# Patient Record
Sex: Male | Born: 1940 | Race: White | Hispanic: No | Marital: Married | Smoking: Former smoker
Health system: Southern US, Community
[De-identification: ages and names within clinical notes are randomized; demographics above are authoritative.]

## PROBLEM LIST (undated history)

## (undated) DIAGNOSIS — I739 Peripheral vascular disease, unspecified: Secondary | ICD-10-CM

## (undated) DIAGNOSIS — G4730 Sleep apnea, unspecified: Secondary | ICD-10-CM

## (undated) DIAGNOSIS — I2510 Atherosclerotic heart disease of native coronary artery without angina pectoris: Secondary | ICD-10-CM

## (undated) DIAGNOSIS — I499 Cardiac arrhythmia, unspecified: Secondary | ICD-10-CM

## (undated) DIAGNOSIS — R251 Tremor, unspecified: Secondary | ICD-10-CM

## (undated) DIAGNOSIS — I10 Essential (primary) hypertension: Secondary | ICD-10-CM

## (undated) DIAGNOSIS — M1990 Unspecified osteoarthritis, unspecified site: Secondary | ICD-10-CM

## (undated) DIAGNOSIS — T7840XA Allergy, unspecified, initial encounter: Secondary | ICD-10-CM

## (undated) DIAGNOSIS — J8410 Pulmonary fibrosis, unspecified: Secondary | ICD-10-CM

## (undated) HISTORY — DX: Unspecified osteoarthritis, unspecified site: M19.90

## (undated) HISTORY — PX: CORONARY ARTERY BYPASS GRAFT: SHX141

## (undated) HISTORY — DX: Essential (primary) hypertension: I10

## (undated) HISTORY — PX: EYE SURGERY: SHX253

## (undated) HISTORY — PX: LEG SURGERY: SHX1003

## (undated) HISTORY — PX: CARDIAC SURGERY: SHX584

## (undated) HISTORY — DX: Allergy, unspecified, initial encounter: T78.40XA

## (undated) HISTORY — PX: TONSILLECTOMY: SUR1361

---

## 1998-12-17 ENCOUNTER — Encounter: Payer: Self-pay | Admitting: Vascular Surgery

## 1998-12-18 ENCOUNTER — Encounter: Payer: Self-pay | Admitting: Vascular Surgery

## 1998-12-18 ENCOUNTER — Inpatient Hospital Stay: Admission: RE | Admit: 1998-12-18 | Discharge: 1998-12-20 | Payer: Self-pay | Admitting: Vascular Surgery

## 2003-07-16 ENCOUNTER — Inpatient Hospital Stay (HOSPITAL_COMMUNITY): Admission: EM | Admit: 2003-07-16 | Discharge: 2003-07-19 | Payer: Self-pay

## 2003-07-17 ENCOUNTER — Encounter: Payer: Self-pay | Admitting: Vascular Surgery

## 2004-02-25 ENCOUNTER — Ambulatory Visit (HOSPITAL_COMMUNITY): Admission: RE | Admit: 2004-02-25 | Discharge: 2004-02-25 | Payer: Self-pay | Admitting: Vascular Surgery

## 2004-07-07 ENCOUNTER — Ambulatory Visit (HOSPITAL_COMMUNITY): Admission: RE | Admit: 2004-07-07 | Discharge: 2004-07-07 | Payer: Self-pay | Admitting: Vascular Surgery

## 2006-05-21 ENCOUNTER — Ambulatory Visit: Payer: Self-pay

## 2006-07-27 ENCOUNTER — Ambulatory Visit: Payer: Self-pay | Admitting: Internal Medicine

## 2007-05-18 ENCOUNTER — Ambulatory Visit: Payer: Self-pay | Admitting: Vascular Surgery

## 2007-12-19 ENCOUNTER — Ambulatory Visit: Payer: Self-pay | Admitting: Internal Medicine

## 2009-05-01 ENCOUNTER — Ambulatory Visit: Payer: Self-pay | Admitting: Vascular Surgery

## 2009-05-07 ENCOUNTER — Ambulatory Visit: Payer: Self-pay | Admitting: Vascular Surgery

## 2010-03-27 ENCOUNTER — Ambulatory Visit: Payer: Self-pay | Admitting: Vascular Surgery

## 2010-04-24 ENCOUNTER — Ambulatory Visit: Payer: Self-pay | Admitting: Vascular Surgery

## 2010-10-05 ENCOUNTER — Ambulatory Visit: Payer: Self-pay | Admitting: Internal Medicine

## 2011-03-02 ENCOUNTER — Ambulatory Visit: Payer: Self-pay | Admitting: Unknown Physician Specialty

## 2011-04-21 NOTE — Procedures (Signed)
BYPASS GRAFT EVALUATION   INDICATION:  Follow-up evaluation of lower extremity bypass graft.   HISTORY:  Diabetes:  Yes.  Cardiac:  No.  Hypertension:  Yes.  Smoking:  Yes.  Previous Surgery:  Left distal SFA to below-knee popliteal artery bypass  graft on 07/17/2003, stent left distal SFA proximal to graft on  07/07/2004.   SINGLE LEVEL ARTERIAL EXAM                               RIGHT              LEFT  Brachial:                    120                135  Anterior tibial:             141                164  Posterior tibial:            129                152  Peroneal:  Ankle/brachial index:        1.04               1.21   PREVIOUS ABI:  Date: 05/19/2006  RIGHT:  1.03  LEFT:  1.12   LOWER EXTREMITY BYPASS GRAFT DUPLEX EXAM:   DUPLEX:  1. Triphasic duplex waveform noted within left superficial femoral to      popliteal artery bypass graft and native arteries.  2. 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   IMPRESSION:  1. Patent left superficial femoral to popliteal artery bypass graft      with no evidence of focal stenosis.  2. Normal bilateral lower extremity ABIs with triphasic and biphasic      Doppler waveforms.  3. A nonvascularized anechoic mass noted behind right knee measuring      1.89 x 2.19 cm, possibly Baker's cyst.   ___________________________________________  Judeth Cornfield. Scot Dock, M.D.   AC/MEDQ  D:  05/02/2009  T:  05/02/2009  Job:  [id number]

## 2011-04-21 NOTE — Assessment & Plan Note (Signed)
OFFICE VISIT   [name], [name]  DOB:  11-02-1941                                       05/18/2007  NATFT#:[id number]   I saw [name] in the office for continued followup of his left lower  extremity bypass graft.  He has an above-knee to below-knee bypass.  His  only complaint today, he has been having some aching pain in the right  leg along the lateral aspect and also some burning pain overlying some  varicose veins.  He has had no real calf or thigh claudication, he has  had no rest pain.  He has no history of non-healing wounds.   REVIEW OF SYSTEMS:  He wears home O2 for sleep apnea.  He has had no  chest pain or chest pressure.  He does have arthritis.   PHYSICAL EXAMINATION:  VITAL SIGNS:  Blood pressure 134/80, heart rate  81.  NECK:  I do not detect any carotid bruits.  LUNGS:  Clear bilaterally to auscultation.  CARDIAC:  He has a regular rate and rhythm.  EXTREMITIES:  He has palpable femoral, popliteal and pedal pulses  bilaterally.  He has hyperpigmentation consistent with chronic venous  insufficiency.  He has some varicose veins bilaterally.   I re-assured him that he has excellent arterial flow and I think his  pain in the right leg is from his chronic venous insufficiency.  We  discussed the importance of intermittent leg elevation and also  considered giving him some compression stockings, although currently,  because of the heat and  all, he was not interested in that.  I will plan to see him back in 1  year.  He knows to call sooner if he has problems.   Judeth Cornfield. Scot Dock, M.D.  Electronically Signed   CSD/MEDQ  D:  05/18/2007  T:  05/19/2007  Job:  [id number]

## 2011-04-21 NOTE — Assessment & Plan Note (Signed)
OFFICE VISIT   TAGG, EUSTICE  DOB:  [birth year]                                       05/07/2009  NGEXB#:[id number]   I saw the patient in the office today for continued followup of his  peripheral vascular disease.  He has undergone a previous bypass graft  in January of 2000 by myself which was a left above knee pop to below  knee pop bypass with a vein graft.  He presented in August of 2004 with  an occluded graft and underwent bypass in the distal superficial femoral  artery on the left to the below knee popliteal artery using vein from  the left leg.  This was by Dr. Donnetta Hutching.  He was last seen in June of 2008  and then got off of the protocol some how with respect to following his  bypass graft.  On 05/01/2009 he had a followup study which showed  triphasic waveforms throughout the superficial femoral artery and  popliteal artery and bypass graft on the left with an ABI of 100%  bilaterally.  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  He  also had a Baker's cyst on the right.   Since I saw him last he has had really no claudication, rest pain or  nonhealing ulcers.  He does have some aching in his leg when he is on  his feet all day likely related to his chronic venous insufficiency.  He  works in [employer] and is on his feet most of the day.   REVIEW OF SYSTEMS:  He has had no recent chest pain, chest pressure,  palpitations or arrhythmias.  He has had no productive cough,  bronchitis, asthma or wheezing.   PHYSICAL EXAMINATION:  This is a pleasant 70 year old gentleman who  appears his stated age.  His blood pressure is 134/80, heart rate is 57.  Neck is supple.  There is no cervical lymphadenopathy.  I do not detect  any carotid bruits.  Lungs are clear bilaterally to auscultation.  On  cardiac exam he has a regular rate and rhythm.  His abdomen is soft and  nontender.  He has palpable femoral, popliteal and pedal pulses  bilaterally.  He has hyperpigmentation bilaterally consistent with  chronic venous insufficiency.   With respect to his aching in his legs I have encouraged him to elevate  his legs when he gets home from work at night.  With respect to his  bypass graft in the left leg which has an aneurysmal area I have  recommend a followup duplex scan in 6 months and I will see him back at  that time.  If this enlarges we would have to consider arteriography and  possible revision although this would essentially mean a whole new  bypass graft.  We will see him back in 6 months.  He knows to call  sooner if he has problems.   Judeth Cornfield. Scot Dock, M.D.  Electronically Signed   CSD/MEDQ  D:  05/07/2009  T:  05/08/2009  Job:  [id number]

## 2011-04-24 NOTE — Op Note (Signed)
[name], [name]                        ACCOUNT NO.:  [id number]   MEDICAL RECORD NO.:  [id number]                   PATIENT TYPE:  OIB   LOCATION:  2899                                 FACILITY:  [facility]   PHYSICIAN:  Judeth Cornfield. Scot Dock, M.D.        DATE OF BIRTH:  24-Sep-1941   DATE OF PROCEDURE:  02/25/2004  DATE OF DISCHARGE:                                 OPERATIVE REPORT   PREOPERATIVE DIAGNOSIS:  Superficial femoral artery occlusive disease.   PROCEDURE:  Aortogram.  Bilateral iliac arteriogram and bilateral lower  extremity runoff.   INDICATIONS:  This is a pleasant 70 year old gentleman who has undergone  previous repair and subsequent revision of a left popliteal artery bypass  graft.  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  On a routine  followup study he was noted to have a slight drop in his ankle-brachial  index on the left from 100% to 90% and there was an area of increased  velocity just proximal to the proximal anastomosis and the superficial  femoral artery.  He was brought in for diagnostic arteriography and possible  angioplasty of the SFA stenosis.   DESCRIPTION OF PROCEDURE:  The patient was brought to the PD lab at [facility] and  sedated with 1 mg of Versed and 50 mcg of fentanyl.  Both groins were  prepped and draped in the usual sterile fashion.  After the skin was  anesthetized with 1% lidocaine, the right common femoral artery was  cannulated and the guide wire introduced into the infrarenal aorta under  fluoroscopic control.  A 5 French sheath was then passed over the wire and  the dilator removed.  The pigtail catheter was positioned at the L1  vertebral body and flush aortogram obtained.  The catheter was then  positioned above the aortic bifurcation and oblique iliac projections were  obtained.  Next, the pigtail catheter was exchanged for an IMA catheter  which  was positioned into the proximal left common iliac artery.  The wooly  wire was advanced down to the left common femoral artery and the IMA  catheter was exchanged for an __________  catheter.  Left lower extremity  runoff films were obtained through the __________  catheter.  In order to  get better visualization of the stenosis, I did advance the catheter over a  wire into the SFA below the common femoral artery bifurcation.  Next, the  catheter was removed and the right lower extremity films were obtained  through the right femoral sheath.   FINDINGS:  The are single renal arteries bilaterally with no significant  renal artery stenosis identified.  There is no significant narrowing of the  infrarenal aorta.  There is one area of mildly dilated right common iliac  artery but this is not frankly aneurysmal.  Common iliac arteries,  hypogastric arteries, and external iliac arteries were widely  patent  bilaterally.   On the left side the common femoral, superficial femoral, and deep femoral  arteries were widely patent.  There was an approximately 50% stenosis that  is irregular over an approximately 2-cm length just proximal to the proximal  anastomosis of the above-knee to below-knee popliteal artery bypass graft.  The vein graft itself is widely patent.  There appears to be a distal  anastomotic stenosis of about 30%.  There is three-vessel runoff on the  left.  On the right side, the common femoral, superficial femoral, and deep  femoral arteries were widely patent.  There was some mild irregularity at  the adductor canal and some slight ectasia of the popliteal artery.  Again,  it is not frankly aneurysmal.  There is three-vessel runoff on the right.   CONCLUSION:  A 50% superficial femoral artery stenosis above the vein bypass  graft on the left leg with a widely patent vein graft and a 30% distal  anastomotic stenosis.                                               Judeth Cornfield. Scot Dock, M.D.    CSD/MEDQ  D:  02/25/2004  T:  02/25/2004  Job:  [id number]

## 2011-04-24 NOTE — Op Note (Signed)
[name], [name]                        ACCOUNT NO.:  [id number]   MEDICAL RECORD NO.:  [id number]                   PATIENT TYPE:  INP   LOCATION:  2041                                 FACILITY:  [facility]   PHYSICIAN:  Rosetta Posner, M.D.                 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   PROCEDURE:  Bypass from distal superficial femoral artery to below knee  popliteal artery with reversed saphenous vein graft, left leg.   SURGEON:  Rosetta Posner, M.D.   ASSISTANT:  Suzzanne Cloud, P.A.  Lydia Guiles, P.A.   ANESTHESIA:  General endotracheal anesthesia.   COMPLICATIONS:  None.   CONDITION:  Stable.   PROCEDURE IN DETAIL:  The patient was taken to the operating room and placed  under general anesthesia where the area of the left groin and left leg were  prepped and draped in the usual sterile fashion.  An incision was made at  the prior below knee popliteal incision via the knee approach and carried  down to isolate the vein graft of the popliteal artery.  The artery was not  aneurysmal at this level.  The artery was opened at the prior anastomosis  and there was clot in the below knee popliteal artery.  This was removed  with the catheter being passed down to the level of the calf.  Excellent  back bleeding was encountered and no further thrombus was removed.  Attempts  were made to thrombectomize the vein graft.  There appeared to be no  stenosis in the vein graft.  For this reason, the decision was made to open  the prior above knee popliteal incision via medial approach and carried down  to  isolate the distal superficial femoral artery which had a good pulse  present.  Next, a groin incision was made and carried down to isolate the  saphenous vein which was of excellent caliber.  The vein was ligated at the  saphenofemoral junction and was exposed through several skin incisions down  to the mid thigh.  The vein was harvested, branches were occluded with 3-0  and 4-0 silk ties, and divided.  The vein was readily dilated with  heparinized saline and was of excellent caliber.  The patient was given 8000  units of intravenous  heparin prior to opening the below knee popliteal  artery.  The superficial femoral artery was ligated distally and occluded  proximally with a vascular clamp.  The artery was transected and the vein  was reversed and sewn end-to-end to  the artery with a running 5-0 Prolene  suture.  The anastomosis was tested and found to be adequate.  The graft was  then brought through a tunnel behind the level of the knee to the level of  the below knee popliteal artery.  The artery was transected and the vein was  cut to the appropriate length and sewn end-to-end to the artery with a  running 5-0 Prolene suture.  Prior to completion of the anastomosis, the  usual flush maneuvers were undertaken.  The anastomosis was then completed  and an excellent Doppler flow was noted in the foot.  The wounds were  irrigated with saline, hemostasis was obtained with  electrocautery.  The patient was given 50 mg protamine to reverse the  heparin.  The wounds were closed with 3-0 Vicryl in the subcutaneous and  subcuticular tissue, the skin was closed with skin clips.  A sterile  dressing was applied.  The patient was taken to the recovery room in stable  condition.                                               Rosetta Posner, M.D.    TFE/MEDQ  D:  07/17/2003  T:  07/17/2003  Job:  [id number]

## 2011-04-24 NOTE — Op Note (Signed)
NAMECAMRAN, [name]                        ACCOUNT NO.:  [id number]   MEDICAL RECORD NO.:  [id number]                   PATIENT TYPE:  OIB   LOCATION:  2899                                 FACILITY:  [facility]   PHYSICIAN:  Judeth Cornfield. Scot Dock, M.D.        DATE OF BIRTH:  02/19/1941   DATE OF PROCEDURE:  07/07/2004  DATE OF DISCHARGE:                                 OPERATIVE REPORT   OPERATION PERFORMED:  1. Bilateral lower extremity arteriograms.  2. Angioplasty of left superficial femoral artery with Powerflex 5 x 2     balloon.  3. Placement of 8 x 40 smart stent in the left superficial femoral artery.   SURGEON:  Judeth Cornfield. Scot Dock, M.D.   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  He had presented with  an acutely ischemic leg in January of 2000.  In August of 2004, the graft  occluded and he had it replaced with another segment of vein from the left  leg.  He has been followed since then and he has developed a stenosis  proximal to his graft.  The velocities within the stenoses have increased  and peak systolic velocity is now 992 cm per second.  Given the risk for  progression and graft thrombosis related to the proximal stenosis,  arteriography and possible angioplasty were recommended.  The procedure and  potential complications including but not limited to bleeding, arterial  injury, dye reaction, kidney failure, arterial dissection and restenosis  were discussed with the patient and all of his questions were answered and  he was agreeable to proceed.   ASSISTANT:  Nurse.   ANESTHESIA:   DESCRIPTION OF PROCEDURE:  The patient was taken to the peripheral vascular  lab at [facility] and sedated with 1 mg of Versed and 50 mcg of  fentanyl.  Both groins were prepped and draped in the usual sterile fashion.  After the skin was infiltrated with 1% lidocaine, the left  common femoral  artery was cannulated and a guidewire introduced into the infrarenal aorta.  The 5 French sheath was passed over the wire.  An IMA catheter was then  positioned into the proximal left common iliac artery, then the [facility] wire  was advanced down into the common femoral artery on the left.  The left  lower extremity runoff film was obtained demonstrating the tight stenosis  proximal to the popliteal to popliteal artery bypass graft and the mid-SFA.  I then exchanged the 5 [place] sheath for a 6 [place] Terumo sheath.  The  patient then received 4000 units if IV heparin.  The [facility] wire was then  advanced down to the stenosis and would not cross the stenosis.  I had to  use an angled Glide wire in order to traverse the stenosis in the left  superficial femoral artery.  Once I was past the stenosis,  repeat  arteriogram was obtained through the Terumo sheath using a [place]  marker.  The stenosis was then ballooned with a 5 x 2 PowerFlex balloon 14  atmospheres for 30 seconds and then 15 atmospheres for 30 seconds.  There  remained a residual waist despite this.  Therefore, given the residual  stenosis, I elected to place an 8 x 40 Smart stent.  The stent was  positioned across the stenosis after the balloon had been removed.  This was  a self expanding stent.  After this was placed, I went back with an 8 x 4  PowerFlex balloon to balloon within the stent and I did 6 atmospheres for 30  seconds and then eight atmospheres for 30 seconds with essentially no  residual stenosis.  Completion arteriogram was obtained.   FINDINGS:  The common femoral, superficial femoral and deep femoral artery  patent on the right side.  The popliteal artery was slightly ectatic but is  not frankly aneurysmal.  There was three vessel runoff on the right via the  anterior tibial, posterior tibial and peroneal arteries.  On the left side  the common femoral and deep femoral arteries were widely  patent.  The  proximal superficial femoral artery was patent.  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  The stenosis was successfully balloon  angioplastied as described above, runoff on the left demonstrates three  vessel runoff via the anterior tibial, posterior tibial and peroneal  arteries.  There is a very minimal 5 to 10% stenosis distal to the distal  anastomosis on the left side.  This did not appear to be significant.   CONCLUSION:  Successful stenting and angioplasty of the left superficial  femoral artery above the superficial femoral artery to popliteal artery  bypass graft.  No significant tibial occlusive disease bilaterally.                                               Judeth Cornfield. Scot Dock, M.D.    CSD/MEDQ  D:  07/07/2004  T:  07/07/2004  Job:  [id number]

## 2011-04-24 NOTE — H&P (Signed)
[name], [name]                        ACCOUNT NO.:  [id number]   MEDICAL RECORD NO.:  [id number]                   PATIENT TYPE:  INP   LOCATION:  2041                                 FACILITY:  [facility]   PHYSICIAN:  Rosetta Posner, M.D.                 DATE OF BIRTH:  07-10-1941   DATE OF ADMISSION:  07/16/2003  DATE OF DISCHARGE:                                HISTORY & PHYSICAL   CHIEF COMPLAINT:  Cold left foot.   HISTORY OF PRESENT ILLNESS:  The patient is a 70 year old white male with a  history of peripheral vascular disease.  He is well-known to [facility], as he has  undergone a previous left above-knee-to-below-knee popliteal bypass graft in  January of 2000 by Dr. Judeth Cornfield. Scot Dock for a left popliteal artery  aneurysm.  Around 5 p.m. last evening, he developed the acute onset of  heaviness in his left foot as well as coolness.  He has had some  intermittent numbness as well.  He denies specific pain, change in sensory  or motor function, rest pain, hip or thigh pain. He recalls that over the  past several weeks, he has had some cramping in his left calf with exertion  such as walking.  He has continued to have the heaviness and coolness  overnight and this morning, sought care at [facility] in [place].  On exam, he was found to have a cold left foot.  He was transferred to [facility] for further evaluation and treatment.  He was seen in the  emergency department by Dr. Arvilla Meres. Early and ankle brachial indices were  performed, which were greater than 1.0 on the right and 0.64 on the left.  It was noted that on his last visit with Dr. Scot Dock in the [place] office in  March of 2004, a Duplex study at that time showed an ABI of greater than 1.0  on the left and Duplex scanning of the graft showed that the graft was  patent with slight aneurysmal dilatation proximal to the graft.  Because of  these reasons, he was admitted for further evaluation and  treatment.   PAST MEDICAL HISTORY:  1. Hypertension.  2. Nasal polyps.  3. History of an acute left femoral thrombosis in January of 2000 while he     was in [place].  He was admitted to [facility] at that time     for thrombectomy.  It was found at that time that he had a left popliteal     artery aneurysm and he was seen by Dr. Scot Dock and treated for this     problem.   PAST SURGICAL HISTORY:  1. Left femoral thrombectomy in [place] in 2000.  2. Left above-knee-to-below-knee popliteal bypass graft with reversed     saphenous vein to repair a left popliteal artery aneurysm in January of     2000  by Dr. Scot Dock.  3. Nasal polypectomy in May of 2004.  4. Tonsillectomy as a child.   CURRENT MEDICATIONS:  (He does not have his medications with him, therefore,  the doses are not documented.)  1. Lotensin.  2. Toprol.  3. Hydrochlorothiazide.  4. Enteric-coated aspirin 81 mg daily.  5. Vitamin E.  6. Saw palmetto.   ALLERGIES:  He reports allergies to IODINE and SHELLFISH.  He also has an  allergy to IV CONTRAST, which causes swelling.   FAMILY HISTORY:  His father died of lung cancer.  He also had coronary  artery disease and diabetes mellitus.  He denies any other family history of  coronary artery disease, hypertension, diabetes, strokes, cancer, or COPD.   SOCIAL HISTORY:  He is married and lives in [place].  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  He previously  smoked 2 packs per day and smoked for 30 years.  He quit 18 years ago.  He  does report occasional alcohol use.   REVIEW OF SYSTEMS:  See history of present illness for pertinent positives  and negatives.  Also, he reports feeling tired a lot recently.  He has a  history of nasal polyps but had these removed in May and has done much  better.  He denies fevers, chills, weight loss, recent infections, TIA  symptoms, amaurosis fugax, dysphagia, syncope, weakness,  chest pain, heart  palpitations, shortness of breath, dyspnea on exertion, orthopnea,  paroxysmal nocturnal dyspnea, cough, abdominal pain, nausea, vomiting,  diarrhea, constipation, hematochezia, melena, dysuria, nocturia, hematuria,  lower extremity edema, anxiety, depression, intolerance to heat or cold.   PHYSICAL EXAMINATION:  VITAL SIGNS:  Blood pressure 118/73, pulse of 62 and  regular, respirations 20 and unlabored, temperature 97.3.  GENERAL:  This is a well-developed, well-nourished white male in no acute  distress.  HEENT:  Normocephalic, atraumatic.  Pupils are equal, round and reactive to  light and accommodation.  Extraocular movements intact.  Exam of the ears  and nose reveals no abnormalities.  Oropharynx is clear with upper dentures  in place.  NECK:  Neck supple without lymphadenopathy, thyromegaly or carotid bruits.  HEART:  Regular rate and rhythm without murmurs, rubs, or gallops.  LUNGS:  Lungs clear to auscultation.  ABDOMEN:  Abdomen soft, obese, nontender and nondistended with active bowel  sounds in all quadrants.  No masses or hepatosplenomegaly.  EXTREMITIES:  His left foot is slightly cooler than the right.  There is no  obvious ischemia or ulcerations.  His foot has sensation and motor is  intact.  He has diminished pulses on the left and barely palpable dorsalis  pedis pulse.  He does have Dopplers in the DP and PT on the left.  His right  dorsalis pedis is 2+, palpable, and 2+ PT, which is palpable.  His femoral  pulses are 1+ and symmetrical bilaterally.  His radial pulses are 3+ and  symmetrical.  He has a well-healed left above-knee-to-below-knee scar on the  left.  NEUROLOGIC:  Cranial nerves II-XII grossly intact.  GENITAL AND RECTAL:  Exams deferred.   LABORATORIES ON ADMISSION:  White blood count 7.4, hemoglobin 14.3,  hematocrit 41.5, platelets 166,000.  Sodium 139, potassium 3.7, BUN 15, creatinine 0.9, glucose 104.  PT 13.8, INR 1.1, PTT 33.   ABIs greater than  1.0 on the right and 0.64 on the left.   ASSESSMENT AND PLAN:  This is a 70 year old white male with  history of  peripheral vascular disease, who presents with an ischemic left lower  extremity.  Because of his history of contrast allergy, he will be  premedicated  tonight and will undergo angiography in the morning to further delineate his  anatomy.  He will possibly need to be taken to the operating room later in  the day for revascularization.  He will be started on intravenous heparin  this evening.      Suzzanne Cloud, P.A.                        Rosetta Posner, M.D.    GC/MEDQ  D:  07/16/2003  T:  07/17/2003  Job:  [id number]   cc:   Georgetta Haber  [address]  [place] [address]  Fax: [phone]

## 2011-04-24 NOTE — Discharge Summary (Signed)
NAMESHELLIE, GOETTL                        ACCOUNT NO.:  [id number]   MEDICAL RECORD NO.:  [id number]                   PATIENT TYPE:  INP   LOCATION:  2010                                 FACILITY:  [facility]   PHYSICIAN:  Rosetta Posner, M.D.                 DATE OF BIRTH:  03-Apr-1941   DATE OF ADMISSION:  07/16/2003  DATE OF DISCHARGE:  07/19/2003                                 DISCHARGE SUMMARY   PRINCIPAL ADMISSION DIAGNOSIS:  Ischemic left foot.   DISCHARGE DIAGNOSES:  1. Ischemic left foot.  2. Occluded left saphenous vein bypass from above-knee to below-knee     popliteal artery from prior aneurysm repair.  3. Hypertension.  4. History of nasal polyps.  5. History of acute left femoral thrombosis in 2001.  6. History of tobacco use.   PROCEDURES:  1. Aortogram with bilateral lower extremity runoff.  2. Left-superficial-femoral-artery-to-below-knee-popliteal-artery bypass     with reverse saphenous vein graft.   HISTORY:  The patient is a 70 year old white male who is well known to [facility].  He had previously undergone a left above-knee-to-below-knee-popliteal bypass  graft in January 2000 by Dr. Scot Dock for repair of popliteal artery  aneurysm.  On the day prior to admission, he developed acute onset heaviness  of his left foot with associated temperature change.  He was seen the  following morning at the [facility] and was found to have ischemic left  foot.  He was transferred to [facility] for further evaluation.   HOSPITAL COURSE:  On admission, he was noted to have a left ankle brachial  index of 0.64 with ischemic changes of his left toes.  He was seen by Dr.  Donnetta Hutching, and it was felt that his previous bypass was most likely occluded.  It was recommended he proceed with an arteriogram.  However, because of  history of significant contrast allergy, it was felt that he was stable  enough to premedicate and proceed the following morning with  arteriography.  The underwent aortogram with bilateral lower extremity runoff on August 10  and was found to have an occlusion of the left distal  superficial femoral  to popliteal artery.  Because of this, he was later taken to the operating  room that day by Dr. Donnetta Hutching and underwent femoral-to-below-knee-popliteal  bypass graft.  He tolerated the procedure well and was transferred to the  floor in stable condition.  Postoperatively, he has done well.  His ankle  brachial index has improved to greater than 1.0 on the left.  He has been  ambulating the halls with a rolling walker and has been doing very well.  He  has remained afebrile, and all vital signs have been stable.  He has a  strong dorsalis pedis pulse in his foot, pink, and well perfused.  It is  felt that he is ambulating well and has had no other problems  postoperatively, and he may be discharged home at this time.   DISCHARGE MEDICATIONS:  1. Tylox 1 to 2 q.4h. p.r.n. for pain.  2. Home dose Lotensin, Toprol, hydrochlorothiazide, enteric-coated aspirin,     vitamin E, and Saw Palmetto.   DISCHARGE INSTRUCTIONS:  1. He is to refrain from driving,  heavy lifting, or strenuous activity.  2. He may continue daily walking and use of incentive spirometer.  3. He may shower daily and clean his incisions with soap and water.  4. He will continue same preoperative diet.   FOLLOW UP:  He will return to [facility] office in one week for staple removal and  wound recheck.  He will then follow up with Dr. Donnetta Hutching in three weeks and  will ahve repeat ankle brachial indices at that time.  He is asked to call  in the interim if he experiences any problems or has questions.      Suzzanne Cloud, P.A.                        Rosetta Posner, M.D.    GC/MEDQ  D:  07/19/2003  T:  07/20/2003  Job:  [id number]   cc:   Georgetta Haber, M.D.  [employer]

## 2011-06-17 ENCOUNTER — Ambulatory Visit: Payer: Self-pay | Admitting: Internal Medicine

## 2012-01-11 ENCOUNTER — Encounter: Payer: Self-pay | Admitting: Sports Medicine

## 2012-01-19 ENCOUNTER — Inpatient Hospital Stay: Payer: Self-pay | Admitting: Vascular Surgery

## 2012-01-19 LAB — CREATININE, SERUM
Creatinine: 1.08 mg/dL (ref 0.60–1.30)
EGFR (African American): >60
EGFR (Non-African Amer.): >60

## 2012-01-19 LAB — CBC WITH DIFFERENTIAL/PLATELET
Basophil #: 0 10*3/uL (ref 0.0–0.1)
Basophil %: 0.1 %
Eosinophil #: 0 10*3/uL (ref 0.0–0.7)
Eosinophil %: 0 %
HCT: 42.5 % (ref 40.0–52.0)
HGB: 13.9 g/dL (ref 13.0–18.0)
Lymphocyte #: 0.7 10*3/uL — ABNORMAL LOW (ref 1.0–3.6)
Lymphocyte %: 8.1 %
MCH: 29.7 pg (ref 26.0–34.0)
MCHC: 32.7 g/dL (ref 32.0–36.0)
MCV: 91 fL (ref 80–100)
Monocyte #: 0.1 10*3/uL (ref 0.0–0.7)
Monocyte %: 1.6 %
Neutrophil #: 7.3 10*3/uL — ABNORMAL HIGH (ref 1.4–6.5)
Neutrophil %: 90.2 %
Platelet: 192 10*3/uL (ref 150–440)
RBC: 4.68 10*6/uL (ref 4.40–5.90)
RDW: 14.3 % (ref 11.5–14.5)
WBC: 8.1 10*3/uL (ref 3.8–10.6)

## 2012-01-19 LAB — BUN: BUN: 15 mg/dL (ref 7–18)

## 2012-01-20 LAB — CBC WITH DIFFERENTIAL/PLATELET
Basophil #: 0 10*3/uL (ref 0.0–0.1)
Basophil %: 0.1 %
Eosinophil #: 0 10*3/uL (ref 0.0–0.7)
Eosinophil %: 0 %
HCT: 38.1 % — ABNORMAL LOW (ref 40.0–52.0)
HGB: 12.6 g/dL — ABNORMAL LOW (ref 13.0–18.0)
Lymphocyte #: 1.1 10*3/uL (ref 1.0–3.6)
Lymphocyte %: 10.3 %
MCH: 29.8 pg (ref 26.0–34.0)
MCHC: 33 g/dL (ref 32.0–36.0)
MCV: 90 fL (ref 80–100)
Monocyte #: 0.7 10*3/uL (ref 0.0–0.7)
Monocyte %: 6.4 %
Neutrophil #: 8.6 10*3/uL — ABNORMAL HIGH (ref 1.4–6.5)
Neutrophil %: 83.2 %
Platelet: 172 10*3/uL (ref 150–440)
RBC: 4.21 10*6/uL — ABNORMAL LOW (ref 4.40–5.90)
RDW: 14.6 % — ABNORMAL HIGH (ref 11.5–14.5)
WBC: 10.3 10*3/uL (ref 3.8–10.6)

## 2012-01-20 LAB — BASIC METABOLIC PANEL
Anion Gap: 10 (ref 7–16)
Calcium, Total: 8.4 mg/dL — ABNORMAL LOW (ref 8.5–10.1)
Chloride: 104 mmol/L (ref 98–107)
Co2: 25 mmol/L (ref 21–32)
EGFR (African American): >60
Glucose: 155 mg/dL — ABNORMAL HIGH (ref 65–99)
Osmolality: 285 (ref 275–301)

## 2012-01-20 LAB — PROTIME-INR
INR: 1.2
Prothrombin Time: 15.8 secs — ABNORMAL HIGH (ref 11.5–14.7)

## 2012-01-20 LAB — CK TOTAL AND CKMB (NOT AT ARMC): CK-MB: 10 ng/mL — ABNORMAL HIGH (ref 0.5–3.6)

## 2012-01-20 LAB — APTT: Activated PTT: >160 secs (ref 23.6–35.9)

## 2012-01-21 LAB — CBC WITH DIFFERENTIAL/PLATELET
Basophil %: 0.2 %
Eosinophil #: 0.2 10*3/uL (ref 0.0–0.7)
Eosinophil %: 1.2 %
HGB: 12.6 g/dL — ABNORMAL LOW (ref 13.0–18.0)
MCV: 91 fL (ref 80–100)
Monocyte #: 1.2 10*3/uL — ABNORMAL HIGH (ref 0.0–0.7)
Monocyte %: 8.7 %
Neutrophil #: 8.6 10*3/uL — ABNORMAL HIGH (ref 1.4–6.5)
Neutrophil %: 64.3 %
Platelet: 158 10*3/uL (ref 150–440)
RBC: 4.23 10*6/uL — ABNORMAL LOW (ref 4.40–5.90)
RDW: 14.7 % — ABNORMAL HIGH (ref 11.5–14.5)
WBC: 13.3 10*3/uL — ABNORMAL HIGH (ref 3.8–10.6)

## 2012-01-21 LAB — BASIC METABOLIC PANEL
Calcium, Total: 8 mg/dL — ABNORMAL LOW (ref 8.5–10.1)
Chloride: 106 mmol/L (ref 98–107)
Co2: 26 mmol/L (ref 21–32)
Creatinine: 0.8 mg/dL (ref 0.60–1.30)
EGFR (African American): >60
EGFR (Non-African Amer.): >60
Glucose: 115 mg/dL — ABNORMAL HIGH (ref 65–99)
Osmolality: 288 (ref 275–301)

## 2012-01-21 LAB — APTT: Activated PTT: >160 secs (ref 23.6–35.9)

## 2012-01-22 LAB — CBC WITH DIFFERENTIAL/PLATELET
Basophil #: 0 10*3/uL (ref 0.0–0.1)
Basophil %: 0.4 %
Eosinophil %: 3 %
HGB: 11.8 g/dL — ABNORMAL LOW (ref 13.0–18.0)
Lymphocyte #: 2.5 10*3/uL (ref 1.0–3.6)
MCH: 29.7 pg (ref 26.0–34.0)
MCV: 89 fL (ref 80–100)
Monocyte %: 10 %
Neutrophil %: 61.1 %
Platelet: 144 10*3/uL — ABNORMAL LOW (ref 150–440)
RBC: 3.96 10*6/uL — ABNORMAL LOW (ref 4.40–5.90)
WBC: 9.9 10*3/uL (ref 3.8–10.6)

## 2012-01-22 LAB — BASIC METABOLIC PANEL
BUN: 15 mg/dL (ref 7–18)
Chloride: 101 mmol/L (ref 98–107)
EGFR (African American): >60
Potassium: 4 mmol/L (ref 3.5–5.1)
Sodium: 137 mmol/L (ref 136–145)

## 2012-01-22 LAB — PROTIME-INR
INR: 1.2
Prothrombin Time: 15.6 secs — ABNORMAL HIGH (ref 11.5–14.7)

## 2012-01-22 LAB — APTT: Activated PTT: 92.1 secs — ABNORMAL HIGH (ref 23.6–35.9)

## 2012-01-23 LAB — BASIC METABOLIC PANEL
BUN: 18 mg/dL (ref 7–18)
Calcium, Total: 7.9 mg/dL — ABNORMAL LOW (ref 8.5–10.1)
Chloride: 102 mmol/L (ref 98–107)
Creatinine: 1 mg/dL (ref 0.60–1.30)
EGFR (African American): >60
EGFR (Non-African Amer.): >60
Glucose: 111 mg/dL — ABNORMAL HIGH (ref 65–99)
Osmolality: 284 (ref 275–301)
Potassium: 4 mmol/L (ref 3.5–5.1)
Sodium: 141 mmol/L (ref 136–145)

## 2012-01-23 LAB — APTT: Activated PTT: 77.9 secs — ABNORMAL HIGH (ref 23.6–35.9)

## 2012-01-23 LAB — CBC WITH DIFFERENTIAL/PLATELET
Basophil %: 0.4 %
Eosinophil %: 3.4 %
HCT: 31.7 % — ABNORMAL LOW (ref 40.0–52.0)
HGB: 10.5 g/dL — ABNORMAL LOW (ref 13.0–18.0)
Lymphocyte %: 26.9 %
MCH: 29.9 pg (ref 26.0–34.0)
Monocyte #: 1 10*3/uL — ABNORMAL HIGH (ref 0.0–0.7)
Monocyte %: 9.6 %
Neutrophil %: 59.7 %
Platelet: 135 10*3/uL — ABNORMAL LOW (ref 150–440)
RBC: 3.51 10*6/uL — ABNORMAL LOW (ref 4.40–5.90)
WBC: 10.5 10*3/uL (ref 3.8–10.6)

## 2012-01-24 LAB — COMPREHENSIVE METABOLIC PANEL
Anion Gap: 10 (ref 7–16)
Bilirubin,Total: 0.6 mg/dL (ref 0.2–1.0)
Calcium, Total: 8.3 mg/dL — ABNORMAL LOW (ref 8.5–10.1)
Chloride: 102 mmol/L (ref 98–107)
Co2: 27 mmol/L (ref 21–32)
Creatinine: 0.83 mg/dL (ref 0.60–1.30)
EGFR (African American): >60
EGFR (Non-African Amer.): >60
Osmolality: 280 (ref 275–301)
Potassium: 4.1 mmol/L (ref 3.5–5.1)
SGPT (ALT): 40 U/L
Sodium: 139 mmol/L (ref 136–145)

## 2012-01-24 LAB — CBC WITH DIFFERENTIAL/PLATELET
Basophil #: 0 10*3/uL (ref 0.0–0.1)
Eosinophil #: 0.3 10*3/uL (ref 0.0–0.7)
HCT: 30.7 % — ABNORMAL LOW (ref 40.0–52.0)
HGB: 10.2 g/dL — ABNORMAL LOW (ref 13.0–18.0)
Lymphocyte %: 17.5 %
MCHC: 33.1 g/dL (ref 32.0–36.0)
Monocyte #: 0.8 10*3/uL — ABNORMAL HIGH (ref 0.0–0.7)
Monocyte %: 9.4 %
Neutrophil #: 5.6 10*3/uL (ref 1.4–6.5)
RDW: 14.5 % (ref 11.5–14.5)
WBC: 8.1 10*3/uL (ref 3.8–10.6)

## 2012-01-25 LAB — APTT: Activated PTT: 78 secs — ABNORMAL HIGH (ref 23.6–35.9)

## 2012-01-25 LAB — PROTIME-INR: Prothrombin Time: 13.5 secs (ref 11.5–14.7)

## 2012-01-26 LAB — CBC WITH DIFFERENTIAL/PLATELET
Basophil #: 0 10*3/uL (ref 0.0–0.1)
Basophil %: 0.2 %
Eosinophil #: 0.4 10*3/uL (ref 0.0–0.7)
Eosinophil %: 3.7 %
Lymphocyte #: 3.1 10*3/uL (ref 1.0–3.6)
Lymphocyte %: 29 %
MCHC: 32.8 g/dL (ref 32.0–36.0)
MCV: 91 fL (ref 80–100)
Monocyte #: 0.9 10*3/uL — ABNORMAL HIGH (ref 0.0–0.7)
Monocyte %: 8.6 %
Neutrophil #: 6.3 10*3/uL (ref 1.4–6.5)
Platelet: 190 10*3/uL (ref 150–440)
RBC: 3.61 10*6/uL — ABNORMAL LOW (ref 4.40–5.90)
RDW: 15.5 % — ABNORMAL HIGH (ref 11.5–14.5)
WBC: 10.7 10*3/uL — ABNORMAL HIGH (ref 3.8–10.6)

## 2012-01-26 LAB — APTT: Activated PTT: 62.2 secs — ABNORMAL HIGH (ref 23.6–35.9)

## 2012-01-26 LAB — PROTIME-INR
INR: 1.1
Prothrombin Time: 14.9 secs — ABNORMAL HIGH (ref 11.5–14.7)

## 2012-01-27 LAB — CBC WITH DIFFERENTIAL/PLATELET
Basophil #: 0 10*3/uL (ref 0.0–0.1)
Basophil %: 0.4 %
Eosinophil #: 0.4 10*3/uL (ref 0.0–0.7)
HCT: 32.3 % — ABNORMAL LOW (ref 40.0–52.0)
Lymphocyte #: 2.6 10*3/uL (ref 1.0–3.6)
Lymphocyte %: 26.4 %
MCH: 30.3 pg (ref 26.0–34.0)
MCHC: 33.6 g/dL (ref 32.0–36.0)
MCV: 90 fL (ref 80–100)
Monocyte #: 1.1 10*3/uL — ABNORMAL HIGH (ref 0.0–0.7)
Neutrophil #: 5.8 10*3/uL (ref 1.4–6.5)
Platelet: 207 10*3/uL (ref 150–440)
RDW: 15.2 % — ABNORMAL HIGH (ref 11.5–14.5)

## 2012-01-27 LAB — PROTIME-INR: Prothrombin Time: 17.4 secs — ABNORMAL HIGH (ref 11.5–14.7)

## 2012-01-27 LAB — APTT: Activated PTT: 84.2 secs — ABNORMAL HIGH (ref 23.6–35.9)

## 2012-01-28 LAB — BASIC METABOLIC PANEL
Anion Gap: 9 (ref 7–16)
Calcium, Total: 8.7 mg/dL (ref 8.5–10.1)
Chloride: 103 mmol/L (ref 98–107)
Co2: 26 mmol/L (ref 21–32)
EGFR (African American): >60
Glucose: 110 mg/dL — ABNORMAL HIGH (ref 65–99)
Osmolality: 279 (ref 275–301)
Sodium: 138 mmol/L (ref 136–145)

## 2012-01-28 LAB — APTT: Activated PTT: 96.7 secs — ABNORMAL HIGH (ref 23.6–35.9)

## 2012-01-28 LAB — PROTIME-INR
INR: 1.7
Prothrombin Time: 20.3 secs — ABNORMAL HIGH (ref 11.5–14.7)

## 2012-01-28 LAB — PLATELET COUNT: Platelet: 209 10*3/uL (ref 150–440)

## 2012-01-29 LAB — PROTIME-INR: INR: 1.9

## 2012-01-29 LAB — APTT: Activated PTT: >160 secs (ref 23.6–35.9)

## 2012-11-05 ENCOUNTER — Ambulatory Visit: Payer: Self-pay

## 2012-11-24 ENCOUNTER — Encounter: Payer: Self-pay | Admitting: Vascular Surgery

## 2012-12-15 ENCOUNTER — Ambulatory Visit: Payer: Self-pay | Admitting: Ophthalmology

## 2012-12-21 ENCOUNTER — Ambulatory Visit: Payer: Self-pay | Admitting: Ophthalmology

## 2015-02-12 DIAGNOSIS — G4733 Obstructive sleep apnea (adult) (pediatric): Secondary | ICD-10-CM | POA: Insufficient documentation

## 2015-02-12 DIAGNOSIS — I340 Nonrheumatic mitral (valve) insufficiency: Secondary | ICD-10-CM | POA: Insufficient documentation

## 2015-02-26 DIAGNOSIS — M1711 Unilateral primary osteoarthritis, right knee: Secondary | ICD-10-CM | POA: Insufficient documentation

## 2015-02-26 DIAGNOSIS — M25561 Pain in right knee: Secondary | ICD-10-CM | POA: Insufficient documentation

## 2015-03-29 NOTE — Op Note (Signed)
PATIENT NAME:  [name], KORN MR#:  [id number] DATE OF BIRTH:  10-07-1941  DATE OF PROCEDURE:  12/21/2012  PROCEDURES PERFORMED: 1.  Pars plana vitrectomy of the left eye.  2.  Internal limiting membrane peel of the left eye.  3.  Phacoemulsification and intraocular lens insertion of the left eye.   PREOPERATIVE DIAGNOSES: 1.  Epiretinal membrane.  2.  Retinal edema.  3.  Visually significant cataract left eye.   POSTOPERATIVE DIAGNOSIS:  1.  Epiretinal membrane.  2.  Retinal edema.  3.  Visually significant cataract left eye.   ESTIMATED BLOOD LOSS: Less than 1 mL.  PRIMARY SURGEON:  Coralee Rud, M.D.   ANESTHESIA: Retrobulbar block of the left eye with monitored anesthesia care.   COMPLICATIONS: None.   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 The patient has developed a very significant epiretinal membrane and return of his edema despite maximum treatment. Through the course of treatment, the patient has also developed a visually significant cataract of the left eye. Risks, benefits and alternatives of the above procedure were discussed and the patient wished to proceed.   DETAILS OF PROCEDURE: After informed consent obtained, the patient was brought to the operative suite at [facility]. The patient was placed in supine position, was given a small dose of Alfenta and a retrobulbar block was performed on the left eye by the primary surgeon without complications. The left eye was prepped and draped in sterile manner. After lid speculum was inserted, a side-port wound was created at approximately 1:30. DisCoVisc was injected in the anterior chamber in order to maintain it. A keratome blade was used to create a main corneal wound at approximately 12:00 o'clock. A cystotome was used to create a continuous 360 degree anterior capsulorrhexis  without complication. The lens was hydrodissected using BSS. The lens was rotated for 180 degrees.  The phacoemulsification wand was introduced and the lens was cracked into 4 quadrants and removed without complication. INA was used to remove the remnant cortical material. DisCoVisc was injected into the capsular bag. A 22 diopter Technis ZCB00 lens, serial [id number], was injected into the capsular bag and rotated into position.  The DisCoVisc was removed using INA. A 10-0 nylon stitch was placed at the main corneal wound and the knot was rotated into position. The side-port wound was hydrated and the wounds were noted to be watertight and attention was turned to the pars plana vitrectomy portion of the case.   A 25-gauge trocar was placed inferotemporally through displaced conjunctiva in an oblique fashion 3 mm beyond the limbus. The infusion cannula was turned on and inserted through the trocar and secured into position with Steri-Strips. Two more trocars were placed in a similar fashion superotemporally and superonasally. Vitreous cutter and light pipe were introduced into the eye and a core vitrectomy was performed. The vitreous base was elevated off of the optic nerve using suction and the vitreous base was removed and the peripheral vitreous was trimmed for 360 degrees.  Indocyanine green was injected onto the posterior pole and removed within 30 seconds. The epiretinal membrane and internal limiting membrane were removed in toto for 360 degrees around the fovea. A scleral depressed exam was then performed for 360 degrees.  No signs of any breaks, tears or retinal detachment could be identified for 360 degrees.  A partial air-fluid exchange was performed and  the trocars were removed. The wounds were noted to be  airtight. 5 mg of dexamethasone was given into the inferior fornix. The pressure in the eye was confirmed to be approximately 10 to 15 mmHg. The lid speculum was removed and the eye was cleaned.  TobraDex was placed in the eye and a patch and shield were placed over the eye. The patient was taken to postanesthesia care with instructions to remain head up.      ____________________________ Teresa Pelton. Starling Manns, MD mfa:ct D: 12/21/2012 12:10:39 ET T: 12/21/2012 12:44:34 ET JOB#: [id number]  cc: Teresa Pelton. Starling Manns, MD, <Dictator> Coralee Rud MD ELECTRONICALLY SIGNED 12/28/2012 9:59

## 2015-03-31 NOTE — Op Note (Signed)
PATIENT NAME:  [name], [name] MR#:  [id number] DATE OF BIRTH:  01/18/1941  DATE OF PROCEDURE:  01/19/2012  PREOPERATIVE DIAGNOSES:  1. Atherosclerotic occlusive disease bilateral lower extremities with rest pain of the left lower extremity.  2. Thrombosis of left femoral popliteal bypass graft with ischemic left foot.  3. Popliteal artery aneurysm.  4. Abdominal aortic aneurysm.  5. Central venous stenosis.   POSTOPERATIVE DIAGNOSES: 1. Atherosclerotic occlusive disease bilateral lower extremities with rest pain of the left lower extremity.  2. Thrombosis of left femoral popliteal bypass graft with ischemic left foot.  3. Popliteal artery aneurysm.  4. Abdominal aortic aneurysm.  5. Central venous stenosis.   PROCEDURES PERFORMED: 1. Follow-up angiography through existing sheath.  2. Third order catheter placement with placement of catheter in the anterior tibial artery.  3. Third order catheter placement additional with placement of catheter into the posterior tibial artery.  4. Third order catheter placement with placement of catheter in the peroneal artery.  5. Percutaneous transluminal angioplasty of the peroneal artery to 3 mm.  6. Percutaneous transluminal angioplasty of the posterior tibial artery to 3 mm.  7. Percutaneous transluminal angioplasty of the superficial femoral and vein bypass graft to 8 mm.   PROCEDURE PERFORMED BY: Katha Cabal, MD    SEDATION: Versed 2 mg plus fentanyl 50 mcg administered IV. Continuous ECG, pulse oximetry, and cardiopulmonary monitoring was performed throughout the entire procedure by the interventional radiology nurse. Total sedation time was approximately 1.5 hours.   CONTRAST USED: Isovue 50 mL.   FLUORO TIME: Approximately 10 minutes.   INDICATIONS: Mr. Rusher is a 74 year old gentleman who presented with thrombosis of his fem-pop bypass graft and ischemia of the left lower extremity. Earlier in the day he underwent initial  attempts at salvage which were unsuccessful and he was initiated on TPA infusion. He has now been undergoing infusion at 1.5 mg of TPA per hour for approximately four hours and is being returned to Special Procedures for follow-up examination and potential intervention. Risks and benefits were reviewed. All questions answered. The patient agrees to proceed.   DESCRIPTION OF PROCEDURE: The patient is returned to Special Procedures and positioned supine. The infusion catheter and sheath in the right groin are prepped and draped in a sterile fashion. Steel coils are removed from the infusion catheter. Catheter is pulled back approximately 6 cm into the distal bypass and hand injection of contrast is utilized to demonstrate the distal runoff. Review demonstrates that essentially 97 to 99% of the thrombus has been removed or lysed. There is a lesion noted in the peroneal, there is slow flow in the anterior tibial, and there is a distinct focal stenosis in the posterior tibial which appears to be residual thrombus as well. There also appears to be a greater than 70% stenosis that looks to be a residual thrombus within the midportion of the bypass graft in the previously stented segment. There appears to be some retained minimal thrombus within the bypass and superficial femoral artery as well.   4000 units of heparin is given; later in addition 1000 was given. Catheter is then removed over an 0.014 wire and using an angled catheter and a variety of wires including a Glidewire and an 0.014 grand slam wire first the anterior tibial artery was engaged and injection by hand was used to demonstrate that the tibial artery was widely patent throughout its course and there does not appear to be any distal thrombus perhaps high resistance secondary to  spasm. No further interventions on the anterior tibial were undertaken. Catheter and wire were pulled back into the popliteal and the peroneal artery was engaged and again the  catheter was advanced. Hand injection of contrast demonstrated the peroneal was quite small distally, approximately 2 to 2.5 mm, and there appeared to be an occluded segment in the proximal one-third. Grand slam wire was introduced and a 3 x 20 balloon was advanced down to the level of the ankle and balloon dilatation was performed to 10 atmospheres for one minute. The balloon and wire were then pulled back into the popliteal and using combination of angle wires, glide wires, the posterior tibial artery was engaged. The focal stenosis was addressed with a 3 x 8 balloon, one inflation for one minute to 10 atmospheres was performed. Follow-up angiography from a catheter in the distal popliteal demonstrated complete resolution of the thrombus within the posterior tibial and rapid flow of contrast down to the foot. There was also improved flow of contrast in the anterior tibial. Peroneal remains occluded in the midportion. Having salvaged 2 out of 3 tibial vessels, the area of stenosis and probable residual thrombus within the vein bypass and previously stented segment was dressed with an 8 x 6 balloon. Four serial inflations were performed throughout the course extending up into the superficial femoral. These inflations were to 8 atmospheres. Follow-up angiography now from the sheath demonstrated fairly rapid flow of contrast from the common femoral down to the ankle. There were no focal stenoses noted. There are some areas of mild haziness that do not appear to limit flow and are consistent with some residual thrombus. By estimation I would say 97% or more of the initial thrombus burden has now been lysed and the patient is given a bolus of Integrilin and will be run on Integrilin drip overnight in an attempt to lyse the residual remaining thrombus and maintain patency. He will then be anticoagulated with heparin and Coumadin.   Sheath was pulled into the right external iliac. Oblique view was obtained, StarClose  device was deployed, and there were no immediate complications.   INTERPRETATION: As noted above, a significant amount of the thrombus was lysed. There were areas of concern in all three tibial vessels. Each tibial vessel was interrogated individually; no lesions noted in the anterior tibial; posterior tibial demonstrated a focal area that was treated very effectively with a 3 mm balloon; peroneal did not improve significantly after balloon angioplasty. Residual thrombus and apparent obstruction in the bypass graft itself was treated effectively with 8 mm inflations.   SUMMARY: Successful salvage with recanalization and thrombolysis of the left femoral popliteal bypass graft.   ____________________________ Katha Cabal, MD ggs:drc D: 01/20/2012 09:25:00 ET T: 01/20/2012 10:00:24 ET JOB#: [id number]  cc: Katha Cabal, MD, <Dictator> Adin Hector, MD Katha Cabal MD ELECTRONICALLY SIGNED 02/01/2012 10:33

## 2015-03-31 NOTE — Op Note (Signed)
PATIENT NAME:  [name], [name] MR#:  [id number] DATE OF BIRTH:  09/19/1941  DATE OF PROCEDURE:  01/19/2012  PREOPERATIVE DIAGNOSES:  1. 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  2. 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  3. Popliteal artery aneurysm.  4. Abdominal aortic aneurysm.  5. Central venous stenosis.   POSTOPERATIVE DIAGNOSES:  1. 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  2. 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  3. Popliteal artery aneurysm.  4. Abdominal aortic aneurysm.  5. Central venous stenosis.   PROCEDURES PERFORMED:  1. Abdominal aortogram.  2. Left lower extremity distal runoff, third order catheter placement, with additional third order.  3. Percutaneous transluminal angioplasty of the left SFA.  4. Mechanical thrombectomy using the AngioJet DVX device, left superficial femoral and popliteal arteries.  5. Infusion of TPA as a continuous therapy, left lower extremity.  6. Insertion of right IJ central line with ultrasound guidance.  7. 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   SURGEON: Hortencia Pilar, MD  SEDATION: Versed 4 mg plus fentanyl 200 mcg administered IV. In addition, 2 mg of Dilaudid was administered IV as well. Continuous ECG, pulse oximetry, and cardiopulmonary monitoring was performed throughout the entire procedure by the interventional radiology nurse. Total sedation time was 2 hours.    ACCESSES:  1. 6 French sheath right common femoral artery with ultrasound guidance.  2. Triple lumen catheter, right IJ.   CONTRAST USED: Isovue 72 mL.   FLUORO TIME: 10.5 minutes.   INDICATIONS: [name] is a 74 year old gentleman who presented to the office yesterday with the new complaint of pain in his right  lower extremity which has been present for about one week. It was acute in onset. Approximately one month ago he had been in the office and had received his ultrasound evaluation which demonstrated his bypass graft was patent. Duplex ultrasound yesterday demonstrated thrombosis of his graft. The risks and benefits for treatment were reviewed, all questions are answered, and the patient agrees to proceed.   DESCRIPTION OF PROCEDURE: The patient is brought to special procedures and placed in the supine position. After adequate sedation is achieved, both groins are prepped and draped in sterile fashion. 1% lidocaine is infiltrated in the soft tissues, in the right groin, and ultrasound is placed in a sterile sleeve. Ultrasound is utilized secondary to lack of appropriate landmarks and to avoid vascular injury. Under direct ultrasound visualization, the artery is identified. It is echolucent and pulsatile indicating patency. Image is recorded for the permanent record. Under direct ultrasound visualization, with a single pass, the microneedle is inserted into the anterior wall of the common femoral artery, microwire followed by microsheath, and J-wire followed by 5 French sheath and 5 French pigtail catheter. The pigtail catheter is positioned at the level of T12 and bolus injection of contrast is used to demonstrate the aorta, in the AP projection. Pigtail catheter is then repositioned to above the bifurcation. Bilateral oblique views of the pelvis are obtained. No abnormalities are noted, and a stiff angled Glidewire with the pigtail catheter are then used to cross the aortic bifurcation. The catheter is advanced down into the common femoral where bolus injection of contrast demonstrates very sluggish flow in the proximal SFA. The profunda femoris fills quite well. Straight slip catheter and Glidewire are  then negotiated into the SFA and the catheter and wire combination negotiated down to the stent. Hand injection  within the stent demonstrates thrombus within the stent from the mid SFA throughout the entire stent and extending into the tibioperoneal trunk, anterior tibial, and in fact the posterior tibial and peroneal all demonstrate thrombus within them. When compared with the previous run following Viabahn stent placement in May 2011, the trifurcation was widely patent.   Magic torque wire is then negotiated into the anterior tibial and 8 mg of TPA is reconstituted in 50 mL. The AngioJet DVX device is then opened and prepped on the field. It is then advanced down to the origin of the anterior tibial and using the pulsed spray method 8 mg of TPA is laced throughout the entire thrombosed segment of the popliteal into the proximal anterior tibial. This is then allowed to dwell for 30 minutes. Following this the AngioJet is once again engaged, this time with the aspiration mode, and multiple passes are made. A total of 130 mL of aspirate is obtained. Follow-up angiography demonstrates absolutely no improvement. On directed injection through the catheter there appears to be a stricture stenosis at the proximal leading edge of the stent and this is angioplastied to 7 mm. Distally in the vein graft, in the distal popliteal, this is also angioplastied to 7 mm once. Once again follow-up angiography demonstrated absolutely no flow. There is thrombus noted occluding all three tibial vessels, in their proximal one third, thrombus throughout the stent, popliteal and SFA. Catheter is then negotiated into the anterior tibial where an image is obtained which demonstrates the distal two thirds are patent. This represents third order catheter placement. The catheter is then redirected into the posterior tibial using the Glidewire and again at about the mid point of the posterior tibial, the artery is patent down to the foot filling the pedal arch and free of thrombus. Given the earlier image that demonstrated less thrombus in the anterior  tibial and more extensive thrombus in the posterior tibial, it was elected to place the infusion catheter into the posterior tibial. A 50 cm long infusion catheter is selected. It is advanced over the 0.035 wire. The core is then inserted and is flushed with heparinized saline. The sheath is flushed with heparinized saline.   The right neck is then prepped and draped in sterile fashion. Ultrasound is once again placed in a sterile sleeve. Jugular vein is identified. It is echolucent, homogeneous, and easily compressible. Under direct ultrasound visualization, after recording an image for the permanent record, the microneedle is inserted into the jugular vein without difficulty, single pass is made, and the wire advances quite easily. Microsheath is inserted and then the J-wire is advanced. J-wire will not advance past the level of the clavicle and, therefore, contrast is injected into the microsheath. This represents catheter placement into the central venous system and it demonstrated stricture and narrowing of the jugular vein proximally at the level of the innominate with pooling of contrast. There was no true extravasation. Pulling the sheath back several centimeters allowed for free aspiration of blood. Attempts at passing a wire under fluoroscopic guidance were successful and the microwire was advanced into the atrium without difficulty. Dilator is then passed over the wire and the triple lumen catheter was fed over the microwire without any problem, under fluoroscopic guidance. All three lumens aspirate and flush easily. The catheter is then secured to the skin of the neck with 2-0 silk, and a sterile  dressing is applied with a light pressure dressing. The patient tolerated the procedure without hemodynamic instability and will undergo continuous infusion of TPA, which was initiated at the conclusion. He will undergo 1.5 units/hour through the infusion wire and 400 units/hour of heparin through the sheath.    INTERPRETATION: Initial views of the aorta demonstrate aneurysmal dilatation, however, no hemodynamically significant stenosis is noted. Bilateral renal arteries are noted. No evidence of renal artery stenosis. The common, internal, and external iliac arteries are widely patent bilaterally.   The left common femoral and profunda femoris are widely patent. The superficial femoral artery is patent in its proximal one third but there is very sluggish flow. With catheter-directed angiography within the stented segment, there is thrombus located from the mid SFA throughout the entire stented portion and down into the distal popliteal. Tibioperoneal trunk, peroneal and posterior tibial are all completely occluded in their proximal one third. Anterior tibial demonstrates several centimeters of thrombus, but then appears patent. Later in the case, with a catheter in the anterior tibial, it is demonstrated be patent down to the foot. In a similar fashion, with the catheter directed into the posterior tibial, at the midportion, it becomes patent down to the foot.   Following TPA infusion with a 30 minute dwell and subsequent AngioJet there is absolutely no improvement in fact the tibial vessels appear somewhat worse. Following angioplasty of an apparent leading proximal stenosis, there is still no improvement in flow. Following insertion of the wire and initiation of TPA, the patient does note some improvement with a decrease in his foot pain from a 10/10 to a 2/10. The patient will undergo continuous TPA infusion and will be rechecked for approximately four hours.  ____________________________ Katha Cabal, MD ggs:slb D: 01/19/2012 12:57:54 ET T: 01/19/2012 13:46:54 ET JOB#: [id number]  cc: Katha Cabal, MD, <Dictator> Adin Hector, MD Katha Cabal MD ELECTRONICALLY SIGNED 02/01/2012 10:33

## 2015-03-31 NOTE — Discharge Summary (Signed)
PATIENT NAME:  [name], [name] MR#:  [id number] DATE OF BIRTH:  [birth year]  DATE OF ADMISSION:  01/19/2012 DATE OF DISCHARGE:  01/29/2012  DIAGNOSES:  1. Thrombosis of popliteal artery aneurysm.  2. Atrial fibrillation.  3. Atherosclerotic occlusive disease bilateral lower extremities.  4. Hypertension.  5. Venous insufficiency.  6. Lymphedema.   PROCEDURES PERFORMED:  1. Intraarterial lysis of thrombosed graft.  2. Angioplasty of the tibial vessels.  3. Mechanical thrombolysis.   CONSULTATIONS:  1. Dr. Netty Starring, medical management  2. Dr. Serafina Royals, Cardiology    HISTORY OF PRESENT ILLNESS: [name] is a 74 year old gentleman who presented to the office with an acutely ischemic left foot. He has a history of femoral to below knee popliteal bypass for treatment of a popliteal artery aneurysm and subsequent aneurysmal deterioration of his graft which was treated with stenting two years ago. Follow-up in the office demonstrated that his bypass was widely patent in January, however, he presented acutely the day of admission with a profound ischemia. He is admitted to the hospital for treatment.   HOSPITAL COURSE: On day of admission, he underwent angiography and lytic therapy both with TPA infusion as well as mechanical subsequent follow-up angiography. He underwent multiple interventions as described in the operative note. He did very well from this and had resolution of his thrombus and restoration of flow through his graft. He was subsequently transferred to the Intensive Care Unit where he was maintained on Integrilin drip overnight. Dr. Netty Starring was asked to see him the next day and manage his medical comorbidities. He did well for the first 48 hours but subsequently was found to be in atrial fibrillation on post angio day #3. He was being maintained on a heparin drip. He also was noted to have significant swelling of the left lower extremity which was quite concerning to the  family. He continued to improve, was subsequently continued on his anticoagulation converted to Coumadin, and on hospital day #10, postoperative day #10 he was found to be therapeutic. His leg was under good control as far as its edema and pain and he was felt fit for discharge.   DISPOSITION: He is discharged to home.   FOLLOW-UP: 1. He will follow-up in the office in 1 to 2 weeks.  2. He will follow-up with Dr. Nehemiah Massed as arranged.  3. He will follow-up with Dr. Netty Starring as arranged.   DIET: Regular.   NEW MEDICATIONS: Coumadin.  ACTIVITY: As tolerated with frequent periods of elevation.   ____________________________ Katha Cabal, MD ggs:drc D: 02/19/2012 13:16:03 ET T: 02/19/2012 13:26:19 ET JOB#: [id number]  cc: Katha Cabal, MD, <Dictator> Katha Cabal MD ELECTRONICALLY SIGNED 02/23/2012 12:21

## 2015-05-10 DIAGNOSIS — I10 Essential (primary) hypertension: Secondary | ICD-10-CM | POA: Insufficient documentation

## 2015-09-09 ENCOUNTER — Emergency Department
Admission: EM | Admit: 2015-09-09 | Discharge: 2015-09-09 | Disposition: A | Discharge status: Home / Self Care | Payer: PPO | Attending: Student | Admitting: Student

## 2015-09-09 ENCOUNTER — Emergency Department: Payer: PPO

## 2015-09-09 ENCOUNTER — Encounter: Payer: Self-pay | Admitting: Emergency Medicine

## 2015-09-09 DIAGNOSIS — W19XXXA Unspecified fall, initial encounter: Secondary | ICD-10-CM | POA: Diagnosis not present

## 2015-09-09 DIAGNOSIS — Y9301 Activity, walking, marching and hiking: Secondary | ICD-10-CM | POA: Diagnosis not present

## 2015-09-09 DIAGNOSIS — Y9289 Other specified places as the place of occurrence of the external cause: Secondary | ICD-10-CM | POA: Insufficient documentation

## 2015-09-09 DIAGNOSIS — S0990XA Unspecified injury of head, initial encounter: Secondary | ICD-10-CM | POA: Diagnosis not present

## 2015-09-09 DIAGNOSIS — S022XXA Fracture of nasal bones, initial encounter for closed fracture: Secondary | ICD-10-CM | POA: Diagnosis not present

## 2015-09-09 DIAGNOSIS — Y998 Other external cause status: Secondary | ICD-10-CM | POA: Insufficient documentation

## 2015-09-09 DIAGNOSIS — S0992XA Unspecified injury of nose, initial encounter: Secondary | ICD-10-CM | POA: Diagnosis present

## 2015-09-09 LAB — CBC
HCT: 39.5 % — ABNORMAL LOW (ref 40.0–52.0)
HEMOGLOBIN: 13.1 g/dL (ref 13.0–18.0)
MCH: 28.7 pg (ref 26.0–34.0)
MCHC: 33.2 g/dL (ref 32.0–36.0)
MCV: 86.3 fL (ref 80.0–100.0)
Platelets: 181 10*3/uL (ref 150–440)
RBC: 4.58 MIL/uL (ref 4.40–5.90)
RDW: 16.3 % — AB (ref 11.5–14.5)
WBC: 9.5 10*3/uL (ref 3.8–10.6)

## 2015-09-09 LAB — APTT: aPTT: 36 seconds (ref 24–36)

## 2015-09-09 LAB — PROTIME-INR
INR: 1.45
PROTHROMBIN TIME: 17.8 s — AB (ref 11.4–15.0)

## 2015-09-09 MED ORDER — BACITRACIN ZINC 500 UNIT/GM EX OINT
TOPICAL_OINTMENT | CUTANEOUS | Status: AC
  Administered 2015-09-09: 1
  Filled 2015-09-09: qty 1.8

## 2015-09-09 MED ORDER — OXYMETAZOLINE HCL 0.05 % NA SOLN
NASAL | Status: AC
  Administered 2015-09-09: 1 via NASAL
  Filled 2015-09-09: qty 15

## 2015-09-09 MED ORDER — OXYMETAZOLINE HCL 0.05 % NA SOLN
1.0000 | Freq: Once | NASAL | Status: AC
  Administered 2015-09-09: 1 via NASAL

## 2015-09-09 MED ORDER — TRAMADOL HCL 50 MG PO TABS
50.0000 mg | ORAL_TABLET | Freq: Once | ORAL | Status: AC
  Administered 2015-09-09: 50 mg via ORAL
  Filled 2015-09-09: qty 1

## 2015-09-09 MED ORDER — TRAMADOL HCL 50 MG PO TABS: 50.0000 mg | ORAL_TABLET | Freq: Four times a day (QID) | ORAL | Status: AC | PRN

## 2015-09-09 MED ORDER — AMOXICILLIN-POT CLAVULANATE 875-125 MG PO TABS
1.0000 | ORAL_TABLET | Freq: Once | ORAL | Status: AC
  Administered 2015-09-09: 1 via ORAL
  Filled 2015-09-09: qty 1

## 2015-09-09 MED ORDER — AMOXICILLIN-POT CLAVULANATE 875-125 MG PO TABS: 1.0000 | ORAL_TABLET | Freq: Two times a day (BID) | ORAL | Status: AC

## 2015-09-09 NOTE — ED Notes (Signed)
Pt tripped and fell , abrasion noted to bridge of nose and chin.  Abrasions to bilat knees.  Pt denies LOC, on eloquist

## 2015-09-09 NOTE — ED Notes (Signed)
Wounds to nose cleaned with NS and sterile gauze. Bacitracin applied. Patient tolerated well.

## 2015-09-09 NOTE — ED Provider Notes (Signed)
[facility] Emergency Department Provider Note  ____________________________________________  Time seen: Approximately 8:41 PM  I have reviewed the triage vital signs and the nursing notes.   HISTORY  Chief Complaint Fall    HPI [name]. is a 74 y.o. male history of COPD, paroxysmal atrial fibrillation on Eliquis who presents for evaluation of traumatic face, head, neck pain after a mechanical fall which occurred suddenly just prior to arrival. Patient reports that he was walking down an incline when he lost his footing, falling forward onto his palms and face and scraping his knees. No loss of consciousness. No vomiting. No vision change, numbness or weakness. Current severity of pain in his face is moderate. No modifying factors. Prior to today, he had been in his usual state of health. Pain has been constant since onset.  History reviewed. No pertinent past medical history.  There are no active problems to display for this patient.   History reviewed. No pertinent past surgical history.  Current Outpatient Rx  Name  Route  Sig  Dispense  Refill  . amoxicillin-clavulanate (AUGMENTIN) 875-125 MG tablet   Oral   Take 1 tablet by mouth 2 (two) times daily.   14 tablet   0     Allergies Review of patient's allergies indicates no known allergies.  History reviewed. No pertinent family history.  Social History Social History  Substance Use Topics  . Smoking status: Never Smoker   . Smokeless tobacco: None  . Alcohol Use: None    Review of Systems Constitutional: No fever/chills Eyes: No visual changes. ENT: No sore throat. Cardiovascular: Denies chest pain. Respiratory: Denies shortness of breath. Gastrointestinal: No abdominal pain.  No nausea, no vomiting.  No diarrhea.  No constipation. Genitourinary: Negative for dysuria. Musculoskeletal: Negative for back pain. Skin: Negative for rash. Neurological: Negative for  headaches, focal weakness or numbness.  10-point ROS otherwise negative.  ____________________________________________   PHYSICAL EXAM:  VITAL SIGNS: ED Triage Vitals  Enc Vitals Group     BP 09/09/15 1659 173/69 mmHg     Pulse Rate 09/09/15 1659 60     Resp 09/09/15 1659 18     Temp 09/09/15 1659 97.9 F (36.6 C)     Temp Source 09/09/15 1659 Oral     SpO2 09/09/15 1659 96 %     Weight 09/09/15 1659 250 lb (113.399 kg)     Height 09/09/15 1659 _0  (1.803 m)     Head Cir --      Peak Flow --      Pain Score --      Pain Loc --      Pain Edu? --      Excl. in [place]? --     Constitutional: Alert and oriented. Well appearing and in no acute distress. Eyes: Conjunctivae are normal. PERRL. EOMI. Head: Tenderness to palpation throughout the nose with ecchymosis, superficial hemostatic abrasions to the nose, ecchymosis surrounding the right eye. Nose: Small amount of blood in the anterior naris bilaterally. No nasal septal hematoma. Mouth/Throat: Mucous membranes are moist.  Oropharynx non-erythematous. Neck: No stridor. C-collar applied on arrival to room and ER. Cardiovascular: Normal rate, regular rhythm. Grossly normal heart sounds.  Good peripheral circulation. Respiratory: Normal respiratory effort.  No retractions. Lungs CTAB. Gastrointestinal: Soft and nontender. No distention. No abdominal bruits. No CVA tenderness. Genitourinary: Deferred Musculoskeletal: No lower extremity tenderness nor edema.  No joint effusions. Very small superficial abrasions to bilateral knees but full range  of motion at the knees without bony tenderness or deformity. Neurologic:  Normal speech and language. No gross focal neurologic deficits are appreciated. 5 out of 5 strength in bilateral upper and lower extremities, sensation intact to light touch throughout. Skin:  Skin is warm, dry and intact. No rash noted. Psychiatric: Mood and affect are normal. Speech and behavior are  normal.  ____________________________________________   LABS (all labs ordered are listed, but only abnormal results are displayed)  Labs Reviewed  CBC - Abnormal; Notable for the following:    HCT 39.5 (*)    RDW 16.3 (*)    All other components within normal limits  PROTIME-INR - Abnormal; Notable for the following:    Prothrombin Time 17.8 (*)    All other components within normal limits  APTT   ____________________________________________  EKG  None ____________________________________________  RADIOLOGY  CT head, CT C-spine, CT maxillofacial  IMPRESSION: 1. Bilateral nasal bone fractures. Probable fracture of the bony nasal septum. 2. Anterior facial soft tissue swelling with preseptal swelling in both orbits. 3. No definite acute cervical spine findings. There is mild depression of the superior endplate of C7 which is age indeterminate but without paraspinal edema or other definite acute features. 4. Multilevel cervical spondylosis. ____________________________________________   PROCEDURES  Procedure(s) performed: None  Critical Care performed: No  ____________________________________________   INITIAL IMPRESSION / ASSESSMENT AND PLAN / ED COURSE  Pertinent labs & imaging results that were available during my care of the patient were reviewed by me and considered in my medical decision making (see chart for details).  [name]. is a 74 y.o. male history of COPD, paroxysmal atrial fibrillation on Eliquis who presents for evaluation of traumatic face, head, neck pain after a mechanical fall which occurred suddenly just prior to arrival. On exam, he is nontoxic appearing and in no acute distress. He does have obvious trauma to the nose and the right periorbital region however actually movements are intact, pupils are equally reactive to light. He was complaining of some neck pains on arrival to ER room 13, c-collar was placed. CT head ordered in  triage shows bilateral nasal bone fractures with blood in the ethmoid sinuses, no acute intracranial process. He will likely require outpatient ENT follow-up as well as Augmentin for these. I'm awaiting formal CT maxillofacial and CT C-spine. We'll check CBC and coags because he is anticoagulated. We'll treat his pain.  ----------------------------------------- 10:21 PM on 09/09/2015 -----------------------------------------  CT c-spine with no definite acute fracture. There is a note of age indeterminate mild depression of the superior endplate of C7 without paraspinal edema or other definite acute features. The patient has no midline tenderness to palpation at this time. Specifically he has no tenderness associated with C7. I doubt any acute fracture. C-collar cleared CBC is unremarkable. INR 1.45. He received his last tetanus vaccine within the past 5 years. Discussed return precautions, need for close ECP and ENT follow-up and the patient and his wife at bedside are comfortable with the discharge plan.  ____________________________________________   FINAL CLINICAL IMPRESSION(S) / ED DIAGNOSES  Final diagnoses:  Nasal bone fracture, closed, initial encounter  Fall, initial encounter  Head injury, initial encounter      Joanne Gavel, MD 09/09/15 2223

## 2015-09-30 DIAGNOSIS — I4819 Other persistent atrial fibrillation: Secondary | ICD-10-CM | POA: Insufficient documentation

## 2015-09-30 DIAGNOSIS — I4821 Permanent atrial fibrillation: Secondary | ICD-10-CM | POA: Insufficient documentation

## 2015-11-05 DIAGNOSIS — G250 Essential tremor: Secondary | ICD-10-CM | POA: Insufficient documentation

## 2015-11-18 ENCOUNTER — Other Ambulatory Visit: Payer: Self-pay | Admitting: Physical Medicine and Rehabilitation

## 2015-11-18 DIAGNOSIS — M5416 Radiculopathy, lumbar region: Secondary | ICD-10-CM

## 2015-12-05 ENCOUNTER — Ambulatory Visit
Admission: RE | Admit: 2015-12-05 | Discharge: 2015-12-05 | Disposition: A | Discharge status: Home / Self Care | Payer: PPO | Source: Ambulatory Visit | Attending: Physical Medicine and Rehabilitation | Admitting: Physical Medicine and Rehabilitation

## 2015-12-05 DIAGNOSIS — M5416 Radiculopathy, lumbar region: Secondary | ICD-10-CM

## 2015-12-05 DIAGNOSIS — M545 Low back pain: Secondary | ICD-10-CM | POA: Diagnosis not present

## 2015-12-05 DIAGNOSIS — M4806 Spinal stenosis, lumbar region: Secondary | ICD-10-CM | POA: Diagnosis not present

## 2015-12-05 DIAGNOSIS — R200 Anesthesia of skin: Secondary | ICD-10-CM | POA: Diagnosis not present

## 2015-12-16 DIAGNOSIS — J449 Chronic obstructive pulmonary disease, unspecified: Secondary | ICD-10-CM | POA: Diagnosis not present

## 2015-12-16 DIAGNOSIS — G4730 Sleep apnea, unspecified: Secondary | ICD-10-CM | POA: Diagnosis not present

## 2016-01-09 DIAGNOSIS — G250 Essential tremor: Secondary | ICD-10-CM | POA: Diagnosis not present

## 2016-01-10 DIAGNOSIS — M5416 Radiculopathy, lumbar region: Secondary | ICD-10-CM | POA: Diagnosis not present

## 2016-01-10 DIAGNOSIS — M5136 Other intervertebral disc degeneration, lumbar region: Secondary | ICD-10-CM | POA: Diagnosis not present

## 2016-01-10 DIAGNOSIS — M4806 Spinal stenosis, lumbar region: Secondary | ICD-10-CM | POA: Diagnosis not present

## 2016-01-16 DIAGNOSIS — J449 Chronic obstructive pulmonary disease, unspecified: Secondary | ICD-10-CM | POA: Diagnosis not present

## 2016-01-16 DIAGNOSIS — G4730 Sleep apnea, unspecified: Secondary | ICD-10-CM | POA: Diagnosis not present

## 2016-01-22 DIAGNOSIS — L570 Actinic keratosis: Secondary | ICD-10-CM | POA: Diagnosis not present

## 2016-01-22 DIAGNOSIS — Z85828 Personal history of other malignant neoplasm of skin: Secondary | ICD-10-CM | POA: Diagnosis not present

## 2016-01-22 DIAGNOSIS — X32XXXA Exposure to sunlight, initial encounter: Secondary | ICD-10-CM | POA: Diagnosis not present

## 2016-01-22 DIAGNOSIS — L821 Other seborrheic keratosis: Secondary | ICD-10-CM | POA: Diagnosis not present

## 2016-01-22 DIAGNOSIS — Z08 Encounter for follow-up examination after completed treatment for malignant neoplasm: Secondary | ICD-10-CM | POA: Diagnosis not present

## 2016-02-04 DIAGNOSIS — I25810 Atherosclerosis of coronary artery bypass graft(s) without angina pectoris: Secondary | ICD-10-CM | POA: Diagnosis not present

## 2016-02-04 DIAGNOSIS — J069 Acute upper respiratory infection, unspecified: Secondary | ICD-10-CM | POA: Diagnosis not present

## 2016-02-04 DIAGNOSIS — I429 Cardiomyopathy, unspecified: Secondary | ICD-10-CM | POA: Diagnosis not present

## 2016-02-04 DIAGNOSIS — I480 Paroxysmal atrial fibrillation: Secondary | ICD-10-CM | POA: Diagnosis not present

## 2016-02-07 DIAGNOSIS — M5416 Radiculopathy, lumbar region: Secondary | ICD-10-CM | POA: Diagnosis not present

## 2016-02-07 DIAGNOSIS — M4806 Spinal stenosis, lumbar region: Secondary | ICD-10-CM | POA: Diagnosis not present

## 2016-02-07 DIAGNOSIS — M5136 Other intervertebral disc degeneration, lumbar region: Secondary | ICD-10-CM | POA: Diagnosis not present

## 2016-02-12 DIAGNOSIS — H1132 Conjunctival hemorrhage, left eye: Secondary | ICD-10-CM | POA: Diagnosis not present

## 2016-02-13 DIAGNOSIS — J449 Chronic obstructive pulmonary disease, unspecified: Secondary | ICD-10-CM | POA: Diagnosis not present

## 2016-02-13 DIAGNOSIS — G4730 Sleep apnea, unspecified: Secondary | ICD-10-CM | POA: Diagnosis not present

## 2016-02-14 DIAGNOSIS — I739 Peripheral vascular disease, unspecified: Secondary | ICD-10-CM | POA: Diagnosis not present

## 2016-02-14 DIAGNOSIS — I25810 Atherosclerosis of coronary artery bypass graft(s) without angina pectoris: Secondary | ICD-10-CM | POA: Diagnosis not present

## 2016-02-14 DIAGNOSIS — I480 Paroxysmal atrial fibrillation: Secondary | ICD-10-CM | POA: Diagnosis not present

## 2016-02-14 DIAGNOSIS — R001 Bradycardia, unspecified: Secondary | ICD-10-CM | POA: Diagnosis not present

## 2016-03-06 DIAGNOSIS — M1A9XX0 Chronic gout, unspecified, without tophus (tophi): Secondary | ICD-10-CM | POA: Diagnosis not present

## 2016-03-06 DIAGNOSIS — I25810 Atherosclerosis of coronary artery bypass graft(s) without angina pectoris: Secondary | ICD-10-CM | POA: Diagnosis not present

## 2016-03-06 DIAGNOSIS — R739 Hyperglycemia, unspecified: Secondary | ICD-10-CM | POA: Diagnosis not present

## 2016-03-06 DIAGNOSIS — E782 Mixed hyperlipidemia: Secondary | ICD-10-CM | POA: Diagnosis not present

## 2016-03-06 DIAGNOSIS — I481 Persistent atrial fibrillation: Secondary | ICD-10-CM | POA: Diagnosis not present

## 2016-03-13 DIAGNOSIS — I739 Peripheral vascular disease, unspecified: Secondary | ICD-10-CM | POA: Diagnosis not present

## 2016-03-13 DIAGNOSIS — I25810 Atherosclerosis of coronary artery bypass graft(s) without angina pectoris: Secondary | ICD-10-CM | POA: Diagnosis not present

## 2016-03-13 DIAGNOSIS — M1A9XX0 Chronic gout, unspecified, without tophus (tophi): Secondary | ICD-10-CM | POA: Diagnosis not present

## 2016-03-13 DIAGNOSIS — R739 Hyperglycemia, unspecified: Secondary | ICD-10-CM | POA: Diagnosis not present

## 2016-03-13 DIAGNOSIS — I10 Essential (primary) hypertension: Secondary | ICD-10-CM | POA: Diagnosis not present

## 2016-03-13 DIAGNOSIS — E782 Mixed hyperlipidemia: Secondary | ICD-10-CM | POA: Diagnosis not present

## 2016-03-13 DIAGNOSIS — M48061 Spinal stenosis, lumbar region without neurogenic claudication: Secondary | ICD-10-CM | POA: Insufficient documentation

## 2016-03-13 DIAGNOSIS — I480 Paroxysmal atrial fibrillation: Secondary | ICD-10-CM | POA: Diagnosis not present

## 2016-03-13 DIAGNOSIS — G250 Essential tremor: Secondary | ICD-10-CM | POA: Diagnosis not present

## 2016-03-13 DIAGNOSIS — G4733 Obstructive sleep apnea (adult) (pediatric): Secondary | ICD-10-CM | POA: Diagnosis not present

## 2016-03-13 DIAGNOSIS — J8410 Pulmonary fibrosis, unspecified: Secondary | ICD-10-CM | POA: Insufficient documentation

## 2016-03-13 DIAGNOSIS — I429 Cardiomyopathy, unspecified: Secondary | ICD-10-CM | POA: Diagnosis not present

## 2016-03-13 DIAGNOSIS — J449 Chronic obstructive pulmonary disease, unspecified: Secondary | ICD-10-CM | POA: Diagnosis not present

## 2016-03-15 DIAGNOSIS — G4730 Sleep apnea, unspecified: Secondary | ICD-10-CM | POA: Diagnosis not present

## 2016-03-15 DIAGNOSIS — J449 Chronic obstructive pulmonary disease, unspecified: Secondary | ICD-10-CM | POA: Diagnosis not present

## 2016-03-16 DIAGNOSIS — H35352 Cystoid macular degeneration, left eye: Secondary | ICD-10-CM | POA: Diagnosis not present

## 2016-03-23 DIAGNOSIS — M4806 Spinal stenosis, lumbar region: Secondary | ICD-10-CM | POA: Diagnosis not present

## 2016-03-23 DIAGNOSIS — M5416 Radiculopathy, lumbar region: Secondary | ICD-10-CM | POA: Diagnosis not present

## 2016-03-23 DIAGNOSIS — M5136 Other intervertebral disc degeneration, lumbar region: Secondary | ICD-10-CM | POA: Diagnosis not present

## 2016-04-02 DIAGNOSIS — G250 Essential tremor: Secondary | ICD-10-CM | POA: Diagnosis not present

## 2016-04-13 ENCOUNTER — Other Ambulatory Visit: Payer: Self-pay | Admitting: Specialist

## 2016-04-13 DIAGNOSIS — J8410 Pulmonary fibrosis, unspecified: Secondary | ICD-10-CM

## 2016-04-13 DIAGNOSIS — J449 Chronic obstructive pulmonary disease, unspecified: Secondary | ICD-10-CM | POA: Diagnosis not present

## 2016-04-13 DIAGNOSIS — G4733 Obstructive sleep apnea (adult) (pediatric): Secondary | ICD-10-CM | POA: Diagnosis not present

## 2016-04-14 DIAGNOSIS — G4730 Sleep apnea, unspecified: Secondary | ICD-10-CM | POA: Diagnosis not present

## 2016-04-14 DIAGNOSIS — J449 Chronic obstructive pulmonary disease, unspecified: Secondary | ICD-10-CM | POA: Diagnosis not present

## 2016-04-21 DIAGNOSIS — M4316 Spondylolisthesis, lumbar region: Secondary | ICD-10-CM | POA: Diagnosis not present

## 2016-04-21 DIAGNOSIS — M545 Low back pain: Secondary | ICD-10-CM | POA: Diagnosis not present

## 2016-04-21 DIAGNOSIS — M5416 Radiculopathy, lumbar region: Secondary | ICD-10-CM | POA: Diagnosis not present

## 2016-04-22 ENCOUNTER — Ambulatory Visit
Admission: RE | Admit: 2016-04-22 | Discharge: 2016-04-22 | Disposition: A | Discharge status: Home / Self Care | Payer: PPO | Source: Ambulatory Visit | Attending: Specialist | Admitting: Specialist

## 2016-04-22 DIAGNOSIS — Z951 Presence of aortocoronary bypass graft: Secondary | ICD-10-CM | POA: Diagnosis not present

## 2016-04-22 DIAGNOSIS — J8410 Pulmonary fibrosis, unspecified: Secondary | ICD-10-CM | POA: Insufficient documentation

## 2016-04-22 DIAGNOSIS — R911 Solitary pulmonary nodule: Secondary | ICD-10-CM | POA: Insufficient documentation

## 2016-04-22 DIAGNOSIS — J432 Centrilobular emphysema: Secondary | ICD-10-CM | POA: Diagnosis not present

## 2016-04-22 DIAGNOSIS — I2510 Atherosclerotic heart disease of native coronary artery without angina pectoris: Secondary | ICD-10-CM | POA: Diagnosis not present

## 2016-04-22 DIAGNOSIS — K449 Diaphragmatic hernia without obstruction or gangrene: Secondary | ICD-10-CM | POA: Diagnosis not present

## 2016-04-24 DIAGNOSIS — G4733 Obstructive sleep apnea (adult) (pediatric): Secondary | ICD-10-CM | POA: Diagnosis not present

## 2016-05-15 DIAGNOSIS — G4730 Sleep apnea, unspecified: Secondary | ICD-10-CM | POA: Diagnosis not present

## 2016-05-15 DIAGNOSIS — J449 Chronic obstructive pulmonary disease, unspecified: Secondary | ICD-10-CM | POA: Diagnosis not present

## 2016-05-25 DIAGNOSIS — J84112 Idiopathic pulmonary fibrosis: Secondary | ICD-10-CM | POA: Diagnosis not present

## 2016-05-25 DIAGNOSIS — G4734 Idiopathic sleep related nonobstructive alveolar hypoventilation: Secondary | ICD-10-CM | POA: Diagnosis not present

## 2016-05-25 DIAGNOSIS — R0602 Shortness of breath: Secondary | ICD-10-CM | POA: Diagnosis not present

## 2016-05-25 DIAGNOSIS — J449 Chronic obstructive pulmonary disease, unspecified: Secondary | ICD-10-CM | POA: Diagnosis not present

## 2016-05-25 DIAGNOSIS — R911 Solitary pulmonary nodule: Secondary | ICD-10-CM | POA: Diagnosis not present

## 2016-05-27 ENCOUNTER — Other Ambulatory Visit: Payer: Self-pay | Admitting: Specialist

## 2016-05-27 DIAGNOSIS — R911 Solitary pulmonary nodule: Secondary | ICD-10-CM

## 2016-05-27 DIAGNOSIS — R0602 Shortness of breath: Secondary | ICD-10-CM

## 2016-05-27 DIAGNOSIS — J84112 Idiopathic pulmonary fibrosis: Secondary | ICD-10-CM

## 2016-06-08 ENCOUNTER — Ambulatory Visit: Payer: PPO

## 2016-06-14 DIAGNOSIS — J449 Chronic obstructive pulmonary disease, unspecified: Secondary | ICD-10-CM | POA: Diagnosis not present

## 2016-06-14 DIAGNOSIS — G4730 Sleep apnea, unspecified: Secondary | ICD-10-CM | POA: Diagnosis not present

## 2016-06-15 DIAGNOSIS — I480 Paroxysmal atrial fibrillation: Secondary | ICD-10-CM | POA: Diagnosis not present

## 2016-06-15 DIAGNOSIS — E782 Mixed hyperlipidemia: Secondary | ICD-10-CM | POA: Diagnosis not present

## 2016-06-15 DIAGNOSIS — I25810 Atherosclerosis of coronary artery bypass graft(s) without angina pectoris: Secondary | ICD-10-CM | POA: Diagnosis not present

## 2016-06-15 DIAGNOSIS — I340 Nonrheumatic mitral (valve) insufficiency: Secondary | ICD-10-CM | POA: Diagnosis not present

## 2016-06-15 DIAGNOSIS — I429 Cardiomyopathy, unspecified: Secondary | ICD-10-CM | POA: Diagnosis not present

## 2016-06-15 DIAGNOSIS — G4733 Obstructive sleep apnea (adult) (pediatric): Secondary | ICD-10-CM | POA: Diagnosis not present

## 2016-06-15 DIAGNOSIS — I10 Essential (primary) hypertension: Secondary | ICD-10-CM | POA: Diagnosis not present

## 2016-06-15 DIAGNOSIS — I739 Peripheral vascular disease, unspecified: Secondary | ICD-10-CM | POA: Diagnosis not present

## 2016-06-15 DIAGNOSIS — R001 Bradycardia, unspecified: Secondary | ICD-10-CM | POA: Diagnosis not present

## 2016-06-23 DIAGNOSIS — M1711 Unilateral primary osteoarthritis, right knee: Secondary | ICD-10-CM | POA: Diagnosis not present

## 2016-06-23 DIAGNOSIS — M1712 Unilateral primary osteoarthritis, left knee: Secondary | ICD-10-CM | POA: Diagnosis not present

## 2016-06-23 DIAGNOSIS — G8929 Other chronic pain: Secondary | ICD-10-CM | POA: Diagnosis not present

## 2016-06-23 DIAGNOSIS — M25562 Pain in left knee: Secondary | ICD-10-CM | POA: Diagnosis not present

## 2016-07-15 DIAGNOSIS — J449 Chronic obstructive pulmonary disease, unspecified: Secondary | ICD-10-CM | POA: Diagnosis not present

## 2016-07-15 DIAGNOSIS — G4730 Sleep apnea, unspecified: Secondary | ICD-10-CM | POA: Diagnosis not present

## 2016-08-05 DIAGNOSIS — I739 Peripheral vascular disease, unspecified: Secondary | ICD-10-CM | POA: Diagnosis not present

## 2016-08-05 DIAGNOSIS — I10 Essential (primary) hypertension: Secondary | ICD-10-CM | POA: Diagnosis not present

## 2016-08-05 DIAGNOSIS — I714 Abdominal aortic aneurysm, without rupture: Secondary | ICD-10-CM | POA: Diagnosis not present

## 2016-08-05 DIAGNOSIS — M79609 Pain in unspecified limb: Secondary | ICD-10-CM | POA: Diagnosis not present

## 2016-08-05 DIAGNOSIS — I70213 Atherosclerosis of native arteries of extremities with intermittent claudication, bilateral legs: Secondary | ICD-10-CM | POA: Diagnosis not present

## 2016-08-05 DIAGNOSIS — I724 Aneurysm of artery of lower extremity: Secondary | ICD-10-CM | POA: Diagnosis not present

## 2016-08-05 DIAGNOSIS — E669 Obesity, unspecified: Secondary | ICD-10-CM | POA: Diagnosis not present

## 2016-08-05 DIAGNOSIS — I6523 Occlusion and stenosis of bilateral carotid arteries: Secondary | ICD-10-CM | POA: Diagnosis not present

## 2016-08-05 DIAGNOSIS — E785 Hyperlipidemia, unspecified: Secondary | ICD-10-CM | POA: Diagnosis not present

## 2016-08-05 DIAGNOSIS — I6529 Occlusion and stenosis of unspecified carotid artery: Secondary | ICD-10-CM | POA: Diagnosis not present

## 2016-08-05 DIAGNOSIS — I70262 Atherosclerosis of native arteries of extremities with gangrene, left leg: Secondary | ICD-10-CM | POA: Diagnosis not present

## 2016-08-14 DIAGNOSIS — M4316 Spondylolisthesis, lumbar region: Secondary | ICD-10-CM | POA: Diagnosis not present

## 2016-08-14 DIAGNOSIS — M545 Low back pain: Secondary | ICD-10-CM | POA: Diagnosis not present

## 2016-08-14 DIAGNOSIS — M4806 Spinal stenosis, lumbar region: Secondary | ICD-10-CM | POA: Diagnosis not present

## 2016-08-15 DIAGNOSIS — G4730 Sleep apnea, unspecified: Secondary | ICD-10-CM | POA: Diagnosis not present

## 2016-08-15 DIAGNOSIS — J449 Chronic obstructive pulmonary disease, unspecified: Secondary | ICD-10-CM | POA: Diagnosis not present

## 2016-09-11 DIAGNOSIS — M1A9XX0 Chronic gout, unspecified, without tophus (tophi): Secondary | ICD-10-CM | POA: Diagnosis not present

## 2016-09-11 DIAGNOSIS — R739 Hyperglycemia, unspecified: Secondary | ICD-10-CM | POA: Diagnosis not present

## 2016-09-11 DIAGNOSIS — Z125 Encounter for screening for malignant neoplasm of prostate: Secondary | ICD-10-CM | POA: Diagnosis not present

## 2016-09-11 DIAGNOSIS — I480 Paroxysmal atrial fibrillation: Secondary | ICD-10-CM | POA: Diagnosis not present

## 2016-09-11 DIAGNOSIS — I10 Essential (primary) hypertension: Secondary | ICD-10-CM | POA: Diagnosis not present

## 2016-09-11 DIAGNOSIS — I739 Peripheral vascular disease, unspecified: Secondary | ICD-10-CM | POA: Diagnosis not present

## 2016-09-11 DIAGNOSIS — I25810 Atherosclerosis of coronary artery bypass graft(s) without angina pectoris: Secondary | ICD-10-CM | POA: Diagnosis not present

## 2016-09-14 DIAGNOSIS — J449 Chronic obstructive pulmonary disease, unspecified: Secondary | ICD-10-CM | POA: Diagnosis not present

## 2016-09-14 DIAGNOSIS — G4730 Sleep apnea, unspecified: Secondary | ICD-10-CM | POA: Diagnosis not present

## 2016-09-18 DIAGNOSIS — E782 Mixed hyperlipidemia: Secondary | ICD-10-CM | POA: Diagnosis not present

## 2016-09-18 DIAGNOSIS — G250 Essential tremor: Secondary | ICD-10-CM | POA: Diagnosis not present

## 2016-09-18 DIAGNOSIS — M1A9XX0 Chronic gout, unspecified, without tophus (tophi): Secondary | ICD-10-CM | POA: Diagnosis not present

## 2016-09-18 DIAGNOSIS — M1712 Unilateral primary osteoarthritis, left knee: Secondary | ICD-10-CM | POA: Diagnosis not present

## 2016-09-18 DIAGNOSIS — I25810 Atherosclerosis of coronary artery bypass graft(s) without angina pectoris: Secondary | ICD-10-CM | POA: Diagnosis not present

## 2016-09-18 DIAGNOSIS — M47816 Spondylosis without myelopathy or radiculopathy, lumbar region: Secondary | ICD-10-CM | POA: Insufficient documentation

## 2016-09-18 DIAGNOSIS — I429 Cardiomyopathy, unspecified: Secondary | ICD-10-CM | POA: Diagnosis not present

## 2016-09-18 DIAGNOSIS — I480 Paroxysmal atrial fibrillation: Secondary | ICD-10-CM | POA: Diagnosis not present

## 2016-09-18 DIAGNOSIS — J449 Chronic obstructive pulmonary disease, unspecified: Secondary | ICD-10-CM | POA: Diagnosis not present

## 2016-09-18 DIAGNOSIS — I739 Peripheral vascular disease, unspecified: Secondary | ICD-10-CM | POA: Diagnosis not present

## 2016-09-18 DIAGNOSIS — G4733 Obstructive sleep apnea (adult) (pediatric): Secondary | ICD-10-CM | POA: Diagnosis not present

## 2016-09-18 DIAGNOSIS — R739 Hyperglycemia, unspecified: Secondary | ICD-10-CM | POA: Diagnosis not present

## 2016-09-18 DIAGNOSIS — I10 Essential (primary) hypertension: Secondary | ICD-10-CM | POA: Diagnosis not present

## 2016-10-15 DIAGNOSIS — J449 Chronic obstructive pulmonary disease, unspecified: Secondary | ICD-10-CM | POA: Diagnosis not present

## 2016-10-15 DIAGNOSIS — G4730 Sleep apnea, unspecified: Secondary | ICD-10-CM | POA: Diagnosis not present

## 2016-10-20 DIAGNOSIS — I340 Nonrheumatic mitral (valve) insufficiency: Secondary | ICD-10-CM | POA: Diagnosis not present

## 2016-10-20 DIAGNOSIS — E782 Mixed hyperlipidemia: Secondary | ICD-10-CM | POA: Diagnosis not present

## 2016-10-20 DIAGNOSIS — R001 Bradycardia, unspecified: Secondary | ICD-10-CM | POA: Diagnosis not present

## 2016-10-20 DIAGNOSIS — I10 Essential (primary) hypertension: Secondary | ICD-10-CM | POA: Diagnosis not present

## 2016-10-20 DIAGNOSIS — I480 Paroxysmal atrial fibrillation: Secondary | ICD-10-CM | POA: Diagnosis not present

## 2016-10-20 DIAGNOSIS — I25810 Atherosclerosis of coronary artery bypass graft(s) without angina pectoris: Secondary | ICD-10-CM | POA: Diagnosis not present

## 2016-10-20 DIAGNOSIS — G4733 Obstructive sleep apnea (adult) (pediatric): Secondary | ICD-10-CM | POA: Diagnosis not present

## 2016-10-20 DIAGNOSIS — I429 Cardiomyopathy, unspecified: Secondary | ICD-10-CM | POA: Diagnosis not present

## 2016-11-12 ENCOUNTER — Ambulatory Visit: Payer: PPO

## 2016-11-14 DIAGNOSIS — J449 Chronic obstructive pulmonary disease, unspecified: Secondary | ICD-10-CM | POA: Diagnosis not present

## 2016-11-14 DIAGNOSIS — G4730 Sleep apnea, unspecified: Secondary | ICD-10-CM | POA: Diagnosis not present

## 2016-11-18 DIAGNOSIS — Z86010 Personal history of colonic polyps: Secondary | ICD-10-CM | POA: Diagnosis not present

## 2016-11-19 ENCOUNTER — Ambulatory Visit: Payer: PPO

## 2016-12-03 ENCOUNTER — Ambulatory Visit
Admission: RE | Admit: 2016-12-03 | Discharge: 2016-12-03 | Disposition: A | Discharge status: Home / Self Care | Payer: PPO | Source: Ambulatory Visit | Attending: Specialist | Admitting: Specialist

## 2016-12-03 DIAGNOSIS — R590 Localized enlarged lymph nodes: Secondary | ICD-10-CM | POA: Diagnosis not present

## 2016-12-03 DIAGNOSIS — I700 Atherosclerosis of aorta: Secondary | ICD-10-CM | POA: Insufficient documentation

## 2016-12-03 DIAGNOSIS — J84112 Idiopathic pulmonary fibrosis: Secondary | ICD-10-CM | POA: Diagnosis not present

## 2016-12-03 DIAGNOSIS — K449 Diaphragmatic hernia without obstruction or gangrene: Secondary | ICD-10-CM | POA: Insufficient documentation

## 2016-12-03 DIAGNOSIS — R911 Solitary pulmonary nodule: Secondary | ICD-10-CM | POA: Diagnosis not present

## 2016-12-03 DIAGNOSIS — R0602 Shortness of breath: Secondary | ICD-10-CM | POA: Insufficient documentation

## 2016-12-03 DIAGNOSIS — I2510 Atherosclerotic heart disease of native coronary artery without angina pectoris: Secondary | ICD-10-CM | POA: Insufficient documentation

## 2016-12-03 DIAGNOSIS — R918 Other nonspecific abnormal finding of lung field: Secondary | ICD-10-CM | POA: Diagnosis not present

## 2016-12-03 DIAGNOSIS — Z951 Presence of aortocoronary bypass graft: Secondary | ICD-10-CM | POA: Diagnosis not present

## 2016-12-09 DIAGNOSIS — I480 Paroxysmal atrial fibrillation: Secondary | ICD-10-CM | POA: Diagnosis not present

## 2016-12-09 DIAGNOSIS — I10 Essential (primary) hypertension: Secondary | ICD-10-CM | POA: Diagnosis not present

## 2016-12-09 DIAGNOSIS — I340 Nonrheumatic mitral (valve) insufficiency: Secondary | ICD-10-CM | POA: Diagnosis not present

## 2016-12-09 DIAGNOSIS — I25810 Atherosclerosis of coronary artery bypass graft(s) without angina pectoris: Secondary | ICD-10-CM | POA: Diagnosis not present

## 2016-12-15 DIAGNOSIS — G4730 Sleep apnea, unspecified: Secondary | ICD-10-CM | POA: Diagnosis not present

## 2016-12-15 DIAGNOSIS — J449 Chronic obstructive pulmonary disease, unspecified: Secondary | ICD-10-CM | POA: Diagnosis not present

## 2016-12-22 DIAGNOSIS — J84112 Idiopathic pulmonary fibrosis: Secondary | ICD-10-CM | POA: Diagnosis not present

## 2016-12-22 DIAGNOSIS — G4733 Obstructive sleep apnea (adult) (pediatric): Secondary | ICD-10-CM | POA: Diagnosis not present

## 2016-12-22 DIAGNOSIS — R0602 Shortness of breath: Secondary | ICD-10-CM | POA: Diagnosis not present

## 2017-01-12 DIAGNOSIS — Z86010 Personal history of colonic polyps: Secondary | ICD-10-CM | POA: Diagnosis not present

## 2017-01-15 DIAGNOSIS — J449 Chronic obstructive pulmonary disease, unspecified: Secondary | ICD-10-CM | POA: Diagnosis not present

## 2017-01-15 DIAGNOSIS — G4730 Sleep apnea, unspecified: Secondary | ICD-10-CM | POA: Diagnosis not present

## 2017-01-19 DIAGNOSIS — L821 Other seborrheic keratosis: Secondary | ICD-10-CM | POA: Diagnosis not present

## 2017-01-19 DIAGNOSIS — X32XXXA Exposure to sunlight, initial encounter: Secondary | ICD-10-CM | POA: Diagnosis not present

## 2017-01-19 DIAGNOSIS — Z85828 Personal history of other malignant neoplasm of skin: Secondary | ICD-10-CM | POA: Diagnosis not present

## 2017-01-19 DIAGNOSIS — L570 Actinic keratosis: Secondary | ICD-10-CM | POA: Diagnosis not present

## 2017-01-19 DIAGNOSIS — D2372 Other benign neoplasm of skin of left lower limb, including hip: Secondary | ICD-10-CM | POA: Diagnosis not present

## 2017-01-19 DIAGNOSIS — Z08 Encounter for follow-up examination after completed treatment for malignant neoplasm: Secondary | ICD-10-CM | POA: Diagnosis not present

## 2017-02-12 DIAGNOSIS — G4730 Sleep apnea, unspecified: Secondary | ICD-10-CM | POA: Diagnosis not present

## 2017-02-12 DIAGNOSIS — J449 Chronic obstructive pulmonary disease, unspecified: Secondary | ICD-10-CM | POA: Diagnosis not present

## 2017-03-12 DIAGNOSIS — M1A9XX0 Chronic gout, unspecified, without tophus (tophi): Secondary | ICD-10-CM | POA: Diagnosis not present

## 2017-03-12 DIAGNOSIS — I25810 Atherosclerosis of coronary artery bypass graft(s) without angina pectoris: Secondary | ICD-10-CM | POA: Diagnosis not present

## 2017-03-12 DIAGNOSIS — I10 Essential (primary) hypertension: Secondary | ICD-10-CM | POA: Diagnosis not present

## 2017-03-12 DIAGNOSIS — R739 Hyperglycemia, unspecified: Secondary | ICD-10-CM | POA: Diagnosis not present

## 2017-03-15 DIAGNOSIS — J449 Chronic obstructive pulmonary disease, unspecified: Secondary | ICD-10-CM | POA: Diagnosis not present

## 2017-03-15 DIAGNOSIS — G4730 Sleep apnea, unspecified: Secondary | ICD-10-CM | POA: Diagnosis not present

## 2017-03-22 DIAGNOSIS — M4696 Unspecified inflammatory spondylopathy, lumbar region: Secondary | ICD-10-CM | POA: Diagnosis not present

## 2017-03-22 DIAGNOSIS — R739 Hyperglycemia, unspecified: Secondary | ICD-10-CM | POA: Diagnosis not present

## 2017-03-22 DIAGNOSIS — J8410 Pulmonary fibrosis, unspecified: Secondary | ICD-10-CM | POA: Diagnosis not present

## 2017-03-22 DIAGNOSIS — M48061 Spinal stenosis, lumbar region without neurogenic claudication: Secondary | ICD-10-CM | POA: Diagnosis not present

## 2017-03-22 DIAGNOSIS — I739 Peripheral vascular disease, unspecified: Secondary | ICD-10-CM | POA: Diagnosis not present

## 2017-03-22 DIAGNOSIS — E782 Mixed hyperlipidemia: Secondary | ICD-10-CM | POA: Diagnosis not present

## 2017-03-22 DIAGNOSIS — I480 Paroxysmal atrial fibrillation: Secondary | ICD-10-CM | POA: Diagnosis not present

## 2017-03-22 DIAGNOSIS — M1A9XX0 Chronic gout, unspecified, without tophus (tophi): Secondary | ICD-10-CM | POA: Diagnosis not present

## 2017-03-22 DIAGNOSIS — I10 Essential (primary) hypertension: Secondary | ICD-10-CM | POA: Diagnosis not present

## 2017-03-22 DIAGNOSIS — G250 Essential tremor: Secondary | ICD-10-CM | POA: Diagnosis not present

## 2017-03-22 DIAGNOSIS — J449 Chronic obstructive pulmonary disease, unspecified: Secondary | ICD-10-CM | POA: Diagnosis not present

## 2017-03-22 DIAGNOSIS — I25810 Atherosclerosis of coronary artery bypass graft(s) without angina pectoris: Secondary | ICD-10-CM | POA: Diagnosis not present

## 2017-04-14 DIAGNOSIS — J449 Chronic obstructive pulmonary disease, unspecified: Secondary | ICD-10-CM | POA: Diagnosis not present

## 2017-04-14 DIAGNOSIS — G4730 Sleep apnea, unspecified: Secondary | ICD-10-CM | POA: Diagnosis not present

## 2017-05-15 DIAGNOSIS — G4730 Sleep apnea, unspecified: Secondary | ICD-10-CM | POA: Diagnosis not present

## 2017-05-15 DIAGNOSIS — J449 Chronic obstructive pulmonary disease, unspecified: Secondary | ICD-10-CM | POA: Diagnosis not present

## 2017-06-11 DIAGNOSIS — R0902 Hypoxemia: Secondary | ICD-10-CM | POA: Diagnosis not present

## 2017-06-11 DIAGNOSIS — J449 Chronic obstructive pulmonary disease, unspecified: Secondary | ICD-10-CM | POA: Diagnosis not present

## 2017-06-11 DIAGNOSIS — G4730 Sleep apnea, unspecified: Secondary | ICD-10-CM | POA: Diagnosis not present

## 2017-06-11 DIAGNOSIS — G4733 Obstructive sleep apnea (adult) (pediatric): Secondary | ICD-10-CM | POA: Diagnosis not present

## 2017-06-14 DIAGNOSIS — G4730 Sleep apnea, unspecified: Secondary | ICD-10-CM | POA: Diagnosis not present

## 2017-06-14 DIAGNOSIS — J449 Chronic obstructive pulmonary disease, unspecified: Secondary | ICD-10-CM | POA: Diagnosis not present

## 2017-06-22 DIAGNOSIS — I739 Peripheral vascular disease, unspecified: Secondary | ICD-10-CM | POA: Diagnosis not present

## 2017-06-22 DIAGNOSIS — G4733 Obstructive sleep apnea (adult) (pediatric): Secondary | ICD-10-CM | POA: Diagnosis not present

## 2017-06-22 DIAGNOSIS — I25810 Atherosclerosis of coronary artery bypass graft(s) without angina pectoris: Secondary | ICD-10-CM | POA: Diagnosis not present

## 2017-06-22 DIAGNOSIS — I480 Paroxysmal atrial fibrillation: Secondary | ICD-10-CM | POA: Diagnosis not present

## 2017-07-15 DIAGNOSIS — J449 Chronic obstructive pulmonary disease, unspecified: Secondary | ICD-10-CM | POA: Diagnosis not present

## 2017-07-15 DIAGNOSIS — G4730 Sleep apnea, unspecified: Secondary | ICD-10-CM | POA: Diagnosis not present

## 2017-07-27 DIAGNOSIS — H34812 Central retinal vein occlusion, left eye, with macular edema: Secondary | ICD-10-CM | POA: Diagnosis not present

## 2017-07-30 ENCOUNTER — Other Ambulatory Visit (INDEPENDENT_AMBULATORY_CARE_PROVIDER_SITE_OTHER): Payer: Self-pay | Admitting: Vascular Surgery

## 2017-07-30 DIAGNOSIS — I779 Disorder of arteries and arterioles, unspecified: Secondary | ICD-10-CM

## 2017-07-30 DIAGNOSIS — I714 Abdominal aortic aneurysm, without rupture, unspecified: Secondary | ICD-10-CM

## 2017-07-30 DIAGNOSIS — I739 Peripheral vascular disease, unspecified: Secondary | ICD-10-CM

## 2017-08-05 ENCOUNTER — Other Ambulatory Visit (INDEPENDENT_AMBULATORY_CARE_PROVIDER_SITE_OTHER): Payer: PPO

## 2017-08-05 ENCOUNTER — Encounter (INDEPENDENT_AMBULATORY_CARE_PROVIDER_SITE_OTHER): Payer: PPO

## 2017-08-05 ENCOUNTER — Encounter (INDEPENDENT_AMBULATORY_CARE_PROVIDER_SITE_OTHER): Payer: Self-pay

## 2017-08-05 ENCOUNTER — Ambulatory Visit (INDEPENDENT_AMBULATORY_CARE_PROVIDER_SITE_OTHER): Payer: Self-pay | Admitting: Vascular Surgery

## 2017-08-11 ENCOUNTER — Ambulatory Visit (INDEPENDENT_AMBULATORY_CARE_PROVIDER_SITE_OTHER): Payer: PPO

## 2017-08-11 DIAGNOSIS — I714 Abdominal aortic aneurysm, without rupture, unspecified: Secondary | ICD-10-CM

## 2017-08-11 DIAGNOSIS — I739 Peripheral vascular disease, unspecified: Secondary | ICD-10-CM | POA: Diagnosis not present

## 2017-08-11 DIAGNOSIS — I779 Disorder of arteries and arterioles, unspecified: Secondary | ICD-10-CM | POA: Diagnosis not present

## 2017-08-15 DIAGNOSIS — G4730 Sleep apnea, unspecified: Secondary | ICD-10-CM | POA: Diagnosis not present

## 2017-08-15 DIAGNOSIS — J449 Chronic obstructive pulmonary disease, unspecified: Secondary | ICD-10-CM | POA: Diagnosis not present

## 2017-09-02 ENCOUNTER — Ambulatory Visit (INDEPENDENT_AMBULATORY_CARE_PROVIDER_SITE_OTHER): Payer: PPO | Admitting: Vascular Surgery

## 2017-09-02 ENCOUNTER — Encounter (INDEPENDENT_AMBULATORY_CARE_PROVIDER_SITE_OTHER): Payer: Self-pay | Admitting: Vascular Surgery

## 2017-09-02 VITALS — BP 129/66 | HR 52 | Resp 16 | Ht 71.0 in | Wt 251.0 lb

## 2017-09-02 DIAGNOSIS — I724 Aneurysm of artery of lower extremity: Secondary | ICD-10-CM

## 2017-09-02 DIAGNOSIS — L03116 Cellulitis of left lower limb: Secondary | ICD-10-CM

## 2017-09-02 DIAGNOSIS — I739 Peripheral vascular disease, unspecified: Secondary | ICD-10-CM | POA: Diagnosis not present

## 2017-09-02 DIAGNOSIS — I6523 Occlusion and stenosis of bilateral carotid arteries: Secondary | ICD-10-CM | POA: Diagnosis not present
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
## 2017-09-09 ENCOUNTER — Encounter (INDEPENDENT_AMBULATORY_CARE_PROVIDER_SITE_OTHER): Payer: Self-pay | Admitting: Vascular Surgery

## 2017-09-13 DIAGNOSIS — M1A9XX0 Chronic gout, unspecified, without tophus (tophi): Secondary | ICD-10-CM | POA: Diagnosis not present

## 2017-09-13 DIAGNOSIS — I480 Paroxysmal atrial fibrillation: Secondary | ICD-10-CM | POA: Diagnosis not present

## 2017-09-13 DIAGNOSIS — I10 Essential (primary) hypertension: Secondary | ICD-10-CM | POA: Diagnosis not present

## 2017-09-13 DIAGNOSIS — R739 Hyperglycemia, unspecified: Secondary | ICD-10-CM | POA: Diagnosis not present

## 2017-09-13 DIAGNOSIS — I739 Peripheral vascular disease, unspecified: Secondary | ICD-10-CM | POA: Diagnosis not present

## 2017-09-13 DIAGNOSIS — Z125 Encounter for screening for malignant neoplasm of prostate: Secondary | ICD-10-CM | POA: Diagnosis not present

## 2017-09-13 DIAGNOSIS — E782 Mixed hyperlipidemia: Secondary | ICD-10-CM | POA: Diagnosis not present

## 2017-09-14 DIAGNOSIS — J449 Chronic obstructive pulmonary disease, unspecified: Secondary | ICD-10-CM | POA: Diagnosis not present

## 2017-09-14 DIAGNOSIS — G4730 Sleep apnea, unspecified: Secondary | ICD-10-CM | POA: Diagnosis not present

## 2017-09-15 ENCOUNTER — Encounter (INDEPENDENT_AMBULATORY_CARE_PROVIDER_SITE_OTHER): Payer: PPO

## 2017-09-20 DIAGNOSIS — G4733 Obstructive sleep apnea (adult) (pediatric): Secondary | ICD-10-CM | POA: Diagnosis not present

## 2017-09-20 DIAGNOSIS — M1A9XX0 Chronic gout, unspecified, without tophus (tophi): Secondary | ICD-10-CM | POA: Diagnosis not present

## 2017-09-20 DIAGNOSIS — I739 Peripheral vascular disease, unspecified: Secondary | ICD-10-CM | POA: Diagnosis not present

## 2017-09-20 DIAGNOSIS — I340 Nonrheumatic mitral (valve) insufficiency: Secondary | ICD-10-CM | POA: Diagnosis not present

## 2017-09-20 DIAGNOSIS — Z0000 Encounter for general adult medical examination without abnormal findings: Secondary | ICD-10-CM | POA: Diagnosis not present

## 2017-09-20 DIAGNOSIS — J449 Chronic obstructive pulmonary disease, unspecified: Secondary | ICD-10-CM | POA: Diagnosis not present

## 2017-09-20 DIAGNOSIS — I10 Essential (primary) hypertension: Secondary | ICD-10-CM | POA: Diagnosis not present

## 2017-09-20 DIAGNOSIS — M4696 Unspecified inflammatory spondylopathy, lumbar region: Secondary | ICD-10-CM | POA: Diagnosis not present

## 2017-09-20 DIAGNOSIS — D692 Other nonthrombocytopenic purpura: Secondary | ICD-10-CM | POA: Insufficient documentation

## 2017-09-20 DIAGNOSIS — J8410 Pulmonary fibrosis, unspecified: Secondary | ICD-10-CM | POA: Diagnosis not present

## 2017-09-20 DIAGNOSIS — I480 Paroxysmal atrial fibrillation: Secondary | ICD-10-CM | POA: Diagnosis not present

## 2017-09-20 DIAGNOSIS — G250 Essential tremor: Secondary | ICD-10-CM | POA: Diagnosis not present

## 2017-09-20 DIAGNOSIS — I25810 Atherosclerosis of coronary artery bypass graft(s) without angina pectoris: Secondary | ICD-10-CM | POA: Diagnosis not present

## 2017-09-20 DIAGNOSIS — R739 Hyperglycemia, unspecified: Secondary | ICD-10-CM | POA: Diagnosis not present

## 2017-10-01 ENCOUNTER — Encounter (INDEPENDENT_AMBULATORY_CARE_PROVIDER_SITE_OTHER): Payer: PPO

## 2017-10-01 ENCOUNTER — Ambulatory Visit (INDEPENDENT_AMBULATORY_CARE_PROVIDER_SITE_OTHER): Payer: PPO | Admitting: Vascular Surgery

## 2017-10-07 DIAGNOSIS — M25532 Pain in left wrist: Secondary | ICD-10-CM | POA: Insufficient documentation

## 2017-10-07 DIAGNOSIS — M654 Radial styloid tenosynovitis [de Quervain]: Secondary | ICD-10-CM | POA: Insufficient documentation

## 2017-10-07 DIAGNOSIS — E669 Obesity, unspecified: Secondary | ICD-10-CM | POA: Diagnosis not present

## 2017-10-15 DIAGNOSIS — J449 Chronic obstructive pulmonary disease, unspecified: Secondary | ICD-10-CM | POA: Diagnosis not present

## 2017-10-15 DIAGNOSIS — G4730 Sleep apnea, unspecified: Secondary | ICD-10-CM | POA: Diagnosis not present

## 2017-10-25 DIAGNOSIS — E669 Obesity, unspecified: Secondary | ICD-10-CM | POA: Diagnosis not present

## 2017-10-25 DIAGNOSIS — R0602 Shortness of breath: Secondary | ICD-10-CM | POA: Diagnosis not present

## 2017-10-25 DIAGNOSIS — G4733 Obstructive sleep apnea (adult) (pediatric): Secondary | ICD-10-CM | POA: Diagnosis not present

## 2017-10-25 DIAGNOSIS — J84112 Idiopathic pulmonary fibrosis: Secondary | ICD-10-CM | POA: Diagnosis not present

## 2017-10-25 DIAGNOSIS — R05 Cough: Secondary | ICD-10-CM | POA: Diagnosis not present

## 2017-10-26 ENCOUNTER — Other Ambulatory Visit: Payer: Self-pay | Admitting: Specialist

## 2017-10-26 DIAGNOSIS — J84112 Idiopathic pulmonary fibrosis: Secondary | ICD-10-CM

## 2017-10-26 DIAGNOSIS — R0602 Shortness of breath: Secondary | ICD-10-CM

## 2017-11-02 DIAGNOSIS — M1712 Unilateral primary osteoarthritis, left knee: Secondary | ICD-10-CM | POA: Diagnosis not present

## 2017-11-02 DIAGNOSIS — E669 Obesity, unspecified: Secondary | ICD-10-CM | POA: Diagnosis not present

## 2017-11-02 DIAGNOSIS — M25561 Pain in right knee: Secondary | ICD-10-CM | POA: Diagnosis not present

## 2017-11-02 DIAGNOSIS — M1711 Unilateral primary osteoarthritis, right knee: Secondary | ICD-10-CM | POA: Diagnosis not present

## 2017-11-09 ENCOUNTER — Ambulatory Visit
Admission: RE | Admit: 2017-11-09 | Discharge: 2017-11-09 | Disposition: A | Discharge status: Home / Self Care | Payer: PPO | Source: Ambulatory Visit | Attending: Specialist | Admitting: Specialist

## 2017-11-09 DIAGNOSIS — I700 Atherosclerosis of aorta: Secondary | ICD-10-CM | POA: Insufficient documentation

## 2017-11-09 DIAGNOSIS — J84112 Idiopathic pulmonary fibrosis: Secondary | ICD-10-CM | POA: Insufficient documentation

## 2017-11-09 DIAGNOSIS — R0602 Shortness of breath: Secondary | ICD-10-CM | POA: Diagnosis not present

## 2017-11-14 DIAGNOSIS — J449 Chronic obstructive pulmonary disease, unspecified: Secondary | ICD-10-CM | POA: Diagnosis not present

## 2017-11-14 DIAGNOSIS — G4730 Sleep apnea, unspecified: Secondary | ICD-10-CM | POA: Diagnosis not present

## 2017-12-15 DIAGNOSIS — J449 Chronic obstructive pulmonary disease, unspecified: Secondary | ICD-10-CM | POA: Diagnosis not present

## 2017-12-15 DIAGNOSIS — G4730 Sleep apnea, unspecified: Secondary | ICD-10-CM | POA: Diagnosis not present

## 2018-01-03 DIAGNOSIS — I10 Essential (primary) hypertension: Secondary | ICD-10-CM | POA: Diagnosis not present

## 2018-01-03 DIAGNOSIS — I25810 Atherosclerosis of coronary artery bypass graft(s) without angina pectoris: Secondary | ICD-10-CM | POA: Diagnosis not present

## 2018-01-03 DIAGNOSIS — I480 Paroxysmal atrial fibrillation: Secondary | ICD-10-CM | POA: Diagnosis not present

## 2018-01-10 DIAGNOSIS — G4733 Obstructive sleep apnea (adult) (pediatric): Secondary | ICD-10-CM | POA: Diagnosis not present

## 2018-01-15 DIAGNOSIS — G4730 Sleep apnea, unspecified: Secondary | ICD-10-CM | POA: Diagnosis not present

## 2018-01-15 DIAGNOSIS — J449 Chronic obstructive pulmonary disease, unspecified: Secondary | ICD-10-CM | POA: Diagnosis not present

## 2018-01-20 DIAGNOSIS — H35352 Cystoid macular degeneration, left eye: Secondary | ICD-10-CM | POA: Diagnosis not present

## 2018-01-27 DIAGNOSIS — J40 Bronchitis, not specified as acute or chronic: Secondary | ICD-10-CM | POA: Diagnosis not present

## 2018-01-27 DIAGNOSIS — J449 Chronic obstructive pulmonary disease, unspecified: Secondary | ICD-10-CM | POA: Diagnosis not present

## 2018-01-27 DIAGNOSIS — J8410 Pulmonary fibrosis, unspecified: Secondary | ICD-10-CM | POA: Diagnosis not present

## 2018-02-09 DIAGNOSIS — E782 Mixed hyperlipidemia: Secondary | ICD-10-CM | POA: Diagnosis not present

## 2018-02-09 DIAGNOSIS — I739 Peripheral vascular disease, unspecified: Secondary | ICD-10-CM | POA: Diagnosis not present

## 2018-02-09 DIAGNOSIS — I340 Nonrheumatic mitral (valve) insufficiency: Secondary | ICD-10-CM | POA: Diagnosis not present

## 2018-02-09 DIAGNOSIS — I480 Paroxysmal atrial fibrillation: Secondary | ICD-10-CM | POA: Diagnosis not present

## 2018-02-09 DIAGNOSIS — G4733 Obstructive sleep apnea (adult) (pediatric): Secondary | ICD-10-CM | POA: Diagnosis not present

## 2018-02-09 DIAGNOSIS — I10 Essential (primary) hypertension: Secondary | ICD-10-CM | POA: Diagnosis not present

## 2018-02-09 DIAGNOSIS — I25810 Atherosclerosis of coronary artery bypass graft(s) without angina pectoris: Secondary | ICD-10-CM | POA: Diagnosis not present

## 2018-02-12 DIAGNOSIS — J449 Chronic obstructive pulmonary disease, unspecified: Secondary | ICD-10-CM | POA: Diagnosis not present

## 2018-02-12 DIAGNOSIS — G4730 Sleep apnea, unspecified: Secondary | ICD-10-CM | POA: Diagnosis not present

## 2018-02-16 DIAGNOSIS — I25810 Atherosclerosis of coronary artery bypass graft(s) without angina pectoris: Secondary | ICD-10-CM | POA: Diagnosis not present

## 2018-02-16 DIAGNOSIS — I480 Paroxysmal atrial fibrillation: Secondary | ICD-10-CM | POA: Diagnosis not present

## 2018-02-16 DIAGNOSIS — I10 Essential (primary) hypertension: Secondary | ICD-10-CM | POA: Diagnosis not present

## 2018-03-03 ENCOUNTER — Ambulatory Visit
Admission: RE | Admit: 2018-03-03 | Discharge: 2018-03-03 | Disposition: A | Discharge status: Home / Self Care | Payer: PPO | Source: Ambulatory Visit | Attending: Internal Medicine | Admitting: Internal Medicine

## 2018-03-03 ENCOUNTER — Encounter: Payer: Self-pay | Admitting: Anesthesiology

## 2018-03-03 ENCOUNTER — Encounter
Admission: RE | Disposition: A | Discharge status: Home / Self Care | Payer: Self-pay | Source: Ambulatory Visit | Attending: Internal Medicine

## 2018-03-03 ENCOUNTER — Ambulatory Visit: Payer: PPO | Admitting: Anesthesiology

## 2018-03-03 DIAGNOSIS — Z79899 Other long term (current) drug therapy: Secondary | ICD-10-CM | POA: Diagnosis not present

## 2018-03-03 DIAGNOSIS — I480 Paroxysmal atrial fibrillation: Secondary | ICD-10-CM | POA: Diagnosis not present

## 2018-03-03 DIAGNOSIS — Z87891 Personal history of nicotine dependence: Secondary | ICD-10-CM | POA: Insufficient documentation

## 2018-03-03 DIAGNOSIS — M1990 Unspecified osteoarthritis, unspecified site: Secondary | ICD-10-CM | POA: Diagnosis not present

## 2018-03-03 DIAGNOSIS — M109 Gout, unspecified: Secondary | ICD-10-CM | POA: Diagnosis not present

## 2018-03-03 DIAGNOSIS — I10 Essential (primary) hypertension: Secondary | ICD-10-CM | POA: Diagnosis not present

## 2018-03-03 DIAGNOSIS — E785 Hyperlipidemia, unspecified: Secondary | ICD-10-CM | POA: Insufficient documentation

## 2018-03-03 DIAGNOSIS — I2510 Atherosclerotic heart disease of native coronary artery without angina pectoris: Secondary | ICD-10-CM | POA: Insufficient documentation

## 2018-03-03 DIAGNOSIS — Z86718 Personal history of other venous thrombosis and embolism: Secondary | ICD-10-CM | POA: Insufficient documentation

## 2018-03-03 DIAGNOSIS — I252 Old myocardial infarction: Secondary | ICD-10-CM | POA: Insufficient documentation

## 2018-03-03 DIAGNOSIS — J449 Chronic obstructive pulmonary disease, unspecified: Secondary | ICD-10-CM | POA: Insufficient documentation

## 2018-03-03 DIAGNOSIS — Z7901 Long term (current) use of anticoagulants: Secondary | ICD-10-CM | POA: Insufficient documentation

## 2018-03-03 DIAGNOSIS — G4730 Sleep apnea, unspecified: Secondary | ICD-10-CM | POA: Insufficient documentation

## 2018-03-03 DIAGNOSIS — I4891 Unspecified atrial fibrillation: Secondary | ICD-10-CM | POA: Diagnosis not present

## 2018-03-03 HISTORY — DX: Cardiac arrhythmia, unspecified: I49.9

## 2018-03-03 HISTORY — PX: CARDIOVERSION: EP1203

## 2018-03-03 SURGERY — CARDIOVERSION (CATH LAB)
Anesthesia: General

## 2018-03-03 MED ORDER — PROPOFOL 10 MG/ML IV BOLUS
INTRAVENOUS | Status: DC | PRN
  Administered 2018-03-03: 50 mg via INTRAVENOUS
  Administered 2018-03-03: 20 mg via INTRAVENOUS

## 2018-03-03 MED ORDER — SODIUM CHLORIDE 0.9 % IV SOLN
INTRAVENOUS | Status: DC | PRN
  Administered 2018-03-03: 08:00:00 via INTRAVENOUS

## 2018-03-03 MED ORDER — PROPOFOL 10 MG/ML IV BOLUS
INTRAVENOUS | Status: AC
  Filled 2018-03-03: qty 20

## 2018-03-03 MED ORDER — SODIUM CHLORIDE 0.9 % IV SOLN: INTRAVENOUS | Status: DC

## 2018-03-03 NOTE — Anesthesia Postprocedure Evaluation (Signed)
Anesthesia Post Note  Patient: [name].  Procedure(s) Performed: CARDIOVERSION (N/A )  Patient location during evaluation: Cath Lab Anesthesia Type: General Level of consciousness: awake and alert Pain management: pain level controlled Vital Signs Assessment: post-procedure vital signs reviewed and stable Respiratory status: spontaneous breathing, nonlabored ventilation, respiratory function stable and patient connected to nasal cannula oxygen Cardiovascular status: blood pressure returned to baseline and stable Postop Assessment: no apparent nausea or vomiting Anesthetic complications: no     Last Vitals:  Vitals:   03/03/18 0833 03/03/18 0849  BP: 121/82 (!) 145/88  Pulse: 62 66  Resp: 20 20  Temp:    SpO2:  95%    Last Pain:  Vitals:   03/03/18 0648  TempSrc: Oral                 Athol Bolds S

## 2018-03-03 NOTE — Anesthesia Post-op Follow-up Note (Signed)
Anesthesia QCDR form completed.        

## 2018-03-03 NOTE — Progress Notes (Signed)
Patient post cardioversion per Dr Nehemiah Massed, initially sinus rhythm however broke back to atril fib, Dr Nehemiah Massed speaking with patient, will plan to readdress medications in office and decide what to adjust or change. Patient awake and alert with vitals stable. Denies complaints. Discharge instructions given with questions answered.

## 2018-03-03 NOTE — Anesthesia Preprocedure Evaluation (Addendum)
Anesthesia Evaluation  Patient identified by MRN, date of birth, ID band Patient awake    Reviewed: Allergy & Precautions, NPO status , Patient's Chart, lab work & pertinent test results, reviewed documented beta blocker date and time   Airway Mallampati: III  TM Distance: >3 FB     Dental  (+) Upper Dentures, Lower Dentures   Pulmonary           Cardiovascular hypertension, Pt. on medications and Pt. on home beta blockers + dysrhythmias Atrial Fibrillation      Neuro/Psych    GI/Hepatic   Endo/Other    Renal/GU      Musculoskeletal  (+) Arthritis ,   Abdominal   Peds  Hematology   Anesthesia Other Findings Lung fibrosis.  Reproductive/Obstetrics                            Anesthesia Physical Anesthesia Plan  ASA: III  Anesthesia Plan: General   Post-op Pain Management:    Induction: Intravenous  PONV Risk Score and Plan:   Airway Management Planned:   Additional Equipment:   Intra-op Plan:   Post-operative Plan:   Informed Consent: I have reviewed the patients History and Physical, chart, labs and discussed the procedure including the risks, benefits and alternatives for the proposed anesthesia with the patient or authorized representative who has indicated his/her understanding and acceptance.     Plan Discussed with: CRNA  Anesthesia Plan Comments:         Anesthesia Quick Evaluation

## 2018-03-03 NOTE — Transfer of Care (Signed)
Immediate Anesthesia Transfer of Care Note  Patient: [name].  Procedure(s) Performed: CARDIOVERSION (N/A )  Patient Location: spu  Anesthesia Type:General  Level of Consciousness: awake, alert  and oriented  Airway & Oxygen Therapy: Patient Spontanous Breathing and Patient connected to nasal cannula oxygen  Post-op Assessment: Report given to RN and Post -op Vital signs reviewed and stable  Post vital signs: Reviewed  Last Vitals:  Vitals Value Taken Time  BP 129/75 03/03/2018  8:16 AM  Temp    Pulse 68 03/03/2018  8:16 AM  Resp 18 03/03/2018  8:16 AM  SpO2 95 % 03/03/2018  8:16 AM    Last Pain:  Vitals:   03/03/18 0648  TempSrc: Oral         Complications: No apparent anesthesia complications

## 2018-03-03 NOTE — CV Procedure (Signed)
Electrical Cardioversion Procedure Note [name] [id number] 05/11/1941  Procedure: Electrical Cardioversion Indications:  Atrial Fibrillation  Procedure Details Consent: Risks of procedure as well as the alternatives and risks of each were explained to the (patient/caregiver).  Consent for procedure obtained. Time Out: Verified patient identification, verified procedure, site/side was marked, verified correct patient position, special equipment/implants available, medications/allergies/relevent history reviewed, required imaging and test results available.  Performed  Patient placed on cardiac monitor, pulse oximetry, supplemental oxygen as necessary.  Sedation given: Short-acting barbiturates Pacer pads placed anterior and posterior chest.  Cardioverted 1 time(s).  Cardioverted at 120J.  Evaluation Findings: Post procedure EKG shows: NSR Complications: None Patient did tolerate procedure well.   [name] 03/03/2018, 8:14 AM

## 2018-03-03 NOTE — Discharge Instructions (Signed)
Chemical Cardioversion Chemical cardioversion, also called pharmacologic cardioversion, is the use of medicine to make an abnormal heart rhythm normal again. You may have this treatment if you have a new abnormal heart rhythm such as atrial fibrillation, or if your abnormal heart rhythm is causing problems such as shortness of breath. If this treatment is not successful, you may need to have a different kind of cardioversion called electrical cardioversion. Electrical cardioversion is the delivery of a jolt of electricity to restore a normal rhythm to the heart. Tell a health care provider about:  Any allergies you have.  All medicines you are taking, including vitamins, herbs, eye drops, creams, and over-the-counter medicines.  Any problems you or family members have had with anesthetic medicines.  Any blood disorders you have.  Any surgeries you have had.  Any medical conditions you have.  Whether you are pregnant or may be pregnant. What are the risks? Generally, this is a safe procedure. However, problems may occur, including:  Worsening of your abnormal heart rhythm.  An abnormal heart rhythm that is life-threatening.  A stroke from a blood clot.  What happens before the procedure?  Take over-the-counter and prescription medicines only as told by your health care provider. Your health care provider may have you start taking blood-thinning medicines (anticoagulants) so your blood does not clot easily. Your health care provider may also give you medicines to help stabilize your heart rhythm.  Ask your health care provider about changing or stopping your regular medicines. This is especially important if you are taking diabetes medicines or blood thinners.  You may have a test to look for blood clots in your heart (transesophageal echocardiogram, or TEE). In this test, a tube with an instrument is passed down the esophagus. Sound waves (ultrasound) are then used to produce very  clear, detailed images of the heart. What happens during the procedure?  You will be given one or more medicines by mouth or through an IV tube in your vein. The number of medicines will depend on your heart rhythm. You may take the medicines while you are at home, in a clinic, or in the hospital.  Sticky patches (electrodes) or metal paddles may be placed on your chest to monitor your heart. The procedure may vary among health care providers and hospitals. What happens after the procedure?  You may be asked to stay in the hospital so your heart rhythm can be monitored.  Once your heart rhythm is normal again, you may need to take medicines to increase your chances of keeping a regular heart rhythm.  Get help right away if: ? You experience side effects from medicines. ? Your heart rhythm changes. ? You have chest pain or shortness of breath. ? You have symptoms of a stroke, such as:Marland Kitchen  Sudden weakness or numbness in your face, arm, or leg, especially on one side of your body.  Sudden trouble speaking, understanding, or both (aphasia).  Sudden trouble seeing with one or both eyes. These symptoms may represent a serious problem that is an emergency. Do not wait to see if the symptoms will go away. Get medical help right away. Call your local emergency services (911 in the [place] Do not drive yourself to the hospital. Summary  Chemical cardioversion, also called pharmacologic cardioversion, is the use of medicine to make an abnormal heart rhythm normal again.  You will be given one or more medicines by mouth or through an IV tube in your vein.  Once your heart  rhythm is normal again, you may need to take medicines to increase your chances of keeping a regular heart rhythm. This information is not intended to replace advice given to you by your health care provider. Make sure you discuss any questions you have with your health care provider. Document Released: 09/14/2006 Document  Revised: 11/27/2016 Document Reviewed: 11/27/2016 Elsevier Interactive Patient Education  2017 [employer].

## 2018-03-04 ENCOUNTER — Encounter: Payer: Self-pay | Admitting: Internal Medicine

## 2018-03-07 DIAGNOSIS — I480 Paroxysmal atrial fibrillation: Secondary | ICD-10-CM | POA: Diagnosis not present

## 2018-03-07 DIAGNOSIS — I10 Essential (primary) hypertension: Secondary | ICD-10-CM | POA: Diagnosis not present

## 2018-03-07 DIAGNOSIS — I340 Nonrheumatic mitral (valve) insufficiency: Secondary | ICD-10-CM | POA: Diagnosis not present

## 2018-03-07 DIAGNOSIS — I739 Peripheral vascular disease, unspecified: Secondary | ICD-10-CM | POA: Diagnosis not present

## 2018-03-07 DIAGNOSIS — G4733 Obstructive sleep apnea (adult) (pediatric): Secondary | ICD-10-CM | POA: Diagnosis not present

## 2018-03-07 DIAGNOSIS — I25810 Atherosclerosis of coronary artery bypass graft(s) without angina pectoris: Secondary | ICD-10-CM | POA: Diagnosis not present

## 2018-03-07 DIAGNOSIS — E782 Mixed hyperlipidemia: Secondary | ICD-10-CM | POA: Diagnosis not present

## 2018-03-10 DIAGNOSIS — G4733 Obstructive sleep apnea (adult) (pediatric): Secondary | ICD-10-CM | POA: Diagnosis not present

## 2018-03-10 DIAGNOSIS — I739 Peripheral vascular disease, unspecified: Secondary | ICD-10-CM | POA: Diagnosis not present

## 2018-03-10 DIAGNOSIS — I25810 Atherosclerosis of coronary artery bypass graft(s) without angina pectoris: Secondary | ICD-10-CM | POA: Diagnosis not present

## 2018-03-10 DIAGNOSIS — I10 Essential (primary) hypertension: Secondary | ICD-10-CM | POA: Diagnosis not present

## 2018-03-10 DIAGNOSIS — I340 Nonrheumatic mitral (valve) insufficiency: Secondary | ICD-10-CM | POA: Diagnosis not present

## 2018-03-10 DIAGNOSIS — E782 Mixed hyperlipidemia: Secondary | ICD-10-CM | POA: Diagnosis not present

## 2018-03-10 DIAGNOSIS — I480 Paroxysmal atrial fibrillation: Secondary | ICD-10-CM | POA: Diagnosis not present

## 2018-03-14 DIAGNOSIS — G4733 Obstructive sleep apnea (adult) (pediatric): Secondary | ICD-10-CM | POA: Diagnosis not present

## 2018-03-14 DIAGNOSIS — I10 Essential (primary) hypertension: Secondary | ICD-10-CM | POA: Diagnosis not present

## 2018-03-14 DIAGNOSIS — R739 Hyperglycemia, unspecified: Secondary | ICD-10-CM | POA: Diagnosis not present

## 2018-03-14 DIAGNOSIS — M47816 Spondylosis without myelopathy or radiculopathy, lumbar region: Secondary | ICD-10-CM | POA: Diagnosis not present

## 2018-03-14 DIAGNOSIS — M1A9XX0 Chronic gout, unspecified, without tophus (tophi): Secondary | ICD-10-CM | POA: Diagnosis not present

## 2018-03-14 DIAGNOSIS — E782 Mixed hyperlipidemia: Secondary | ICD-10-CM | POA: Diagnosis not present

## 2018-03-15 DIAGNOSIS — G4730 Sleep apnea, unspecified: Secondary | ICD-10-CM | POA: Diagnosis not present

## 2018-03-15 DIAGNOSIS — J449 Chronic obstructive pulmonary disease, unspecified: Secondary | ICD-10-CM | POA: Diagnosis not present

## 2018-03-17 IMAGING — CT CT CHEST W/O CM
2 of 3 series · 15 of 36 positions shown, 18 images · non-contrast
Comparison: 12/03/2016

CLINICAL DATA: Shortness of breath, UIP

EXAM:
CT CHEST WITHOUT CONTRAST
TECHNIQUE: Multidetector CT imaging of the chest was performed following the
standard protocol without IV contrast.

[Series 2: soft tissue · axial · 0.76mm/px · z∈[-513,-241]mm · 12 of 160 slices shown, 15 images]
[im 12/160  mediastinal]
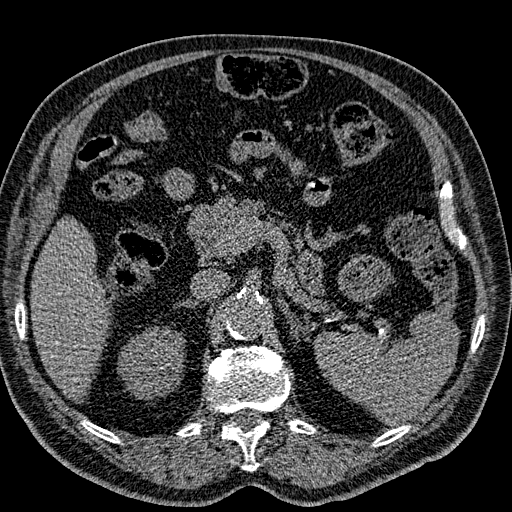
[im 12/160  lung]
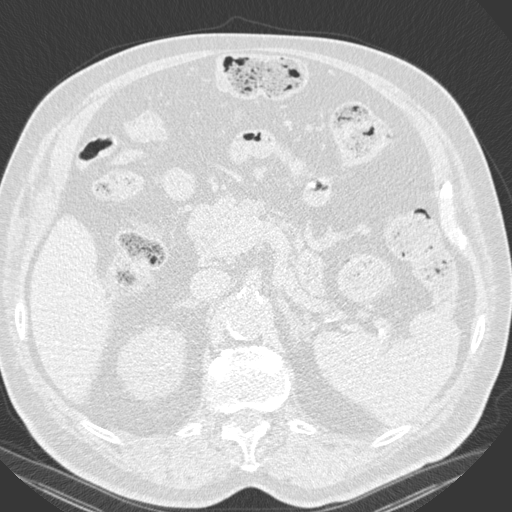
[im 24/160  lung]
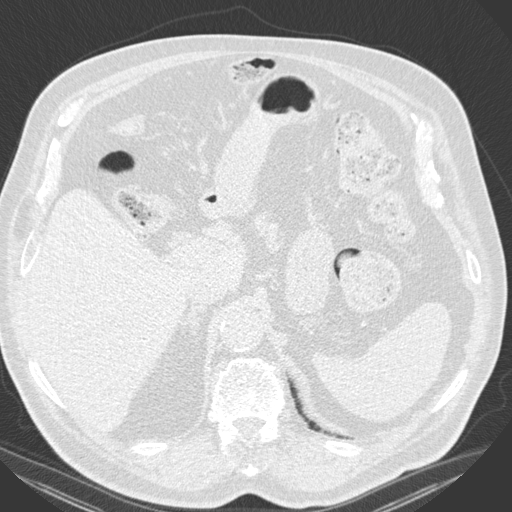
[im 36/160  lung]
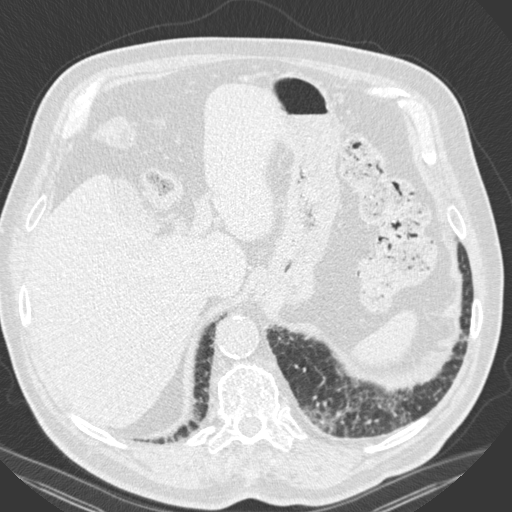
[im 48/160  lung]
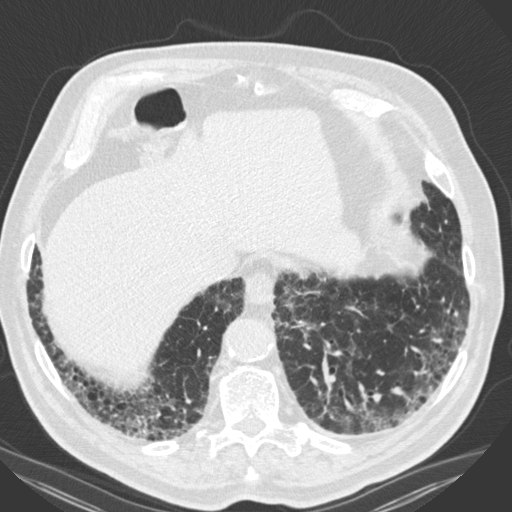
[im 59/160  mediastinal]
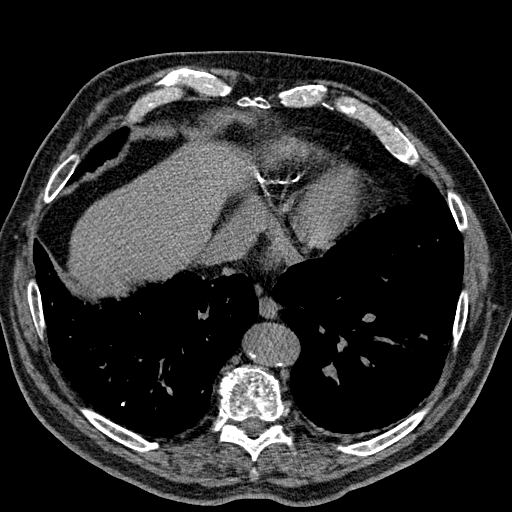
[im 59/160  lung]
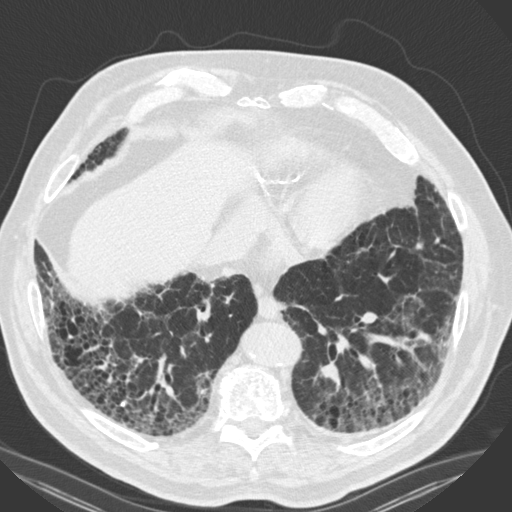
[im 71/160  lung]
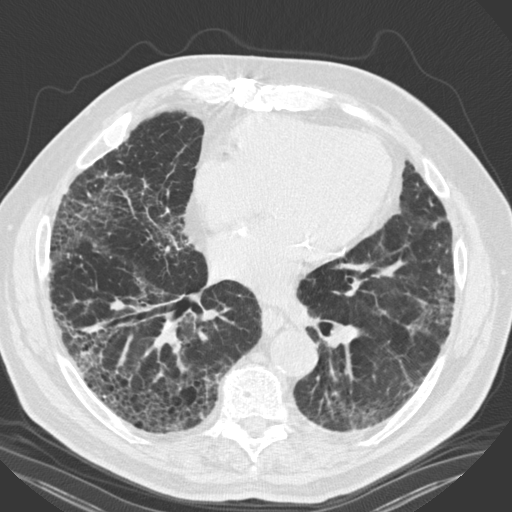
[im 89/160  lung]
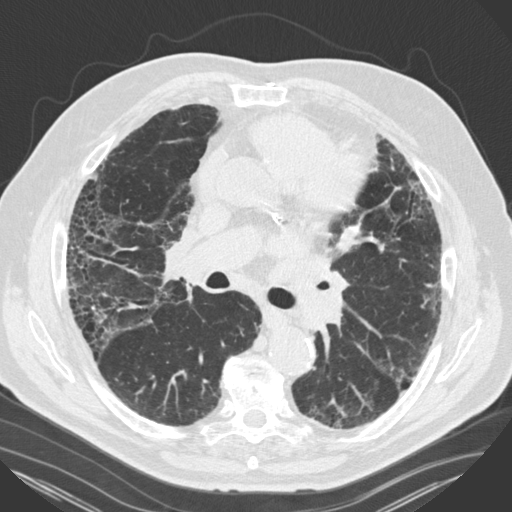
[im 101/160  lung]
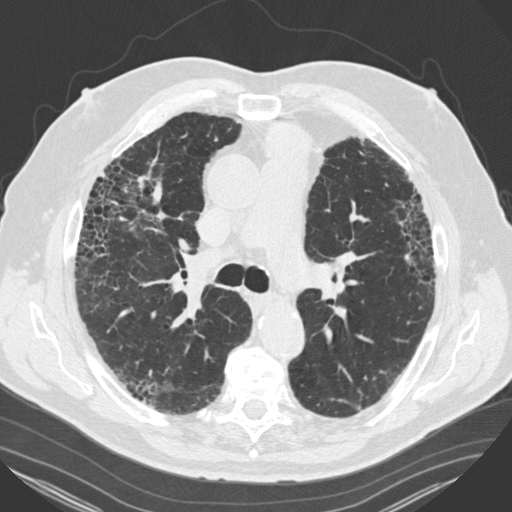
[im 112/160  mediastinal]
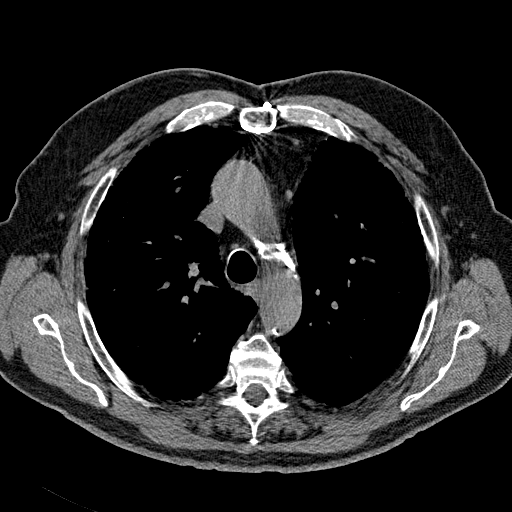
[im 112/160  lung]
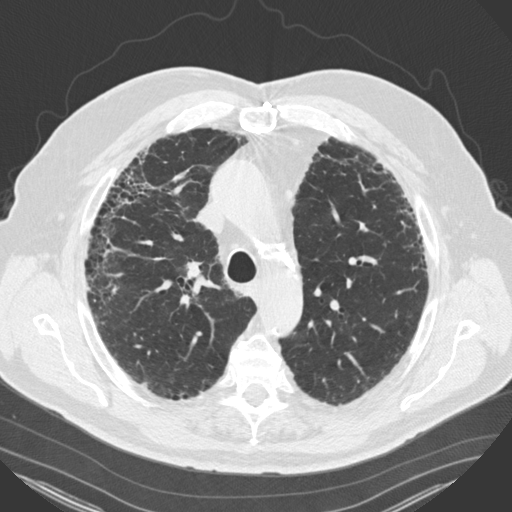
[im 124/160  lung]
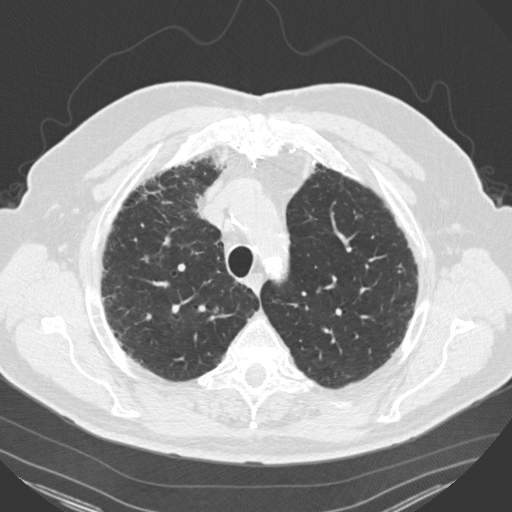
[im 136/160  lung]
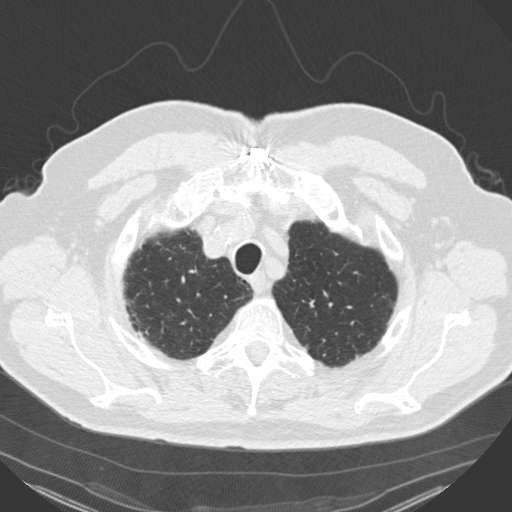
[im 148/160  lung]
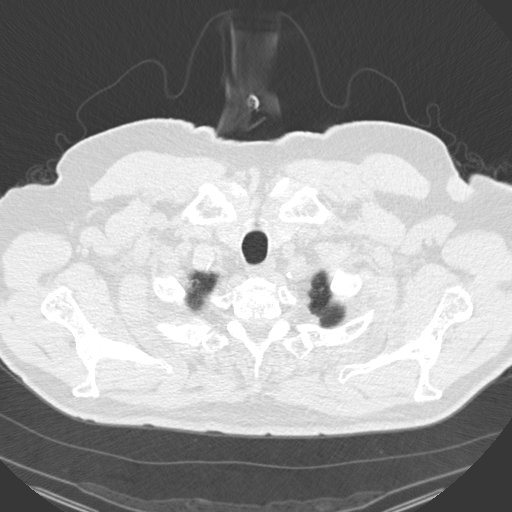

[Series 602: coronal · coronal · 0.76mm/px · 3 of 189 slices shown]
[im 38/189  lung]
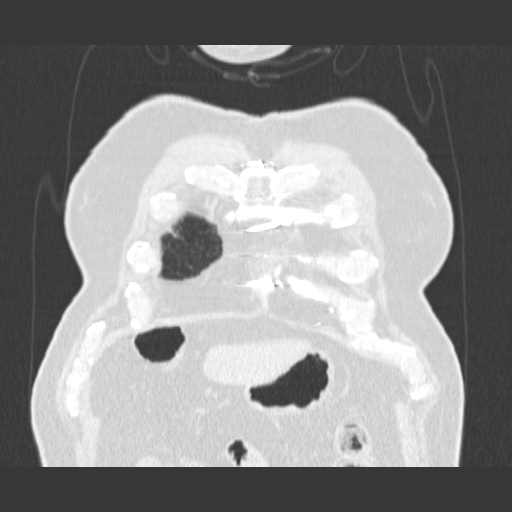
[im 76/189  lung]
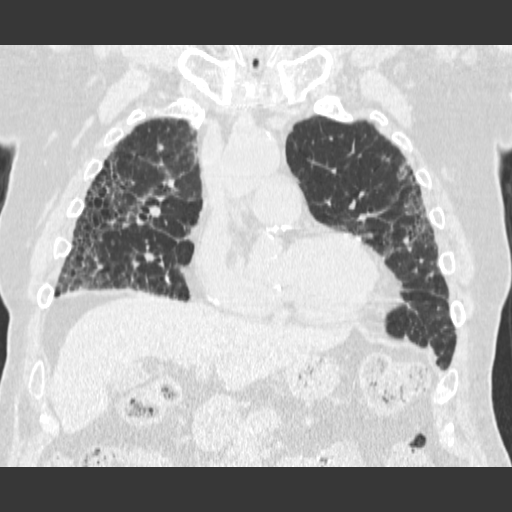
[im 113/189  lung]
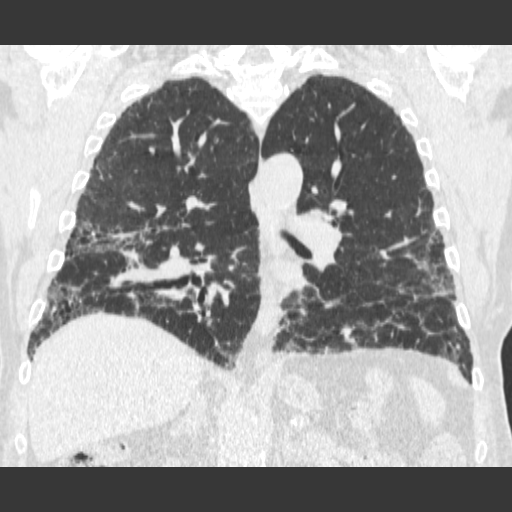

[15 of 36 positions shown; findings below may reference images not displayed]

FINDINGS: Cardiovascular: Heart is normal in size.  No pericardial effusion.

No evidence of thoracic aortic aneurysm. Atherosclerotic
calcifications of the aortic arch.

Three vessel coronary atherosclerosis.

Mediastinum/Nodes: Small mediastinal lymph nodes which do not meet
pathologic CT size criteria, likely reactive.

Probable sebaceous cyst in the left axilla (series 2/ image 117),
grossly unchanged.

Visualized thyroid is unremarkable.

Lungs/Pleura: Subpleural reticulation/ fibrosis with basilar
predominant honeycombing, favoring a UIP pattern.

5 mm left upper lobe nodule (series 3/image 37), chronic, benign.
Calcified granuloma in the left upper lobe (series 3/image 62),
benign. 6 mm triangular subpleural nodule in the left lower lobe
(series 3/ image 117), unchanged, benign. Calcified granuloma in the
right lung base (series 3/image 103), benign. Pleural-based
nodularity along the right major fissure (series 3/images 76 and
80), unchanged, benign.

No focal consolidation.

No pleural effusion or pneumothorax.

Upper Abdomen: Visualized upper abdomen is notable for a tiny hiatal
hernia.

Musculoskeletal: Mild degenerative changes of the visualized
thoracolumbar spine.

Median sternotomy.
IMPRESSION: Stable chronic interstitial lung disease, favoring a UIP pattern, as
above.

No evidence of acute cardiopulmonary disease.

Aortic Atherosclerosis ([id number].C).

## 2018-03-21 DIAGNOSIS — J449 Chronic obstructive pulmonary disease, unspecified: Secondary | ICD-10-CM | POA: Diagnosis not present

## 2018-03-21 DIAGNOSIS — D692 Other nonthrombocytopenic purpura: Secondary | ICD-10-CM | POA: Diagnosis not present

## 2018-03-21 DIAGNOSIS — I25810 Atherosclerosis of coronary artery bypass graft(s) without angina pectoris: Secondary | ICD-10-CM | POA: Diagnosis not present

## 2018-03-21 DIAGNOSIS — I480 Paroxysmal atrial fibrillation: Secondary | ICD-10-CM | POA: Diagnosis not present

## 2018-03-21 DIAGNOSIS — M1A9XX0 Chronic gout, unspecified, without tophus (tophi): Secondary | ICD-10-CM | POA: Diagnosis not present

## 2018-03-21 DIAGNOSIS — I10 Essential (primary) hypertension: Secondary | ICD-10-CM | POA: Diagnosis not present

## 2018-03-21 DIAGNOSIS — G250 Essential tremor: Secondary | ICD-10-CM | POA: Diagnosis not present

## 2018-03-21 DIAGNOSIS — G4733 Obstructive sleep apnea (adult) (pediatric): Secondary | ICD-10-CM | POA: Diagnosis not present

## 2018-03-21 DIAGNOSIS — J8410 Pulmonary fibrosis, unspecified: Secondary | ICD-10-CM | POA: Diagnosis not present

## 2018-03-21 DIAGNOSIS — I739 Peripheral vascular disease, unspecified: Secondary | ICD-10-CM | POA: Diagnosis not present

## 2018-03-21 DIAGNOSIS — M47816 Spondylosis without myelopathy or radiculopathy, lumbar region: Secondary | ICD-10-CM | POA: Diagnosis not present

## 2018-03-21 DIAGNOSIS — R739 Hyperglycemia, unspecified: Secondary | ICD-10-CM | POA: Diagnosis not present

## 2018-04-07 DIAGNOSIS — I10 Essential (primary) hypertension: Secondary | ICD-10-CM | POA: Diagnosis not present

## 2018-04-07 DIAGNOSIS — I739 Peripheral vascular disease, unspecified: Secondary | ICD-10-CM | POA: Diagnosis not present

## 2018-04-07 DIAGNOSIS — I340 Nonrheumatic mitral (valve) insufficiency: Secondary | ICD-10-CM | POA: Diagnosis not present

## 2018-04-07 DIAGNOSIS — G4733 Obstructive sleep apnea (adult) (pediatric): Secondary | ICD-10-CM | POA: Diagnosis not present

## 2018-04-07 DIAGNOSIS — I480 Paroxysmal atrial fibrillation: Secondary | ICD-10-CM | POA: Diagnosis not present

## 2018-04-07 DIAGNOSIS — I25810 Atherosclerosis of coronary artery bypass graft(s) without angina pectoris: Secondary | ICD-10-CM | POA: Diagnosis not present

## 2018-04-14 DIAGNOSIS — G4730 Sleep apnea, unspecified: Secondary | ICD-10-CM | POA: Diagnosis not present

## 2018-04-14 DIAGNOSIS — J449 Chronic obstructive pulmonary disease, unspecified: Secondary | ICD-10-CM | POA: Diagnosis not present

## 2018-04-27 DIAGNOSIS — R0609 Other forms of dyspnea: Secondary | ICD-10-CM | POA: Diagnosis not present

## 2018-04-27 DIAGNOSIS — J8410 Pulmonary fibrosis, unspecified: Secondary | ICD-10-CM | POA: Diagnosis not present

## 2018-04-27 DIAGNOSIS — G4733 Obstructive sleep apnea (adult) (pediatric): Secondary | ICD-10-CM | POA: Diagnosis not present

## 2018-04-27 DIAGNOSIS — J449 Chronic obstructive pulmonary disease, unspecified: Secondary | ICD-10-CM | POA: Diagnosis not present

## 2018-04-29 DIAGNOSIS — I480 Paroxysmal atrial fibrillation: Secondary | ICD-10-CM | POA: Diagnosis not present

## 2018-04-29 DIAGNOSIS — I481 Persistent atrial fibrillation: Secondary | ICD-10-CM | POA: Diagnosis not present

## 2018-05-05 DIAGNOSIS — G4733 Obstructive sleep apnea (adult) (pediatric): Secondary | ICD-10-CM | POA: Diagnosis not present

## 2018-05-12 DIAGNOSIS — G4733 Obstructive sleep apnea (adult) (pediatric): Secondary | ICD-10-CM | POA: Diagnosis not present

## 2018-05-12 DIAGNOSIS — I25810 Atherosclerosis of coronary artery bypass graft(s) without angina pectoris: Secondary | ICD-10-CM | POA: Diagnosis not present

## 2018-05-12 DIAGNOSIS — I739 Peripheral vascular disease, unspecified: Secondary | ICD-10-CM | POA: Diagnosis not present

## 2018-05-12 DIAGNOSIS — E782 Mixed hyperlipidemia: Secondary | ICD-10-CM | POA: Diagnosis not present

## 2018-05-12 DIAGNOSIS — I481 Persistent atrial fibrillation: Secondary | ICD-10-CM | POA: Diagnosis not present

## 2018-05-12 DIAGNOSIS — I340 Nonrheumatic mitral (valve) insufficiency: Secondary | ICD-10-CM | POA: Diagnosis not present

## 2018-05-12 DIAGNOSIS — I10 Essential (primary) hypertension: Secondary | ICD-10-CM | POA: Diagnosis not present

## 2018-05-15 DIAGNOSIS — J449 Chronic obstructive pulmonary disease, unspecified: Secondary | ICD-10-CM | POA: Diagnosis not present

## 2018-05-15 DIAGNOSIS — G4730 Sleep apnea, unspecified: Secondary | ICD-10-CM | POA: Diagnosis not present

## 2018-05-23 DIAGNOSIS — M79674 Pain in right toe(s): Secondary | ICD-10-CM | POA: Diagnosis not present

## 2018-05-23 DIAGNOSIS — M19071 Primary osteoarthritis, right ankle and foot: Secondary | ICD-10-CM | POA: Diagnosis not present

## 2018-05-23 DIAGNOSIS — M1A9XX0 Chronic gout, unspecified, without tophus (tophi): Secondary | ICD-10-CM | POA: Diagnosis not present

## 2018-05-23 DIAGNOSIS — R509 Fever, unspecified: Secondary | ICD-10-CM | POA: Diagnosis not present

## 2018-05-24 ENCOUNTER — Ambulatory Visit
Admission: RE | Admit: 2018-05-24 | Discharge: 2018-05-24 | Disposition: A | Discharge status: Home / Self Care | Payer: PPO | Source: Ambulatory Visit | Attending: Internal Medicine | Admitting: Internal Medicine

## 2018-05-24 ENCOUNTER — Encounter: Payer: Self-pay | Admitting: *Deleted

## 2018-05-24 ENCOUNTER — Encounter
Admission: RE | Disposition: A | Discharge status: Home / Self Care | Payer: Self-pay | Source: Ambulatory Visit | Attending: Internal Medicine

## 2018-05-24 ENCOUNTER — Ambulatory Visit: Payer: PPO | Admitting: Anesthesiology

## 2018-05-24 DIAGNOSIS — I10 Essential (primary) hypertension: Secondary | ICD-10-CM | POA: Diagnosis not present

## 2018-05-24 DIAGNOSIS — M109 Gout, unspecified: Secondary | ICD-10-CM | POA: Diagnosis not present

## 2018-05-24 DIAGNOSIS — I481 Persistent atrial fibrillation: Secondary | ICD-10-CM | POA: Insufficient documentation

## 2018-05-24 DIAGNOSIS — Z6834 Body mass index (BMI) 34.0-34.9, adult: Secondary | ICD-10-CM | POA: Insufficient documentation

## 2018-05-24 DIAGNOSIS — M1990 Unspecified osteoarthritis, unspecified site: Secondary | ICD-10-CM | POA: Diagnosis not present

## 2018-05-24 DIAGNOSIS — J449 Chronic obstructive pulmonary disease, unspecified: Secondary | ICD-10-CM | POA: Diagnosis not present

## 2018-05-24 DIAGNOSIS — J8410 Pulmonary fibrosis, unspecified: Secondary | ICD-10-CM | POA: Insufficient documentation

## 2018-05-24 DIAGNOSIS — G4733 Obstructive sleep apnea (adult) (pediatric): Secondary | ICD-10-CM | POA: Insufficient documentation

## 2018-05-24 DIAGNOSIS — E782 Mixed hyperlipidemia: Secondary | ICD-10-CM | POA: Diagnosis not present

## 2018-05-24 DIAGNOSIS — E669 Obesity, unspecified: Secondary | ICD-10-CM | POA: Diagnosis not present

## 2018-05-24 DIAGNOSIS — Z7901 Long term (current) use of anticoagulants: Secondary | ICD-10-CM | POA: Insufficient documentation

## 2018-05-24 DIAGNOSIS — I4891 Unspecified atrial fibrillation: Secondary | ICD-10-CM | POA: Diagnosis not present

## 2018-05-24 DIAGNOSIS — G4730 Sleep apnea, unspecified: Secondary | ICD-10-CM | POA: Diagnosis not present

## 2018-05-24 DIAGNOSIS — G250 Essential tremor: Secondary | ICD-10-CM | POA: Diagnosis not present

## 2018-05-24 DIAGNOSIS — I480 Paroxysmal atrial fibrillation: Secondary | ICD-10-CM | POA: Diagnosis not present

## 2018-05-24 DIAGNOSIS — I2510 Atherosclerotic heart disease of native coronary artery without angina pectoris: Secondary | ICD-10-CM | POA: Insufficient documentation

## 2018-05-24 DIAGNOSIS — I739 Peripheral vascular disease, unspecified: Secondary | ICD-10-CM | POA: Insufficient documentation

## 2018-05-24 HISTORY — DX: Sleep apnea, unspecified: G47.30

## 2018-05-24 HISTORY — PX: CARDIOVERSION: EP1203

## 2018-05-24 HISTORY — DX: Peripheral vascular disease, unspecified: I73.9

## 2018-05-24 SURGERY — CARDIOVERSION (CATH LAB)
Anesthesia: General

## 2018-05-24 MED ORDER — FENTANYL CITRATE (PF) 100 MCG/2ML IJ SOLN: 25.0000 ug | INTRAMUSCULAR | Status: DC | PRN

## 2018-05-24 MED ORDER — ONDANSETRON HCL 4 MG/2ML IJ SOLN: 4.0000 mg | Freq: Once | INTRAMUSCULAR | Status: DC | PRN

## 2018-05-24 MED ORDER — PROPOFOL 10 MG/ML IV BOLUS
INTRAVENOUS | Status: DC | PRN
  Administered 2018-05-24: 50 mg via INTRAVENOUS
  Administered 2018-05-24: 20 mg via INTRAVENOUS
  Administered 2018-05-24: 30 mg via INTRAVENOUS

## 2018-05-24 MED ORDER — SODIUM CHLORIDE 0.9 % WEIGHT BASED INFUSION: 1.0000 mL/kg/h | INTRAVENOUS | Status: DC

## 2018-05-24 MED ORDER — SODIUM CHLORIDE 0.9 % WEIGHT BASED INFUSION: 3.0000 mL/kg/h | INTRAVENOUS | Status: DC

## 2018-05-24 MED ORDER — SODIUM CHLORIDE 0.9 % IV SOLN
INTRAVENOUS | Status: DC
  Administered 2018-05-24: 07:00:00 via INTRAVENOUS

## 2018-05-24 NOTE — Anesthesia Post-op Follow-up Note (Signed)
Anesthesia QCDR form completed.        

## 2018-05-24 NOTE — Transfer of Care (Signed)
Immediate Anesthesia Transfer of Care Note  Patient: [name].  Procedure(s) Performed: CARDIOVERSION (N/A )  Patient Location: PACU  Anesthesia Type:General  Level of Consciousness: awake, alert  and oriented  Airway & Oxygen Therapy: Patient Spontanous Breathing and Patient connected to nasal cannula oxygen  Post-op Assessment: Report given to RN and Post -op Vital signs reviewed and stable  Post vital signs: Reviewed and stable  Last Vitals:  Vitals Value Taken Time  BP 141/86 05/24/2018  8:15 AM  Temp    Pulse 76 05/24/2018  8:19 AM  Resp 24 05/24/2018  8:19 AM  SpO2 93 % 05/24/2018  8:19 AM  Vitals shown include unvalidated device data.  Last Pain:  Vitals:   05/24/18 0815  PainSc: 0-No pain         Complications: No apparent anesthesia complications

## 2018-05-24 NOTE — CV Procedure (Signed)
Electrical Cardioversion Procedure Note Azeem Poorman [id number] September 05, 1941  Procedure: Electrical Cardioversion Indications:  Atrial Fibrillation  Procedure Details Consent: Risks of procedure as well as the alternatives and risks of each were explained to the (patient/caregiver).  Consent for procedure obtained. Time Out: Verified patient identification, verified procedure, site/side was marked, verified correct patient position, special equipment/implants available, medications/allergies/relevent history reviewed, required imaging and test results available.  Performed  Patient placed on cardiac monitor, pulse oximetry, supplemental oxygen as necessary.  Sedation given: Short-acting barbiturates Pacer pads placed anterior and posterior chest.  Cardioverted 2 time(s).  Cardioverted at 120J.  Evaluation Findings: Post procedure EKG shows: NSR both times with degeneration back to afib in 48mn Complications: None Patient did tolerate procedure well.   BCorey Skains6/18/2019, 8:14 AM

## 2018-05-24 NOTE — Anesthesia Preprocedure Evaluation (Signed)
Anesthesia Evaluation  Patient identified by MRN, date of birth, ID band Patient awake    Reviewed: Allergy & Precautions, NPO status , Patient's Chart, lab work & pertinent test results  Airway Mallampati: II  TM Distance: >3 FB     Dental   Pulmonary sleep apnea ,    Pulmonary exam normal        Cardiovascular hypertension, + Peripheral Vascular Disease  Normal cardiovascular exam+ dysrhythmias      Neuro/Psych negative neurological ROS  negative psych ROS   GI/Hepatic negative GI ROS, Neg liver ROS,   Endo/Other  negative endocrine ROS  Renal/GU negative Renal ROS     Musculoskeletal  (+) Arthritis , Osteoarthritis,    Abdominal Normal abdominal exam  (+)   Peds  Hematology negative hematology ROS (+)   Anesthesia Other Findings   Reproductive/Obstetrics                             Anesthesia Physical Anesthesia Plan  ASA: III  Anesthesia Plan: General   Post-op Pain Management:    Induction: Intravenous  PONV Risk Score and Plan:   Airway Management Planned: Nasal Cannula  Additional Equipment:   Intra-op Plan:   Post-operative Plan:   Informed Consent: I have reviewed the patients History and Physical, chart, labs and discussed the procedure including the risks, benefits and alternatives for the proposed anesthesia with the patient or authorized representative who has indicated his/her understanding and acceptance.   Dental advisory given  Plan Discussed with: CRNA and Surgeon  Anesthesia Plan Comments:         Anesthesia Quick Evaluation

## 2018-05-24 NOTE — Anesthesia Postprocedure Evaluation (Signed)
Anesthesia Post Note  Patient: [name].  Procedure(s) Performed: CARDIOVERSION (N/A )  Patient location during evaluation: Other Anesthesia Type: General Level of consciousness: awake, awake and alert and oriented Pain management: pain level controlled Vital Signs Assessment: post-procedure vital signs reviewed and stable Respiratory status: spontaneous breathing Cardiovascular status: blood pressure returned to baseline Anesthetic complications: no     Last Vitals:  Vitals:   05/24/18 0815 05/24/18 0831  BP: (!) 141/86 140/85  Pulse: 79   Resp: (!) 26 (!) 24  Temp:    SpO2: 98% 98%    Last Pain:  Vitals:   05/24/18 0831  PainSc: 0-No pain                 Murle Hellstrom

## 2018-06-05 DIAGNOSIS — G4733 Obstructive sleep apnea (adult) (pediatric): Secondary | ICD-10-CM | POA: Diagnosis not present

## 2018-06-28 DIAGNOSIS — J8410 Pulmonary fibrosis, unspecified: Secondary | ICD-10-CM | POA: Diagnosis not present

## 2018-06-28 DIAGNOSIS — J449 Chronic obstructive pulmonary disease, unspecified: Secondary | ICD-10-CM | POA: Diagnosis not present

## 2018-06-28 DIAGNOSIS — I340 Nonrheumatic mitral (valve) insufficiency: Secondary | ICD-10-CM | POA: Diagnosis not present

## 2018-06-28 DIAGNOSIS — I25810 Atherosclerosis of coronary artery bypass graft(s) without angina pectoris: Secondary | ICD-10-CM | POA: Diagnosis not present

## 2018-06-28 DIAGNOSIS — I10 Essential (primary) hypertension: Secondary | ICD-10-CM | POA: Diagnosis not present

## 2018-06-28 DIAGNOSIS — G4733 Obstructive sleep apnea (adult) (pediatric): Secondary | ICD-10-CM | POA: Diagnosis not present

## 2018-06-28 DIAGNOSIS — Z7901 Long term (current) use of anticoagulants: Secondary | ICD-10-CM | POA: Diagnosis not present

## 2018-06-28 DIAGNOSIS — I481 Persistent atrial fibrillation: Secondary | ICD-10-CM | POA: Diagnosis not present

## 2018-06-28 DIAGNOSIS — I739 Peripheral vascular disease, unspecified: Secondary | ICD-10-CM | POA: Diagnosis not present

## 2018-06-28 DIAGNOSIS — Z79899 Other long term (current) drug therapy: Secondary | ICD-10-CM | POA: Diagnosis not present

## 2018-06-29 DIAGNOSIS — I4891 Unspecified atrial fibrillation: Secondary | ICD-10-CM | POA: Diagnosis not present

## 2018-06-29 DIAGNOSIS — Z79899 Other long term (current) drug therapy: Secondary | ICD-10-CM | POA: Diagnosis not present

## 2018-06-29 DIAGNOSIS — Z87891 Personal history of nicotine dependence: Secondary | ICD-10-CM | POA: Diagnosis not present

## 2018-06-29 DIAGNOSIS — I2510 Atherosclerotic heart disease of native coronary artery without angina pectoris: Secondary | ICD-10-CM | POA: Diagnosis not present

## 2018-06-29 DIAGNOSIS — I481 Persistent atrial fibrillation: Secondary | ICD-10-CM | POA: Diagnosis not present

## 2018-06-29 DIAGNOSIS — Z951 Presence of aortocoronary bypass graft: Secondary | ICD-10-CM | POA: Diagnosis not present

## 2018-06-29 DIAGNOSIS — I25810 Atherosclerosis of coronary artery bypass graft(s) without angina pectoris: Secondary | ICD-10-CM | POA: Diagnosis not present

## 2018-06-29 DIAGNOSIS — Z7901 Long term (current) use of anticoagulants: Secondary | ICD-10-CM | POA: Diagnosis not present

## 2018-06-29 DIAGNOSIS — J449 Chronic obstructive pulmonary disease, unspecified: Secondary | ICD-10-CM | POA: Diagnosis not present

## 2018-06-29 DIAGNOSIS — Z9889 Other specified postprocedural states: Secondary | ICD-10-CM | POA: Diagnosis not present

## 2018-07-05 DIAGNOSIS — G4733 Obstructive sleep apnea (adult) (pediatric): Secondary | ICD-10-CM | POA: Diagnosis not present

## 2018-07-12 DIAGNOSIS — I25810 Atherosclerosis of coronary artery bypass graft(s) without angina pectoris: Secondary | ICD-10-CM | POA: Diagnosis not present

## 2018-07-12 DIAGNOSIS — I480 Paroxysmal atrial fibrillation: Secondary | ICD-10-CM | POA: Diagnosis not present

## 2018-07-12 DIAGNOSIS — R0602 Shortness of breath: Secondary | ICD-10-CM | POA: Diagnosis not present

## 2018-07-12 DIAGNOSIS — I481 Persistent atrial fibrillation: Secondary | ICD-10-CM | POA: Diagnosis not present

## 2018-07-12 DIAGNOSIS — R0609 Other forms of dyspnea: Secondary | ICD-10-CM | POA: Insufficient documentation

## 2018-07-12 DIAGNOSIS — R600 Localized edema: Secondary | ICD-10-CM | POA: Insufficient documentation

## 2018-07-12 DIAGNOSIS — R0600 Dyspnea, unspecified: Secondary | ICD-10-CM | POA: Insufficient documentation

## 2018-08-05 DIAGNOSIS — G4733 Obstructive sleep apnea (adult) (pediatric): Secondary | ICD-10-CM | POA: Diagnosis not present

## 2018-08-12 ENCOUNTER — Encounter: Payer: Self-pay | Admitting: Podiatry

## 2018-08-12 ENCOUNTER — Ambulatory Visit: Payer: PPO | Admitting: Podiatry

## 2018-08-12 DIAGNOSIS — L600 Ingrowing nail: Secondary | ICD-10-CM

## 2018-08-12 DIAGNOSIS — R739 Hyperglycemia, unspecified: Secondary | ICD-10-CM | POA: Insufficient documentation

## 2018-08-12 DIAGNOSIS — M109 Gout, unspecified: Secondary | ICD-10-CM | POA: Insufficient documentation

## 2018-08-12 DIAGNOSIS — I2510 Atherosclerotic heart disease of native coronary artery without angina pectoris: Secondary | ICD-10-CM | POA: Insufficient documentation

## 2018-08-12 DIAGNOSIS — I739 Peripheral vascular disease, unspecified: Secondary | ICD-10-CM | POA: Insufficient documentation

## 2018-08-12 DIAGNOSIS — E782 Mixed hyperlipidemia: Secondary | ICD-10-CM | POA: Insufficient documentation

## 2018-08-12 DIAGNOSIS — J449 Chronic obstructive pulmonary disease, unspecified: Secondary | ICD-10-CM | POA: Insufficient documentation

## 2018-08-12 DIAGNOSIS — I70219 Atherosclerosis of native arteries of extremities with intermittent claudication, unspecified extremity: Secondary | ICD-10-CM | POA: Insufficient documentation

## 2018-08-12 NOTE — Patient Instructions (Signed)

## 2018-08-14 DIAGNOSIS — I6529 Occlusion and stenosis of unspecified carotid artery: Secondary | ICD-10-CM | POA: Insufficient documentation

## 2018-08-14 NOTE — Progress Notes (Signed)
MRN : [id number]  [name]. is a 77 y.o. (1941/03/18) male who presents with chief complaint of No chief complaint on file. Marland Kitchen  History of Present Illness:  The patient is seen for follow up evaluation of carotid stenosis. The carotid stenosis followed by ultrasound.   However, today he is noting a tremendous increase in his pain as well as a marked increase in swelling of both lower extremities.  The left is more severe than the right.  He has had swelling and venous insufficiency associated with lymphedema for many years but this was an acute change that occurred over the past month.  It seems to be getting slightly worse on a day-to-day basis.  It does occur every day.  It is better in the morning and worsens throughout the day.  The pain is an aching that he rates as a 6-8 out of 10.  Again, he noticed that the pain worsens as the day progresses.  Alleviating factors are rest with elevation aggravating factors are standing.  He denies fever chills.  He denies recent trauma.  He has been trying to wear compression but is found this to be increasingly difficult because of the increased size of his legs and the pain associated with them.  The patient denies amaurosis fugax. There is no recent history of TIA symptoms or focal motor deficits. There is no prior documented CVA.  The patient is taking enteric-coated aspirin 81 mg daily.  There is no history of migraine headaches. There is no history of seizures.  The patient has a history of coronary artery disease, no recent episodes of angina or shortness of breath. The patient denies PAD or claudication symptoms. There is a history of hyperlipidemia which is being treated with a statin.    Previous carotid duplex shows less than 50% stenosis bilaterally.  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  ABIs right equals 0.95  and left equals 0.99 triphasic anterior tibial signals are maintained.  No significant change compared to the previous study.  Of note the right popliteal artery measures 1.5 cm again no significant change compared to previous study.  No outpatient medications have been marked as taking for the 08/15/18 encounter (Appointment) with Delana Meyer, Dolores Lory, MD.    Past Medical History:  Diagnosis Date  . Allergy   . Arthritis   . Dysrhythmia   . Hypertension   . Peripheral vascular disease ([place])   . Sleep apnea     Past Surgical History:  Procedure Laterality Date  . CARDIAC SURGERY    . CARDIOVERSION N/A 03/03/2018   Procedure: CARDIOVERSION;  Surgeon: [name], MD;  Location: [facility] ORS;  Service: Cardiovascular;  Laterality: N/A;  . CARDIOVERSION N/A 05/24/2018   Procedure: CARDIOVERSION;  Surgeon: [name], MD;  Location: [facility] ORS;  Service: Cardiovascular;  Laterality: N/A;  . EYE SURGERY    . LEG SURGERY Left    stents placed  . TONSILLECTOMY      Social History Social History   Tobacco Use  . Smoking status: Never Smoker  . Smokeless tobacco: Never Used  Substance Use Topics  . Alcohol use: Yes    Comment: rarely  . Drug use: No    Family History Family History  Problem Relation Age of Onset  . Thyroid disease Mother   . Macular degeneration Mother   . Cancer Father  Allergies  Allergen Reactions  . Tramadol Other (See Comments)    confusion  . Hydrochlorothiazide Other (See Comments)    worsens gout  . Iodine Rash  . Shellfish Allergy Rash     REVIEW OF SYSTEMS (Negative unless checked)  Constitutional: _0 Weight loss  _1 Fever  _2 Chills Cardiac: _3 Chest pain   _4 Chest pressure   _5 Palpitations   _6 Shortness of breath when laying flat   _7 Shortness of breath with exertion. Vascular:  _8 Pain in legs with walking   _9 Pain in legs at rest  _10 History of DVT   _11 Phlebitis   _12 Swelling in legs   _13 Varicose veins   _14 Non-healing  ulcers Pulmonary:   _15 Uses home oxygen   _16 Productive cough   _17 Hemoptysis   _18 Wheeze  _19 COPD   _20 Asthma Neurologic:  _21 Dizziness   _22 Seizures   _23 History of stroke   _24 History of TIA  _25 Aphasia   _26 Vissual changes   _27 Weakness or numbness in arm   _28 Weakness or numbness in leg Musculoskeletal:   _29 Joint swelling   _30 Joint pain   _31 Low back pain Hematologic:  _32 Easy bruising  _33 Easy bleeding   _34 Hypercoagulable state   _35 Anemic Gastrointestinal:  _36 Diarrhea   _37 Vomiting  _38 Gastroesophageal reflux/heartburn   _39 Difficulty swallowing. Genitourinary:  _40 Chronic kidney disease   _41 Difficult urination  _42 Frequent urination   _43 Blood in urine Skin:  _44 Rashes   _45 Ulcers  Psychological:  _46 History of anxiety   _47  History of major depression.  Physical Examination  There were no vitals filed for this visit. There is no height or weight on file to calculate BMI. Gen: WD/WN, NAD Head: [place]/AT, No temporalis wasting.  Ear/Nose/Throat: Hearing grossly intact, nares w/o erythema or drainage Eyes: PER, EOMI, sclera nonicteric.  Neck: Supple, no large masses.   Pulmonary:  Good air movement, no audible wheezing bilaterally, no use of accessory muscles.  Cardiac: RRR, no JVD Vascular: scattered varicosities present bilaterally.  Severe venous stasis changes to the legs bilaterally.  4+ hard nonpitting edema Vessel Right Left  Radial Palpable Palpable  PT Not Palpable Not Palpable  DP Trace Palpable Trace Palpable  Gastrointestinal: Non-distended. No guarding/no peritoneal signs.  Musculoskeletal: M/S 5/5 throughout.  No deformity or atrophy.  Neurologic: CN 2-12 intact. Symmetrical.  Speech is fluent. Motor exam as listed above. Psychiatric: Judgment intact, Mood & affect appropriate for pt's clinical situation. Dermatologic: Severe venous rashes no ulcers noted.  No changes consistent with cellulitis. Lymph : No lichenification or skin changes of chronic lymphedema.  CBC Lab Results  Component  Value Date   WBC 9.5 09/09/2015   HGB 13.1 09/09/2015   HCT 39.5 (L) 09/09/2015   MCV 86.3 09/09/2015   PLT 181 09/09/2015    BMET    Component Value Date/Time   NA 138 01/28/2012 0550   K 4.4 01/28/2012 0550   CL 103 01/28/2012 0550   CO2 26 01/28/2012 0550   GLUCOSE 110 (H) 01/28/2012 0550   BUN 19 (H) 01/28/2012 0550   CREATININE 0.85 01/28/2012 0550   CALCIUM 8.7 01/28/2012 0550   GFRNONAA >60 01/28/2012 0550   GFRAA >60 01/28/2012 0550   CrCl cannot be calculated (Patient's most recent lab result is older than the maximum 21 days allowed.).  COAG Lab Results  Component Value Date   INR 1.45 09/09/2015   INR 1.9 01/29/2012   INR 1.7 01/28/2012    Radiology No results found.   Assessment/Plan 1. Peripheral vascular disease ([place])  Recommend:  The patient has evidence of atherosclerosis of the lower extremities  with claudication.  The patient does not voice lifestyle limiting changes at this point in time.  Patient's aortic aneurysm is quite small and remains stable.  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  His bypass remains patent.  Noninvasive studies do not suggest clinically significant change.  No invasive studies, angiography or surgery at this time The patient should continue walking and begin a more formal exercise program.  The patient should continue antiplatelet therapy and aggressive treatment of the lipid abnormalities  No changes in the patient's medications at this time  The patient should continue wearing graduated compression socks 20-30 mmHg strength to control the mild edema.    2. Bilateral carotid artery stenosis Recommend:  Given the patient's asymptomatic subcritical stenosis no further invasive testing or surgery at this time.  Duplex ultrasound shows <50% stenosis bilaterally.  Continue antiplatelet therapy as prescribed Continue management of CAD, HTN and  Hyperlipidemia Healthy heart diet,  encouraged exercise at least 4 times per week  Follow up in 12 months with duplex ultrasound and physical exam   3. Lymphedema Recommend:  No surgery or intervention at this point in time.    I have reviewed my previous discussion with the patient regarding swelling and why it causes symptoms.  Patient will continue wearing graduated compression stockings class 1 (20-30 mmHg) on a daily basis. The patient will  beginning wearing the stockings first thing in the morning and removing them in the evening. The patient is instructed specifically not to sleep in the stockings.    In addition, behavioral modification including several periods of elevation of the lower extremities during the day will be continued.  This was reviewed with the patient during the initial visit.  The patient will also continue routine exercise, especially walking on a daily basis as was discussed during the initial visit.    Despite conservative treatments including graduated compression therapy class 1 and behavioral modification including exercise and elevation the patient  has not obtained adequate control of the lymphedema.  The patient still has stage 3 lymphedema and therefore, I believe that a lymph pump should be added to improve the control of the patient's lymphedema.  Additionally, a lymph pump is warranted because it will reduce the risk of cellulitis and ulceration in the future.  Patient should follow-up in six months    A total of 35 minutes was spent with this patient and greater than 50% was spent in counseling and coordination of care with the patient.  Discussion included the treatment options for vascular disease including indications for surgery and intervention.  Also discussed is the appropriate timing of treatment.  In addition medical therapy was discussed.  - VAS [place] LOWER EXTREMITY VENOUS (DVT); Future  4. Coronary artery disease of native artery of native  heart with stable angina pectoris (HCC) Continue cardiac and antihypertensive medications as already ordered and reviewed, no changes at this time.  Continue statin as ordered and reviewed, no changes at this time  Nitrates PRN for chest pain   5. Chronic obstructive pulmonary disease, unspecified COPD type ([place]) Continue pulmonary medications and aerosols as already ordered, these medications have been reviewed and there are no changes at this time.    6. Mixed hyperlipidemia Continue statin as ordered and reviewed, no changes at this time     Hortencia Pilar, MD  08/14/2018 3:43 PM

## 2018-08-15 ENCOUNTER — Ambulatory Visit (INDEPENDENT_AMBULATORY_CARE_PROVIDER_SITE_OTHER): Payer: PPO

## 2018-08-15 ENCOUNTER — Encounter (INDEPENDENT_AMBULATORY_CARE_PROVIDER_SITE_OTHER): Payer: Self-pay | Admitting: Vascular Surgery

## 2018-08-15 ENCOUNTER — Other Ambulatory Visit (INDEPENDENT_AMBULATORY_CARE_PROVIDER_SITE_OTHER): Payer: Self-pay | Admitting: Vascular Surgery

## 2018-08-15 ENCOUNTER — Ambulatory Visit (INDEPENDENT_AMBULATORY_CARE_PROVIDER_SITE_OTHER): Payer: PPO | Admitting: Vascular Surgery

## 2018-08-15 VITALS — BP 123/76 | HR 59 | Resp 16 | Ht 71.0 in | Wt 257.0 lb

## 2018-08-15 DIAGNOSIS — I714 Abdominal aortic aneurysm, without rupture, unspecified: Secondary | ICD-10-CM

## 2018-08-15 DIAGNOSIS — I739 Peripheral vascular disease, unspecified: Secondary | ICD-10-CM | POA: Diagnosis not present

## 2018-08-15 DIAGNOSIS — I6523 Occlusion and stenosis of bilateral carotid arteries: Secondary | ICD-10-CM

## 2018-08-15 DIAGNOSIS — J449 Chronic obstructive pulmonary disease, unspecified: Secondary | ICD-10-CM | POA: Diagnosis not present

## 2018-08-15 DIAGNOSIS — I890 Lymphedema, not elsewhere classified: Secondary | ICD-10-CM

## 2018-08-15 DIAGNOSIS — L728 Other follicular cysts of the skin and subcutaneous tissue: Secondary | ICD-10-CM | POA: Diagnosis not present

## 2018-08-15 DIAGNOSIS — I25118 Atherosclerotic heart disease of native coronary artery with other forms of angina pectoris: Secondary | ICD-10-CM

## 2018-08-15 DIAGNOSIS — Z08 Encounter for follow-up examination after completed treatment for malignant neoplasm: Secondary | ICD-10-CM | POA: Diagnosis not present

## 2018-08-15 DIAGNOSIS — E782 Mixed hyperlipidemia: Secondary | ICD-10-CM | POA: Diagnosis not present

## 2018-08-15 DIAGNOSIS — L821 Other seborrheic keratosis: Secondary | ICD-10-CM | POA: Diagnosis not present

## 2018-08-15 DIAGNOSIS — D2362 Other benign neoplasm of skin of left upper limb, including shoulder: Secondary | ICD-10-CM | POA: Diagnosis not present

## 2018-08-15 DIAGNOSIS — L570 Actinic keratosis: Secondary | ICD-10-CM | POA: Diagnosis not present

## 2018-08-15 DIAGNOSIS — X32XXXA Exposure to sunlight, initial encounter: Secondary | ICD-10-CM | POA: Diagnosis not present

## 2018-08-15 DIAGNOSIS — Z85828 Personal history of other malignant neoplasm of skin: Secondary | ICD-10-CM | POA: Diagnosis not present

## 2018-08-15 NOTE — Progress Notes (Signed)
Subjective: Patient presents today for evaluation of pain to the lateral border of the left hallux that began 2 weeks ago. Patient is concerned for possible ingrown nail. Walking and wearing certain shoes increases the pain. He has been using Dr. Felicie Morn pain reliever for treatment. He reports h/o nail avulsion of the left hallux. Patient presents today for further treatment and evaluation.  Past Medical History:  Diagnosis Date  . Allergy   . Arthritis   . Dysrhythmia   . Hypertension   . Peripheral vascular disease ([place])   . Sleep apnea     Objective:  General: Well developed, nourished, in no acute distress, alert and oriented x3   Dermatology: Skin is warm, dry and supple bilateral. Lateral border left hallux appears to be erythematous with evidence of an ingrowing nail. Pain on palpation noted to the border of the nail fold. The remaining nails appear unremarkable at this time. There are no open sores, lesions.  Vascular: Dorsalis Pedis artery and Posterior Tibial artery pedal pulses palpable. No lower extremity edema noted.   Neruologic: Grossly intact via light touch bilateral.  Musculoskeletal: Muscular strength within normal limits in all groups bilateral. Normal range of motion noted to all pedal and ankle joints.   Assesement: #1 Paronychia with ingrowing nail lateral border left hallux  #2 Pain in toe #3 Incurvated nail  Plan of Care:  1. Patient evaluated.  2. Discussed treatment alternatives and plan of care. Explained nail avulsion procedure and post procedure course to patient. 3. Patient opted for permanent partial nail avulsion.  4. Prior to procedure, local anesthesia infiltration utilized using 3 ml of a 50:50 mixture of 2% plain lidocaine and 0.5% plain marcaine in a normal hallux block fashion and a betadine prep performed.  5. Partial permanent nail avulsion with chemical matrixectomy performed using [id number] applications of phenol followed by alcohol  flush.  6. Light dressing applied. 7. Return to clinic in 2 weeks.   Edrick Kins, DPM [facility]  Dr. Edrick Kins, [place]                                        [place], [place] [address]                Office [phone]  Fax [phone]

## 2018-08-17 ENCOUNTER — Encounter (INDEPENDENT_AMBULATORY_CARE_PROVIDER_SITE_OTHER): Payer: Self-pay | Admitting: Vascular Surgery

## 2018-08-17 DIAGNOSIS — I890 Lymphedema, not elsewhere classified: Secondary | ICD-10-CM | POA: Insufficient documentation

## 2018-08-18 ENCOUNTER — Ambulatory Visit (INDEPENDENT_AMBULATORY_CARE_PROVIDER_SITE_OTHER): Payer: PPO | Admitting: Nurse Practitioner

## 2018-08-18 ENCOUNTER — Encounter (INDEPENDENT_AMBULATORY_CARE_PROVIDER_SITE_OTHER): Payer: Self-pay | Admitting: Nurse Practitioner

## 2018-08-18 ENCOUNTER — Ambulatory Visit (INDEPENDENT_AMBULATORY_CARE_PROVIDER_SITE_OTHER): Payer: PPO

## 2018-08-18 VITALS — BP 132/84 | HR 74 | Resp 16 | Ht 71.0 in | Wt 257.0 lb

## 2018-08-18 DIAGNOSIS — E782 Mixed hyperlipidemia: Secondary | ICD-10-CM

## 2018-08-18 DIAGNOSIS — I890 Lymphedema, not elsewhere classified: Secondary | ICD-10-CM

## 2018-08-18 DIAGNOSIS — I739 Peripheral vascular disease, unspecified: Secondary | ICD-10-CM

## 2018-08-18 DIAGNOSIS — J449 Chronic obstructive pulmonary disease, unspecified: Secondary | ICD-10-CM | POA: Diagnosis not present

## 2018-08-19 ENCOUNTER — Encounter (INDEPENDENT_AMBULATORY_CARE_PROVIDER_SITE_OTHER): Payer: Self-pay | Admitting: Nurse Practitioner

## 2018-08-19 NOTE — Progress Notes (Signed)
Subjective:    Patient ID: Collin Cornelia., male    DOB: [birth year], 77 y.o.   MRN: [id number] Chief Complaint  Patient presents with  . Follow-up    Follow up DVT U/S    HPI  Collin Conde. is a 77 y.o. male returns for follow-up with leg swelling.  The left leg greater than the right leg.  The patient has had a history of DVT.  The patient currently has lymphedema and utilizes medical grade 1 compression stockings.  However this leg has been swelling more than usual with increased pain.  Patient denies any fever, chills, nausea, vomiting, diarrhea.  The patient denies any travel for long distances.  Patient denies any chest pain or shortness of breath.  Currently denies any claudication-like symptoms or rest pain.    The patient underwent a lower extremity DVT study there is no evidence of DVT in either lower extremity.  There is no evidence of superficial venous thrombosis in either lower extremity.  The patient does have a Baker's cyst visualized behind the right posterior knee.  Constitutional: _0 Weight loss  _1 Fever  _2 Chills Cardiac: _3 Chest pain   _4 Chest pressure   _5 Palpitations   _6 Shortness of breath when laying flat   _7 Shortness of breath with exertion. Vascular:  _8 Pain in legs with walking   _9 Pain in legs with standing  _10 History of DVT   _11 Phlebitis   _12 Swelling in legs   _13 Varicose veins   _14 Non-healing ulcers Pulmonary:   _15 Uses home oxygen   _16 Productive cough   _17 Hemoptysis   _18 Wheeze  _19 COPD   _20 Asthma Neurologic:  _21 Dizziness   _22 Seizures   _23 History of stroke   _24 History of TIA  _25 Aphasia   _26 Vissual changes   _27 Weakness or numbness in arm   _28 Weakness or numbness in leg Musculoskeletal:   _29 Joint swelling   _30 Joint pain   _31 Low back pain Hematologic:  _32 Easy bruising  _33 Easy bleeding   _34 Hypercoagulable state   _35 Anemic Gastrointestinal:  _36 Diarrhea   _37 Vomiting  _38 Gastroesophageal reflux/heartburn   _39 Difficulty swallowing. Genitourinary:   _40 Chronic kidney disease   _41 Difficult urination  _42 Frequent urination   _43 Blood in urine Skin:  _44 Rashes   _45 Ulcers  Psychological:  _46 History of anxiety   _47  History of major depression.     Objective:   Physical Exam  BP 132/84 (BP Location: Right Arm, Patient Position: Sitting)   Pulse 74   Resp 16   Ht _48  (1.803 m)   Wt 257 lb (116.6 kg)   BMI 35.84 kg/m   Past Medical History:  Diagnosis Date  . Allergy   . Arthritis   . Dysrhythmia   . Hypertension   . Peripheral vascular disease ([place])   . Sleep apnea      Gen: WD/WN, NAD Head: [name], No temporalis wasting.  Ear/Nose/Throat: Hearing grossly intact, nares w/o erythema or drainage Eyes: PER, EOMI, sclera nonicteric.  Neck: Supple, no masses.  No JVD.  Pulmonary:  Good air movement, no use of accessory muscles.  Cardiac: RRR Vascular:  Bilateral swelling of the lower extremities, 1+ pitting edema Vessel Right Left  Radial  palpable  palpable  DP  trace palpable  trace palpable  Gastrointestinal: soft, non-distended. No guarding/no peritoneal signs.  Musculoskeletal: M/S 5/5 throughout.  No deformity or atrophy.  Neurologic: Pain and light touch intact in extremities.  Symmetrical.  Speech is fluent. Motor exam as listed above. Psychiatric: Judgment intact, Mood & affect appropriate for pt's clinical situation. Dermatologic:  Stasis dermatitis.  No changes consistent with cellulitis. Lymph : Dermal thickening present.  No lichenification  Social History   Socioeconomic History  . Marital status: Married    Spouse name: Not on file  . Number of children: Not on file  . Years of education: Not on file  . Highest education level: Not on file  Occupational History  . Not on file  Social Needs  . Financial resource strain: Not on file  . Food insecurity:    Worry: Not on file    Inability: Not on file  . Transportation needs:    Medical: Not on file    Non-medical: Not on file  Tobacco Use  . Smoking  status: Never Smoker  . Smokeless tobacco: Never Used  Substance and Sexual Activity  . Alcohol use: Yes    Comment: rarely  . Drug use: No  . Sexual activity: Not on file  Lifestyle  . Physical activity:    Days per week: Not on file    Minutes per session: Not on file  . Stress: Not on file  Relationships  . Social connections:    Talks on phone: Not on file    Gets together: Not on file    Attends religious service: Not on file    Active member of club or organization: Not on file    Attends meetings of clubs or organizations: Not on file    Relationship status: Not on file  . Intimate partner violence:    Fear of current or ex partner: Not on file    Emotionally abused: Not on file    Physically abused: Not on file    Forced sexual activity: Not on file  Other Topics Concern  . Not on file  Social History Narrative  . Not on file    Past Surgical History:  Procedure Laterality Date  . CARDIAC SURGERY    . CARDIOVERSION N/A 03/03/2018   Procedure: CARDIOVERSION;  Surgeon: [name], MD;  Location: [facility] ORS;  Service: Cardiovascular;  Laterality: N/A;  . CARDIOVERSION N/A 05/24/2018   Procedure: CARDIOVERSION;  Surgeon: [name], MD;  Location: [facility] ORS;  Service: Cardiovascular;  Laterality: N/A;  . EYE SURGERY    . LEG SURGERY Left    stents placed  . TONSILLECTOMY      Family History  Problem Relation Age of Onset  . Thyroid disease Mother   . Macular degeneration Mother   . Cancer Father     Allergies  Allergen Reactions  . Tramadol Other (See Comments)    confusion  . Hydrochlorothiazide Other (See Comments)    worsens gout  . Iodine Rash  . Shellfish Allergy Rash       Assessment & Plan:   1. Lymphedema Recommend:  No surgery or intervention at this point in time.    I have reviewed my previous discussion with the patient regarding swelling and why it causes symptoms.  Patient will continue wearing graduated compression  stockings class 1 (20-30 mmHg) on a daily basis. The patient will  beginning wearing the stockings first thing in the morning and removing them in the evening. The patient is instructed specifically not to sleep in the stockings.    In addition, behavioral modification including several periods of elevation of the lower extremities during the day will be continued.  This was reviewed with the patient during the initial visit.  The patient will also continue routine exercise, especially walking on a daily  basis as was discussed during the initial visit.    Despite conservative treatments including graduated compression therapy class 1 and behavioral modification including exercise and elevation the patient  has not obtained adequate control of the lymphedema.  The patient still has stage 3 lymphedema and therefore, I believe that a lymph pump should be added to improve the control of the patient's lymphedema.  Additionally, a lymph pump is warranted because it will reduce the risk of cellulitis and ulceration in the future.  Patient should follow-up in six months    2. Peripheral vascular disease [facility]) Patient recently followed up with Dr. Delana Meyer.  Not having any current exacerbations of his peripheral artery disease.  Will continue on follow-up schedule.  3. Mixed hyperlipidemia Continue statin as ordered and reviewed, no changes at this time   4. Chronic obstructive pulmonary disease, unspecified COPD type ([place]) Continue pulmonary medications and aerosols as already ordered, these medications have been reviewed and there are no changes at this time.     Current Outpatient Medications on File Prior to Visit  Medication Sig Dispense Refill  . acetaminophen (TYLENOL) 650 MG CR tablet Take 1,300 mg by mouth every 8 (eight) hours as needed for pain.     Marland Kitchen ALPRAZolam (XANAX) 0.25 MG tablet Take 0.25 mg by mouth 3 (three) times daily as needed for anxiety.  5  . amiodarone (PACERONE) 200 MG  tablet Take 200 mg by mouth daily.   3  . amLODipine (NORVASC) 5 MG tablet Take 5 mg by mouth daily.    . benazepril (LOTENSIN) 40 MG tablet Take 40 mg by mouth daily.     Marland Kitchen diltiazem (CARDIZEM CD) 120 MG 24 hr capsule Take by mouth.    Arne [place] 5 MG TABS tablet Take 5 mg by mouth 2 (two) times daily.     . furosemide (LASIX) 40 MG tablet TAKE 40 MG BY MOUTH ONCE DAILY AS NEEDED FOR EDEMA    . Naphazoline HCl (CLEAR EYES OP) Place 1 drop into both eyes daily as needed (for dry eyes).    Marland Kitchen PRILOSEC OTC 20 MG tablet Take 20 mg by mouth daily as needed (for acid reflux).   11  . vitamin B-12 (CYANOCOBALAMIN) 1000 MCG tablet Take 1,000 mcg by mouth daily.      No current facility-administered medications on file prior to visit.     There are no Patient Instructions on file for this visit. No follow-ups on file.   Kris Hartmann, NP

## 2018-08-26 ENCOUNTER — Encounter: Payer: Self-pay | Admitting: Podiatry

## 2018-08-26 ENCOUNTER — Ambulatory Visit: Payer: PPO | Admitting: Podiatry

## 2018-08-26 DIAGNOSIS — L600 Ingrowing nail: Secondary | ICD-10-CM | POA: Diagnosis not present

## 2018-08-29 NOTE — Progress Notes (Signed)
   Subjective: Patient presents today 2 weeks post ingrown nail permanent nail avulsion procedure of the lateral border of the left hallux. Patient states that the toe and nail fold is feeling much better. Patient is here for further evaluation and treatment.   Past Medical History:  Diagnosis Date  . Allergy   . Arthritis   . Dysrhythmia   . Hypertension   . Peripheral vascular disease ([place])   . Sleep apnea     Objective: Skin is warm, dry and supple. Nail and respective nail fold appears to be healing appropriately. Open wound to the associated nail fold with a granular wound base and moderate amount of fibrotic tissue. Minimal drainage noted. Mild erythema around the periungual region likely due to phenol chemical matricectomy.  Assessment: #1 postop permanent partial nail avulsion lateral border left hallux  #2 open wound periungual nail fold of respective digit.   Plan of care: #1 patient was evaluated  #2 debridement of open wound was performed to the periungual border of the respective toe using a currette. Antibiotic ointment and Band-Aid was applied. #3 patient is to return to clinic on a PRN basis.   Edrick Kins, DPM [facility]  Dr. Edrick Kins, [address]                                        [place], [place] [address]                Office [phone]  Fax [phone]

## 2018-09-05 DIAGNOSIS — G4733 Obstructive sleep apnea (adult) (pediatric): Secondary | ICD-10-CM | POA: Diagnosis not present

## 2018-09-07 DIAGNOSIS — Z79899 Other long term (current) drug therapy: Secondary | ICD-10-CM | POA: Diagnosis not present

## 2018-09-07 DIAGNOSIS — I4819 Other persistent atrial fibrillation: Secondary | ICD-10-CM | POA: Diagnosis not present

## 2018-09-07 DIAGNOSIS — E782 Mixed hyperlipidemia: Secondary | ICD-10-CM | POA: Diagnosis not present

## 2018-09-07 DIAGNOSIS — Z5181 Encounter for therapeutic drug level monitoring: Secondary | ICD-10-CM | POA: Diagnosis not present

## 2018-09-07 DIAGNOSIS — R600 Localized edema: Secondary | ICD-10-CM | POA: Diagnosis not present

## 2018-09-07 DIAGNOSIS — I25810 Atherosclerosis of coronary artery bypass graft(s) without angina pectoris: Secondary | ICD-10-CM | POA: Diagnosis not present

## 2018-09-13 DIAGNOSIS — I4819 Other persistent atrial fibrillation: Secondary | ICD-10-CM | POA: Diagnosis not present

## 2018-09-19 DIAGNOSIS — M1A9XX0 Chronic gout, unspecified, without tophus (tophi): Secondary | ICD-10-CM | POA: Diagnosis not present

## 2018-09-19 DIAGNOSIS — I739 Peripheral vascular disease, unspecified: Secondary | ICD-10-CM | POA: Diagnosis not present

## 2018-09-19 DIAGNOSIS — I10 Essential (primary) hypertension: Secondary | ICD-10-CM | POA: Diagnosis not present

## 2018-09-19 DIAGNOSIS — E782 Mixed hyperlipidemia: Secondary | ICD-10-CM | POA: Diagnosis not present

## 2018-09-19 DIAGNOSIS — I480 Paroxysmal atrial fibrillation: Secondary | ICD-10-CM | POA: Diagnosis not present

## 2018-09-19 DIAGNOSIS — I25810 Atherosclerosis of coronary artery bypass graft(s) without angina pectoris: Secondary | ICD-10-CM | POA: Diagnosis not present

## 2018-09-19 DIAGNOSIS — Z125 Encounter for screening for malignant neoplasm of prostate: Secondary | ICD-10-CM | POA: Diagnosis not present

## 2018-09-19 DIAGNOSIS — R739 Hyperglycemia, unspecified: Secondary | ICD-10-CM | POA: Diagnosis not present

## 2018-09-19 DIAGNOSIS — D692 Other nonthrombocytopenic purpura: Secondary | ICD-10-CM | POA: Diagnosis not present

## 2018-09-26 DIAGNOSIS — I340 Nonrheumatic mitral (valve) insufficiency: Secondary | ICD-10-CM | POA: Diagnosis not present

## 2018-09-26 DIAGNOSIS — J449 Chronic obstructive pulmonary disease, unspecified: Secondary | ICD-10-CM | POA: Diagnosis not present

## 2018-09-26 DIAGNOSIS — I10 Essential (primary) hypertension: Secondary | ICD-10-CM | POA: Diagnosis not present

## 2018-09-26 DIAGNOSIS — R600 Localized edema: Secondary | ICD-10-CM | POA: Diagnosis not present

## 2018-09-26 DIAGNOSIS — M1A9XX0 Chronic gout, unspecified, without tophus (tophi): Secondary | ICD-10-CM | POA: Diagnosis not present

## 2018-09-26 DIAGNOSIS — I25810 Atherosclerosis of coronary artery bypass graft(s) without angina pectoris: Secondary | ICD-10-CM | POA: Diagnosis not present

## 2018-09-26 DIAGNOSIS — I739 Peripheral vascular disease, unspecified: Secondary | ICD-10-CM | POA: Diagnosis not present

## 2018-09-26 DIAGNOSIS — Z23 Encounter for immunization: Secondary | ICD-10-CM | POA: Diagnosis not present

## 2018-09-26 DIAGNOSIS — I4819 Other persistent atrial fibrillation: Secondary | ICD-10-CM | POA: Diagnosis not present

## 2018-09-26 DIAGNOSIS — D692 Other nonthrombocytopenic purpura: Secondary | ICD-10-CM | POA: Diagnosis not present

## 2018-09-26 DIAGNOSIS — G250 Essential tremor: Secondary | ICD-10-CM | POA: Diagnosis not present

## 2018-09-26 DIAGNOSIS — G4733 Obstructive sleep apnea (adult) (pediatric): Secondary | ICD-10-CM | POA: Diagnosis not present

## 2018-09-26 DIAGNOSIS — Z0000 Encounter for general adult medical examination without abnormal findings: Secondary | ICD-10-CM | POA: Diagnosis not present

## 2018-09-26 DIAGNOSIS — J8410 Pulmonary fibrosis, unspecified: Secondary | ICD-10-CM | POA: Diagnosis not present

## 2018-09-28 ENCOUNTER — Other Ambulatory Visit: Payer: Self-pay | Admitting: Specialist

## 2018-09-28 DIAGNOSIS — J849 Interstitial pulmonary disease, unspecified: Secondary | ICD-10-CM | POA: Diagnosis not present

## 2018-09-28 DIAGNOSIS — I480 Paroxysmal atrial fibrillation: Secondary | ICD-10-CM | POA: Diagnosis not present

## 2018-09-28 DIAGNOSIS — G4733 Obstructive sleep apnea (adult) (pediatric): Secondary | ICD-10-CM | POA: Diagnosis not present

## 2018-09-28 DIAGNOSIS — R0609 Other forms of dyspnea: Secondary | ICD-10-CM | POA: Diagnosis not present

## 2018-10-05 ENCOUNTER — Other Ambulatory Visit (INDEPENDENT_AMBULATORY_CARE_PROVIDER_SITE_OTHER): Payer: Self-pay | Admitting: Nurse Practitioner

## 2018-10-05 ENCOUNTER — Telehealth (INDEPENDENT_AMBULATORY_CARE_PROVIDER_SITE_OTHER): Payer: Self-pay

## 2018-10-05 ENCOUNTER — Encounter (INDEPENDENT_AMBULATORY_CARE_PROVIDER_SITE_OTHER): Payer: PPO

## 2018-10-05 DIAGNOSIS — G4733 Obstructive sleep apnea (adult) (pediatric): Secondary | ICD-10-CM | POA: Diagnosis not present

## 2018-10-05 DIAGNOSIS — M7989 Other specified soft tissue disorders: Secondary | ICD-10-CM

## 2018-10-05 DIAGNOSIS — R238 Other skin changes: Secondary | ICD-10-CM

## 2018-10-05 NOTE — Telephone Encounter (Signed)
See when we can get him today or tomorrow for a scan of his left leg and follow up appointment. Thanks

## 2018-10-05 NOTE — Telephone Encounter (Signed)
Patient will be coming in tomorrow for ultrasound and follow up with Arna Medici NP on 10/06/18

## 2018-10-06 ENCOUNTER — Ambulatory Visit (INDEPENDENT_AMBULATORY_CARE_PROVIDER_SITE_OTHER): Payer: PPO | Admitting: Nurse Practitioner

## 2018-10-06 ENCOUNTER — Ambulatory Visit (INDEPENDENT_AMBULATORY_CARE_PROVIDER_SITE_OTHER): Payer: PPO

## 2018-10-06 ENCOUNTER — Encounter (INDEPENDENT_AMBULATORY_CARE_PROVIDER_SITE_OTHER): Payer: Self-pay | Admitting: Nurse Practitioner

## 2018-10-06 VITALS — BP 154/86 | HR 68 | Resp 18 | Ht 71.0 in | Wt 254.0 lb

## 2018-10-06 DIAGNOSIS — I890 Lymphedema, not elsewhere classified: Secondary | ICD-10-CM

## 2018-10-06 DIAGNOSIS — R600 Localized edema: Secondary | ICD-10-CM | POA: Diagnosis not present

## 2018-10-06 DIAGNOSIS — E782 Mixed hyperlipidemia: Secondary | ICD-10-CM

## 2018-10-06 DIAGNOSIS — J449 Chronic obstructive pulmonary disease, unspecified: Secondary | ICD-10-CM

## 2018-10-06 DIAGNOSIS — R238 Other skin changes: Secondary | ICD-10-CM | POA: Diagnosis not present

## 2018-10-06 DIAGNOSIS — L03116 Cellulitis of left lower limb: Secondary | ICD-10-CM | POA: Diagnosis not present

## 2018-10-06 DIAGNOSIS — M7989 Other specified soft tissue disorders: Secondary | ICD-10-CM

## 2018-10-06 MED ORDER — DOXYCYCLINE HYCLATE 100 MG PO CAPS: 100.0000 mg | ORAL_CAPSULE | Freq: Two times a day (BID) | ORAL | 0 refills | Status: DC

## 2018-10-07 ENCOUNTER — Encounter (INDEPENDENT_AMBULATORY_CARE_PROVIDER_SITE_OTHER): Payer: Self-pay | Admitting: Nurse Practitioner

## 2018-10-07 NOTE — Progress Notes (Signed)
Subjective:    Patient ID: Collin Cornelia., male    DOB: May 31, 1941, 77 y.o.   MRN: [id number] Chief Complaint  Patient presents with  . Follow-up    LLE DVT follow up    HPI  [name]. is a 77 y.o. male that presents today with concerns for DVT and infection of the left lower extremity.  He has previously had episodes of cellulitis and DVT in the lower extremity before.  His wife states that at the end of the day it becomes very erythematous, hot, and boggy feeling.  The patient works most days standing or sitting.  This is been ongoing for the last 2 to 3 days.  The patient denies any rest pain or claudication-like symptoms.  The patient denies any chest pain or shortness of breath.  Patient denies any amaurosis fugax or TIA-like symptoms.  The patient underwent a left lower extremity DVT study which was negative for DVT or superficial venous thrombosis.  Past Medical History:  Diagnosis Date  . Allergy   . Arthritis   . Dysrhythmia   . Hypertension   . Peripheral vascular disease ([place])   . Sleep apnea     Past Surgical History:  Procedure Laterality Date  . CARDIAC SURGERY    . CARDIOVERSION N/A 03/03/2018   Procedure: CARDIOVERSION;  Surgeon: [name], MD;  Location: [facility] ORS;  Service: Cardiovascular;  Laterality: N/A;  . CARDIOVERSION N/A 05/24/2018   Procedure: CARDIOVERSION;  Surgeon: [name], MD;  Location: [facility] ORS;  Service: Cardiovascular;  Laterality: N/A;  . EYE SURGERY    . LEG SURGERY Left    stents placed  . TONSILLECTOMY      Social History   Socioeconomic History  . Marital status: Married    Spouse name: Not on file  . Number of children: Not on file  . Years of education: Not on file  . Highest education level: Not on file  Occupational History  . Not on file  Social Needs  . Financial resource strain: Not on file  . Food insecurity:    Worry: Not on file    Inability: Not on file  . Transportation  needs:    Medical: Not on file    Non-medical: Not on file  Tobacco Use  . Smoking status: Never Smoker  . Smokeless tobacco: Never Used  Substance and Sexual Activity  . Alcohol use: Yes    Comment: rarely  . Drug use: No  . Sexual activity: Not on file  Lifestyle  . Physical activity:    Days per week: Not on file    Minutes per session: Not on file  . Stress: Not on file  Relationships  . Social connections:    Talks on phone: Not on file    Gets together: Not on file    Attends religious service: Not on file    Active member of club or organization: Not on file    Attends meetings of clubs or organizations: Not on file    Relationship status: Not on file  . Intimate partner violence:    Fear of current or ex partner: Not on file    Emotionally abused: Not on file    Physically abused: Not on file    Forced sexual activity: Not on file  Other Topics Concern  . Not on file  Social History Narrative  . Not on file    Family History  Problem Relation Age  of Onset  . Thyroid disease Mother   . Macular degeneration Mother   . Cancer Father     Allergies  Allergen Reactions  . Tramadol Other (See Comments)    confusion  . Hydrochlorothiazide Other (See Comments)    worsens gout  . Iodine Rash  . Shellfish Allergy Rash     Review of Systems   Review of Systems: Negative Unless Checked Constitutional: _0 Weight loss  _1 Fever  _2 Chills Cardiac: _3 Chest pain   _4  Atrial Fibrillation  _5 Palpitations   _6 Shortness of breath when laying flat   _7 Shortness of breath with exertion. Vascular:  _8 Pain in legs with walking   _9 Pain in legs with standing  _10 History of DVT   _11 Phlebitis   _12 Swelling in legs   _13 Varicose veins   _14 Non-healing ulcers Pulmonary:   _15 Uses home oxygen   _16 Productive cough   _17 Hemoptysis   _18 Wheeze  _19 COPD   _20 Asthma Neurologic:  _21 Dizziness   _22 Seizures   _23 History of stroke   _24 History of TIA  _25 Aphasia   _26 Vissual changes   _27 Weakness or  numbness in arm   _28 Weakness or numbness in leg Musculoskeletal:   _29 Joint swelling   _30 Joint pain   _31 Low back pain  _32  History of Knee Replacement Hematologic:  _33 Easy bruising  _34 Easy bleeding   _35 Hypercoagulable state   _36 Anemic Gastrointestinal:  _37 Diarrhea   _38 Vomiting  _39 Gastroesophageal reflux/heartburn   _40 Difficulty swallowing. Genitourinary:  _41 Chronic kidney disease   _42 Difficult urination  _43 Anuric   _44 Blood in urine Skin:  _45 Rashes   _46 Ulcers  Psychological:  _47 History of anxiety   _48  History of major depression  _49  Memory Difficulties     Objective:   Physical Exam  BP (!) 154/86 (BP Location: Right Arm, Patient Position: Sitting)   Pulse 68   Resp 18   Ht _50  (1.803 m)   Wt 254 lb (115.2 kg)   BMI 35.43 kg/m   Gen: WD/WN, NAD Head: [place]/AT, No temporalis wasting.  Ear/Nose/Throat: Hearing grossly intact, nares w/o erythema or drainage Eyes: PER, EOMI, sclera nonicteric.  Neck: Supple, no masses.  No JVD.  Pulmonary:  Good air movement, no use of accessory muscles.  Cardiac: RRR Vascular:  Lateral swelling of the lower extremities, 2+ on left and 1+ on right.  Left leg appears erythematous and warm. Vessel Right Left  Radial Palpable Palpable  Posterior Tibial Palpable Palpable   Gastrointestinal: soft, non-distended. No guarding/no peritoneal signs.  Musculoskeletal: M/S 5/5 throughout.  No deformity or atrophy.  Neurologic: Pain and light touch intact in extremities.  Symmetrical.  Speech is fluent. Motor exam as listed above. Psychiatric: Judgment intact, Mood & affect appropriate for pt's clinical situation. Dermatologic: No Venous rashes. No Ulcers Noted.  Erythematous on left lower extremity  Lymph : No Cervical lymphadenopathy, no lichenification or skin changes of chronic lymphedema.      Assessment & Plan:   1. Lymphedema  No surgery or intervention at this point in time.    I have reviewed my discussion with the patient regarding lymphedema and  why it  causes symptoms.  Patient will continue wearing graduated compression stockings class 1 (20-30 mmHg) on a daily basis a prescription was given. The patient is reminded to put the stockings on first thing in the morning and removing them in the evening. The patient is instructed specifically not to sleep in the stockings.   In addition, behavioral modification throughout the day will be continued.  This will include frequent elevation (such as in  a recliner), use of over the counter pain medications as needed and exercise such as walking.  I have reviewed systemic causes for chronic edema such as liver, kidney and cardiac etiologies and there does not appear to be any significant changes in these organ systems over the past year.  The patient is under the impression that these organ systems are all stable and unchanged.    The patient will continue aggressive use of the  lymph pump.  This will continue to improve the edema control and prevent sequela such as ulcers and infections.       2. Mixed hyperlipidemia Continue statin as ordered and reviewed, no changes at this time   3. Chronic obstructive pulmonary disease, unspecified COPD type ([place]) Continue pulmonary medications and aerosols as already ordered, these medications have been reviewed and there are no changes at this time.    4. Cellulitis of left lower extremity Patient has had multiple episodes of cellulitis.  Given he and his wife's description of his left lower extremity for the last 2 days as well as the redness and warmth that I feel today, I feel is prudent to treat for cellulitis to ensure no further progression.  Patient will follow-up in 1 month to evaluate status.   - doxycycline (VIBRAMYCIN) 100 MG capsule; Take 1 capsule (100 mg total) by mouth 2 (two) times daily.  Dispense: 20 capsule; Refill: 0    Current Outpatient Medications on File Prior to Visit  Medication Sig Dispense Refill  . acetaminophen  (TYLENOL) 650 MG CR tablet Take 1,300 mg by mouth every 8 (eight) hours as needed for pain.     Marland Kitchen ALPRAZolam (XANAX) 0.25 MG tablet Take 0.25 mg by mouth 3 (three) times daily as needed for anxiety.  5  . amiodarone (PACERONE) 200 MG tablet Take 200 mg by mouth daily.   3  . amLODipine (NORVASC) 5 MG tablet Take 5 mg by mouth daily.    Marland Kitchen b complex vitamins capsule Take by mouth.    . benazepril (LOTENSIN) 40 MG tablet Take 40 mg by mouth daily.     Marland Kitchen diltiazem (CARDIZEM CD) 120 MG 24 hr capsule Take by mouth.    Arne Cleveland 5 MG TABS tablet Take 5 mg by mouth 2 (two) times daily.     Marland Kitchen escitalopram (LEXAPRO) 10 MG tablet Take by mouth.    . furosemide (LASIX) 40 MG tablet TAKE 40 MG BY MOUTH ONCE DAILY AS NEEDED FOR EDEMA    . metoprolol succinate (TOPROL-XL) 25 MG 24 hr tablet Take by mouth.    . Naphazoline HCl (CLEAR EYES OP) Place 1 drop into both eyes daily as needed (for dry eyes).    Marland Kitchen PRILOSEC OTC 20 MG tablet Take 20 mg by mouth daily as needed (for acid reflux).   11  . sildenafil (REVATIO) 20 MG tablet TAKE 2 TO 5 TABLETS BY MOUTH DAILY AS NEEDED.  4  . vitamin B-12 (CYANOCOBALAMIN) 1000 MCG tablet Take 1,000 mcg by mouth daily.      No current facility-administered medications on file prior to visit.     There are no Patient Instructions on file for this visit. No follow-ups on file.   Kris Hartmann, NP  This note was completed with Sales executive.  Any errors are purely unintentional.

## 2018-10-12 ENCOUNTER — Ambulatory Visit
Admission: RE | Admit: 2018-10-12 | Discharge: 2018-10-12 | Disposition: A | Discharge status: Home / Self Care | Payer: PPO | Source: Ambulatory Visit | Attending: Specialist | Admitting: Specialist

## 2018-10-12 DIAGNOSIS — J84112 Idiopathic pulmonary fibrosis: Secondary | ICD-10-CM | POA: Insufficient documentation

## 2018-10-12 DIAGNOSIS — J849 Interstitial pulmonary disease, unspecified: Secondary | ICD-10-CM | POA: Diagnosis not present

## 2018-10-12 DIAGNOSIS — R918 Other nonspecific abnormal finding of lung field: Secondary | ICD-10-CM | POA: Diagnosis not present

## 2018-10-12 DIAGNOSIS — I700 Atherosclerosis of aorta: Secondary | ICD-10-CM | POA: Diagnosis not present

## 2018-10-17 DIAGNOSIS — D649 Anemia, unspecified: Secondary | ICD-10-CM | POA: Diagnosis not present

## 2018-10-17 DIAGNOSIS — M1A9XX0 Chronic gout, unspecified, without tophus (tophi): Secondary | ICD-10-CM | POA: Diagnosis not present

## 2018-10-17 DIAGNOSIS — D692 Other nonthrombocytopenic purpura: Secondary | ICD-10-CM | POA: Diagnosis not present

## 2018-10-17 DIAGNOSIS — I4819 Other persistent atrial fibrillation: Secondary | ICD-10-CM | POA: Diagnosis not present

## 2018-10-17 DIAGNOSIS — I25810 Atherosclerosis of coronary artery bypass graft(s) without angina pectoris: Secondary | ICD-10-CM | POA: Diagnosis not present

## 2018-10-17 DIAGNOSIS — E039 Hypothyroidism, unspecified: Secondary | ICD-10-CM | POA: Diagnosis not present

## 2018-10-25 DIAGNOSIS — R739 Hyperglycemia, unspecified: Secondary | ICD-10-CM | POA: Diagnosis not present

## 2018-10-25 DIAGNOSIS — R600 Localized edema: Secondary | ICD-10-CM | POA: Diagnosis not present

## 2018-10-25 DIAGNOSIS — I25810 Atherosclerosis of coronary artery bypass graft(s) without angina pectoris: Secondary | ICD-10-CM | POA: Diagnosis not present

## 2018-10-25 DIAGNOSIS — E782 Mixed hyperlipidemia: Secondary | ICD-10-CM | POA: Diagnosis not present

## 2018-10-25 DIAGNOSIS — J8410 Pulmonary fibrosis, unspecified: Secondary | ICD-10-CM | POA: Diagnosis not present

## 2018-10-25 DIAGNOSIS — I10 Essential (primary) hypertension: Secondary | ICD-10-CM | POA: Diagnosis not present

## 2018-10-25 DIAGNOSIS — D692 Other nonthrombocytopenic purpura: Secondary | ICD-10-CM | POA: Diagnosis not present

## 2018-10-25 DIAGNOSIS — G4733 Obstructive sleep apnea (adult) (pediatric): Secondary | ICD-10-CM | POA: Diagnosis not present

## 2018-10-25 DIAGNOSIS — M1A9XX0 Chronic gout, unspecified, without tophus (tophi): Secondary | ICD-10-CM | POA: Diagnosis not present

## 2018-10-25 DIAGNOSIS — I739 Peripheral vascular disease, unspecified: Secondary | ICD-10-CM | POA: Diagnosis not present

## 2018-10-25 DIAGNOSIS — I4819 Other persistent atrial fibrillation: Secondary | ICD-10-CM | POA: Diagnosis not present

## 2018-10-25 DIAGNOSIS — J449 Chronic obstructive pulmonary disease, unspecified: Secondary | ICD-10-CM | POA: Diagnosis not present

## 2018-10-27 DIAGNOSIS — I890 Lymphedema, not elsewhere classified: Secondary | ICD-10-CM | POA: Diagnosis not present

## 2018-11-05 DIAGNOSIS — G4733 Obstructive sleep apnea (adult) (pediatric): Secondary | ICD-10-CM | POA: Diagnosis not present

## 2018-11-07 ENCOUNTER — Encounter (INDEPENDENT_AMBULATORY_CARE_PROVIDER_SITE_OTHER): Payer: Self-pay | Admitting: Vascular Surgery

## 2018-11-07 ENCOUNTER — Ambulatory Visit (INDEPENDENT_AMBULATORY_CARE_PROVIDER_SITE_OTHER): Payer: PPO | Admitting: Vascular Surgery

## 2018-11-07 VITALS — BP 159/76 | HR 50 | Resp 16 | Ht 71.0 in | Wt 253.4 lb

## 2018-11-07 DIAGNOSIS — I10 Essential (primary) hypertension: Secondary | ICD-10-CM

## 2018-11-07 DIAGNOSIS — I890 Lymphedema, not elsewhere classified: Secondary | ICD-10-CM | POA: Diagnosis not present

## 2018-11-07 DIAGNOSIS — L03116 Cellulitis of left lower limb: Secondary | ICD-10-CM | POA: Insufficient documentation

## 2018-11-07 DIAGNOSIS — I25118 Atherosclerotic heart disease of native coronary artery with other forms of angina pectoris: Secondary | ICD-10-CM

## 2018-11-07 DIAGNOSIS — I6523 Occlusion and stenosis of bilateral carotid arteries: Secondary | ICD-10-CM

## 2018-11-07 DIAGNOSIS — L03115 Cellulitis of right lower limb: Secondary | ICD-10-CM | POA: Insufficient documentation

## 2018-11-07 NOTE — Progress Notes (Signed)
MRN : [id number]  [name]. is a 77 y.o. (29-Oct-1941) male who presents with chief complaint of No chief complaint on file. Marland Kitchen  History of Present Illness:   The patient returns today for follow up regarding DVT and infection of the left lower extremity.  He has previously had episodes of cellulitis and DVT in the lower extremity before.  He is completed his course of doxycycline.  He states his leg is much better.  Is less swollen is less painful and the redness is now completely gone.  He notes that the lymphedema pump has arrived and he has not begun using it yet he was wondering if it is okay to start.   The patient denies any rest pain or claudication-like symptoms.  The patient denies any chest pain or shortness of breath.  Patient denies any amaurosis fugax or TIA-like symptoms.  The patient underwent a left lower extremity DVT study which was negative for DVT or superficial venous thrombosis.  No outpatient medications have been marked as taking for the 11/07/18 encounter (Appointment) with Delana Meyer, Dolores Lory, MD.    Past Medical History:  Diagnosis Date  . Allergy   . Arthritis   . Dysrhythmia   . Hypertension   . Peripheral vascular disease ([place])   . Sleep apnea     Past Surgical History:  Procedure Laterality Date  . CARDIAC SURGERY    . CARDIOVERSION N/A 03/03/2018   Procedure: CARDIOVERSION;  Surgeon: [name], MD;  Location: [facility] ORS;  Service: Cardiovascular;  Laterality: N/A;  . CARDIOVERSION N/A 05/24/2018   Procedure: CARDIOVERSION;  Surgeon: [name], MD;  Location: [facility] ORS;  Service: Cardiovascular;  Laterality: N/A;  . EYE SURGERY    . LEG SURGERY Left    stents placed  . TONSILLECTOMY      Social History Social History   Tobacco Use  . Smoking status: Never Smoker  . Smokeless tobacco: Never Used  Substance Use Topics  . Alcohol use: Yes    Comment: rarely  . Drug use: No    Family History Family History    Problem Relation Age of Onset  . Thyroid disease Mother   . Macular degeneration Mother   . Cancer Father     Allergies  Allergen Reactions  . Tramadol Other (See Comments)    confusion  . Hydrochlorothiazide Other (See Comments)    worsens gout  . Iodine Rash  . Shellfish Allergy Rash     REVIEW OF SYSTEMS (Negative unless checked)  Constitutional: _0 Weight loss  _1 Fever  _2 Chills Cardiac: _3 Chest pain   _4 Chest pressure   _5 Palpitations   _6 Shortness of breath when laying flat   _7 Shortness of breath with exertion. Vascular:  _8 Pain in legs with walking   _9 Pain in legs at rest  _10 History of DVT   _11 Phlebitis   _12 Swelling in legs   _13 Varicose veins   _14 Non-healing ulcers Pulmonary:   _15 Uses home oxygen   _16 Productive cough   _17 Hemoptysis   _18 Wheeze  _19 COPD   _20 Asthma Neurologic:  _21 Dizziness   _22 Seizures   _23 History of stroke   _24 History of TIA  _25 Aphasia   _26 Vissual changes   _27 Weakness or numbness in arm   _28 Weakness or numbness in leg Musculoskeletal:   _29 Joint swelling   _30 Joint pain   _31 Low back pain Hematologic:  _32 Easy bruising  _33 Easy bleeding   _34 Hypercoagulable state   _35 Anemic Gastrointestinal:  _36 Diarrhea   _37 Vomiting  _38 Gastroesophageal reflux/heartburn   _39 Difficulty  swallowing. Genitourinary:  _0 Chronic kidney disease   _1 Difficult urination  _2 Frequent urination   _3 Blood in urine Skin:  _4 Rashes   _5 Ulcers  Psychological:  _6 History of anxiety   _7  History of major depression.  Physical Examination  There were no vitals filed for this visit. There is no height or weight on file to calculate BMI. Gen: WD/WN, NAD Head: [place]/AT, No temporalis wasting.  Ear/Nose/Throat: Hearing grossly intact, nares w/o erythema or drainage Eyes: PER, EOMI, sclera nonicteric.  Neck: Supple, no large masses.   Pulmonary:  Good air movement, no audible wheezing bilaterally, no use of accessory muscles.  Cardiac: RRR, no JVD Vascular: Scattered varicosities present  bilaterally.  severe venous stasis changes to the legs bilaterally.  3+ soft pitting edema left > right Vessel Right Left  Radial Palpable Palpable  PT Trace Palpable Trace Palpable  DP Not Palpable Not Palpable  Gastrointestinal: Non-distended. No guarding/no peritoneal signs.  Musculoskeletal: M/S 5/5 throughout.  No deformity or atrophy.  Neurologic: CN 2-12 intact. Symmetrical.  Speech is fluent. Motor exam as listed above. Psychiatric: Judgment intact, Mood & affect appropriate for pt's clinical situation. Dermatologic: venous rashes no ulcers noted.  No changes consistent with cellulitis. Lymph : No lichenification or skin changes of chronic lymphedema.  CBC Lab Results  Component Value Date   WBC 9.5 09/09/2015   HGB 13.1 09/09/2015   HCT 39.5 (L) 09/09/2015   MCV 86.3 09/09/2015   PLT 181 09/09/2015    BMET    Component Value Date/Time   NA 138 01/28/2012 0550   K 4.4 01/28/2012 0550   CL 103 01/28/2012 0550   CO2 26 01/28/2012 0550   GLUCOSE 110 (H) 01/28/2012 0550   BUN 19 (H) 01/28/2012 0550   CREATININE 0.85 01/28/2012 0550   CALCIUM 8.7 01/28/2012 0550   GFRNONAA >60 01/28/2012 0550   GFRAA >60 01/28/2012 0550   CrCl cannot be calculated (Patient's most recent lab result is older than the maximum 21 days allowed.).  COAG Lab Results  Component Value Date   INR 1.45 09/09/2015   INR 1.9 01/29/2012   INR 1.7 01/28/2012    Radiology Ct Chest Wo Contrast  Result Date: 10/12/2018 CLINICAL DATA:  Followup pulmonary nodules. History of interstitial lung disease. EXAM: CT CHEST WITHOUT CONTRAST TECHNIQUE: Multidetector CT imaging of the chest was performed following the standard protocol without IV contrast. COMPARISON:  11/09/2017 FINDINGS: Cardiovascular: The heart size is mildly enlarged. There is aortic atherosclerosis. Previous median sternotomy and CABG procedure. No pericardial effusion. Mediastinum/Nodes: Normal appearance of the thyroid gland. No  axillary or supraclavicular lymph nodes. Prominent mediastinal lymph nodes are identified including a 1.2 cm right paratracheal lymph node. Subcarinal lymph node is prominent measuring 1.3 cm. The hilar structures are suboptimally evaluated due to lack of IV contrast material. Lungs/Pleura: No pleural effusion identified. Peripheral interstitial reticulation with traction bronchiectasis and basilar predominant honeycombing identified as before. Findings compatible with usual interstitial pneumonia. Similar to previous exam. Previous nodule within the lingula is unchanged measuring 7 mm, image 80/4. Calcified granuloma in the right lower lobe is again noted, image 102/4. Posterior right lower lobe lung nodule is unchanged measuring 6 mm, image 83/4. Within the left upper lobe there is a small stable nodule measuring 3 mm, image 30/4. No new or enlarging pulmonary parenchymal nodules or masses identified. Nodular perifissural thickening the along the major fissure is stable, image 74/4 and image 78/4. Upper Abdomen: No acute abnormality. Musculoskeletal: The bones appear diffusely osteopenic. There  are chronic healed right anterolateral rib deformities identified. Round and well-circumscribed subcutaneous soft tissue nodule measures 3 cm and is favored to represent a sebaceous cyst. Unchanged. IMPRESSION: 1. 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 2. Unchanged calcified and noncalcified pulmonary nodules, favor benign etiology. No further follow-up of these nodules is indicated at this time. 3.  Aortic Atherosclerosis (ICD10-I70.0). Electronically Signed   By: Kerby Moors M.D.   On: 10/12/2018 15:39      Assessment/Plan 1. Cellulitis of leg, left Appears resolved.  Doxycycline has been completed.  I would begin using the lymph pump as his duplex ultrasound is negative.  I have encouraged him to wear compression stockings on a routine basis.  2.  Lymphedema No surgery or intervention at this point in time.    I have had a long discussion with the patient regarding venous insufficiency and why it  causes symptoms. I have discussed with the patient the chronic skin changes that accompany venous insufficiency and the long term sequela such as infection and ulceration.  Patient will begin wearing graduated compression stockings class 1 (20-30 mmHg) or compression wraps on a daily basis a prescription was given. The patient will put the stockings on first thing in the morning and removing them in the evening. The patient is instructed specifically not to sleep in the stockings.    In addition, behavioral modification including several periods of elevation of the lower extremities during the day will be continued. I have demonstrated that proper elevation is a position with the ankles at heart level.  The patient is instructed to begin routine exercise, especially walking on a daily basis   3. Bilateral carotid artery stenosis Patient will follow-up in 6 months at which time he will undergo his duplex ultrasound for surveillance of his carotid stenosis. 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  Continue statin as ordered and reviewed, no changes at this time  Nitrates PRN for chest pain   5. Benign essential hypertension Continue antihypertensive medications as already ordered, these medications have been reviewed and there are no changes at this time.

## 2018-11-23 DIAGNOSIS — R600 Localized edema: Secondary | ICD-10-CM | POA: Diagnosis not present

## 2018-11-23 DIAGNOSIS — I4819 Other persistent atrial fibrillation: Secondary | ICD-10-CM | POA: Diagnosis not present

## 2018-11-23 DIAGNOSIS — E782 Mixed hyperlipidemia: Secondary | ICD-10-CM | POA: Diagnosis not present

## 2018-11-23 DIAGNOSIS — I10 Essential (primary) hypertension: Secondary | ICD-10-CM | POA: Diagnosis not present

## 2018-11-23 DIAGNOSIS — G4733 Obstructive sleep apnea (adult) (pediatric): Secondary | ICD-10-CM | POA: Diagnosis not present

## 2018-12-05 DIAGNOSIS — G4733 Obstructive sleep apnea (adult) (pediatric): Secondary | ICD-10-CM | POA: Diagnosis not present

## 2018-12-07 DIAGNOSIS — G4733 Obstructive sleep apnea (adult) (pediatric): Secondary | ICD-10-CM | POA: Diagnosis not present

## 2019-01-05 DIAGNOSIS — G4733 Obstructive sleep apnea (adult) (pediatric): Secondary | ICD-10-CM | POA: Diagnosis not present

## 2019-01-06 DIAGNOSIS — G4733 Obstructive sleep apnea (adult) (pediatric): Secondary | ICD-10-CM | POA: Diagnosis not present

## 2019-01-16 DIAGNOSIS — E782 Mixed hyperlipidemia: Secondary | ICD-10-CM | POA: Diagnosis not present

## 2019-01-16 DIAGNOSIS — I25810 Atherosclerosis of coronary artery bypass graft(s) without angina pectoris: Secondary | ICD-10-CM | POA: Diagnosis not present

## 2019-01-16 DIAGNOSIS — R739 Hyperglycemia, unspecified: Secondary | ICD-10-CM | POA: Diagnosis not present

## 2019-01-16 DIAGNOSIS — M1A9XX0 Chronic gout, unspecified, without tophus (tophi): Secondary | ICD-10-CM | POA: Diagnosis not present

## 2019-01-16 DIAGNOSIS — I4819 Other persistent atrial fibrillation: Secondary | ICD-10-CM | POA: Diagnosis not present

## 2019-01-16 DIAGNOSIS — I10 Essential (primary) hypertension: Secondary | ICD-10-CM | POA: Diagnosis not present

## 2019-01-26 DIAGNOSIS — D692 Other nonthrombocytopenic purpura: Secondary | ICD-10-CM | POA: Diagnosis not present

## 2019-01-26 DIAGNOSIS — R739 Hyperglycemia, unspecified: Secondary | ICD-10-CM | POA: Diagnosis not present

## 2019-01-26 DIAGNOSIS — J449 Chronic obstructive pulmonary disease, unspecified: Secondary | ICD-10-CM | POA: Diagnosis not present

## 2019-01-26 DIAGNOSIS — I4819 Other persistent atrial fibrillation: Secondary | ICD-10-CM | POA: Diagnosis not present

## 2019-01-26 DIAGNOSIS — E782 Mixed hyperlipidemia: Secondary | ICD-10-CM | POA: Diagnosis not present

## 2019-01-26 DIAGNOSIS — I739 Peripheral vascular disease, unspecified: Secondary | ICD-10-CM | POA: Diagnosis not present

## 2019-01-26 DIAGNOSIS — I25810 Atherosclerosis of coronary artery bypass graft(s) without angina pectoris: Secondary | ICD-10-CM | POA: Diagnosis not present

## 2019-01-26 DIAGNOSIS — J8410 Pulmonary fibrosis, unspecified: Secondary | ICD-10-CM | POA: Diagnosis not present

## 2019-01-26 DIAGNOSIS — I10 Essential (primary) hypertension: Secondary | ICD-10-CM | POA: Diagnosis not present

## 2019-01-26 DIAGNOSIS — M1A9XX0 Chronic gout, unspecified, without tophus (tophi): Secondary | ICD-10-CM | POA: Diagnosis not present

## 2019-01-26 DIAGNOSIS — M48061 Spinal stenosis, lumbar region without neurogenic claudication: Secondary | ICD-10-CM | POA: Diagnosis not present

## 2019-02-03 DIAGNOSIS — J849 Interstitial pulmonary disease, unspecified: Secondary | ICD-10-CM | POA: Diagnosis not present

## 2019-02-03 DIAGNOSIS — R0602 Shortness of breath: Secondary | ICD-10-CM | POA: Diagnosis not present

## 2019-02-04 DIAGNOSIS — G4733 Obstructive sleep apnea (adult) (pediatric): Secondary | ICD-10-CM | POA: Diagnosis not present

## 2019-02-07 DIAGNOSIS — H34812 Central retinal vein occlusion, left eye, with macular edema: Secondary | ICD-10-CM | POA: Diagnosis not present

## 2019-02-16 ENCOUNTER — Ambulatory Visit (INDEPENDENT_AMBULATORY_CARE_PROVIDER_SITE_OTHER): Payer: PPO | Admitting: Vascular Surgery

## 2019-02-16 ENCOUNTER — Encounter (INDEPENDENT_AMBULATORY_CARE_PROVIDER_SITE_OTHER): Payer: Self-pay | Admitting: Vascular Surgery

## 2019-02-16 ENCOUNTER — Other Ambulatory Visit: Payer: Self-pay

## 2019-02-16 VITALS — BP 154/89 | HR 62 | Resp 10 | Ht 71.0 in | Wt 257.0 lb

## 2019-02-16 DIAGNOSIS — Z7902 Long term (current) use of antithrombotics/antiplatelets: Secondary | ICD-10-CM

## 2019-02-16 DIAGNOSIS — I25118 Atherosclerotic heart disease of native coronary artery with other forms of angina pectoris: Secondary | ICD-10-CM | POA: Diagnosis not present

## 2019-02-16 DIAGNOSIS — Z87891 Personal history of nicotine dependence: Secondary | ICD-10-CM | POA: Diagnosis not present

## 2019-02-16 DIAGNOSIS — J449 Chronic obstructive pulmonary disease, unspecified: Secondary | ICD-10-CM

## 2019-02-16 DIAGNOSIS — Z7982 Long term (current) use of aspirin: Secondary | ICD-10-CM

## 2019-02-16 DIAGNOSIS — I739 Peripheral vascular disease, unspecified: Secondary | ICD-10-CM | POA: Diagnosis not present

## 2019-02-16 DIAGNOSIS — I10 Essential (primary) hypertension: Secondary | ICD-10-CM | POA: Diagnosis not present

## 2019-02-16 DIAGNOSIS — I6523 Occlusion and stenosis of bilateral carotid arteries: Secondary | ICD-10-CM | POA: Diagnosis not present

## 2019-02-16 DIAGNOSIS — I890 Lymphedema, not elsewhere classified: Secondary | ICD-10-CM

## 2019-02-16 DIAGNOSIS — Z79899 Other long term (current) drug therapy: Secondary | ICD-10-CM | POA: Diagnosis not present

## 2019-02-16 NOTE — Progress Notes (Signed)
MRN : [id number]  [name]. is a 78 y.o. (Feb 25, 1941) male who presents with chief complaint of  Chief Complaint  Patient presents with  . Follow-up  .  History of Present Illness:   The patient returns to the office for followup evaluation regarding leg swelling.  The swelling has persisted but with the lymph pump is much, much better controlled. The pain associated with swelling is essentially eliminated. There have not been any interval development of a ulcerations or wounds.  The patient denies problems with the pump, noting it is working well and the leggings are in good condition.  Since the previous visit the patient has been wearing graduated compression stockings and using the lymph pump on a routine basis and  has noted significant improvement in the lymphedema.   Patient stated the lymph pump has been a very positive factor in her care.   The patient is also seen for follow up evaluation of carotid stenosis. The carotid stenosis followed by ultrasound.   The patient denies amaurosis fugax. There is no recent history of TIA symptoms or focal motor deficits. There is no prior documented CVA.  The patient is taking enteric-coated aspirin 81 mg daily.  There is no history of migraine headaches. There is no history of seizures.  The patient has a history of coronary artery disease, no recent episodes of angina or shortness of breath.  There is a history of hyperlipidemia which is being treated with a statin.    Carotid Duplex showed <50% he is due for his next study in about 6 months   Current Meds  Medication Sig  . acetaminophen (TYLENOL) 650 MG CR tablet Take 1,300 mg by mouth every 8 (eight) hours as needed for pain.   Marland Kitchen ALPRAZolam (XANAX) 0.25 MG tablet Take 0.25 mg by mouth 3 (three) times daily as needed for anxiety.  Marland Kitchen amiodarone (PACERONE) 200 MG tablet Take 200 mg by mouth daily.   Marland Kitchen amLODipine (NORVASC) 5 MG tablet Take 5 mg by mouth daily.  Marland Kitchen b  complex vitamins capsule Take by mouth.  . benazepril (LOTENSIN) 40 MG tablet Take 40 mg by mouth daily.   Marland Kitchen diltiazem (CARDIZEM CD) 120 MG 24 hr capsule Take by mouth.  Arne Cleveland 5 MG TABS tablet Take 5 mg by mouth 2 (two) times daily.   . furosemide (LASIX) 40 MG tablet TAKE 40 MG BY MOUTH ONCE DAILY AS NEEDED FOR EDEMA  . metoprolol succinate (TOPROL-XL) 25 MG 24 hr tablet Take by mouth.  . Naphazoline HCl (CLEAR EYES OP) Place 1 drop into both eyes daily as needed (for dry eyes).    Past Medical History:  Diagnosis Date  . Allergy   . Arthritis   . Dysrhythmia   . Hypertension   . Peripheral vascular disease ([place])   . Sleep apnea     Past Surgical History:  Procedure Laterality Date  . CARDIAC SURGERY    . CARDIOVERSION N/A 03/03/2018   Procedure: CARDIOVERSION;  Surgeon: [name], MD;  Location: [facility] ORS;  Service: Cardiovascular;  Laterality: N/A;  . CARDIOVERSION N/A 05/24/2018   Procedure: CARDIOVERSION;  Surgeon: [name], MD;  Location: [facility] ORS;  Service: Cardiovascular;  Laterality: N/A;  . EYE SURGERY    . LEG SURGERY Left    stents placed  . TONSILLECTOMY      Social History Social History   Tobacco Use  . Smoking status: Former Smoker    Last  attempt to quit: 02/16/1979    Years since quitting: 40.0  . Smokeless tobacco: Never Used  Substance Use Topics  . Alcohol use: Yes    Comment: rarely  . Drug use: No    Family History Family History  Problem Relation Age of Onset  . Thyroid disease Mother   . Macular degeneration Mother   . Cancer Father     Allergies  Allergen Reactions  . Tramadol Other (See Comments)    confusion  . Hydrochlorothiazide Other (See Comments)    worsens gout  . Iodine Rash  . Shellfish Allergy Rash     REVIEW OF SYSTEMS (Negative unless checked)  Constitutional: _0 Weight loss  _1 Fever  _2 Chills Cardiac: _3 Chest pain   _4 Chest pressure   _5 Palpitations   _6 Shortness of breath when laying flat    _7 Shortness of breath with exertion. Vascular:  _8 Pain in legs with walking   _9 Pain in legs at rest  _10 History of DVT   _11 Phlebitis   _12 Swelling in legs   _13 Varicose veins   _14 Non-healing ulcers Pulmonary:   _15 Uses home oxygen   _16 Productive cough   _17 Hemoptysis   _18 Wheeze  _19 COPD   _20 Asthma Neurologic:  _21 Dizziness   _22 Seizures   _23 History of stroke   _24 History of TIA  _25 Aphasia   _26 Vissual changes   _27 Weakness or numbness in arm   _28 Weakness or numbness in leg Musculoskeletal:   _29 Joint swelling   _30 Joint pain   _31 Low back pain Hematologic:  _32 Easy bruising  _33 Easy bleeding   _34 Hypercoagulable state   _35 Anemic Gastrointestinal:  _36 Diarrhea   _37 Vomiting  _38 Gastroesophageal reflux/heartburn   _39 Difficulty swallowing. Genitourinary:  _40 Chronic kidney disease   _41 Difficult urination  _42 Frequent urination   _43 Blood in urine Skin:  _44 Venous Rashes   _45 Ulcers  Psychological:  _46 History of anxiety   _47  History of major depression.  Physical Examination  Vitals:   02/16/19 1547  BP: (!) 154/89  Pulse: 62  Resp: 10  Weight: 257 lb (116.6 kg)  Height: _48  (1.803 m)   Body mass index is 35.84 kg/m. Gen: WD/WN, NAD Head: [place]/AT, No temporalis wasting.  Ear/Nose/Throat: Hearing grossly intact, nares w/o erythema or drainage Eyes: PER, EOMI, sclera nonicteric.  Neck: Supple, no large masses.   Pulmonary:  Good air movement, no audible wheezing bilaterally, no use of accessory muscles.  Cardiac: RRR, no JVD Vascular: scattered varicosities present bilaterally.  moderate venous stasis changes to the legs bilaterally.  2-3+ soft pitting edema Vessel Right Left  Radial Palpable Palpable  Brachial Palpable Palpable  Carotid Palpable Palpable  PT Trace Palpable Trace Palpable  DP Not Palpable Not Palpable  Gastrointestinal: Non-distended. No guarding/no peritoneal signs.  Musculoskeletal: M/S 5/5 throughout.  No deformity or atrophy.  Neurologic: CN 2-12 intact. Symmetrical.  Speech is  fluent. Motor exam as listed above. Psychiatric: Judgment intact, Mood & affect appropriate for pt's clinical situation. Dermatologic: Venous rashes no ulcers noted.  No changes consistent with cellulitis. Lymph : No lichenification or skin changes of chronic lymphedema.  CBC Lab Results  Component Value Date   WBC 9.5 09/09/2015   HGB 13.1 09/09/2015   HCT 39.5 (L) 09/09/2015   MCV 86.3 09/09/2015   PLT 181 09/09/2015    BMET    Component Value Date/Time   NA 138 01/28/2012 0550   K 4.4 01/28/2012 0550   CL 103 01/28/2012 0550   CO2 26 01/28/2012 0550   GLUCOSE 110 (H) 01/28/2012 0550   BUN 19 (H) 01/28/2012 0550  CREATININE 0.85 01/28/2012 0550   CALCIUM 8.7 01/28/2012 0550   GFRNONAA >60 01/28/2012 0550   GFRAA >60 01/28/2012 0550   CrCl cannot be calculated (Patient's most recent lab result is older than the maximum 21 days allowed.).  COAG Lab Results  Component Value Date   INR 1.45 09/09/2015   INR 1.9 01/29/2012   INR 1.7 01/28/2012    Radiology No results found.   Assessment/Plan 1. Lymphedema  No surgery or intervention at this point in time.    I have reviewed my discussion with the patient regarding lymphedema and why it  causes symptoms.  Patient will continue wearing graduated compression stockings class 1 (20-30 mmHg) on a daily basis a prescription was given. The patient is reminded to put the stockings on first thing in the morning and removing them in the evening. The patient is instructed specifically not to sleep in the stockings.   In addition, behavioral modification throughout the day will be continued.  This will include frequent elevation (such as in a recliner), use of over the counter pain medications as needed and exercise such as walking.  I have reviewed systemic causes for chronic edema such as liver, kidney and cardiac etiologies and there does not appear to be any significant changes in these organ systems over the past year.  The  patient is under the impression that these organ systems are all stable and unchanged.    The patient will continue aggressive use of the  lymph pump.  This will continue to improve the edema control and prevent sequela such as ulcers and infections.   The patient will follow-up with me on an annual basis.    2. Bilateral carotid artery stenosis Recommend:  Given the patient's asymptomatic subcritical stenosis no further invasive testing or surgery at this time.  Duplex ultrasound shows <50% stenosis bilaterally.  Continue antiplatelet therapy as prescribed Continue management of CAD, HTN and Hyperlipidemia Healthy heart diet,  encouraged exercise at least 4 times per week Follow up in 6 months (which will be his annual evaluation) with duplex ultrasound and physical exam   3. Peripheral vascular disease ([place])  Recommend:  The patient has evidence of atherosclerosis of the lower extremities with claudication.  The patient does not voice lifestyle limiting changes at this point in time.  Noninvasive studies do not suggest clinically significant change.  No invasive studies, angiography or surgery at this time The patient should continue walking and begin a more formal exercise program.  The patient should continue antiplatelet therapy and aggressive treatment of the lipid abnormalities  No changes in the patient's medications at this time  The patient should continue wearing graduated compression socks 10-15 mmHg strength to control the mild edema.   4. Chronic obstructive pulmonary disease, unspecified COPD type ([place]) Continue pulmonary medications and aerosols as already ordered, these medications have been reviewed and there are no changes at this time.  5. Benign essential hypertension Continue antihypertensive medications as already ordered, these medications have been reviewed and there are no changes at this time.  6. Coronary artery disease of native artery of native  heart with stable angina pectoris (H[facility]) Continue cardiac and antihypertensive medications as already ordered and reviewed, no changes at this time.  Continue statin as ordered and reviewed, no changes at this time  Nitrates PRN for chest pain     Hortencia Pilar, MD  02/16/2019 4:19 PM

## 2019-02-17 ENCOUNTER — Encounter (INDEPENDENT_AMBULATORY_CARE_PROVIDER_SITE_OTHER): Payer: Self-pay | Admitting: Vascular Surgery

## 2019-03-06 DIAGNOSIS — G4733 Obstructive sleep apnea (adult) (pediatric): Secondary | ICD-10-CM | POA: Diagnosis not present

## 2019-04-06 DIAGNOSIS — G4733 Obstructive sleep apnea (adult) (pediatric): Secondary | ICD-10-CM | POA: Diagnosis not present

## 2019-04-12 DIAGNOSIS — G4733 Obstructive sleep apnea (adult) (pediatric): Secondary | ICD-10-CM | POA: Diagnosis not present

## 2019-04-12 DIAGNOSIS — R0600 Dyspnea, unspecified: Secondary | ICD-10-CM | POA: Diagnosis not present

## 2019-04-12 DIAGNOSIS — J439 Emphysema, unspecified: Secondary | ICD-10-CM | POA: Diagnosis not present

## 2019-04-12 DIAGNOSIS — J84112 Idiopathic pulmonary fibrosis: Secondary | ICD-10-CM | POA: Diagnosis not present

## 2019-05-06 DIAGNOSIS — G4733 Obstructive sleep apnea (adult) (pediatric): Secondary | ICD-10-CM | POA: Diagnosis not present

## 2019-05-09 DIAGNOSIS — G4733 Obstructive sleep apnea (adult) (pediatric): Secondary | ICD-10-CM | POA: Diagnosis not present

## 2019-05-09 DIAGNOSIS — I10 Essential (primary) hypertension: Secondary | ICD-10-CM | POA: Diagnosis not present

## 2019-05-09 DIAGNOSIS — E782 Mixed hyperlipidemia: Secondary | ICD-10-CM | POA: Diagnosis not present

## 2019-05-09 DIAGNOSIS — I25810 Atherosclerosis of coronary artery bypass graft(s) without angina pectoris: Secondary | ICD-10-CM | POA: Diagnosis not present

## 2019-05-09 DIAGNOSIS — I340 Nonrheumatic mitral (valve) insufficiency: Secondary | ICD-10-CM | POA: Diagnosis not present

## 2019-05-09 DIAGNOSIS — I4819 Other persistent atrial fibrillation: Secondary | ICD-10-CM | POA: Diagnosis not present

## 2019-05-15 ENCOUNTER — Ambulatory Visit (INDEPENDENT_AMBULATORY_CARE_PROVIDER_SITE_OTHER): Payer: PPO | Admitting: Vascular Surgery

## 2019-05-15 ENCOUNTER — Encounter (INDEPENDENT_AMBULATORY_CARE_PROVIDER_SITE_OTHER): Payer: PPO

## 2019-06-19 DIAGNOSIS — R739 Hyperglycemia, unspecified: Secondary | ICD-10-CM | POA: Diagnosis not present

## 2019-06-19 DIAGNOSIS — D692 Other nonthrombocytopenic purpura: Secondary | ICD-10-CM | POA: Diagnosis not present

## 2019-06-19 DIAGNOSIS — M1A9XX0 Chronic gout, unspecified, without tophus (tophi): Secondary | ICD-10-CM | POA: Diagnosis not present

## 2019-06-19 DIAGNOSIS — I739 Peripheral vascular disease, unspecified: Secondary | ICD-10-CM | POA: Diagnosis not present

## 2019-06-19 DIAGNOSIS — I4819 Other persistent atrial fibrillation: Secondary | ICD-10-CM | POA: Diagnosis not present

## 2019-06-19 DIAGNOSIS — I25810 Atherosclerosis of coronary artery bypass graft(s) without angina pectoris: Secondary | ICD-10-CM | POA: Diagnosis not present

## 2019-06-19 DIAGNOSIS — I10 Essential (primary) hypertension: Secondary | ICD-10-CM | POA: Diagnosis not present

## 2019-06-19 DIAGNOSIS — E782 Mixed hyperlipidemia: Secondary | ICD-10-CM | POA: Diagnosis not present

## 2019-06-26 DIAGNOSIS — I10 Essential (primary) hypertension: Secondary | ICD-10-CM | POA: Diagnosis not present

## 2019-06-26 DIAGNOSIS — I4819 Other persistent atrial fibrillation: Secondary | ICD-10-CM | POA: Diagnosis not present

## 2019-06-26 DIAGNOSIS — J449 Chronic obstructive pulmonary disease, unspecified: Secondary | ICD-10-CM | POA: Diagnosis not present

## 2019-06-26 DIAGNOSIS — J8410 Pulmonary fibrosis, unspecified: Secondary | ICD-10-CM | POA: Diagnosis not present

## 2019-06-26 DIAGNOSIS — E782 Mixed hyperlipidemia: Secondary | ICD-10-CM | POA: Diagnosis not present

## 2019-06-26 DIAGNOSIS — D692 Other nonthrombocytopenic purpura: Secondary | ICD-10-CM | POA: Diagnosis not present

## 2019-06-26 DIAGNOSIS — G4733 Obstructive sleep apnea (adult) (pediatric): Secondary | ICD-10-CM | POA: Diagnosis not present

## 2019-06-26 DIAGNOSIS — R739 Hyperglycemia, unspecified: Secondary | ICD-10-CM | POA: Diagnosis not present

## 2019-06-26 DIAGNOSIS — M48061 Spinal stenosis, lumbar region without neurogenic claudication: Secondary | ICD-10-CM | POA: Diagnosis not present

## 2019-06-26 DIAGNOSIS — I739 Peripheral vascular disease, unspecified: Secondary | ICD-10-CM | POA: Diagnosis not present

## 2019-06-26 DIAGNOSIS — I25810 Atherosclerosis of coronary artery bypass graft(s) without angina pectoris: Secondary | ICD-10-CM | POA: Diagnosis not present

## 2019-07-10 DIAGNOSIS — J84112 Idiopathic pulmonary fibrosis: Secondary | ICD-10-CM | POA: Diagnosis not present

## 2019-07-10 DIAGNOSIS — J8410 Pulmonary fibrosis, unspecified: Secondary | ICD-10-CM | POA: Diagnosis not present

## 2019-07-10 DIAGNOSIS — R0600 Dyspnea, unspecified: Secondary | ICD-10-CM | POA: Diagnosis not present

## 2019-08-10 ENCOUNTER — Encounter (INDEPENDENT_AMBULATORY_CARE_PROVIDER_SITE_OTHER): Payer: Self-pay | Admitting: Vascular Surgery

## 2019-08-10 ENCOUNTER — Ambulatory Visit (INDEPENDENT_AMBULATORY_CARE_PROVIDER_SITE_OTHER): Payer: PPO | Admitting: Vascular Surgery

## 2019-08-10 ENCOUNTER — Other Ambulatory Visit (INDEPENDENT_AMBULATORY_CARE_PROVIDER_SITE_OTHER): Payer: Self-pay | Admitting: Vascular Surgery

## 2019-08-10 ENCOUNTER — Ambulatory Visit (INDEPENDENT_AMBULATORY_CARE_PROVIDER_SITE_OTHER): Payer: PPO

## 2019-08-10 ENCOUNTER — Encounter (INDEPENDENT_AMBULATORY_CARE_PROVIDER_SITE_OTHER): Payer: Self-pay

## 2019-08-10 ENCOUNTER — Other Ambulatory Visit: Payer: Self-pay

## 2019-08-10 VITALS — BP 158/80 | HR 97 | Resp 12 | Ht 71.0 in | Wt 242.0 lb

## 2019-08-10 DIAGNOSIS — I6523 Occlusion and stenosis of bilateral carotid arteries: Secondary | ICD-10-CM

## 2019-08-10 DIAGNOSIS — I739 Peripheral vascular disease, unspecified: Secondary | ICD-10-CM

## 2019-08-10 DIAGNOSIS — J449 Chronic obstructive pulmonary disease, unspecified: Secondary | ICD-10-CM

## 2019-08-10 DIAGNOSIS — I4819 Other persistent atrial fibrillation: Secondary | ICD-10-CM

## 2019-08-10 DIAGNOSIS — I724 Aneurysm of artery of lower extremity: Secondary | ICD-10-CM | POA: Diagnosis not present

## 2019-08-10 DIAGNOSIS — I25118 Atherosclerotic heart disease of native coronary artery with other forms of angina pectoris: Secondary | ICD-10-CM | POA: Diagnosis not present

## 2019-08-10 NOTE — Progress Notes (Signed)
MRN : [id number]  Collin Sigg. is a 78 y.o. ([birth year]) male who presents with chief complaint of  Chief Complaint  Patient presents with   Follow-up  .  History of Present Illness:   The patient returns to the office for followup evaluation regarding leg swelling.  The swelling has persisted but with the lymph pump is much, much better controlled. The pain associated with swelling is essentially eliminated. There have not been any interval development of a ulcerations or wounds.  The patient denies problems with the pump, noting it is working well and the leggings are in good condition.  Since the previous visit the patient has been wearing graduated compression stockings and using the lymph pump on a routine basis and  has noted significant improvement in the lymphedema.   Patient stated the lymph pump has been a very positive factor in her care.   The patient is also seen for follow up evaluation of carotid stenosis. The carotid stenosis followed by ultrasound.   The patient denies amaurosis fugax. There is no recent history of TIA symptoms or focal motor deficits. There is no prior documented CVA.  The patient is taking enteric-coated aspirin 81 mg daily.  There is no history of migraine headaches. There is no history of seizures.  The patient has a history of coronary artery disease, no recent episodes of angina or shortness of breath.  There is a history of hyperlipidemia which is being treated with a statin.   Carotid Duplex showed <50% he is due for his next study in about 12 months  Lower extremity duplex shows Rt popliteal 1.8 cm and Lt popliteal 1.12  Triphasic signals bilaterally   Current Meds  Medication Sig   acetaminophen (TYLENOL) 650 MG CR tablet Take 1,300 mg by mouth every 8 (eight) hours as needed for pain.    ALPRAZolam (XANAX) 0.25 MG tablet Take 0.25 mg by mouth 3 (three) times daily as needed for anxiety.   amiodarone  (PACERONE) 200 MG tablet Take 200 mg by mouth daily.    b complex vitamins capsule Take by mouth.   benazepril (LOTENSIN) 40 MG tablet Take 40 mg by mouth daily.    diltiazem (CARDIZEM CD) 120 MG 24 hr capsule Take by mouth.   ELIQUIS 5 MG TABS tablet Take 5 mg by mouth 2 (two) times daily.    etodolac (LODINE) 500 MG tablet TAKE 1 TABLET BY MOUTH TWICE DAILY AS NEEDED FOR GOUT PAIN   furosemide (LASIX) 40 MG tablet TAKE 40 MG BY MOUTH ONCE DAILY AS NEEDED FOR EDEMA   metoprolol succinate (TOPROL-XL) 25 MG 24 hr tablet Take by mouth.   Naphazoline HCl (CLEAR EYES OP) Place 1 drop into both eyes daily as needed (for dry eyes).    Past Medical History:  Diagnosis Date   Allergy    Arthritis    Dysrhythmia    Hypertension    Peripheral vascular disease ([place])    Sleep apnea     Past Surgical History:  Procedure Laterality Date   CARDIAC SURGERY     CARDIOVERSION N/A 03/03/2018   Procedure: CARDIOVERSION;  Surgeon: [name], MD;  Location: [facility] ORS;  Service: Cardiovascular;  Laterality: N/A;   CARDIOVERSION N/A 05/24/2018   Procedure: CARDIOVERSION;  Surgeon: [name], MD;  Location: [facility] ORS;  Service: Cardiovascular;  Laterality: N/A;   EYE SURGERY     LEG SURGERY Left    stents placed   TONSILLECTOMY  Social History Social History   Tobacco Use   Smoking status: Former Smoker    Quit date: 02/16/1979    Years since quitting: 40.5   Smokeless tobacco: Never Used  Substance Use Topics   Alcohol use: Yes    Comment: rarely   Drug use: No    Family History Family History  Problem Relation Age of Onset   Thyroid disease Mother    Macular degeneration Mother    Cancer Father     Allergies  Allergen Reactions   Tramadol Other (See Comments)    confusion   Hydrochlorothiazide Other (See Comments)    worsens gout   Iodine Rash   Shellfish Allergy Rash     REVIEW OF SYSTEMS (Negative unless  checked)  Constitutional: _0 Weight loss  _1 Fever  _2 Chills Cardiac: _3 Chest pain   _4 Chest pressure   _5 Palpitations   _6 Shortness of breath when laying flat   _7 Shortness of breath with exertion. Vascular:  _8 Pain in legs with walking   _9 Pain in legs at rest  _10 History of DVT   _11 Phlebitis   _12 Swelling in legs   _13 Varicose veins   _14 Non-healing ulcers Pulmonary:   _15 Uses home oxygen   _16 Productive cough   _17 Hemoptysis   _18 Wheeze  _19 COPD   _20 Asthma Neurologic:  _21 Dizziness   _22 Seizures   _23 History of stroke   _24 History of TIA  _25 Aphasia   _26 Vissual changes   _27 Weakness or numbness in arm   _28 Weakness or numbness in leg Musculoskeletal:   _29 Joint swelling   _30 Joint pain   _31 Low back pain Hematologic:  _32 Easy bruising  _33 Easy bleeding   _34 Hypercoagulable state   _35 Anemic Gastrointestinal:  _36 Diarrhea   _37 Vomiting  _38 Gastroesophageal reflux/heartburn   _39 Difficulty swallowing. Genitourinary:  _40 Chronic kidney disease   _41 Difficult urination  _42 Frequent urination   _43 Blood in urine Skin:  _44 Rashes   _45 Ulcers  Psychological:  _46 History of anxiety   _47  History of major depression.  Physical Examination  Vitals:   08/10/19 1352  BP: (!) 158/80  Pulse: 97  Resp: 12  Weight: 242 lb (109.8 kg)  Height: _48  (1.803 m)   Body mass index is 33.75 kg/m. Gen: WD/WN, NAD Head: /AT, No temporalis wasting.  Ear/Nose/Throat: Hearing grossly intact, nares w/o erythema or drainage Eyes: PER, EOMI, sclera nonicteric.  Neck: Supple, no large masses.   Pulmonary:  Good air movement, no audible wheezing bilaterally, no use of accessory muscles.  Cardiac: RRR, no JVD Vascular: scattered varicosities present bilaterally.  Mild venous stasis changes to the legs bilaterally.  3+ soft pitting edema Vessel Right Left  Radial Palpable Palpable  Popliteal Broad Palpable Broad Palpable  PT Palpable Palpable  DP Palpable Palpable  Gastrointestinal: Non-distended. No guarding/no peritoneal signs.   Musculoskeletal: M/S 5/5 throughout.  No deformity or atrophy.  Neurologic: CN 2-12 intact. Symmetrical.  Speech is fluent. Motor exam as listed above. Psychiatric: Judgment intact, Mood & affect appropriate for pt's clinical situation. Dermatologic: No rashes or ulcers noted.  No changes consistent with cellulitis. Lymph : No lichenification or skin changes of chronic lymphedema.  CBC Lab Results  Component Value Date   WBC 9.5 09/09/2015   HGB 13.1 09/09/2015   HCT 39.5 (L) 09/09/2015   MCV 86.3 09/09/2015   PLT 181 09/09/2015    BMET    Component Value Date/Time   NA 138 01/28/2012 0550   K 4.4 01/28/2012 0550   CL 103 01/28/2012 0550   CO2 26 01/28/2012 0550   GLUCOSE 110 (H) 01/28/2012  0550   BUN 19 (H) 01/28/2012 0550   CREATININE 0.85 01/28/2012 0550   CALCIUM 8.7 01/28/2012 0550   GFRNONAA >60 01/28/2012 0550   GFRAA >60 01/28/2012 0550   CrCl cannot be calculated (Patient's most recent lab result is older than the maximum 21 days allowed.).  COAG Lab Results  Component Value Date   INR 1.45 09/09/2015   INR 1.9 01/29/2012   INR 1.7 01/28/2012    Radiology Vas US Carotid  Result Date: 08/10/2019 Carotid Arterial Duplex Study Comparison Study:  08/11/2017 Performing Technologist: Almira Coaster RVS  Examination Guidelines: A complete evaluation includes B-mode imaging, spectral Doppler, color Doppler, and power Doppler as needed of all accessible portions of each vessel. Bilateral testing is considered an integral part of a complete examination. Limited examinations for reoccurring indications may be performed as noted.  Right Carotid Findings: +----------+--------+--------+--------+------------------+--------+             PSV cm/s EDV cm/s Stenosis Plaque Description Comments  +----------+--------+--------+--------+------------------+--------+  CCA Prox   87       9                                               +----------+--------+--------+--------+------------------+--------+  CCA Mid    85       15                                             +----------+--------+--------+--------+------------------+--------+  CCA Distal 50       7                                              +----------+--------+--------+--------+------------------+--------+  ICA Prox   27       7                                              +----------+--------+--------+--------+------------------+--------+  ICA Mid    61       15                                             +----------+--------+--------+--------+------------------+--------+  ICA Distal 86       20                                             +----------+--------+--------+--------+------------------+--------+  ECA        79       0                                              +----------+--------+--------+--------+------------------+--------+ +----------+--------+-------+--------+-------------------+             PSV cm/s EDV  cms Describe Arm Pressure (mmHG)  +----------+--------+-------+--------+-------------------+  Subclavian 119      0                                     +----------+--------+-------+--------+-------------------+ +---------+--------+--+--------+-+  Vertebral PSV cm/s 38 EDV cm/s 8  +---------+--------+--+--------+-+  Left Carotid Findings: +----------+--------+--------+--------+------------------+--------+             PSV cm/s EDV cm/s Stenosis Plaque Description Comments  +----------+--------+--------+--------+------------------+--------+  CCA Prox   84       13                                             +----------+--------+--------+--------+------------------+--------+  CCA Mid    93       16                                             +----------+--------+--------+--------+------------------+--------+  CCA Distal 97       13                                             +----------+--------+--------+--------+------------------+--------+  ICA Prox   32       7                                               +----------+--------+--------+--------+------------------+--------+  ICA Mid    64       18                                             +----------+--------+--------+--------+------------------+--------+  ICA Distal 87       19                                             +----------+--------+--------+--------+------------------+--------+  ECA        65       5                                              +----------+--------+--------+--------+------------------+--------+ +----------+--------+--------+--------+-------------------+             PSV cm/s EDV cm/s Describe Arm Pressure (mmHG)  +----------+--------+--------+--------+-------------------+  Subclavian 138      0                                      +----------+--------+--------+--------+-------------------+ +---------+--------+--+--------+-+  Vertebral PSV cm/s 35 EDV cm/s 9  +---------+--------+--+--------+-+  Summary: Right Carotid: Velocities in the right ICA are consistent with a 1-39% stenosis. Left Carotid:  Velocities in the left ICA are consistent with a 1-39% stenosis. Vertebrals:  Bilateral vertebral arteries demonstrate antegrade flow. Subclavians: Normal flow hemodynamics were seen in bilateral subclavian              arteries. *See table(s) above for measurements and observations.  Electronically signed by Hortencia Pilar MD on 08/10/2019 at 5:18:00 PM.    Final    Vas [place] Lower Extremity Arterial Duplex  Result Date: 08/10/2019 LOWER EXTREMITY ARTERIAL DUPLEX STUDY  Current ABI: Not Obtained Performing Technologist: Almira Coaster RVS  Examination Guidelines: A complete evaluation includes B-mode imaging, spectral Doppler, color Doppler, and power Doppler as needed of all accessible portions of each vessel. Bilateral testing is considered an integral part of a complete examination. Limited examinations for reoccurring indications may be performed as noted.   +---------------+-------+-----------+---------+--------+-----+--------+  Right Popliteal AP (cm) Transv (cm) Waveform  Stenosis Shape Comments  +---------------+-------+-----------+---------+--------+-----+--------+  Proximal        1.41    1.55        triphasic                          +---------------+-------+-----------+---------+--------+-----+--------+  Mid                                 triphasic                          +---------------+-------+-----------+---------+--------+-----+--------+  Distal          1.61    1.81                                           +---------------+-------+-----------+---------+--------+-----+--------+ +--------------+-------+-----------+---------+--------+-----+--------+  Left Popliteal AP (cm) Transv (cm) Waveform  Stenosis Shape Comments  +--------------+-------+-----------+---------+--------+-----+--------+  Proximal       0.73    0.89        triphasic                          +--------------+-------+-----------+---------+--------+-----+--------+  Mid                                triphasic                          +--------------+-------+-----------+---------+--------+-----+--------+  Distal         1.05    1.12        triphasic                          +--------------+-------+-----------+---------+--------+-----+--------+  Summary: Right: There appears to be no significant change in the Right Popliteal Artery. Left: Imaged the Left Popliteal Artery which displayed no evidence of An Aneurysm.  See table(s) above for measurements and observations. Electronically signed by Hortencia Pilar MD on 08/10/2019 at 5:17:51 PM.    Final     Assessment/Plan 1. Bilateral carotid artery stenosis Recommend:  Given the patient's asymptomatic subcritical stenosis no further invasive testing or surgery at this time.  Duplex ultrasound shows <40% stenosis bilaterally.  Continue antiplatelet therapy as prescribed Continue management of CAD, HTN and Hyperlipidemia Healthy heart  diet,  encouraged exercise  at least 4 times per week Follow up in 12 months with duplex ultrasound and physical exam   - VAS US CAROTID; Future  2. Popliteal artery aneurysm (HCC) No surgery or intervention at this time.  The patient has an asymptomatic popliteal artery aneurysm that is less than 2.5 cm in maximal diameter.  I have discussed the natural history of popliteal aneurysm and the small risk of thrombosis for aneurysm less than 2.5 cm in size.  However, as these small aneurysms tend to enlarge over time, continued surveillance with ultrasound is mandatory.   I have also discussed optimizing medical management with hypertension and lipid control and the importance of abstinence from tobacco.  The patient is also encouraged to exercise a minimum of 30 minutes 4 times a week.   Should the patient develop new leg pain or signs of peripheral embolization they are instructed to seek medical attention immediately and to alert the physician providing care that they have an aneurysm.  The patient voices their understanding.  - VAS [place] LOWER EXTREMITY ARTERIAL DUPLEX; Future  3. Peripheral vascular disease ([place])  Recommend:  The patient has evidence of atherosclerosis of the lower extremities with claudication.  The patient does not voice lifestyle limiting changes at this point in time.  Noninvasive studies do not suggest clinically significant change.  No invasive studies, angiography or surgery at this time The patient should continue walking and begin a more formal exercise program.  The patient should continue antiplatelet therapy and aggressive treatment of the lipid abnormalities  No changes in the patient's medications at this time  The patient should continue wearing graduated compression socks 10-15 mmHg strength to control the mild edema.    4. Persistent atrial fibrillation Continue antiarrhythmia medications as already ordered, these medications have been reviewed and  there are no changes at this time.  Continue anticoagulation as ordered by Cardiology Service   5. Coronary artery disease of native artery of native heart with stable angina pectoris (HCC) Continue cardiac and antihypertensive medications as already ordered and reviewed, no changes at this time.  Continue statin as ordered and reviewed, no changes at this time  Nitrates PRN for chest pain   6. Chronic obstructive pulmonary disease, unspecified COPD type ([place]) Continue pulmonary medications and aerosols as already ordered, these medications have been reviewed and there are no changes at this time.     Hortencia Pilar, MD  08/10/2019 11:04 PM

## 2019-08-14 DIAGNOSIS — I724 Aneurysm of artery of lower extremity: Secondary | ICD-10-CM | POA: Insufficient documentation

## 2019-08-15 DIAGNOSIS — D2262 Melanocytic nevi of left upper limb, including shoulder: Secondary | ICD-10-CM | POA: Diagnosis not present

## 2019-08-15 DIAGNOSIS — Z08 Encounter for follow-up examination after completed treatment for malignant neoplasm: Secondary | ICD-10-CM | POA: Diagnosis not present

## 2019-08-15 DIAGNOSIS — D225 Melanocytic nevi of trunk: Secondary | ICD-10-CM | POA: Diagnosis not present

## 2019-08-15 DIAGNOSIS — Z85828 Personal history of other malignant neoplasm of skin: Secondary | ICD-10-CM | POA: Diagnosis not present

## 2019-08-15 DIAGNOSIS — D2272 Melanocytic nevi of left lower limb, including hip: Secondary | ICD-10-CM | POA: Diagnosis not present

## 2019-08-15 DIAGNOSIS — L570 Actinic keratosis: Secondary | ICD-10-CM | POA: Diagnosis not present

## 2019-08-15 DIAGNOSIS — D485 Neoplasm of uncertain behavior of skin: Secondary | ICD-10-CM | POA: Diagnosis not present

## 2019-08-15 DIAGNOSIS — D2261 Melanocytic nevi of right upper limb, including shoulder: Secondary | ICD-10-CM | POA: Diagnosis not present

## 2019-08-15 DIAGNOSIS — L821 Other seborrheic keratosis: Secondary | ICD-10-CM | POA: Diagnosis not present

## 2019-08-15 DIAGNOSIS — L728 Other follicular cysts of the skin and subcutaneous tissue: Secondary | ICD-10-CM | POA: Diagnosis not present

## 2019-08-15 DIAGNOSIS — D2271 Melanocytic nevi of right lower limb, including hip: Secondary | ICD-10-CM | POA: Diagnosis not present

## 2019-08-22 DIAGNOSIS — H2511 Age-related nuclear cataract, right eye: Secondary | ICD-10-CM | POA: Diagnosis not present

## 2019-10-12 DIAGNOSIS — M25561 Pain in right knee: Secondary | ICD-10-CM | POA: Diagnosis not present

## 2019-10-12 DIAGNOSIS — E669 Obesity, unspecified: Secondary | ICD-10-CM | POA: Diagnosis not present

## 2019-10-12 DIAGNOSIS — M1711 Unilateral primary osteoarthritis, right knee: Secondary | ICD-10-CM | POA: Diagnosis not present

## 2019-11-19 ENCOUNTER — Ambulatory Visit
Admission: EM | Admit: 2019-11-19 | Discharge: 2019-11-19 | Disposition: A | Discharge status: Home / Self Care | Payer: PPO | Attending: Family Medicine | Admitting: Family Medicine

## 2019-11-19 ENCOUNTER — Ambulatory Visit (INDEPENDENT_AMBULATORY_CARE_PROVIDER_SITE_OTHER): Payer: PPO

## 2019-11-19 ENCOUNTER — Other Ambulatory Visit: Payer: Self-pay

## 2019-11-19 DIAGNOSIS — U071 COVID-19: Secondary | ICD-10-CM

## 2019-11-19 DIAGNOSIS — R0602 Shortness of breath: Secondary | ICD-10-CM

## 2019-11-19 DIAGNOSIS — J441 Chronic obstructive pulmonary disease with (acute) exacerbation: Secondary | ICD-10-CM

## 2019-11-19 HISTORY — DX: COVID-19: U07.1

## 2019-11-19 MED ORDER — ALBUTEROL SULFATE HFA 108 (90 BASE) MCG/ACT IN AERS: 1.0000 | INHALATION_SPRAY | Freq: Four times a day (QID) | RESPIRATORY_TRACT | 0 refills | Status: DC | PRN

## 2019-11-19 MED ORDER — PREDNISONE 50 MG PO TABS: ORAL_TABLET | ORAL | 0 refills | Status: DC

## 2019-11-19 NOTE — ED Triage Notes (Signed)
Pt. States he was blowing leaves even though he did have a ,ask on, he has developed chest congestion, nasal congestion ever since. 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

## 2019-11-19 NOTE — Discharge Instructions (Signed)
Medications as directed.  Follow up with Pulmonology.  Take care  Dr. Lacinda Axon

## 2019-11-19 NOTE — ED Provider Notes (Signed)
[facility]    CSN: [id number] Arrival date & time: 11/19/19  1038  History   Chief Complaint Chest congestion  HPI  78 year old male presents with chest congestion.  Patient states that he has been experiencing symptoms for the past few days.  He reports that started after he was blowing leaves.  He states that he was wearing a mask but is concerned that he inhaled dust.  Patient reports he is having chest congestion predominantly.  He has shortness of breath.  He has some baseline shortness of breath due to his lung disease.  Patient reports that he tested positive for Covid approximately 6 weeks ago.  He states that he was later informed by the health department that he had a false positive.  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  No medications or interventions tried.  No other complaints.   PMH, Surgical Hx, Family Hx, Social History reviewed and updated as below.  PMH: Patient Active Problem List   Diagnosis Date Noted  . 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 Cellulitis of leg, left 11/07/2018  . Lymphedema 08/17/2018  . Carotid stenosis 08/14/2018  . CAD (coronary artery disease) 08/12/2018  . COPD (chronic obstructive pulmonary disease) ([place]) 08/12/2018  . Gout 08/12/2018  . Hyperglycemia 08/12/2018  . Mixed hyperlipidemia 08/12/2018  . Peripheral vascular disease ([place]) 08/12/2018  . Bilateral leg edema 07/12/2018  . SOBOE (shortness of breath on exertion) 07/12/2018  . Left wrist pain 10/07/2017  . Morbid obesity ([place]) 10/07/2017  . Radial styloid tenosynovitis (de qu[place]) 10/07/2017  . Senile purpura ([place]) 09/20/2017  . 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 Pulmonary fibrosis ([place]) 03/13/2016  . 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 Essential tremor 11/05/2015  . Persistent atrial fibrillation ([place]) 09/30/2015  . Benign essential hypertension 05/10/2015  . 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   . Right knee pain 02/26/2015  . 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/12/2015  . OSA (obstructive sleep apnea) 02/12/2015   Past Surgical History:  Procedure Laterality Date  . CARDIAC SURGERY    . CARDIOVERSION N/A 03/03/2018   Procedure: CARDIOVERSION;  Surgeon: [name], MD;  Location: [facility] ORS;  Service: Cardiovascular;  Laterality: N/A;  . CARDIOVERSION N/A 05/24/2018   Procedure: CARDIOVERSION;  Surgeon: [name], MD;  Location: [facility] ORS;  Service: Cardiovascular;  Laterality: N/A;  . EYE SURGERY    . LEG SURGERY Left    stents placed  . TONSILLECTOMY     Home Medications    Prior to Admission medications   Medication Sig Start Date End Date Taking? Authorizing Provider  acetaminophen (TYLENOL) 650 MG CR tablet Take 1,300 mg by mouth every 8 (eight) hours as needed for pain.     [provider]  albuterol (VENTOLIN HFA) 108 (90 Base) MCG/ACT inhaler Inhale 1-2 puffs into the lungs every 6 (six) hours as needed for wheezing or shortness of breath. 11/19/19   Coral Spikes, DO  ALPRAZolam Duanne Moron) 0.25 MG tablet Take 0.25 mg by mouth 3 (three) times daily as needed for anxiety. 05/05/18   [provider]  amiodarone (PACERONE) 200 MG tablet Take 200 mg by mouth daily.  04/29/18   [provider]  b complex vitamins capsule Take by mouth.    [provider]  benazepril (LOTENSIN) 40 MG tablet Take 40 mg by mouth daily.  07/06/17   [provider]  diltiazem (CARDIZEM CD) 120 MG 24 hr capsule  Take by mouth. 07/04/18   [provider]  ELIQUIS 5 MG TABS tablet Take 5 mg by mouth 2 (two) times daily.  08/31/17   [provider]  etodolac (LODINE) 500 MG tablet TAKE 1 TABLET BY MOUTH TWICE DAILY AS NEEDED FOR GOUT PAIN 04/11/19   [provider]  furosemide (LASIX) 40 MG tablet TAKE 40 MG BY MOUTH ONCE DAILY AS NEEDED FOR EDEMA 03/05/16   [provider]  metoprolol succinate (TOPROL-XL) 25 MG 24 hr  tablet Take by mouth. 09/07/18 09/07/19  [provider]  Naphazoline HCl (CLEAR EYES OP) Place 1 drop into both eyes daily as needed (for dry eyes).    [provider]  predniSONE (DELTASONE) 50 MG tablet 1 tablet daily x 5 days 11/19/19   Coral Spikes, DO   Family History Family History  Problem Relation Age of Onset  . Thyroid disease Mother   . Macular degeneration Mother   . Cancer Father    Social History Social History   Tobacco Use  . Smoking status: Former Smoker    Quit date: 02/16/1979    Years since quitting: 40.7  . Smokeless tobacco: Never Used  Substance Use Topics  . Alcohol use: Yes    Comment: rarely  . Drug use: No   Allergies   Tramadol, Hydrochlorothiazide, Iodine, and Shellfish allergy  Review of Systems Review of Systems  Constitutional: Negative for fever.  Respiratory: Positive for shortness of breath.        Chest congestion.   Physical Exam Triage Vital Signs ED Triage Vitals  Enc Vitals Group     BP 11/19/19 1048 (!) 142/95     Pulse Rate 11/19/19 1048 90     Resp 11/19/19 1048 18     Temp 11/19/19 1048 98.5 F (36.9 C)     Temp Source 11/19/19 1048 Oral     SpO2 11/19/19 1048 95 %     Weight 11/19/19 1045 230 lb (104.3 kg)     Height --      Head Circumference --      Peak Flow --      Pain Score 11/19/19 1045 0     Pain Loc --      Pain Edu? --      Excl. in [place]? --    Updated Vital Signs BP (!) 142/95 (BP Location: Right Arm)   Pulse 90   Temp 98.5 F (36.9 C) (Oral)   Resp 18   Wt 104.3 kg   SpO2 95%   BMI 32.08 kg/m   Visual Acuity Right Eye Distance:   Left Eye Distance:   Bilateral Distance:    Right Eye Near:   Left Eye Near:    Bilateral Near:     Physical Exam Vitals and nursing note reviewed.  Constitutional:      Appearance: He is not toxic-appearing or diaphoretic.     Comments: Does not appear ill.  Mild increased work of breathing noted.  HENT:     Head: Normocephalic and  atraumatic.     Nose: Nose normal.  Eyes:     General:        Right eye: No discharge.        Left eye: No discharge.     Conjunctiva/sclera: Conjunctivae normal.  Cardiovascular:     Comments: Irregularly irregular. Pulmonary:     Breath sounds: No rhonchi.     Comments: About increased work of breathing noted.  Crackles noted.  Skin:    General: Skin is warm.     Findings: No rash.  Neurological:     Mental Status: He is alert.  Psychiatric:        Mood and Affect: Mood normal.        Behavior: Behavior normal.    UC Treatments / Results  Labs (all labs ordered are listed, but only abnormal results are displayed) Labs Reviewed  NOVEL CORONAVIRUS, NAA (HOSP ORDER, SEND-OUT TO REF LAB; TAT 18-24 HRS)    EKG   Radiology DG Chest 2 View  Result Date: 11/19/2019 CLINICAL DATA:  Pt c/o SOB with congestion and chest pressure x 3 days. 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 Hx pneumonia. Former smoker. EXAM: CHEST - 2 VIEW COMPARISON:  Chest radiograph 11/05/2012 FINDINGS: Stable cardiomediastinal contours status post median sternotomy. Aortic arch calcification. There are diffuse bilateral coarse reticular opacities with basilar and peripheral predominance, similar in appearance to the prior radiograph from 2013. No pneumothorax or pleural effusion. No acute finding in the visualized skeleton. IMPRESSION: Bilateral diffuse coarse reticular opacities are similar in appearance to radiographs from 2013 and consistent with chronic interstitial lung disease. Difficult to exclude a mild acute component superimposed on chronic change. Electronically Signed   By: Audie Pinto M.D.   On: 11/19/2019 12:32    Procedures Procedures (including critical care time)  Medications Ordered in UC Medications - No data to display  Initial Impression / Assessment and Plan / UC Course  I have reviewed the triage vital signs and the nursing notes.  Pertinent labs &  imaging results that were available during my care of the patient were reviewed by me and considered in my medical decision making (see chart for details).    78 year old male presents with suspected COPD exacerbation.  No appreciable infiltrate on x-ray.  Difficult to discern given coarse reticular opacities from chronic interstitial lung disease.  Placing on prednisone and albuterol.  Advised to follow-up with pulmonology.  Awaiting Covid test results.  Final Clinical Impressions(s) / UC Diagnoses   Final diagnoses:  COPD exacerbation [facility])     Discharge Instructions     Medications as directed.  Follow up with Pulmonology.  Take care  Dr. Lacinda Axon     ED Prescriptions    Medication Sig Dispense Auth. Provider   predniSONE (DELTASONE) 50 MG tablet 1 tablet daily x 5 days 5 tablet Donyale Berthold G, DO   albuterol (VENTOLIN HFA) 108 (90 Base) MCG/ACT inhaler Inhale 1-2 puffs into the lungs every 6 (six) hours as needed for wheezing or shortness of breath. 18 g Coral Spikes, DO     PDMP not reviewed this encounter.   Coral Spikes, [place] 11/19/19 1241

## 2019-11-20 LAB — NOVEL CORONAVIRUS, NAA (HOSP ORDER, SEND-OUT TO REF LAB; TAT 18-24 HRS): SARS-CoV-2, NAA: DETECTED — AB

## 2019-11-21 ENCOUNTER — Telehealth: Payer: Self-pay | Admitting: *Deleted

## 2019-11-21 ENCOUNTER — Telehealth: Payer: Self-pay | Admitting: Unknown Physician Specialty

## 2019-11-21 ENCOUNTER — Telehealth: Payer: Self-pay

## 2019-11-21 NOTE — Telephone Encounter (Signed)
Spoke with patient and went over the guidelines again

## 2019-11-21 NOTE — Telephone Encounter (Signed)
Pt called for result of COVID test from 11/19/2019 at [facility] due to headache and chest congestion; the pt says his employer told him that he needed to quarantine for 10 more days; he hadhe says that he tested positive 6 weeks prior, he states the Health Dept called him 7 days after he went into quarantine for 10 days; reviewed criteria for quarantine: All persons with fever and respiratory symptoms should isolate themselves until ALL conditions listed below are met: - at least 10 days since symptoms onset - AND 3 consecutive days fever free without antipyretics (acetaminophen [Tylenol] or ibuprofen [Advil]) - AND improvement in respiratory symptoms also advised pt to follow the guidelines per his employer, and to contact his PCP for additional recommendations; he verbalized understanding; will notify [facility]; unable to chart in result note because encounter not created.

## 2019-11-21 NOTE — Telephone Encounter (Signed)
  I connected by phone with [name]. on 11/21/2019 at 3:39 PM to discuss the potential use of an new treatment for mild to moderate COVID-19 viral infection in non-hospitalized patients.  This patient is a 78 y.o. male that meets the FDA criteria for Emergency Use Authorization of bamlanivimab or casirivimab\imdevimab.  Has a (+) direct SARS-CoV-2 viral test result  Has mild or moderate COVID-19   Is ? 78 years of age and weighs ? 40 kg  Is NOT hospitalized due to COVID-19  Is NOT requiring oxygen therapy or requiring an increase in baseline oxygen flow rate due to COVID-19  Is within 10 days of symptom onset  Has at least one of the high risk factor(s) for progression to severe COVID-19 and/or hospitalization as defined in EUA.  Specific high risk criteria : >/= 78 yo   I have spoken and communicated the following to the patient or parent/caregiver:  1. FDA has authorized the emergency use of bamlanivimab and casirivimab\imdevimab for the treatment of mild to moderate COVID-19 in adults and pediatric patients with positive results of direct SARS-CoV-2 viral testing who are 52 years of age and older weighing at least 40 kg, and who are at high risk for progressing to severe COVID-19 and/or hospitalization.  2. The significant known and potential risks and benefits of bamlanivimab and casirivimab\imdevimab, and the extent to which such potential risks and benefits are unknown.  3. Information on available alternative treatments and the risks and benefits of those alternatives, including clinical trials.  4. Patients treated with bamlanivimab and casirivimab\imdevimab should continue to self-isolate and use infection control measures (e.g., wear mask, isolate, social distance, avoid sharing personal items, clean and disinfect "high touch" surfaces, and frequent handwashing) according to CDC guidelines.   5. The patient or parent/caregiver has the option to accept or refuse  bamlanivimab or casirivimab\imdevimab .  After reviewing this information with the patient he will talk over with wife, [name] 11/21/2019 3:39 PM

## 2019-12-11 DIAGNOSIS — Z1159 Encounter for screening for other viral diseases: Secondary | ICD-10-CM | POA: Diagnosis not present

## 2019-12-11 DIAGNOSIS — M1A9XX0 Chronic gout, unspecified, without tophus (tophi): Secondary | ICD-10-CM | POA: Diagnosis not present

## 2019-12-11 DIAGNOSIS — I10 Essential (primary) hypertension: Secondary | ICD-10-CM | POA: Diagnosis not present

## 2019-12-11 DIAGNOSIS — R739 Hyperglycemia, unspecified: Secondary | ICD-10-CM | POA: Diagnosis not present

## 2019-12-11 DIAGNOSIS — D692 Other nonthrombocytopenic purpura: Secondary | ICD-10-CM | POA: Diagnosis not present

## 2019-12-11 DIAGNOSIS — E782 Mixed hyperlipidemia: Secondary | ICD-10-CM | POA: Diagnosis not present

## 2019-12-19 DIAGNOSIS — G4733 Obstructive sleep apnea (adult) (pediatric): Secondary | ICD-10-CM | POA: Diagnosis not present

## 2019-12-19 DIAGNOSIS — G4730 Sleep apnea, unspecified: Secondary | ICD-10-CM | POA: Diagnosis not present

## 2019-12-28 DIAGNOSIS — R0600 Dyspnea, unspecified: Secondary | ICD-10-CM | POA: Diagnosis not present

## 2019-12-28 DIAGNOSIS — I4819 Other persistent atrial fibrillation: Secondary | ICD-10-CM | POA: Diagnosis not present

## 2019-12-28 DIAGNOSIS — I25810 Atherosclerosis of coronary artery bypass graft(s) without angina pectoris: Secondary | ICD-10-CM | POA: Diagnosis not present

## 2019-12-28 DIAGNOSIS — R0602 Shortness of breath: Secondary | ICD-10-CM | POA: Diagnosis not present

## 2019-12-29 DIAGNOSIS — Z9981 Dependence on supplemental oxygen: Secondary | ICD-10-CM | POA: Diagnosis not present

## 2019-12-29 DIAGNOSIS — Z8616 Personal history of COVID-19: Secondary | ICD-10-CM | POA: Diagnosis not present

## 2019-12-29 DIAGNOSIS — J84112 Idiopathic pulmonary fibrosis: Secondary | ICD-10-CM | POA: Diagnosis not present

## 2019-12-29 DIAGNOSIS — R0602 Shortness of breath: Secondary | ICD-10-CM | POA: Diagnosis not present

## 2019-12-29 DIAGNOSIS — G4733 Obstructive sleep apnea (adult) (pediatric): Secondary | ICD-10-CM | POA: Diagnosis not present

## 2019-12-29 DIAGNOSIS — I482 Chronic atrial fibrillation, unspecified: Secondary | ICD-10-CM | POA: Diagnosis not present

## 2020-01-05 DIAGNOSIS — R0902 Hypoxemia: Secondary | ICD-10-CM | POA: Diagnosis not present

## 2020-01-05 DIAGNOSIS — R0600 Dyspnea, unspecified: Secondary | ICD-10-CM | POA: Diagnosis not present

## 2020-01-05 DIAGNOSIS — J84112 Idiopathic pulmonary fibrosis: Secondary | ICD-10-CM | POA: Diagnosis not present

## 2020-01-05 DIAGNOSIS — Z8616 Personal history of COVID-19: Secondary | ICD-10-CM | POA: Diagnosis not present

## 2020-01-08 DIAGNOSIS — J849 Interstitial pulmonary disease, unspecified: Secondary | ICD-10-CM | POA: Diagnosis not present

## 2020-01-08 DIAGNOSIS — J449 Chronic obstructive pulmonary disease, unspecified: Secondary | ICD-10-CM | POA: Diagnosis not present

## 2020-01-19 DIAGNOSIS — I25810 Atherosclerosis of coronary artery bypass graft(s) without angina pectoris: Secondary | ICD-10-CM | POA: Diagnosis not present

## 2020-02-05 ENCOUNTER — Other Ambulatory Visit: Payer: Self-pay | Admitting: Specialist

## 2020-02-05 DIAGNOSIS — J849 Interstitial pulmonary disease, unspecified: Secondary | ICD-10-CM

## 2020-02-05 DIAGNOSIS — R0609 Other forms of dyspnea: Secondary | ICD-10-CM

## 2020-02-05 DIAGNOSIS — J449 Chronic obstructive pulmonary disease, unspecified: Secondary | ICD-10-CM | POA: Diagnosis not present

## 2020-02-05 DIAGNOSIS — R0600 Dyspnea, unspecified: Secondary | ICD-10-CM | POA: Diagnosis not present

## 2020-02-05 DIAGNOSIS — R0902 Hypoxemia: Secondary | ICD-10-CM | POA: Diagnosis not present

## 2020-02-08 DIAGNOSIS — I25810 Atherosclerosis of coronary artery bypass graft(s) without angina pectoris: Secondary | ICD-10-CM | POA: Diagnosis not present

## 2020-02-08 DIAGNOSIS — E782 Mixed hyperlipidemia: Secondary | ICD-10-CM | POA: Diagnosis not present

## 2020-02-08 DIAGNOSIS — I10 Essential (primary) hypertension: Secondary | ICD-10-CM | POA: Diagnosis not present

## 2020-02-08 DIAGNOSIS — I4819 Other persistent atrial fibrillation: Secondary | ICD-10-CM | POA: Diagnosis not present

## 2020-02-09 ENCOUNTER — Ambulatory Visit
Admission: RE | Admit: 2020-02-09 | Discharge: 2020-02-09 | Disposition: A | Discharge status: Home / Self Care | Payer: PPO | Source: Ambulatory Visit | Attending: Specialist | Admitting: Specialist

## 2020-02-09 ENCOUNTER — Other Ambulatory Visit: Payer: Self-pay

## 2020-02-09 DIAGNOSIS — R0609 Other forms of dyspnea: Secondary | ICD-10-CM

## 2020-02-09 DIAGNOSIS — J849 Interstitial pulmonary disease, unspecified: Secondary | ICD-10-CM | POA: Diagnosis not present

## 2020-02-09 DIAGNOSIS — U071 COVID-19: Secondary | ICD-10-CM | POA: Diagnosis not present

## 2020-02-09 DIAGNOSIS — R0600 Dyspnea, unspecified: Secondary | ICD-10-CM | POA: Insufficient documentation

## 2020-02-20 DIAGNOSIS — H34812 Central retinal vein occlusion, left eye, with macular edema: Secondary | ICD-10-CM | POA: Diagnosis not present

## 2020-02-20 DIAGNOSIS — H2511 Age-related nuclear cataract, right eye: Secondary | ICD-10-CM | POA: Diagnosis not present

## 2020-03-01 DIAGNOSIS — M7021 Olecranon bursitis, right elbow: Secondary | ICD-10-CM | POA: Diagnosis not present

## 2020-03-01 DIAGNOSIS — M7581 Other shoulder lesions, right shoulder: Secondary | ICD-10-CM | POA: Diagnosis not present

## 2020-03-07 DIAGNOSIS — J849 Interstitial pulmonary disease, unspecified: Secondary | ICD-10-CM | POA: Diagnosis not present

## 2020-03-07 DIAGNOSIS — J449 Chronic obstructive pulmonary disease, unspecified: Secondary | ICD-10-CM | POA: Diagnosis not present

## 2020-03-15 DIAGNOSIS — H2511 Age-related nuclear cataract, right eye: Secondary | ICD-10-CM | POA: Diagnosis not present

## 2020-03-15 DIAGNOSIS — I4891 Unspecified atrial fibrillation: Secondary | ICD-10-CM | POA: Diagnosis not present

## 2020-03-18 DIAGNOSIS — Z0000 Encounter for general adult medical examination without abnormal findings: Secondary | ICD-10-CM | POA: Diagnosis not present

## 2020-03-18 DIAGNOSIS — I340 Nonrheumatic mitral (valve) insufficiency: Secondary | ICD-10-CM | POA: Diagnosis not present

## 2020-03-18 DIAGNOSIS — D692 Other nonthrombocytopenic purpura: Secondary | ICD-10-CM | POA: Diagnosis not present

## 2020-03-18 DIAGNOSIS — I739 Peripheral vascular disease, unspecified: Secondary | ICD-10-CM | POA: Diagnosis not present

## 2020-03-18 DIAGNOSIS — G4733 Obstructive sleep apnea (adult) (pediatric): Secondary | ICD-10-CM | POA: Diagnosis not present

## 2020-03-18 DIAGNOSIS — I77810 Thoracic aortic ectasia: Secondary | ICD-10-CM | POA: Insufficient documentation

## 2020-03-18 DIAGNOSIS — I724 Aneurysm of artery of lower extremity: Secondary | ICD-10-CM | POA: Diagnosis not present

## 2020-03-18 DIAGNOSIS — M48061 Spinal stenosis, lumbar region without neurogenic claudication: Secondary | ICD-10-CM | POA: Diagnosis not present

## 2020-03-18 DIAGNOSIS — I10 Essential (primary) hypertension: Secondary | ICD-10-CM | POA: Diagnosis not present

## 2020-03-18 DIAGNOSIS — J449 Chronic obstructive pulmonary disease, unspecified: Secondary | ICD-10-CM | POA: Diagnosis not present

## 2020-03-18 DIAGNOSIS — I25810 Atherosclerosis of coronary artery bypass graft(s) without angina pectoris: Secondary | ICD-10-CM | POA: Diagnosis not present

## 2020-03-18 DIAGNOSIS — I4819 Other persistent atrial fibrillation: Secondary | ICD-10-CM | POA: Diagnosis not present

## 2020-03-18 DIAGNOSIS — J8410 Pulmonary fibrosis, unspecified: Secondary | ICD-10-CM | POA: Diagnosis not present

## 2020-03-18 DIAGNOSIS — G250 Essential tremor: Secondary | ICD-10-CM | POA: Diagnosis not present

## 2020-04-12 DIAGNOSIS — R739 Hyperglycemia, unspecified: Secondary | ICD-10-CM | POA: Diagnosis not present

## 2020-04-12 DIAGNOSIS — E782 Mixed hyperlipidemia: Secondary | ICD-10-CM | POA: Diagnosis not present

## 2020-04-12 DIAGNOSIS — I4819 Other persistent atrial fibrillation: Secondary | ICD-10-CM | POA: Diagnosis not present

## 2020-04-12 DIAGNOSIS — I10 Essential (primary) hypertension: Secondary | ICD-10-CM | POA: Diagnosis not present

## 2020-04-12 DIAGNOSIS — M1A9XX0 Chronic gout, unspecified, without tophus (tophi): Secondary | ICD-10-CM | POA: Diagnosis not present

## 2020-04-17 DIAGNOSIS — G4733 Obstructive sleep apnea (adult) (pediatric): Secondary | ICD-10-CM | POA: Diagnosis not present

## 2020-04-17 DIAGNOSIS — J8410 Pulmonary fibrosis, unspecified: Secondary | ICD-10-CM | POA: Diagnosis not present

## 2020-04-17 DIAGNOSIS — I724 Aneurysm of artery of lower extremity: Secondary | ICD-10-CM | POA: Diagnosis not present

## 2020-04-17 DIAGNOSIS — I25810 Atherosclerosis of coronary artery bypass graft(s) without angina pectoris: Secondary | ICD-10-CM | POA: Diagnosis not present

## 2020-04-17 DIAGNOSIS — I10 Essential (primary) hypertension: Secondary | ICD-10-CM | POA: Diagnosis not present

## 2020-04-17 DIAGNOSIS — R600 Localized edema: Secondary | ICD-10-CM | POA: Diagnosis not present

## 2020-04-17 DIAGNOSIS — J449 Chronic obstructive pulmonary disease, unspecified: Secondary | ICD-10-CM | POA: Diagnosis not present

## 2020-04-17 DIAGNOSIS — D692 Other nonthrombocytopenic purpura: Secondary | ICD-10-CM | POA: Diagnosis not present

## 2020-04-17 DIAGNOSIS — G250 Essential tremor: Secondary | ICD-10-CM | POA: Diagnosis not present

## 2020-04-17 DIAGNOSIS — I4819 Other persistent atrial fibrillation: Secondary | ICD-10-CM | POA: Diagnosis not present

## 2020-04-17 DIAGNOSIS — I77810 Thoracic aortic ectasia: Secondary | ICD-10-CM | POA: Diagnosis not present

## 2020-04-17 DIAGNOSIS — I739 Peripheral vascular disease, unspecified: Secondary | ICD-10-CM | POA: Diagnosis not present

## 2020-05-22 DIAGNOSIS — I4891 Unspecified atrial fibrillation: Secondary | ICD-10-CM | POA: Diagnosis not present

## 2020-05-23 ENCOUNTER — Encounter: Payer: Self-pay | Admitting: Ophthalmology

## 2020-05-23 ENCOUNTER — Other Ambulatory Visit: Payer: Self-pay

## 2020-05-30 ENCOUNTER — Other Ambulatory Visit
Admission: RE | Admit: 2020-05-30 | Discharge: 2020-05-30 | Disposition: A | Discharge status: Home / Self Care | Payer: PPO | Source: Ambulatory Visit | Attending: Ophthalmology | Admitting: Ophthalmology

## 2020-05-30 ENCOUNTER — Other Ambulatory Visit: Payer: Self-pay

## 2020-05-30 DIAGNOSIS — Z20822 Contact with and (suspected) exposure to covid-19: Secondary | ICD-10-CM | POA: Diagnosis not present

## 2020-05-30 DIAGNOSIS — Z01812 Encounter for preprocedural laboratory examination: Secondary | ICD-10-CM | POA: Diagnosis not present

## 2020-05-30 LAB — SARS CORONAVIRUS 2 (TAT 6-24 HRS): SARS Coronavirus 2: NEGATIVE

## 2020-05-30 NOTE — Discharge Instructions (Signed)
Cataract Surgery, Care After This sheet gives you information about how to care for yourself after your procedure. Your health care provider may also give you more specific instructions. If you have problems or questions, contact your health care provider. What can I expect after the procedure? After the procedure, it is common to have:  Itching.  Discomfort.  Fluid discharge.  Sensitivity to light and to touch.  Bruising in or around the eye.  Mild blurred vision. Follow these instructions at home: Eye care   Do not touch or rub your eyes.  Protect your eyes as told by your health care provider. You may be told to wear a protective eye shield or sunglasses.  Do not put a contact lens into the affected eye or eyes until your health care provider approves.  Keep the area around your eye clean and dry: ? Avoid swimming. ? Do not allow water to hit you directly in the face while showering. ? Keep soap and shampoo out of your eyes.  Check your eye every day for signs of infection. Watch for: ? Redness, swelling, or pain. ? Fluid, blood, or pus. ? Warmth. ? A bad smell. ? Vision that is getting worse. ? Sensitivity that is getting worse. Activity  Do not drive for 24 hours if you were given a sedative during your procedure.  Avoid strenuous activities, such as playing contact sports, for as long as told by your health care provider.  Do not drive or use heavy machinery until your health care provider approves.  Do not bend or lift heavy objects. Bending increases pressure in the eye. You can walk, climb stairs, and do light household chores.  Ask your health care provider when you can return to work. If you work in a dusty environment, you may be advised to wear protective eyewear for a period of time. General instructions  Take or apply over-the-counter and prescription medicines only as told by your health care provider. This includes eye drops.  Keep all follow-up  visits as told by your health care provider. This is important. Contact a health care provider if:  You have increased bruising around your eye.  You have pain that is not helped with medicine.  You have a fever.  You have redness, swelling, or pain in your eye.  You have fluid, blood, or pus coming from your incision.  Your vision gets worse.  Your sensitivity to light gets worse. Get help right away if:  You have sudden loss of vision.  You see flashes of light or spots (floaters).  You have severe eye pain.  You develop nausea or vomiting. Summary  After your procedure, it is common to have itching, discomfort, bruising, fluid discharge, or sensitivity to light.  Follow instructions from your health care provider about caring for your eye after the procedure.  Do not rub your eye after the procedure. You may need to wear eye protection or sunglasses. Do not wear contact lenses. Keep the area around your eye clean and dry.  Avoid activities that require a lot of effort. These include playing sports and lifting heavy objects.  Contact a health care provider if you have increased bruising, pain that does not go away, or a fever. Get help right away if you suddenly lose your vision, see flashes of light or spots, or have severe pain in the eye. This information is not intended to replace advice given to you by your health care provider. Make sure you discuss  any questions you have with your health care provider. Document Revised: 09/19/2019 Document Reviewed: 05/23/2018 Elsevier Patient Education  2020 [place] Anesthesia, Adult, Care After This sheet gives you information about how to care for yourself after your procedure. Your health care provider may also give you more specific instructions. If you have problems or questions, contact your health care provider. What can I expect after the procedure? After the procedure, the following side effects are  common:  Pain or discomfort at the IV site.  Nausea.  Vomiting.  Sore throat.  Trouble concentrating.  Feeling cold or chills.  Weak or tired.  Sleepiness and fatigue.  Soreness and body aches. These side effects can affect parts of the body that were not involved in surgery. Follow these instructions at home:  For at least 24 hours after the procedure:  Have a responsible adult stay with you. It is important to have someone help care for you until you are awake and alert.  Rest as needed.  Do not: ? Participate in activities in which you could fall or become injured. ? Drive. ? Use heavy machinery. ? Drink alcohol. ? Take sleeping pills or medicines that cause drowsiness. ? Make important decisions or sign legal documents. ? Take care of children on your own. Eating and drinking  Follow any instructions from your health care provider about eating or drinking restrictions.  When you feel hungry, start by eating small amounts of foods that are soft and easy to digest (bland), such as toast. Gradually return to your regular diet.  Drink enough fluid to keep your urine pale yellow.  If you vomit, rehydrate by drinking water, juice, or clear broth. General instructions  If you have sleep apnea, surgery and certain medicines can increase your risk for breathing problems. Follow instructions from your health care provider about wearing your sleep device: ? Anytime you are sleeping, including during daytime naps. ? While taking prescription pain medicines, sleeping medicines, or medicines that make you drowsy.  Return to your normal activities as told by your health care provider. Ask your health care provider what activities are safe for you.  Take over-the-counter and prescription medicines only as told by your health care provider.  If you smoke, do not smoke without supervision.  Keep all follow-up visits as told by your health care provider. This is  important. Contact a health care provider if:  You have nausea or vomiting that does not get better with medicine.  You cannot eat or drink without vomiting.  You have pain that does not get better with medicine.  You are unable to pass urine.  You develop a skin rash.  You have a fever.  You have redness around your IV site that gets worse. Get help right away if:  You have difficulty breathing.  You have chest pain.  You have blood in your urine or stool, or you vomit blood. Summary  After the procedure, it is common to have a sore throat or nausea. It is also common to feel tired.  Have a responsible adult stay with you for the first 24 hours after general anesthesia. It is important to have someone help care for you until you are awake and alert.  When you feel hungry, start by eating small amounts of foods that are soft and easy to digest (bland), such as toast. Gradually return to your regular diet.  Drink enough fluid to keep your urine pale yellow.  Return to  your normal activities as told by your health care provider. Ask your health care provider what activities are safe for you. This information is not intended to replace advice given to you by your health care provider. Make sure you discuss any questions you have with your health care provider. Document Revised: 11/26/2017 Document Reviewed: 07/09/2017 Elsevier Patient Education  [place].

## 2020-06-03 ENCOUNTER — Other Ambulatory Visit: Payer: Self-pay

## 2020-06-03 ENCOUNTER — Encounter: Payer: Self-pay | Admitting: Ophthalmology

## 2020-06-03 ENCOUNTER — Ambulatory Visit
Admission: RE | Admit: 2020-06-03 | Discharge: 2020-06-03 | Disposition: A | Discharge status: Home / Self Care | Payer: PPO | Attending: Ophthalmology | Admitting: Ophthalmology

## 2020-06-03 ENCOUNTER — Encounter
Admission: RE | Disposition: A | Discharge status: Home / Self Care | Payer: Self-pay | Source: Home / Self Care | Attending: Ophthalmology

## 2020-06-03 ENCOUNTER — Ambulatory Visit: Payer: PPO | Admitting: Anesthesiology

## 2020-06-03 DIAGNOSIS — I739 Peripheral vascular disease, unspecified: Secondary | ICD-10-CM | POA: Insufficient documentation

## 2020-06-03 DIAGNOSIS — I2510 Atherosclerotic heart disease of native coronary artery without angina pectoris: Secondary | ICD-10-CM | POA: Insufficient documentation

## 2020-06-03 DIAGNOSIS — J8410 Pulmonary fibrosis, unspecified: Secondary | ICD-10-CM | POA: Insufficient documentation

## 2020-06-03 DIAGNOSIS — H2181 Floppy iris syndrome: Secondary | ICD-10-CM | POA: Diagnosis not present

## 2020-06-03 DIAGNOSIS — Z9981 Dependence on supplemental oxygen: Secondary | ICD-10-CM | POA: Diagnosis not present

## 2020-06-03 DIAGNOSIS — G4730 Sleep apnea, unspecified: Secondary | ICD-10-CM | POA: Insufficient documentation

## 2020-06-03 DIAGNOSIS — Z7901 Long term (current) use of anticoagulants: Secondary | ICD-10-CM | POA: Insufficient documentation

## 2020-06-03 DIAGNOSIS — M1990 Unspecified osteoarthritis, unspecified site: Secondary | ICD-10-CM | POA: Insufficient documentation

## 2020-06-03 DIAGNOSIS — Z95820 Peripheral vascular angioplasty status with implants and grafts: Secondary | ICD-10-CM | POA: Diagnosis not present

## 2020-06-03 DIAGNOSIS — J449 Chronic obstructive pulmonary disease, unspecified: Secondary | ICD-10-CM | POA: Insufficient documentation

## 2020-06-03 DIAGNOSIS — I4891 Unspecified atrial fibrillation: Secondary | ICD-10-CM | POA: Diagnosis not present

## 2020-06-03 DIAGNOSIS — Z791 Long term (current) use of non-steroidal anti-inflammatories (NSAID): Secondary | ICD-10-CM | POA: Diagnosis not present

## 2020-06-03 DIAGNOSIS — Z91013 Allergy to seafood: Secondary | ICD-10-CM | POA: Diagnosis not present

## 2020-06-03 DIAGNOSIS — Z79899 Other long term (current) drug therapy: Secondary | ICD-10-CM | POA: Insufficient documentation

## 2020-06-03 DIAGNOSIS — I10 Essential (primary) hypertension: Secondary | ICD-10-CM | POA: Diagnosis not present

## 2020-06-03 DIAGNOSIS — Z9842 Cataract extraction status, left eye: Secondary | ICD-10-CM | POA: Insufficient documentation

## 2020-06-03 DIAGNOSIS — H2511 Age-related nuclear cataract, right eye: Secondary | ICD-10-CM | POA: Insufficient documentation

## 2020-06-03 DIAGNOSIS — Z888 Allergy status to other drugs, medicaments and biological substances status: Secondary | ICD-10-CM | POA: Insufficient documentation

## 2020-06-03 DIAGNOSIS — Z87891 Personal history of nicotine dependence: Secondary | ICD-10-CM | POA: Diagnosis not present

## 2020-06-03 DIAGNOSIS — H25811 Combined forms of age-related cataract, right eye: Secondary | ICD-10-CM | POA: Diagnosis not present

## 2020-06-03 HISTORY — DX: Atherosclerotic heart disease of native coronary artery without angina pectoris: I25.10

## 2020-06-03 HISTORY — PX: CATARACT EXTRACTION W/PHACO: SHX586

## 2020-06-03 HISTORY — DX: Tremor, unspecified: R25.1

## 2020-06-03 HISTORY — DX: Pulmonary fibrosis, unspecified: J84.10

## 2020-06-03 SURGERY — PHACOEMULSIFICATION, CATARACT, WITH IOL INSERTION
Anesthesia: Monitor Anesthesia Care | Site: Eye | Laterality: Right

## 2020-06-03 MED ORDER — ARMC OPHTHALMIC DILATING DROPS
1.0000 "application " | OPHTHALMIC | Status: DC | PRN
  Administered 2020-06-03 (×3): 1 via OPHTHALMIC

## 2020-06-03 MED ORDER — MIDAZOLAM HCL 2 MG/2ML IJ SOLN
INTRAMUSCULAR | Status: DC | PRN
  Administered 2020-06-03: 1 mg via INTRAVENOUS

## 2020-06-03 MED ORDER — LIDOCAINE HCL (PF) 2 % IJ SOLN
INTRAOCULAR | Status: DC | PRN
  Administered 2020-06-03: 1 mL via INTRAOCULAR

## 2020-06-03 MED ORDER — FENTANYL CITRATE (PF) 100 MCG/2ML IJ SOLN
INTRAMUSCULAR | Status: DC | PRN
  Administered 2020-06-03: 50 ug via INTRAVENOUS

## 2020-06-03 MED ORDER — EPINEPHRINE PF 1 MG/ML IJ SOLN
INTRAOCULAR | Status: DC | PRN
  Administered 2020-06-03: 81 mL via OPHTHALMIC

## 2020-06-03 MED ORDER — MOXIFLOXACIN HCL 0.5 % OP SOLN
OPHTHALMIC | Status: DC | PRN
  Administered 2020-06-03: 0.2 mL via OPHTHALMIC

## 2020-06-03 MED ORDER — SODIUM HYALURONATE 23 MG/ML IO SOLN
INTRAOCULAR | Status: DC | PRN
  Administered 2020-06-03: 0.6 mL via INTRAOCULAR

## 2020-06-03 MED ORDER — SODIUM HYALURONATE 10 MG/ML IO SOLN
INTRAOCULAR | Status: DC | PRN
  Administered 2020-06-03: 0.55 mL via INTRAOCULAR

## 2020-06-03 MED ORDER — LACTATED RINGERS IV SOLN: INTRAVENOUS | Status: DC

## 2020-06-03 MED ORDER — TETRACAINE HCL 0.5 % OP SOLN
1.0000 [drp] | OPHTHALMIC | Status: DC | PRN
  Administered 2020-06-03 (×3): 1 [drp] via OPHTHALMIC

## 2020-06-03 SURGICAL SUPPLY — 20 items
CANNULA ANT/CHMB 27G (MISCELLANEOUS) ×2 IMPLANT
CANNULA ANT/CHMB 27GA (MISCELLANEOUS) ×6 IMPLANT
DISSECTOR HYDRO NUCLEUS 50X22 (MISCELLANEOUS) ×3 IMPLANT
GLOVE SURG LX 7.5 STRW (GLOVE) ×2
GLOVE SURG LX STRL 7.5 STRW (GLOVE) ×1 IMPLANT
GLOVE SURG SYN 8.5  E (GLOVE) ×3
GLOVE SURG SYN 8.5 E (GLOVE) ×1 IMPLANT
GLOVE SURG SYN 8.5 PF PI (GLOVE) ×1 IMPLANT
GOWN STRL REUS W/ TWL LRG LVL3 (GOWN DISPOSABLE) ×2 IMPLANT
GOWN STRL REUS W/TWL LRG LVL3 (GOWN DISPOSABLE) ×6
LENS IOL DIOP 21.0 (Intraocular Lens) ×3 IMPLANT
LENS IOL TECNIS MONO 21.0 (Intraocular Lens) IMPLANT
MARKER SKIN DUAL TIP RULER LAB (MISCELLANEOUS) ×3 IMPLANT
PACK DR. KING ARMS (PACKS) ×3 IMPLANT
PACK EYE AFTER SURG (MISCELLANEOUS) ×3 IMPLANT
PACK OPTHALMIC (MISCELLANEOUS) ×3 IMPLANT
SYR 3ML LL SCALE MARK (SYRINGE) ×3 IMPLANT
SYR TB 1ML LUER SLIP (SYRINGE) ×3 IMPLANT
WATER STERILE IRR 250ML POUR (IV SOLUTION) ×3 IMPLANT
WIPE NON LINTING 3.25X3.25 (MISCELLANEOUS) ×3 IMPLANT

## 2020-06-03 NOTE — Op Note (Signed)
OPERATIVE NOTE  [name] [id number] 06/03/2020   PREOPERATIVE DIAGNOSIS:  Nuclear sclerotic cataract right eye.  H25.11   POSTOPERATIVE DIAGNOSIS:     1.  Nuclear sclerotic cataract right eye.   2.  Intraoperative floppy iris syndrome.   PROCEDURE:  Phacoemusification with posterior chamber intraocular lens placement of the right eye   LENS:   Implant Name Type Inv. Item Serial No. Manufacturer Lot No. LRB No. Used Action  LENS IOL DIOP 21.0 - [id number] Intraocular Lens LENS IOL DIOP 21.0 [id number] [employer]  Right 1 Implanted       Procedure(s) with comments: CATARACT EXTRACTION PHACO AND INTRAOCULAR LENS PLACEMENT (IOC) RIGHT 6.07  00:48.0 (Right) - sleep apnea  DCB00 +21.0   ULTRASOUND TIME: 0 minutes 48 seconds.  CDE 6.07   SURGEON:  Benay Pillow, MD, MPH  ANESTHESIOLOGIST: Anesthesiologist: Page, Adele Barthel, MD CRNA: Silvana Newness, CRNA   ANESTHESIA:  Topical with tetracaine drops augmented with 1% preservative-free intracameral lidocaine.  ESTIMATED BLOOD LOSS: less than 1 mL.   COMPLICATIONS:  None.   DESCRIPTION OF PROCEDURE:  The patient was identified in the holding room and transported to the operating room and placed in the supine position under the operating microscope.  The right eye was identified as the operative eye and it was prepped and draped in the usual sterile ophthalmic fashion.   A 1.0 millimeter clear-corneal paracentesis was made at the 10:30 position. 0.5 ml of preservative-free 1% lidocaine with epinephrine was injected into the anterior chamber.  The anterior chamber was filled with Healon 5 viscoelastic.  A 2.4 millimeter keratome was used to make a near-clear corneal incision at the 8:00 position.  A curvilinear capsulorrhexis was made with a cystotome and capsulorrhexis forceps.  Balanced salt solution was used to hydrodissect and hydrodelineate the nucleus.  There was some mild iris prolapse during the case,  but this was managed by controlling the pressure and using OVD, with minimal damage to the iris.   Phacoemulsification was then used in stop and chop fashion to remove the lens nucleus and epinucleus.  The remaining cortex was then removed using the irrigation and aspiration handpiece. Healon was then placed into the capsular bag to distend it for lens placement.  A lens was then injected into the capsular bag.  The remaining viscoelastic was aspirated.   Wounds were hydrated with balanced salt solution.  The anterior chamber was inflated to a physiologic pressure with balanced salt solution.   Intracameral vigamox 0.1 mL undiluted was injected into the eye and a drop placed onto the ocular surface.  No wound leaks were noted.  The patient was taken to the recovery room in stable condition without complications of anesthesia or surgery  Benay Pillow 06/03/2020, 9:18 AM

## 2020-06-03 NOTE — Anesthesia Postprocedure Evaluation (Signed)
Anesthesia Post Note  Patient: [name].  Procedure(s) Performed: CATARACT EXTRACTION PHACO AND INTRAOCULAR LENS PLACEMENT (IOC) RIGHT 6.07  00:48.0 (Right Eye)     Patient location during evaluation: PACU Anesthesia Type: MAC Level of consciousness: awake and alert Pain management: pain level controlled Vital Signs Assessment: post-procedure vital signs reviewed and stable Respiratory status: spontaneous breathing, nonlabored ventilation, respiratory function stable and patient connected to nasal cannula oxygen Cardiovascular status: stable and blood pressure returned to baseline Postop Assessment: no apparent nausea or vomiting Anesthetic complications: no   No complications documented.  Adele Barthel Kate Sweetman

## 2020-06-03 NOTE — Anesthesia Procedure Notes (Signed)
Procedure Name: MAC Date/Time: 06/03/2020 8:56 AM Performed by: Silvana Newness, CRNA Pre-anesthesia Checklist: Patient identified, Emergency Drugs available, Suction available, Patient being monitored and Timeout performed Patient Re-evaluated:Patient Re-evaluated prior to induction Oxygen Delivery Method: Nasal cannula

## 2020-06-03 NOTE — H&P (Signed)
The History and Physical notes are on paper and have been signed, the H&P update was also signed on the paper on the day of surgery, and are to be scanned.   I have examined the patient and there are no changes to the H&P.   Attestation: 1. The patient's impairment of visual function is believed not to be correctable with a tolerable change in glasses or contact lenses. 2. Cataract (in the operative eye) is believed to be significantly contributing to the patient's visual impairment. 3. The patient desires surgical correction; the risks, benefits and alternatives have been explained; and a reasonable expectation exists that lens surgery will significantly improve both the visual and functional status of the patient.  I certify the statements are true to the best of my knowledge.  Benay Pillow 06/03/2020 9:20 AM

## 2020-06-03 NOTE — Transfer of Care (Signed)
Immediate Anesthesia Transfer of Care Note  Patient: [name].  Procedure(s) Performed: CATARACT EXTRACTION PHACO AND INTRAOCULAR LENS PLACEMENT (IOC) RIGHT 6.07  00:48.0 (Right Eye)  Patient Location: PACU  Anesthesia Type: MAC  Level of Consciousness: awake, alert  and patient cooperative  Airway and Oxygen Therapy: Patient Spontanous Breathing and Patient connected to supplemental oxygen  Post-op Assessment: Post-op Vital signs reviewed, Patient's Cardiovascular Status Stable, Respiratory Function Stable, Patent Airway and No signs of Nausea or vomiting  Post-op Vital Signs: Reviewed and stable  Complications: No complications documented.

## 2020-06-03 NOTE — Anesthesia Preprocedure Evaluation (Signed)
Anesthesia Evaluation  Patient identified by MRN, date of birth, ID band Patient awake    History of Anesthesia Complications Negative for: history of anesthetic complications  Airway Mallampati: II       Dental  (+) Lower Dentures, Upper Dentures   Pulmonary sleep apnea and Oxygen sleep apnea , COPD, former smoker,  Pulmonary fibrosis, 2L oxygen at night or during exertion during the day   Pulmonary exam normal        Cardiovascular hypertension, Pt. on medications + Peripheral Vascular Disease (thoracic aortic ectasia)  Normal cardiovascular exam+ dysrhythmias (afib, on eliquis)      Neuro/Psych negative neurological ROS     GI/Hepatic negative GI ROS, Neg liver ROS,   Endo/Other  BMI 35  Renal/GU negative Renal ROS     Musculoskeletal   Abdominal   Peds  Hematology negative hematology ROS (+)   Anesthesia Other Findings   Reproductive/Obstetrics                             Anesthesia Physical Anesthesia Plan  ASA: IV  Anesthesia Plan: MAC   Post-op Pain Management:    Induction: Intravenous  PONV Risk Score and Plan: 1 and Midazolam, TIVA and Treatment may vary due to age or medical condition  Airway Management Planned: Nasal Cannula and Natural Airway  Additional Equipment: None  Intra-op Plan:   Post-operative Plan:   Informed Consent: I have reviewed the patients History and Physical, chart, labs and discussed the procedure including the risks, benefits and alternatives for the proposed anesthesia with the patient or authorized representative who has indicated his/her understanding and acceptance.       Plan Discussed with: CRNA  Anesthesia Plan Comments:         Anesthesia Quick Evaluation

## 2020-06-04 ENCOUNTER — Encounter: Payer: Self-pay | Admitting: Ophthalmology

## 2020-07-01 DIAGNOSIS — G4733 Obstructive sleep apnea (adult) (pediatric): Secondary | ICD-10-CM | POA: Diagnosis not present

## 2020-07-17 DIAGNOSIS — R739 Hyperglycemia, unspecified: Secondary | ICD-10-CM | POA: Diagnosis not present

## 2020-07-17 DIAGNOSIS — D692 Other nonthrombocytopenic purpura: Secondary | ICD-10-CM | POA: Diagnosis not present

## 2020-07-17 DIAGNOSIS — I10 Essential (primary) hypertension: Secondary | ICD-10-CM | POA: Diagnosis not present

## 2020-07-17 DIAGNOSIS — J449 Chronic obstructive pulmonary disease, unspecified: Secondary | ICD-10-CM | POA: Diagnosis not present

## 2020-07-17 DIAGNOSIS — M1A9XX0 Chronic gout, unspecified, without tophus (tophi): Secondary | ICD-10-CM | POA: Diagnosis not present

## 2020-07-17 DIAGNOSIS — I4819 Other persistent atrial fibrillation: Secondary | ICD-10-CM | POA: Diagnosis not present

## 2020-07-17 DIAGNOSIS — E782 Mixed hyperlipidemia: Secondary | ICD-10-CM | POA: Diagnosis not present

## 2020-07-17 DIAGNOSIS — M48061 Spinal stenosis, lumbar region without neurogenic claudication: Secondary | ICD-10-CM | POA: Diagnosis not present

## 2020-07-23 DIAGNOSIS — J449 Chronic obstructive pulmonary disease, unspecified: Secondary | ICD-10-CM | POA: Diagnosis not present

## 2020-07-23 DIAGNOSIS — D692 Other nonthrombocytopenic purpura: Secondary | ICD-10-CM | POA: Diagnosis not present

## 2020-07-23 DIAGNOSIS — G4733 Obstructive sleep apnea (adult) (pediatric): Secondary | ICD-10-CM | POA: Diagnosis not present

## 2020-07-23 DIAGNOSIS — J8410 Pulmonary fibrosis, unspecified: Secondary | ICD-10-CM | POA: Diagnosis not present

## 2020-07-23 DIAGNOSIS — I739 Peripheral vascular disease, unspecified: Secondary | ICD-10-CM | POA: Diagnosis not present

## 2020-07-23 DIAGNOSIS — I4819 Other persistent atrial fibrillation: Secondary | ICD-10-CM | POA: Diagnosis not present

## 2020-07-23 DIAGNOSIS — M1A9XX0 Chronic gout, unspecified, without tophus (tophi): Secondary | ICD-10-CM | POA: Diagnosis not present

## 2020-07-23 DIAGNOSIS — I10 Essential (primary) hypertension: Secondary | ICD-10-CM | POA: Diagnosis not present

## 2020-07-23 DIAGNOSIS — I25810 Atherosclerosis of coronary artery bypass graft(s) without angina pectoris: Secondary | ICD-10-CM | POA: Diagnosis not present

## 2020-07-23 DIAGNOSIS — R600 Localized edema: Secondary | ICD-10-CM | POA: Diagnosis not present

## 2020-07-23 DIAGNOSIS — I77810 Thoracic aortic ectasia: Secondary | ICD-10-CM | POA: Diagnosis not present

## 2020-07-23 DIAGNOSIS — I724 Aneurysm of artery of lower extremity: Secondary | ICD-10-CM | POA: Diagnosis not present

## 2020-08-05 DIAGNOSIS — Z9981 Dependence on supplemental oxygen: Secondary | ICD-10-CM | POA: Diagnosis not present

## 2020-08-05 DIAGNOSIS — J449 Chronic obstructive pulmonary disease, unspecified: Secondary | ICD-10-CM | POA: Diagnosis not present

## 2020-08-05 DIAGNOSIS — G4733 Obstructive sleep apnea (adult) (pediatric): Secondary | ICD-10-CM | POA: Diagnosis not present

## 2020-08-05 DIAGNOSIS — R0600 Dyspnea, unspecified: Secondary | ICD-10-CM | POA: Diagnosis not present

## 2020-08-05 DIAGNOSIS — Z01818 Encounter for other preprocedural examination: Secondary | ICD-10-CM | POA: Diagnosis not present

## 2020-08-14 ENCOUNTER — Telehealth (INDEPENDENT_AMBULATORY_CARE_PROVIDER_SITE_OTHER): Payer: Self-pay

## 2020-08-14 NOTE — Telephone Encounter (Signed)
The pt called and left a message on the nurses line wanting to verify his appointment and  Know if he had to be NPO I returned the pts call and made him aware of  His two U/S appointments for 9/9 and his appointment to see the provider I also made him aware he does not need to need to be  NPO.

## 2020-08-14 NOTE — Progress Notes (Signed)
MRN : [id number]  [name]. is a 79 y.o. (05-08-1941) male who presents with chief complaint of No chief complaint on file. Marland Kitchen  History of Present Illness:   The patient returns to the office for followup evaluation regarding leg swelling. The swelling has persisted but with the lymph pump is much, much better controlled. The pain associated with swelling is essentially eliminated. There have not been any interval development of a ulcerations or wounds.  The patient denies problems with the pump, noting it is working well and the leggings are in good condition.  Since the previous visit the patient has been wearing graduated compression stockings and using the lymph pump on a routine basis and has noted significant improvement in the lymphedema.   Patient stated the lymph pump has been a very positive factor in her care.  The patient isalsoseen for follow up evaluation of carotid stenosis. The carotid stenosis followed by ultrasound.   The patient denies amaurosis fugax. There is no recent history of TIA symptoms or focal motor deficits. There is no prior documented CVA.  The patient is taking enteric-coated aspirin 81 mg daily.  There is no history of migraine headaches. There is no history of seizures.  The patient has a history of coronary artery disease, no recent episodes of angina or shortness of breath.  There is a history of hyperlipidemia which is being treated with a statin.   Carotid Duplexshowed <39%   Lower extremity duplex shows Rt popliteal 1.3 cm and Lt popliteal 0.94  Triphasic signals bilaterally  (previous lower extremity duplex shows Rt popliteal 1.8 cm and Lt popliteal 1.12  Triphasic signals bilaterally)  No outpatient medications have been marked as taking for the 08/15/20 encounter (Appointment) with Delana Meyer, Dolores Lory, MD.    Past Medical History:  Diagnosis Date  . Allergy   . Arthritis   . Coronary artery disease   .  COVID-19 11/19/2019  . Dysrhythmia   . Hypertension   . Peripheral vascular disease ([place])   . Pulmonary fibrosis ([place])   . Sleep apnea    CPAP  . Tremor     Past Surgical History:  Procedure Laterality Date  . CARDIAC SURGERY    . CARDIOVERSION N/A 03/03/2018   Procedure: CARDIOVERSION;  Surgeon: [name], MD;  Location: [facility];  Service: Cardiovascular;  Laterality: N/A;  . CARDIOVERSION N/A 05/24/2018   Procedure: CARDIOVERSION;  Surgeon: [name], MD;  Location: [facility];  Service: Cardiovascular;  Laterality: N/A;  . CATARACT EXTRACTION W/PHACO Right 06/03/2020   Procedure: CATARACT EXTRACTION PHACO AND INTRAOCULAR LENS PLACEMENT ([place]) RIGHT 6.07  00:48.0;  Surgeon: Eulogio Bear, MD;  Location: [place];  Service: Ophthalmology;  Laterality: Right;  sleep apnea  . CORONARY ARTERY BYPASS GRAFT    . EYE SURGERY    . LEG SURGERY Left    stents placed, Fem-Fem bypass  . TONSILLECTOMY      Social History Social History   Tobacco Use  . Smoking status: Former Smoker    Quit date: 02/16/1979    Years since quitting: 41.5  . Smokeless tobacco: Never Used  Vaping Use  . Vaping Use: Never used  Substance Use Topics  . Alcohol use: Yes    Comment: rarely  . Drug use: No    Family History Family History  Problem Relation Age of Onset  . Thyroid disease Mother   . Macular degeneration Mother   . Cancer Father  Allergies  Allergen Reactions  . Tramadol Other (See Comments)    confusion  . Hydrochlorothiazide Other (See Comments)    worsens gout  . Iodine Rash  . Shellfish Allergy Rash     REVIEW OF SYSTEMS (Negative unless checked)  Constitutional: _0 Weight loss  _1 Fever  _2 Chills Cardiac: _3 Chest pain   _4 Chest pressure   _5 Palpitations   _6 Shortness of breath when laying flat   _7 Shortness of breath with exertion. Vascular:  _8 Pain in legs with walking   _9 Pain in legs at rest  _10 History of DVT   _11 Phlebitis   _12 Swelling  in legs   _13 Varicose veins   _14 Non-healing ulcers Pulmonary:   _15 Uses home oxygen   _16 Productive cough   _17 Hemoptysis   _18 Wheeze  _19 COPD   _20 Asthma Neurologic:  _21 Dizziness   _22 Seizures   _23 History of stroke   _24 History of TIA  _25 Aphasia   _26 Vissual changes   _27 Weakness or numbness in arm   _28 Weakness or numbness in leg Musculoskeletal:   _29 Joint swelling   _30 Joint pain   _31 Low back pain Hematologic:  _32 Easy bruising  _33 Easy bleeding   _34 Hypercoagulable state   _35 Anemic Gastrointestinal:  _36 Diarrhea   _37 Vomiting  _38 Gastroesophageal reflux/heartburn   _39 Difficulty swallowing. Genitourinary:  _40 Chronic kidney disease   _41 Difficult urination  _42 Frequent urination   _43 Blood in urine Skin:  _44 Rashes   _45 Ulcers  Psychological:  _46 History of anxiety   _47  History of major depression.  Physical Examination  There were no vitals filed for this visit. There is no height or weight on file to calculate BMI. Gen: WD/WN, NAD Head: [place]/AT, No temporalis wasting.  Ear/Nose/Throat: Hearing grossly intact, nares w/o erythema or drainage Eyes: PER, EOMI, sclera nonicteric.  Neck: Supple, no large masses.   Pulmonary:  Good air movement, no audible wheezing bilaterally, no use of accessory muscles.  Cardiac: RRR, no JVD Vascular: scattered varicosities present bilaterally.  Moderate venous stasis changes to the legs bilaterally.  3+ soft pitting edema Vessel Right Left  Radial Palpable Palpable  Popliteal Broadened Palpable Broadened Palpable  PT Palpable Palpable  DP Palpable Palpable  Gastrointestinal: Non-distended. No guarding/no peritoneal signs.  Musculoskeletal: M/S 5/5 throughout.  No deformity or atrophy.  Neurologic: CN 2-12 intact. Symmetrical.  Speech is fluent. Motor exam as listed above. Psychiatric: Judgment intact, Mood & affect appropriate for pt's clinical situation. Dermatologic: Venous rashes no ulcers noted.  No changes consistent with cellulitis. Lymph : mild  lichenification and  skin changes of chronic lymphedema.  CBC Lab Results  Component Value Date   WBC 9.5 09/09/2015   HGB 13.1 09/09/2015   HCT 39.5 (L) 09/09/2015   MCV 86.3 09/09/2015   PLT 181 09/09/2015    BMET    Component Value Date/Time   NA 138 01/28/2012 0550   K 4.4 01/28/2012 0550   CL 103 01/28/2012 0550   CO2 26 01/28/2012 0550   GLUCOSE 110 (H) 01/28/2012 0550   BUN 19 (H) 01/28/2012 0550   CREATININE 0.85 01/28/2012 0550   CALCIUM 8.7 01/28/2012 0550   GFRNONAA >60 01/28/2012 0550   GFRAA >60 01/28/2012 0550   CrCl cannot be calculated (Patient's most recent lab result is older than the maximum 21 days allowed.).  COAG Lab Results  Component Value Date   INR 1.45 09/09/2015   INR 1.9 01/29/2012   INR 1.7 01/28/2012    Radiology No results found.   Assessment/Plan 1. Popliteal artery aneurysm (HCC) No surgery or intervention at this time.  The patient has  an asymptomatic popliteal artery aneurysm that is less than 2.5 cm in maximal diameter.  I have discussed the natural history of popliteal aneurysm and the small risk of thrombosis for aneurysm less than 2.5 cm in size.  However, as these small aneurysms tend to enlarge over time, continued surveillance with ultrasound is mandatory.   I have also discussed optimizing medical management with hypertension and lipid control and the importance of abstinence from tobacco.  The patient is also encouraged to exercise a minimum of 30 minutes 4 times a week.   Should the patient develop new leg pain or signs of peripheral embolization they are instructed to seek medical attention immediately and to alert the physician providing care that they have an aneurysm.  The patient voices their understanding. - VAS [place] LOWER EXTREMITY ARTERIAL DUPLEX; Future  2. Bilateral carotid artery stenosis Recommend:  Given the patient's asymptomatic subcritical stenosis no further invasive testing or surgery at this time.  Duplex  ultrasound shows <40% stenosis bilaterally.  Continue antiplatelet therapy as prescribed Continue management of CAD, HTN and Hyperlipidemia Healthy heart diet,  encouraged exercise at least 4 times per week Follow up in 12 months with duplex ultrasound and physical exam   - VAS US CAROTID; Future  3. Peripheral vascular disease ([place]) Recommend:  The patient has evidence of atherosclerosis of the lower extremities with claudication.  The patient does not voice lifestyle limiting changes at this point in time.  Noninvasive studies do not suggest clinically significant change.  No invasive studies, angiography or surgery at this time The patient should continue walking and begin a more formal exercise program.  The patient should continue antiplatelet therapy and aggressive treatment of the lipid abnormalities  No changes in the patient's medications at this time  The patient should continue wearing graduated compression socks 10-15 mmHg strength to control the mild edema.  - VAS [place] ABI WITH/WO TBI; Future  4. Coronary artery disease of native artery of native heart with stable angina pectoris (HCC) Continue cardiac and antihypertensive medications as already ordered and reviewed, no changes at this time.  Continue statin as ordered and reviewed, no changes at this time  Nitrates PRN for chest pain   5. Persistent atrial fibrillation (HCC) Continue antiarrhythmia medications as already ordered, these medications have been reviewed and there are no changes at this time.  Continue anticoagulation as ordered by Cardiology Service   6. Chronic obstructive pulmonary disease, unspecified COPD type ([place]) Continue pulmonary medications and aerosols as already ordered, these medications have been reviewed and there are no changes at this time.    7. Mixed hyperlipidemia Continue statin as ordered and reviewed, no changes at this time    Hortencia Pilar, MD  08/14/2020 6:44  PM

## 2020-08-15 ENCOUNTER — Other Ambulatory Visit: Payer: Self-pay

## 2020-08-15 ENCOUNTER — Ambulatory Visit (INDEPENDENT_AMBULATORY_CARE_PROVIDER_SITE_OTHER): Payer: PPO

## 2020-08-15 ENCOUNTER — Ambulatory Visit (INDEPENDENT_AMBULATORY_CARE_PROVIDER_SITE_OTHER): Payer: PPO | Admitting: Vascular Surgery

## 2020-08-15 ENCOUNTER — Encounter (INDEPENDENT_AMBULATORY_CARE_PROVIDER_SITE_OTHER): Payer: Self-pay | Admitting: Vascular Surgery

## 2020-08-15 VITALS — BP 150/77 | HR 80 | Ht 69.0 in | Wt 236.0 lb

## 2020-08-15 DIAGNOSIS — I6523 Occlusion and stenosis of bilateral carotid arteries: Secondary | ICD-10-CM | POA: Diagnosis not present

## 2020-08-15 DIAGNOSIS — I25118 Atherosclerotic heart disease of native coronary artery with other forms of angina pectoris: Secondary | ICD-10-CM

## 2020-08-15 DIAGNOSIS — I739 Peripheral vascular disease, unspecified: Secondary | ICD-10-CM

## 2020-08-15 DIAGNOSIS — I724 Aneurysm of artery of lower extremity: Secondary | ICD-10-CM

## 2020-08-15 DIAGNOSIS — J449 Chronic obstructive pulmonary disease, unspecified: Secondary | ICD-10-CM | POA: Diagnosis not present

## 2020-08-15 DIAGNOSIS — E782 Mixed hyperlipidemia: Secondary | ICD-10-CM

## 2020-08-15 DIAGNOSIS — I4819 Other persistent atrial fibrillation: Secondary | ICD-10-CM | POA: Diagnosis not present

## 2020-08-19 DIAGNOSIS — I6523 Occlusion and stenosis of bilateral carotid arteries: Secondary | ICD-10-CM | POA: Diagnosis not present

## 2020-08-19 DIAGNOSIS — I25810 Atherosclerosis of coronary artery bypass graft(s) without angina pectoris: Secondary | ICD-10-CM | POA: Diagnosis not present

## 2020-08-19 DIAGNOSIS — R0600 Dyspnea, unspecified: Secondary | ICD-10-CM | POA: Diagnosis not present

## 2020-08-19 DIAGNOSIS — E782 Mixed hyperlipidemia: Secondary | ICD-10-CM | POA: Diagnosis not present

## 2020-08-19 DIAGNOSIS — I739 Peripheral vascular disease, unspecified: Secondary | ICD-10-CM | POA: Diagnosis not present

## 2020-08-19 DIAGNOSIS — I77810 Thoracic aortic ectasia: Secondary | ICD-10-CM | POA: Diagnosis not present

## 2020-08-19 DIAGNOSIS — R0602 Shortness of breath: Secondary | ICD-10-CM | POA: Diagnosis not present

## 2020-08-19 DIAGNOSIS — R0989 Other specified symptoms and signs involving the circulatory and respiratory systems: Secondary | ICD-10-CM | POA: Diagnosis not present

## 2020-08-19 DIAGNOSIS — I4819 Other persistent atrial fibrillation: Secondary | ICD-10-CM | POA: Diagnosis not present

## 2020-09-18 DIAGNOSIS — I4819 Other persistent atrial fibrillation: Secondary | ICD-10-CM | POA: Diagnosis not present

## 2020-09-18 DIAGNOSIS — I10 Essential (primary) hypertension: Secondary | ICD-10-CM | POA: Diagnosis not present

## 2020-09-18 DIAGNOSIS — I25810 Atherosclerosis of coronary artery bypass graft(s) without angina pectoris: Secondary | ICD-10-CM | POA: Diagnosis not present

## 2020-09-18 DIAGNOSIS — I77810 Thoracic aortic ectasia: Secondary | ICD-10-CM | POA: Diagnosis not present

## 2020-09-18 DIAGNOSIS — R0600 Dyspnea, unspecified: Secondary | ICD-10-CM | POA: Diagnosis not present

## 2020-09-18 DIAGNOSIS — R0602 Shortness of breath: Secondary | ICD-10-CM | POA: Diagnosis not present

## 2020-09-18 DIAGNOSIS — I739 Peripheral vascular disease, unspecified: Secondary | ICD-10-CM | POA: Diagnosis not present

## 2020-09-18 DIAGNOSIS — R600 Localized edema: Secondary | ICD-10-CM | POA: Diagnosis not present

## 2020-09-18 DIAGNOSIS — E782 Mixed hyperlipidemia: Secondary | ICD-10-CM | POA: Diagnosis not present

## 2020-09-18 DIAGNOSIS — I340 Nonrheumatic mitral (valve) insufficiency: Secondary | ICD-10-CM | POA: Diagnosis not present

## 2020-09-18 DIAGNOSIS — I6523 Occlusion and stenosis of bilateral carotid arteries: Secondary | ICD-10-CM | POA: Diagnosis not present

## 2020-10-21 DIAGNOSIS — R739 Hyperglycemia, unspecified: Secondary | ICD-10-CM | POA: Diagnosis not present

## 2020-10-21 DIAGNOSIS — E782 Mixed hyperlipidemia: Secondary | ICD-10-CM | POA: Diagnosis not present

## 2020-10-21 DIAGNOSIS — M48061 Spinal stenosis, lumbar region without neurogenic claudication: Secondary | ICD-10-CM | POA: Diagnosis not present

## 2020-10-21 DIAGNOSIS — M1A9XX0 Chronic gout, unspecified, without tophus (tophi): Secondary | ICD-10-CM | POA: Diagnosis not present

## 2020-10-21 DIAGNOSIS — R600 Localized edema: Secondary | ICD-10-CM | POA: Diagnosis not present

## 2020-11-04 DIAGNOSIS — I6523 Occlusion and stenosis of bilateral carotid arteries: Secondary | ICD-10-CM | POA: Diagnosis not present

## 2020-11-04 DIAGNOSIS — I4819 Other persistent atrial fibrillation: Secondary | ICD-10-CM | POA: Diagnosis not present

## 2020-11-04 DIAGNOSIS — I77810 Thoracic aortic ectasia: Secondary | ICD-10-CM | POA: Diagnosis not present

## 2020-11-04 DIAGNOSIS — G4733 Obstructive sleep apnea (adult) (pediatric): Secondary | ICD-10-CM | POA: Diagnosis not present

## 2020-11-04 DIAGNOSIS — J8410 Pulmonary fibrosis, unspecified: Secondary | ICD-10-CM | POA: Diagnosis not present

## 2020-11-04 DIAGNOSIS — J449 Chronic obstructive pulmonary disease, unspecified: Secondary | ICD-10-CM | POA: Diagnosis not present

## 2020-11-04 DIAGNOSIS — D692 Other nonthrombocytopenic purpura: Secondary | ICD-10-CM | POA: Diagnosis not present

## 2020-11-04 DIAGNOSIS — I25810 Atherosclerosis of coronary artery bypass graft(s) without angina pectoris: Secondary | ICD-10-CM | POA: Diagnosis not present

## 2020-11-04 DIAGNOSIS — I10 Essential (primary) hypertension: Secondary | ICD-10-CM | POA: Diagnosis not present

## 2020-11-04 DIAGNOSIS — I739 Peripheral vascular disease, unspecified: Secondary | ICD-10-CM | POA: Diagnosis not present

## 2020-11-04 DIAGNOSIS — I340 Nonrheumatic mitral (valve) insufficiency: Secondary | ICD-10-CM | POA: Diagnosis not present

## 2020-11-04 DIAGNOSIS — I724 Aneurysm of artery of lower extremity: Secondary | ICD-10-CM | POA: Diagnosis not present

## 2021-01-06 DIAGNOSIS — J849 Interstitial pulmonary disease, unspecified: Secondary | ICD-10-CM | POA: Diagnosis not present

## 2021-01-06 DIAGNOSIS — Z01818 Encounter for other preprocedural examination: Secondary | ICD-10-CM | POA: Diagnosis not present

## 2021-01-06 DIAGNOSIS — J449 Chronic obstructive pulmonary disease, unspecified: Secondary | ICD-10-CM | POA: Diagnosis not present

## 2021-01-06 DIAGNOSIS — R0600 Dyspnea, unspecified: Secondary | ICD-10-CM | POA: Diagnosis not present

## 2021-01-06 DIAGNOSIS — Z9981 Dependence on supplemental oxygen: Secondary | ICD-10-CM | POA: Diagnosis not present

## 2021-01-27 DIAGNOSIS — G4733 Obstructive sleep apnea (adult) (pediatric): Secondary | ICD-10-CM | POA: Diagnosis not present

## 2021-01-28 DIAGNOSIS — G4733 Obstructive sleep apnea (adult) (pediatric): Secondary | ICD-10-CM | POA: Diagnosis not present

## 2021-03-18 DIAGNOSIS — I4819 Other persistent atrial fibrillation: Secondary | ICD-10-CM | POA: Diagnosis not present

## 2021-03-18 DIAGNOSIS — I6523 Occlusion and stenosis of bilateral carotid arteries: Secondary | ICD-10-CM | POA: Diagnosis not present

## 2021-03-18 DIAGNOSIS — J449 Chronic obstructive pulmonary disease, unspecified: Secondary | ICD-10-CM | POA: Diagnosis not present

## 2021-03-18 DIAGNOSIS — E782 Mixed hyperlipidemia: Secondary | ICD-10-CM | POA: Diagnosis not present

## 2021-03-18 DIAGNOSIS — I25810 Atherosclerosis of coronary artery bypass graft(s) without angina pectoris: Secondary | ICD-10-CM | POA: Diagnosis not present

## 2021-03-18 DIAGNOSIS — I77810 Thoracic aortic ectasia: Secondary | ICD-10-CM | POA: Diagnosis not present

## 2021-03-18 DIAGNOSIS — G4733 Obstructive sleep apnea (adult) (pediatric): Secondary | ICD-10-CM | POA: Diagnosis not present

## 2021-03-18 DIAGNOSIS — I10 Essential (primary) hypertension: Secondary | ICD-10-CM | POA: Diagnosis not present

## 2021-04-07 DIAGNOSIS — I517 Cardiomegaly: Secondary | ICD-10-CM | POA: Diagnosis not present

## 2021-04-07 DIAGNOSIS — I4819 Other persistent atrial fibrillation: Secondary | ICD-10-CM | POA: Diagnosis not present

## 2021-04-07 DIAGNOSIS — I700 Atherosclerosis of aorta: Secondary | ICD-10-CM | POA: Diagnosis not present

## 2021-04-07 DIAGNOSIS — R0781 Pleurodynia: Secondary | ICD-10-CM | POA: Diagnosis not present

## 2021-04-07 DIAGNOSIS — R918 Other nonspecific abnormal finding of lung field: Secondary | ICD-10-CM | POA: Diagnosis not present

## 2021-05-07 DIAGNOSIS — E782 Mixed hyperlipidemia: Secondary | ICD-10-CM | POA: Diagnosis not present

## 2021-05-07 DIAGNOSIS — I77810 Thoracic aortic ectasia: Secondary | ICD-10-CM | POA: Diagnosis not present

## 2021-05-07 DIAGNOSIS — R739 Hyperglycemia, unspecified: Secondary | ICD-10-CM | POA: Diagnosis not present

## 2021-05-07 DIAGNOSIS — I4819 Other persistent atrial fibrillation: Secondary | ICD-10-CM | POA: Diagnosis not present

## 2021-05-07 DIAGNOSIS — I25810 Atherosclerosis of coronary artery bypass graft(s) without angina pectoris: Secondary | ICD-10-CM | POA: Diagnosis not present

## 2021-05-12 DIAGNOSIS — I724 Aneurysm of artery of lower extremity: Secondary | ICD-10-CM | POA: Diagnosis not present

## 2021-05-12 DIAGNOSIS — G4733 Obstructive sleep apnea (adult) (pediatric): Secondary | ICD-10-CM | POA: Diagnosis not present

## 2021-05-12 DIAGNOSIS — D692 Other nonthrombocytopenic purpura: Secondary | ICD-10-CM | POA: Diagnosis not present

## 2021-05-12 DIAGNOSIS — I77810 Thoracic aortic ectasia: Secondary | ICD-10-CM | POA: Diagnosis not present

## 2021-05-12 DIAGNOSIS — I4819 Other persistent atrial fibrillation: Secondary | ICD-10-CM | POA: Diagnosis not present

## 2021-05-12 DIAGNOSIS — I25810 Atherosclerosis of coronary artery bypass graft(s) without angina pectoris: Secondary | ICD-10-CM | POA: Diagnosis not present

## 2021-05-12 DIAGNOSIS — J439 Emphysema, unspecified: Secondary | ICD-10-CM | POA: Diagnosis not present

## 2021-05-12 DIAGNOSIS — J8410 Pulmonary fibrosis, unspecified: Secondary | ICD-10-CM | POA: Diagnosis not present

## 2021-05-12 DIAGNOSIS — Z0000 Encounter for general adult medical examination without abnormal findings: Secondary | ICD-10-CM | POA: Diagnosis not present

## 2021-05-12 DIAGNOSIS — J449 Chronic obstructive pulmonary disease, unspecified: Secondary | ICD-10-CM | POA: Diagnosis not present

## 2021-05-12 DIAGNOSIS — J84112 Idiopathic pulmonary fibrosis: Secondary | ICD-10-CM | POA: Diagnosis not present

## 2021-05-12 DIAGNOSIS — I340 Nonrheumatic mitral (valve) insufficiency: Secondary | ICD-10-CM | POA: Diagnosis not present

## 2021-05-12 DIAGNOSIS — R0902 Hypoxemia: Secondary | ICD-10-CM | POA: Diagnosis not present

## 2021-05-12 DIAGNOSIS — I10 Essential (primary) hypertension: Secondary | ICD-10-CM | POA: Diagnosis not present

## 2021-05-12 DIAGNOSIS — R0600 Dyspnea, unspecified: Secondary | ICD-10-CM | POA: Diagnosis not present

## 2021-05-12 DIAGNOSIS — I739 Peripheral vascular disease, unspecified: Secondary | ICD-10-CM | POA: Diagnosis not present

## 2021-07-02 DIAGNOSIS — I340 Nonrheumatic mitral (valve) insufficiency: Secondary | ICD-10-CM | POA: Diagnosis not present

## 2021-07-02 DIAGNOSIS — J449 Chronic obstructive pulmonary disease, unspecified: Secondary | ICD-10-CM | POA: Diagnosis not present

## 2021-07-02 DIAGNOSIS — I10 Essential (primary) hypertension: Secondary | ICD-10-CM | POA: Diagnosis not present

## 2021-07-02 DIAGNOSIS — J8410 Pulmonary fibrosis, unspecified: Secondary | ICD-10-CM | POA: Diagnosis not present

## 2021-07-02 DIAGNOSIS — M1A9XX0 Chronic gout, unspecified, without tophus (tophi): Secondary | ICD-10-CM | POA: Diagnosis not present

## 2021-07-02 DIAGNOSIS — I724 Aneurysm of artery of lower extremity: Secondary | ICD-10-CM | POA: Diagnosis not present

## 2021-07-02 DIAGNOSIS — I739 Peripheral vascular disease, unspecified: Secondary | ICD-10-CM | POA: Diagnosis not present

## 2021-07-02 DIAGNOSIS — D692 Other nonthrombocytopenic purpura: Secondary | ICD-10-CM | POA: Diagnosis not present

## 2021-07-02 DIAGNOSIS — G4733 Obstructive sleep apnea (adult) (pediatric): Secondary | ICD-10-CM | POA: Diagnosis not present

## 2021-07-02 DIAGNOSIS — I25810 Atherosclerosis of coronary artery bypass graft(s) without angina pectoris: Secondary | ICD-10-CM | POA: Diagnosis not present

## 2021-07-02 DIAGNOSIS — I4819 Other persistent atrial fibrillation: Secondary | ICD-10-CM | POA: Diagnosis not present

## 2021-07-02 DIAGNOSIS — I77810 Thoracic aortic ectasia: Secondary | ICD-10-CM | POA: Diagnosis not present

## 2021-07-15 DIAGNOSIS — H34812 Central retinal vein occlusion, left eye, with macular edema: Secondary | ICD-10-CM | POA: Diagnosis not present

## 2021-08-14 ENCOUNTER — Ambulatory Visit (INDEPENDENT_AMBULATORY_CARE_PROVIDER_SITE_OTHER): Payer: PPO

## 2021-08-14 ENCOUNTER — Other Ambulatory Visit: Payer: Self-pay

## 2021-08-14 ENCOUNTER — Encounter (INDEPENDENT_AMBULATORY_CARE_PROVIDER_SITE_OTHER): Payer: Self-pay | Admitting: Vascular Surgery

## 2021-08-14 ENCOUNTER — Ambulatory Visit (INDEPENDENT_AMBULATORY_CARE_PROVIDER_SITE_OTHER): Payer: PPO | Admitting: Vascular Surgery

## 2021-08-14 VITALS — BP 159/85 | HR 79 | Resp 16 | Wt 235.0 lb

## 2021-08-14 DIAGNOSIS — I724 Aneurysm of artery of lower extremity: Secondary | ICD-10-CM

## 2021-08-14 DIAGNOSIS — J449 Chronic obstructive pulmonary disease, unspecified: Secondary | ICD-10-CM

## 2021-08-14 DIAGNOSIS — I739 Peripheral vascular disease, unspecified: Secondary | ICD-10-CM | POA: Diagnosis not present

## 2021-08-14 DIAGNOSIS — I10 Essential (primary) hypertension: Secondary | ICD-10-CM | POA: Diagnosis not present

## 2021-08-14 DIAGNOSIS — I6523 Occlusion and stenosis of bilateral carotid arteries: Secondary | ICD-10-CM | POA: Diagnosis not present

## 2021-08-14 DIAGNOSIS — I25118 Atherosclerotic heart disease of native coronary artery with other forms of angina pectoris: Secondary | ICD-10-CM | POA: Diagnosis not present

## 2021-08-17 ENCOUNTER — Encounter (INDEPENDENT_AMBULATORY_CARE_PROVIDER_SITE_OTHER): Payer: Self-pay | Admitting: Vascular Surgery

## 2021-08-17 NOTE — Progress Notes (Signed)
MRN : [id number]  [name]. is a 80 y.o. (1941-08-16) male who presents with chief complaint of check legs.  History of Present Illness: The patient returns to the office for followup and review of the noninvasive studies. There have been no interval changes in lower extremity symptoms. No interval shortening of the patient's claudication distance or development of rest pain symptoms. No new ulcers or wounds have occurred since the last visit.  There have been no significant changes to the patient's overall health care.  The patient denies amaurosis fugax or recent TIA symptoms. There are no recent neurological changes noted. The patient denies history of DVT, PE or superficial thrombophlebitis. The patient denies recent episodes of angina or shortness of breath.   Duplex ultrasound of the carotid arteries shows 1-39% bilateral carotid stenosis  Duplex ultrasound of the popliteal arteries Rt=1.3 cm and Lt=0.93   Current Meds  Medication Sig   acetaminophen (TYLENOL) 650 MG CR tablet Take 1,300 mg by mouth every 8 (eight) hours as needed for pain.    albuterol (VENTOLIN HFA) 108 (90 Base) MCG/ACT inhaler Inhale 1-2 puffs into the lungs every 6 (six) hours as needed for wheezing or shortness of breath.   ALPRAZolam (XANAX) 0.25 MG tablet Take 0.25 mg by mouth 3 (three) times daily as needed for anxiety.   amiodarone (PACERONE) 200 MG tablet Take 200 mg by mouth daily.    benazepril (LOTENSIN) 40 MG tablet Take 40 mg by mouth daily.    diltiazem (CARDIZEM CD) 120 MG 24 hr capsule Take by mouth.   ELIQUIS 5 MG TABS tablet Take 5 mg by mouth 2 (two) times daily.    etodolac (LODINE) 500 MG tablet TAKE 1 TABLET BY MOUTH TWICE DAILY AS NEEDED FOR GOUT PAIN   furosemide (LASIX) 40 MG tablet TAKE 40 MG BY MOUTH ONCE DAILY AS NEEDED FOR EDEMA   Multiple Vitamin (MULTIVITAMIN) tablet Take 1 tablet by mouth daily.   Naphazoline HCl (CLEAR EYES OP) Place 1 drop into both eyes daily as  needed (for dry eyes).   Pirfenidone 801 MG TABS Take by mouth.    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[place])    Sleep apnea    CPAP   Tremor     Past Surgical History:  Procedure Laterality Date   CARDIAC SURGERY     CARDIOVERSION N/A 03/03/2018   Procedure: CARDIOVERSION;  Surgeon: [name], MD;  Location: [facility];  Service: Cardiovascular;  Laterality: N/A;   CARDIOVERSION N/A 05/24/2018   Procedure: CARDIOVERSION;  Surgeon: [name], MD;  Location: [facility];  Service: Cardiovascular;  Laterality: N/A;   CATARACT EXTRACTION W/PHACO Right 06/03/2020   Procedure: CATARACT EXTRACTION PHACO AND INTRAOCULAR LENS PLACEMENT ([place]) RIGHT 6.07  00:48.0;  Surgeon: Eulogio Bear, MD;  Location: [facility];  Service: Ophthalmology;  Laterality: Right;  sleep apnea   CORONARY ARTERY BYPASS GRAFT     EYE SURGERY     LEG SURGERY Left    stents placed, Fem-Fem bypass   TONSILLECTOMY      Social History Social History   Tobacco Use   Smoking status: Former    Types: Cigarettes    Quit date: 02/16/1979    Years since quitting: 41.5  Smokeless tobacco: Never  Vaping Use   Vaping Use: Never used  Substance Use Topics   Alcohol use: Yes    Comment: rarely   Drug use: No    Family History Family History  Problem Relation Age of Onset   Thyroid disease Mother    Macular degeneration Mother    Cancer Father     Allergies  Allergen Reactions   Fish Allergy Hives   Tramadol Other (See Comments)    confusion   Hydrochlorothiazide Other (See Comments)    worsens gout   Iodine Rash   Shellfish Allergy Rash     REVIEW OF SYSTEMS (Negative unless checked)  Constitutional: _0 Weight loss  _1 Fever  _2 Chills Cardiac: _3 Chest pain   _4 Chest pressure   _5 Palpitations   _6 Shortness of  breath when laying flat   _7 Shortness of breath with exertion. Vascular:  _8 Pain in legs with walking   _9 Pain in legs at rest  _10 History of DVT   _11 Phlebitis   _12 Swelling in legs   _13 Varicose veins   _14 Non-healing ulcers Pulmonary:   _15 Uses home oxygen   _16 Productive cough   _17 Hemoptysis   _18 Wheeze  _19 COPD   _20 Asthma Neurologic:  _21 Dizziness   _22 Seizures   _23 History of stroke   _24 History of TIA  _25 Aphasia   _26 Vissual changes   _27 Weakness or numbness in arm   _28 Weakness or numbness in leg Musculoskeletal:   _29 Joint swelling   _30 Joint pain   _31 Low back pain Hematologic:  _32 Easy bruising  _33 Easy bleeding   _34 Hypercoagulable state   _35 Anemic Gastrointestinal:  _36 Diarrhea   _37 Vomiting  _38 Gastroesophageal reflux/heartburn   _39 Difficulty swallowing. Genitourinary:  _40 Chronic kidney disease   _41 Difficult urination  _42 Frequent urination   _43 Blood in urine Skin:  _44 Rashes   _45 Ulcers  Psychological:  _46 History of anxiety   _47  History of major depression.  Physical Examination  Vitals:   08/14/21 1549  BP: (!) 159/85  Pulse: 79  Resp: 16  Weight: 235 lb (106.6 kg)   Body mass index is 34.7 kg/m. Gen: WD/WN, NAD Head: [place]/AT, No temporalis wasting.  Ear/Nose/Throat: Hearing grossly intact, nares w/o erythema or drainage Eyes: PER, EOMI, sclera nonicteric.  Neck: Supple, no masses.  No bruit or JVD.  Pulmonary:  Good air movement, no audible wheezing, no use of accessory muscles.  Cardiac: RRR, normal S1, S2, no Murmurs. Vascular:  carotid bruit Vessel Right Left  Radial Palpable Palpable  Carotid Palpable Palpable  PT Palpable Palpable  DP Palpable Palpable  Gastrointestinal: soft, non-distended. No guarding/no peritoneal signs.  Musculoskeletal: M/S 5/5 throughout.  No visible deformity.  Neurologic: CN 2-12 intact. Pain and light touch intact in extremities.  Symmetrical.  Speech is fluent. Motor exam as listed above. Psychiatric: Judgment intact, Mood & affect appropriate for pt's  clinical situation. Dermatologic: No rashes or ulcers noted.  No changes consistent with cellulitis.   CBC Lab Results  Component Value Date   WBC 9.5 09/09/2015   HGB 13.1 09/09/2015   HCT 39.5 (L) 09/09/2015   MCV 86.3 09/09/2015   PLT 181 09/09/2015    BMET    Component Value Date/Time   NA 138 01/28/2012 0550   K 4.4 01/28/2012 0550   CL 103 01/28/2012 0550   CO2 26 01/28/2012 0550   GLUCOSE 110 (H) 01/28/2012 0550   BUN 19 (H) 01/28/2012 0550   CREATININE 0.85 01/28/2012 0550   CALCIUM 8.7 01/28/2012 0550   GFRNONAA >60 01/28/2012 0550   GFRAA >60 01/28/2012 0550   CrCl  cannot be calculated (Patient's most recent lab result is older than the maximum 21 days allowed.).  COAG Lab Results  Component Value Date   INR 1.45 09/09/2015   INR 1.9 01/29/2012   INR 1.7 01/28/2012    Radiology VAS [place] ABI WITH/WO TBI  Result Date: 08/14/2021  LOWER EXTREMITY DOPPLER STUDY Patient Name:  Collin RHEW JR.  Date of Exam:   08/14/2021 Medical Rec #: [id number]               Accession #:    [id number] Date of Birth: [birth year]                Patient Gender: M Patient Age:   66 years Exam Location:  [facility] Procedure:      VAS [place] ABI WITH/WO TBI Referring Phys: Hortencia Pilar --------------------------------------------------------------------------------   Comparison Study: 08/15/2018 Performing Technologist: Charlane Ferretti RT (R)(VS)  Examination Guidelines: A complete evaluation includes at minimum, Doppler waveform signals and systolic blood pressure reading at the level of bilateral brachial, anterior tibial, and posterior tibial arteries, when vessel segments are accessible. Bilateral testing is considered an integral part of a complete examination. Photoelectric Plethysmograph (PPG) waveforms and toe systolic pressure readings are included as required and additional duplex testing as needed. Limited examinations for reoccurring indications may be performed as  noted.  ABI Findings: +---------+------------------+-----+--------+--------+ Right    Rt Pressure (mmHg)IndexWaveformComment  +---------+------------------+-----+--------+--------+ Brachial 176                                     +---------+------------------+-----+--------+--------+ ATA      214                    biphasic         +---------+------------------+-----+--------+--------+ PTA      181               1.03 biphasic         +---------+------------------+-----+--------+--------+ Great Toe191               1.09 Normal           +---------+------------------+-----+--------+--------+ +---------+------------------+-----+--------+-------+ Left     Lt Pressure (mmHg)IndexWaveformComment +---------+------------------+-----+--------+-------+ Brachial 166                                    +---------+------------------+-----+--------+-------+ ATA      181                    biphasic        +---------+------------------+-----+--------+-------+ PTA      204               1.16 biphasic        +---------+------------------+-----+--------+-------+ Great Toe135               0.77 Dampened        +---------+------------------+-----+--------+-------+ +-------+-----------+-----------+------------+------------+ ABI/TBIToday's ABIToday's TBIPrevious ABIPrevious TBI +-------+-----------+-----------+------------+------------+ Right  1.22       1.09       .95         1.27         +-------+-----------+-----------+------------+------------+ Left   1.16       .77        .99         1.23         +-------+-----------+-----------+------------+------------+  Bilateral ABIs appear essentially unchanged compared to prior study on 08/15/2018. Bilateral TBIs appear essentially unchanged compared to prior study on 08/15/2018.  Summary: Right: Resting right ankle-brachial index is within normal range. No evidence of significant right lower extremity arterial disease. The  right toe-brachial index is normal. Left: Resting left ankle-brachial index is within normal range. No evidence of significant left lower extremity arterial disease. The left toe-brachial index is normal.  *See table(s) above for measurements and observations.  Electronically signed by Hortencia Pilar MD on 08/14/2021 at 5:01:12 PM.    Final    VAS US CAROTID  Result Date: 08/14/2021 Carotid Arterial Duplex Study Patient Name:  [name].  Date of Exam:   08/14/2021 Medical Rec #: [id number]               Accession #:    [id number] Date of Birth: 1941/03/27                Patient Gender: M Patient Age:   39 years Exam Location:  [facility] Procedure:      VAS US CAROTID Referring Phys: Hortencia Pilar --------------------------------------------------------------------------------  Comparison Study:  08/15/2020 Performing Technologist: Charlane Ferretti RT (R)(VS)  Examination Guidelines: A complete evaluation includes B-mode imaging, spectral Doppler, color Doppler, and power Doppler as needed of all accessible portions of each vessel. Bilateral testing is considered an integral part of a complete examination. Limited examinations for reoccurring indications may be performed as noted.  Right Carotid Findings: +----------+--------+--------+--------+------------------+--------------------+           PSV cm/sEDV cm/sStenosisPlaque DescriptionComments             +----------+--------+--------+--------+------------------+--------------------+ CCA Prox  54      10                                                     +----------+--------+--------+--------+------------------+--------------------+ CCA Mid   47      10                                                     +----------+--------+--------+--------+------------------+--------------------+ CCA Distal42      11                                                      +----------+--------+--------+--------+------------------+--------------------+ ICA Prox  36      12                                ICA/CCA ratio = 1.60 +----------+--------+--------+--------+------------------+--------------------+ ICA Mid   74      21                                                     +----------+--------+--------+--------+------------------+--------------------+ ICA Distal68      16                                                     +----------+--------+--------+--------+------------------+--------------------+  ECA       90      9                                 tortuous             +----------+--------+--------+--------+------------------+--------------------+ +----------+--------+-------+-----------+-------------------+           PSV cm/sEDV cmsDescribe   Arm Pressure (mmHG) +----------+--------+-------+-----------+-------------------+ Subclavian120            Multiphasic                    +----------+--------+-------+-----------+-------------------+ +---------+--------+--+--------+-+---------+ VertebralPSV cm/s22EDV cm/s5Antegrade +---------+--------+--+--------+-+---------+ Left Carotid Findings: +----------+--------+--------+--------+------------------+--------------------+           PSV cm/sEDV cm/sStenosisPlaque DescriptionComments             +----------+--------+--------+--------+------------------+--------------------+ CCA Prox  63      10                                                     +----------+--------+--------+--------+------------------+--------------------+ CCA Mid   74      14                                intimal thickening   +----------+--------+--------+--------+------------------+--------------------+ CCA Distal42      4                                 intimal thickening   +----------+--------+--------+--------+------------------+--------------------+ ICA Prox  53      12                                 ICA/CCA ratio = 1.14 +----------+--------+--------+--------+------------------+--------------------+ ICA Mid   84      22                                                     +----------+--------+--------+--------+------------------+--------------------+ ICA Distal61      19                                tortuous             +----------+--------+--------+--------+------------------+--------------------+ ECA       80      22                                                     +----------+--------+--------+--------+------------------+--------------------+ +----------+--------+--------+-----------+-------------------+           PSV cm/sEDV cm/sDescribe   Arm Pressure (mmHG) +----------+--------+--------+-----------+-------------------+ [id number]             Multiphasic                    +----------+--------+--------+-----------+-------------------+ +---------+--------+--+--------+--+---------+ VertebralPSV cm/s36EDV cm/s10Antegrade +---------+--------+--+--------+--+---------+  Summary: Right Carotid: Velocities in  the right ICA are consistent with a 1-39% stenosis. Left Carotid: Velocities in the left ICA are consistent with a 1-39% stenosis. Vertebrals:  Bilateral vertebral arteries demonstrate antegrade flow. Subclavians: Normal flow hemodynamics were seen in bilateral subclavian              arteries. *See table(s) above for measurements and observations.  Electronically signed by Hortencia Pilar MD on 08/14/2021 at 5:03:53 PM.    Final    VAS [place] LOWER EXTREMITY ARTERIAL DUPLEX  Result Date: 08/14/2021 LOWER EXTREMITY ARTERIAL DUPLEX STUDY Patient Name:  [name].  Date of Exam:   08/14/2021 Medical Rec #: [id number]               Accession #:    [id number] Date of Birth: 01-Nov-1941                Patient Gender: M Patient Age:   45 years Exam Location:  [facility] Procedure:      VAS [place] LOWER EXTREMITY ARTERIAL DUPLEX Referring  Phys: [facility] --------------------------------------------------------------------------------  Indications: Popliteal artery aneurysm right.  Vascular Interventions: Stents left LEA. Current ABI:            Rt = 1.22 Lt = 1.16 Comparison Study: 08/15/2020 Performing Technologist: Charlane Ferretti RT (R)(VS)  Examination Guidelines: A complete evaluation includes B-mode imaging, spectral Doppler, color Doppler, and power Doppler as needed of all accessible portions of each vessel. Bilateral testing is considered an integral part of a complete examination. Limited examinations for reoccurring indications may be performed as noted.  +----------+--------+-----+--------+--------+--------+ RIGHT     PSV cm/sRatioStenosisWaveformComments +----------+--------+-----+--------+--------+--------+ POP Prox  87                                    +----------+--------+-----+--------+--------+--------+ POP Distal62                                    +----------+--------+-----+--------+--------+--------+ +---------------+-------+-----------+----------+--------+-----+--------+ Right PoplitealAP (cm)Transv (cm)Waveform  StenosisShapeComments +---------------+-------+-----------+----------+--------+-----+--------+ Proximal       1.19   0.74       biphasic                        +---------------+-------+-----------+----------+--------+-----+--------+ Mid            1.31   1.26       monophasic                      +---------------+-------+-----------+----------+--------+-----+--------+ Distal         0.96   0.87       biphasic                        +---------------+-------+-----------+----------+--------+-----+--------+ +--------------+-------+-----------+--------+--------+-----+--------+ Left PoplitealAP (cm)Transv (cm)WaveformStenosisShapeComments +--------------+-------+-----------+--------+--------+-----+--------+ Proximal      0.74   0.72       biphasic                       +--------------+-------+-----------+--------+--------+-----+--------+ Mid           0.93   0.84       biphasic                      +--------------+-------+-----------+--------+--------+-----+--------+ Distal        0.75   0.89  biphasic                      +--------------+-------+-----------+--------+--------+-----+--------+  +----------+--------+-----+--------+--------+--------+ LEFT      PSV cm/sRatioStenosisWaveformComments +----------+--------+-----+--------+--------+--------+ POP Prox  127                                   +----------+--------+-----+--------+--------+--------+ POP Distal83                                    +----------+--------+-----+--------+--------+--------+  Summary: Right: Mild dilitation of the right proximal popliteal artery with no change as compared to the previous exam on 08/15/2020. Largert diameter is 1.31cm. Left: Normal left popliteal artery no pseudoaneurysm noted as compared to the previous exam on 08/07/2020. Largest diameter is .93cm.  See table(s) above for measurements and observations. Electronically signed by Hortencia Pilar MD on 08/14/2021 at 5:01:53 PM.    Final      Assessment/Plan 1. Peripheral vascular disease ([place])  Recommend:  The patient has evidence of atherosclerosis of the lower extremities with claudication.  The patient does not voice lifestyle limiting changes at this point in time.  Noninvasive studies do not suggest clinically significant change.  No invasive studies, angiography or surgery at this time The patient should continue walking and begin a more formal exercise program.  The patient should continue antiplatelet therapy and aggressive treatment of the lipid abnormalities  No changes in the patient's medications at this time  The patient should continue wearing graduated compression socks 10-15 mmHg strength to control the mild edema.   - VAS [place] ABI WITH/WO TBI; Future  2.  Popliteal artery aneurysm (HCC) No surgery or intervention at this time.  The patient has an asymptomatic popliteal artery aneurysm that is less than 2.5 cm in maximal diameter.  I have discussed the natural history of popliteal aneurysm and the small risk of thrombosis for aneurysm less than 2.5 cm in size.  However, as these small aneurysms tend to enlarge over time, continued surveillance with ultrasound is mandatory.   I have also discussed optimizing medical management with hypertension and lipid control and the importance of abstinence from tobacco.  The patient is also encouraged to exercise a minimum of 30 minutes 4 times a week.   Should the patient develop new leg pain or signs of peripheral embolization they are instructed to seek medical attention immediately and to alert the physician providing care that they have an aneurysm.  The patient voices their understanding.  - VAS [place] LOWER EXTREMITY ARTERIAL DUPLEX; Future - VAS US AORTA/IVC/ILIACS; Future  3. Bilateral carotid artery stenosis Recommend:  Given the patient's asymptomatic subcritical stenosis no further invasive testing or surgery at this time.  Duplex ultrasound of the carotid arteries shows 1-39% bilateral carotid stenosis  Continue antiplatelet therapy as prescribed Continue management of CAD, HTN and Hyperlipidemia Healthy heart diet,  encouraged exercise at least 4 times per week Follow up in 24 months with duplex ultrasound and physical exam    4. Coronary artery disease of native artery of native heart with stable angina pectoris (HCC) Continue cardiac and antihypertensive medications as already ordered and reviewed, no changes at this time.  Continue statin as ordered and reviewed, no changes at this time  Nitrates PRN for chest pain   5. Benign essential hypertension Continue antihypertensive medications as already ordered,  these medications have been reviewed and there are no changes at this time.    6. Chronic obstructive pulmonary disease, unspecified COPD type ([place]) Continue pulmonary medications and aerosols as already ordered, these medications have been reviewed and there are no changes at this time.      Hortencia Pilar, MD  08/17/2021 3:15 PM

## 2021-08-25 DIAGNOSIS — R739 Hyperglycemia, unspecified: Secondary | ICD-10-CM | POA: Diagnosis not present

## 2021-08-25 DIAGNOSIS — E782 Mixed hyperlipidemia: Secondary | ICD-10-CM | POA: Diagnosis not present

## 2021-08-25 DIAGNOSIS — I10 Essential (primary) hypertension: Secondary | ICD-10-CM | POA: Diagnosis not present

## 2021-08-25 DIAGNOSIS — Z79899 Other long term (current) drug therapy: Secondary | ICD-10-CM | POA: Diagnosis not present

## 2021-08-27 DIAGNOSIS — G4733 Obstructive sleep apnea (adult) (pediatric): Secondary | ICD-10-CM | POA: Diagnosis not present

## 2021-09-25 DIAGNOSIS — I10 Essential (primary) hypertension: Secondary | ICD-10-CM | POA: Diagnosis not present

## 2021-09-25 DIAGNOSIS — I340 Nonrheumatic mitral (valve) insufficiency: Secondary | ICD-10-CM | POA: Diagnosis not present

## 2021-09-25 DIAGNOSIS — E782 Mixed hyperlipidemia: Secondary | ICD-10-CM | POA: Diagnosis not present

## 2021-09-25 DIAGNOSIS — I25810 Atherosclerosis of coronary artery bypass graft(s) without angina pectoris: Secondary | ICD-10-CM | POA: Diagnosis not present

## 2021-09-25 DIAGNOSIS — I77810 Thoracic aortic ectasia: Secondary | ICD-10-CM | POA: Diagnosis not present

## 2021-09-25 DIAGNOSIS — I4819 Other persistent atrial fibrillation: Secondary | ICD-10-CM | POA: Diagnosis not present

## 2021-09-25 DIAGNOSIS — I724 Aneurysm of artery of lower extremity: Secondary | ICD-10-CM | POA: Diagnosis not present

## 2021-09-25 DIAGNOSIS — I739 Peripheral vascular disease, unspecified: Secondary | ICD-10-CM | POA: Diagnosis not present

## 2021-09-25 DIAGNOSIS — D6869 Other thrombophilia: Secondary | ICD-10-CM | POA: Diagnosis not present

## 2021-10-01 DIAGNOSIS — I724 Aneurysm of artery of lower extremity: Secondary | ICD-10-CM | POA: Diagnosis not present

## 2021-10-01 DIAGNOSIS — J449 Chronic obstructive pulmonary disease, unspecified: Secondary | ICD-10-CM | POA: Diagnosis not present

## 2021-10-01 DIAGNOSIS — D6869 Other thrombophilia: Secondary | ICD-10-CM | POA: Diagnosis not present

## 2021-10-01 DIAGNOSIS — I77810 Thoracic aortic ectasia: Secondary | ICD-10-CM | POA: Diagnosis not present

## 2021-10-01 DIAGNOSIS — E782 Mixed hyperlipidemia: Secondary | ICD-10-CM | POA: Diagnosis not present

## 2021-10-01 DIAGNOSIS — R739 Hyperglycemia, unspecified: Secondary | ICD-10-CM | POA: Diagnosis not present

## 2021-10-01 DIAGNOSIS — M1A9XX0 Chronic gout, unspecified, without tophus (tophi): Secondary | ICD-10-CM | POA: Diagnosis not present

## 2021-10-01 DIAGNOSIS — I10 Essential (primary) hypertension: Secondary | ICD-10-CM | POA: Diagnosis not present

## 2021-10-01 DIAGNOSIS — I25810 Atherosclerosis of coronary artery bypass graft(s) without angina pectoris: Secondary | ICD-10-CM | POA: Diagnosis not present

## 2021-10-01 DIAGNOSIS — I4819 Other persistent atrial fibrillation: Secondary | ICD-10-CM | POA: Diagnosis not present

## 2021-10-01 DIAGNOSIS — I739 Peripheral vascular disease, unspecified: Secondary | ICD-10-CM | POA: Diagnosis not present

## 2021-10-01 DIAGNOSIS — J8410 Pulmonary fibrosis, unspecified: Secondary | ICD-10-CM | POA: Diagnosis not present

## 2021-10-21 DIAGNOSIS — M4690 Unspecified inflammatory spondylopathy, site unspecified: Secondary | ICD-10-CM | POA: Diagnosis not present

## 2021-10-21 DIAGNOSIS — Z7901 Long term (current) use of anticoagulants: Secondary | ICD-10-CM | POA: Diagnosis not present

## 2021-10-21 DIAGNOSIS — I10 Essential (primary) hypertension: Secondary | ICD-10-CM | POA: Diagnosis not present

## 2021-10-21 DIAGNOSIS — I4819 Other persistent atrial fibrillation: Secondary | ICD-10-CM | POA: Diagnosis not present

## 2021-10-28 DIAGNOSIS — E039 Hypothyroidism, unspecified: Secondary | ICD-10-CM | POA: Diagnosis not present

## 2021-11-06 DIAGNOSIS — R059 Cough, unspecified: Secondary | ICD-10-CM | POA: Diagnosis not present

## 2021-11-06 DIAGNOSIS — R042 Hemoptysis: Secondary | ICD-10-CM | POA: Diagnosis not present

## 2021-11-06 DIAGNOSIS — J849 Interstitial pulmonary disease, unspecified: Secondary | ICD-10-CM | POA: Diagnosis not present

## 2021-11-06 DIAGNOSIS — R058 Other specified cough: Secondary | ICD-10-CM | POA: Diagnosis not present

## 2021-11-07 ENCOUNTER — Other Ambulatory Visit: Payer: Self-pay | Admitting: Specialist

## 2021-11-07 DIAGNOSIS — R058 Other specified cough: Secondary | ICD-10-CM

## 2021-11-07 DIAGNOSIS — J849 Interstitial pulmonary disease, unspecified: Secondary | ICD-10-CM

## 2021-11-13 ENCOUNTER — Ambulatory Visit
Admission: RE | Admit: 2021-11-13 | Discharge: 2021-11-13 | Disposition: A | Discharge status: Home / Self Care | Payer: PPO | Source: Ambulatory Visit | Attending: Specialist | Admitting: Specialist

## 2021-11-13 ENCOUNTER — Other Ambulatory Visit: Payer: Self-pay

## 2021-11-13 DIAGNOSIS — R058 Other specified cough: Secondary | ICD-10-CM | POA: Insufficient documentation

## 2021-11-13 DIAGNOSIS — J849 Interstitial pulmonary disease, unspecified: Secondary | ICD-10-CM | POA: Diagnosis not present

## 2021-11-13 DIAGNOSIS — I700 Atherosclerosis of aorta: Secondary | ICD-10-CM | POA: Diagnosis not present

## 2021-11-13 DIAGNOSIS — R0602 Shortness of breath: Secondary | ICD-10-CM | POA: Diagnosis not present

## 2021-12-03 DIAGNOSIS — G4733 Obstructive sleep apnea (adult) (pediatric): Secondary | ICD-10-CM | POA: Diagnosis not present

## 2021-12-03 DIAGNOSIS — Z01818 Encounter for other preprocedural examination: Secondary | ICD-10-CM | POA: Diagnosis not present

## 2021-12-03 DIAGNOSIS — R0609 Other forms of dyspnea: Secondary | ICD-10-CM | POA: Diagnosis not present

## 2021-12-03 DIAGNOSIS — J449 Chronic obstructive pulmonary disease, unspecified: Secondary | ICD-10-CM | POA: Diagnosis not present

## 2021-12-03 DIAGNOSIS — J84112 Idiopathic pulmonary fibrosis: Secondary | ICD-10-CM | POA: Diagnosis not present

## 2021-12-23 DIAGNOSIS — D485 Neoplasm of uncertain behavior of skin: Secondary | ICD-10-CM | POA: Diagnosis not present

## 2021-12-23 DIAGNOSIS — L578 Other skin changes due to chronic exposure to nonionizing radiation: Secondary | ICD-10-CM | POA: Diagnosis not present

## 2021-12-23 DIAGNOSIS — L821 Other seborrheic keratosis: Secondary | ICD-10-CM | POA: Diagnosis not present

## 2021-12-25 DIAGNOSIS — I77810 Thoracic aortic ectasia: Secondary | ICD-10-CM | POA: Diagnosis not present

## 2021-12-25 DIAGNOSIS — E782 Mixed hyperlipidemia: Secondary | ICD-10-CM | POA: Diagnosis not present

## 2021-12-25 DIAGNOSIS — E039 Hypothyroidism, unspecified: Secondary | ICD-10-CM | POA: Diagnosis not present

## 2022-01-05 DIAGNOSIS — I10 Essential (primary) hypertension: Secondary | ICD-10-CM | POA: Diagnosis not present

## 2022-01-05 DIAGNOSIS — I77810 Thoracic aortic ectasia: Secondary | ICD-10-CM | POA: Diagnosis not present

## 2022-01-05 DIAGNOSIS — D6869 Other thrombophilia: Secondary | ICD-10-CM | POA: Diagnosis not present

## 2022-01-05 DIAGNOSIS — R600 Localized edema: Secondary | ICD-10-CM | POA: Diagnosis not present

## 2022-01-05 DIAGNOSIS — I739 Peripheral vascular disease, unspecified: Secondary | ICD-10-CM | POA: Diagnosis not present

## 2022-01-05 DIAGNOSIS — M48061 Spinal stenosis, lumbar region without neurogenic claudication: Secondary | ICD-10-CM | POA: Diagnosis not present

## 2022-01-05 DIAGNOSIS — J449 Chronic obstructive pulmonary disease, unspecified: Secondary | ICD-10-CM | POA: Diagnosis not present

## 2022-01-05 DIAGNOSIS — I724 Aneurysm of artery of lower extremity: Secondary | ICD-10-CM | POA: Diagnosis not present

## 2022-01-05 DIAGNOSIS — J8410 Pulmonary fibrosis, unspecified: Secondary | ICD-10-CM | POA: Diagnosis not present

## 2022-01-05 DIAGNOSIS — D692 Other nonthrombocytopenic purpura: Secondary | ICD-10-CM | POA: Diagnosis not present

## 2022-01-05 DIAGNOSIS — I4819 Other persistent atrial fibrillation: Secondary | ICD-10-CM | POA: Diagnosis not present

## 2022-01-05 DIAGNOSIS — I25810 Atherosclerosis of coronary artery bypass graft(s) without angina pectoris: Secondary | ICD-10-CM | POA: Diagnosis not present

## 2022-01-16 DIAGNOSIS — J069 Acute upper respiratory infection, unspecified: Secondary | ICD-10-CM | POA: Diagnosis not present

## 2022-01-16 DIAGNOSIS — I4819 Other persistent atrial fibrillation: Secondary | ICD-10-CM | POA: Diagnosis not present

## 2022-01-16 DIAGNOSIS — J8410 Pulmonary fibrosis, unspecified: Secondary | ICD-10-CM | POA: Diagnosis not present

## 2022-01-16 DIAGNOSIS — I739 Peripheral vascular disease, unspecified: Secondary | ICD-10-CM | POA: Diagnosis not present

## 2022-01-16 DIAGNOSIS — J449 Chronic obstructive pulmonary disease, unspecified: Secondary | ICD-10-CM | POA: Diagnosis not present

## 2022-01-16 DIAGNOSIS — D6869 Other thrombophilia: Secondary | ICD-10-CM | POA: Diagnosis not present

## 2022-01-23 DIAGNOSIS — I272 Pulmonary hypertension, unspecified: Secondary | ICD-10-CM | POA: Diagnosis not present

## 2022-01-28 DIAGNOSIS — I4819 Other persistent atrial fibrillation: Secondary | ICD-10-CM | POA: Diagnosis not present

## 2022-01-28 DIAGNOSIS — I25708 Atherosclerosis of coronary artery bypass graft(s), unspecified, with other forms of angina pectoris: Secondary | ICD-10-CM | POA: Diagnosis not present

## 2022-01-28 DIAGNOSIS — I6523 Occlusion and stenosis of bilateral carotid arteries: Secondary | ICD-10-CM | POA: Diagnosis not present

## 2022-01-28 DIAGNOSIS — Z01818 Encounter for other preprocedural examination: Secondary | ICD-10-CM | POA: Diagnosis not present

## 2022-01-28 DIAGNOSIS — I10 Essential (primary) hypertension: Secondary | ICD-10-CM | POA: Diagnosis not present

## 2022-02-03 DIAGNOSIS — I209 Angina pectoris, unspecified: Secondary | ICD-10-CM | POA: Diagnosis present

## 2022-02-12 ENCOUNTER — Ambulatory Visit
Admission: RE | Admit: 2022-02-12 | Discharge: 2022-02-12 | Disposition: A | Discharge status: Home / Self Care | Payer: PPO | Attending: Internal Medicine | Admitting: Internal Medicine

## 2022-02-12 ENCOUNTER — Other Ambulatory Visit: Payer: Self-pay

## 2022-02-12 ENCOUNTER — Encounter: Payer: Self-pay | Admitting: Internal Medicine

## 2022-02-12 ENCOUNTER — Encounter
Admission: RE | Disposition: A | Discharge status: Home / Self Care | Payer: Self-pay | Source: Home / Self Care | Attending: Internal Medicine

## 2022-02-12 DIAGNOSIS — I25790 Atherosclerosis of other coronary artery bypass graft(s) with unstable angina pectoris: Secondary | ICD-10-CM | POA: Diagnosis not present

## 2022-02-12 DIAGNOSIS — I25118 Atherosclerotic heart disease of native coronary artery with other forms of angina pectoris: Secondary | ICD-10-CM | POA: Insufficient documentation

## 2022-02-12 DIAGNOSIS — R0602 Shortness of breath: Secondary | ICD-10-CM | POA: Diagnosis not present

## 2022-02-12 DIAGNOSIS — I2582 Chronic total occlusion of coronary artery: Secondary | ICD-10-CM | POA: Diagnosis not present

## 2022-02-12 DIAGNOSIS — I10 Essential (primary) hypertension: Secondary | ICD-10-CM | POA: Insufficient documentation

## 2022-02-12 DIAGNOSIS — I272 Pulmonary hypertension, unspecified: Secondary | ICD-10-CM | POA: Insufficient documentation

## 2022-02-12 DIAGNOSIS — I208 Other forms of angina pectoris: Secondary | ICD-10-CM

## 2022-02-12 DIAGNOSIS — I209 Angina pectoris, unspecified: Secondary | ICD-10-CM | POA: Diagnosis present

## 2022-02-12 HISTORY — PX: RIGHT/LEFT HEART CATH AND CORONARY ANGIOGRAPHY: CATH118266

## 2022-02-12 SURGERY — RIGHT/LEFT HEART CATH AND CORONARY ANGIOGRAPHY
Anesthesia: Moderate Sedation | Laterality: Bilateral

## 2022-02-12 MED ORDER — SODIUM CHLORIDE 0.9 % IV SOLN
INTRAVENOUS | Status: DC | PRN
  Administered 2022-02-12: 200 mL via INTRAVENOUS

## 2022-02-12 MED ORDER — LIDOCAINE HCL 1 % IJ SOLN
INTRAMUSCULAR | Status: AC
Start: 2022-02-12 — End: ?
  Filled 2022-02-12: qty 20

## 2022-02-12 MED ORDER — SODIUM CHLORIDE 0.9% FLUSH: 3.0000 mL | INTRAVENOUS | Status: DC | PRN

## 2022-02-12 MED ORDER — HYDRALAZINE HCL 20 MG/ML IJ SOLN: 10.0000 mg | INTRAMUSCULAR | Status: DC | PRN

## 2022-02-12 MED ORDER — IOHEXOL 300 MG/ML  SOLN
INTRAMUSCULAR | Status: DC | PRN
  Administered 2022-02-12: 13:00:00 110 mL

## 2022-02-12 MED ORDER — FAMOTIDINE 20 MG PO TABS: 40.0000 mg | ORAL_TABLET | Freq: Once | ORAL | Status: AC

## 2022-02-12 MED ORDER — METHYLPREDNISOLONE SODIUM SUCC 125 MG IJ SOLR
INTRAMUSCULAR | Status: AC
  Administered 2022-02-12: 11:00:00 125 mg via INTRAVENOUS
  Filled 2022-02-12: qty 2

## 2022-02-12 MED ORDER — FENTANYL CITRATE (PF) 100 MCG/2ML IJ SOLN
INTRAMUSCULAR | Status: DC | PRN
Start: 2022-02-12 — End: 2022-02-12
  Administered 2022-02-12 (×2): 25 ug via INTRAVENOUS

## 2022-02-12 MED ORDER — FENTANYL CITRATE (PF) 100 MCG/2ML IJ SOLN
INTRAMUSCULAR | Status: AC
  Filled 2022-02-12: qty 2

## 2022-02-12 MED ORDER — HYDRALAZINE HCL 20 MG/ML IJ SOLN
INTRAMUSCULAR | Status: AC
  Filled 2022-02-12: qty 1

## 2022-02-12 MED ORDER — HEPARIN SODIUM (PORCINE) 1000 UNIT/ML IJ SOLN
INTRAMUSCULAR | Status: DC | PRN
  Administered 2022-02-12: 5000 [IU] via INTRAVENOUS

## 2022-02-12 MED ORDER — LIDOCAINE HCL (PF) 1 % IJ SOLN
INTRAMUSCULAR | Status: DC | PRN
  Administered 2022-02-12: 20 mL
  Administered 2022-02-12: 2 mL

## 2022-02-12 MED ORDER — VERAPAMIL HCL 2.5 MG/ML IV SOLN
INTRAVENOUS | Status: DC | PRN
  Administered 2022-02-12: 2.5 mg via INTRA_ARTERIAL

## 2022-02-12 MED ORDER — VERAPAMIL HCL 2.5 MG/ML IV SOLN
INTRAVENOUS | Status: AC
  Filled 2022-02-12: qty 2

## 2022-02-12 MED ORDER — HEPARIN (PORCINE) IN NACL 1000-0.9 UT/500ML-% IV SOLN
INTRAVENOUS | Status: DC | PRN
  Administered 2022-02-12 (×2): 500 mL

## 2022-02-12 MED ORDER — ASPIRIN 81 MG PO CHEW
CHEWABLE_TABLET | ORAL | Status: AC
  Administered 2022-02-12: 11:00:00 81 mg via ORAL
  Filled 2022-02-12: qty 1

## 2022-02-12 MED ORDER — DIPHENHYDRAMINE HCL 50 MG/ML IJ SOLN: 50.0000 mg | Freq: Once | INTRAMUSCULAR | Status: AC

## 2022-02-12 MED ORDER — SODIUM CHLORIDE 0.9% FLUSH: 3.0000 mL | Freq: Two times a day (BID) | INTRAVENOUS | Status: DC

## 2022-02-12 MED ORDER — METHYLPREDNISOLONE SODIUM SUCC 125 MG IJ SOLR: 125.0000 mg | Freq: Once | INTRAMUSCULAR | Status: AC

## 2022-02-12 MED ORDER — HEPARIN SODIUM (PORCINE) 1000 UNIT/ML IJ SOLN
INTRAMUSCULAR | Status: AC
Start: 2022-02-12 — End: ?
  Filled 2022-02-12: qty 10

## 2022-02-12 MED ORDER — MIDAZOLAM HCL 2 MG/2ML IJ SOLN
INTRAMUSCULAR | Status: AC
  Filled 2022-02-12: qty 2

## 2022-02-12 MED ORDER — SODIUM CHLORIDE 0.9 % WEIGHT BASED INFUSION: 1.0000 mL/kg/h | INTRAVENOUS | Status: DC

## 2022-02-12 MED ORDER — HEPARIN (PORCINE) IN NACL 1000-0.9 UT/500ML-% IV SOLN
INTRAVENOUS | Status: AC
  Filled 2022-02-12: qty 1000

## 2022-02-12 MED ORDER — SODIUM CHLORIDE 0.9 % WEIGHT BASED INFUSION: 3.0000 mL/kg/h | INTRAVENOUS | Status: AC

## 2022-02-12 MED ORDER — MIDAZOLAM HCL 2 MG/2ML IJ SOLN
INTRAMUSCULAR | Status: DC | PRN
  Administered 2022-02-12: .5 mg via INTRAVENOUS
  Administered 2022-02-12: 1 mg via INTRAVENOUS

## 2022-02-12 MED ORDER — ONDANSETRON HCL 4 MG/2ML IJ SOLN: 4.0000 mg | Freq: Four times a day (QID) | INTRAMUSCULAR | Status: DC | PRN

## 2022-02-12 MED ORDER — ACETAMINOPHEN 325 MG PO TABS: 650.0000 mg | ORAL_TABLET | ORAL | Status: DC | PRN

## 2022-02-12 MED ORDER — SODIUM CHLORIDE 0.9 % IV SOLN
250.0000 mL | INTRAVENOUS | Status: DC | PRN
Start: 2022-02-12 — End: 2022-02-12

## 2022-02-12 MED ORDER — FAMOTIDINE 20 MG PO TABS
ORAL_TABLET | ORAL | Status: AC
  Administered 2022-02-12: 11:00:00 40 mg via ORAL
  Filled 2022-02-12: qty 2

## 2022-02-12 MED ORDER — ASPIRIN 81 MG PO CHEW: 81.0000 mg | CHEWABLE_TABLET | ORAL | Status: AC

## 2022-02-12 MED ORDER — DIPHENHYDRAMINE HCL 50 MG/ML IJ SOLN
INTRAMUSCULAR | Status: AC
  Administered 2022-02-12: 11:00:00 50 mg via INTRAVENOUS
  Filled 2022-02-12: qty 1

## 2022-02-12 MED ORDER — SODIUM CHLORIDE 0.9 % WEIGHT BASED INFUSION
1.0000 mL/kg/h | INTRAVENOUS | Status: DC
  Administered 2022-02-12: 15:00:00 1 mL/kg/h via INTRAVENOUS

## 2022-02-12 MED ORDER — HYDRALAZINE HCL 20 MG/ML IJ SOLN
INTRAMUSCULAR | Status: DC | PRN
  Administered 2022-02-12: 10 mg via INTRAVENOUS

## 2022-02-12 MED ORDER — LABETALOL HCL 5 MG/ML IV SOLN: 10.0000 mg | INTRAVENOUS | Status: DC | PRN

## 2022-02-12 SURGICAL SUPPLY — 21 items
CATH 5FR JL3.5 JR4 ANG PIG MP (CATHETERS) ×1 IMPLANT
CATH INFINITI 5FR JL4 (CATHETERS) ×1 IMPLANT
CATH SWAN GANZ 7F STRAIGHT (CATHETERS) ×1 IMPLANT
DEVICE CLOSURE MYNXGRIP 5F (Vascular Products) ×1 IMPLANT
DEVICE RAD COMP TR BAND LRG (VASCULAR PRODUCTS) ×1 IMPLANT
DEVICE SAFEGUARD 24CM (GAUZE/BANDAGES/DRESSINGS) ×1 IMPLANT
DRAPE BRACHIAL (DRAPES) ×1 IMPLANT
GLIDESHEATH SLEND SS 6F .021 (SHEATH) ×1 IMPLANT
GUIDEWIRE EMER 3M J .025X150CM (WIRE) ×1 IMPLANT
GUIDEWIRE INQWIRE 1.5J.035X260 (WIRE) IMPLANT
INQWIRE 1.5J .035X260CM (WIRE) ×2
KIT SYRINGE INJ CVI SPIKEX1 (MISCELLANEOUS) ×1 IMPLANT
NDL PERC 18GX7CM (NEEDLE) IMPLANT
NEEDLE PERC 18GX7CM (NEEDLE) ×2 IMPLANT
PACK CARDIAC CATH (CUSTOM PROCEDURE TRAY) ×2 IMPLANT
PROTECTION STATION PRESSURIZED (MISCELLANEOUS) ×2
SET ATX SIMPLICITY (MISCELLANEOUS) ×1 IMPLANT
SHEATH AVANTI 5FR X 11CM (SHEATH) ×1 IMPLANT
SHEATH AVANTI 7FRX11 (SHEATH) ×1 IMPLANT
STATION PROTECTION PRESSURIZED (MISCELLANEOUS) IMPLANT
WIRE GUIDERIGHT .035X150 (WIRE) ×3 IMPLANT

## 2022-02-12 NOTE — Discharge Instructions (Signed)
?

## 2022-02-13 ENCOUNTER — Other Ambulatory Visit: Payer: Self-pay

## 2022-02-13 ENCOUNTER — Emergency Department
Admission: EM | Admit: 2022-02-13 | Discharge: 2022-02-13 | Disposition: A | Discharge status: Home / Self Care | Payer: PPO | Attending: Emergency Medicine | Admitting: Emergency Medicine

## 2022-02-13 ENCOUNTER — Encounter: Payer: Self-pay | Admitting: Internal Medicine

## 2022-02-13 DIAGNOSIS — G8918 Other acute postprocedural pain: Secondary | ICD-10-CM | POA: Diagnosis not present

## 2022-02-13 DIAGNOSIS — S3991XA Unspecified injury of abdomen, initial encounter: Secondary | ICD-10-CM | POA: Diagnosis present

## 2022-02-13 DIAGNOSIS — L7632 Postprocedural hematoma of skin and subcutaneous tissue following other procedure: Secondary | ICD-10-CM | POA: Diagnosis not present

## 2022-02-13 DIAGNOSIS — S301XXA Contusion of abdominal wall, initial encounter: Secondary | ICD-10-CM | POA: Insufficient documentation

## 2022-02-13 DIAGNOSIS — S7011XA Contusion of right thigh, initial encounter: Secondary | ICD-10-CM | POA: Diagnosis not present

## 2022-02-13 DIAGNOSIS — X58XXXA Exposure to other specified factors, initial encounter: Secondary | ICD-10-CM | POA: Diagnosis not present

## 2022-02-13 MED ORDER — HYDROCODONE-ACETAMINOPHEN 5-325 MG PO TABS
0.5000 | ORAL_TABLET | Freq: Once | ORAL | Status: AC
  Administered 2022-02-13: 0.5 via ORAL
  Filled 2022-02-13: qty 1

## 2022-02-13 MED ORDER — HYDROCODONE-ACETAMINOPHEN 5-325 MG PO TABS: 0.5000 | ORAL_TABLET | Freq: Four times a day (QID) | ORAL | 0 refills | Status: DC | PRN

## 2022-02-13 NOTE — ED Triage Notes (Signed)
Pt presents to ER c/o post op issue.  Pt states he had cath procedure yesterday and today states he has some swelling in the right groin area.  Pt has noticeable swelling and bruising noted to right groin area.  Pt states area is very painful.  Pt denies any drainage from area.  Pt A&O x4 at this time in NAD.   ?

## 2022-02-13 NOTE — ED Notes (Signed)
Pt discharge information reviewed. Pt understands need for follow up care and when to return if symptoms worsen. All questions answered. Pt is alert and oriented with even and regular respirations. Pt is brought out of department in wheelchair with family.  ?

## 2022-02-13 NOTE — ED Provider Notes (Signed)
? ?[facility] ?Provider Note ? ? Event Date/Time  ? First MD Initiated Contact with Patient 02/13/22 2132   ?  (approximate) ?History  ?Post-op Problem ? ?HPI ?[name]. is a 81 y.o. male with a stated past medical history of pulmonary fibrosis who presents for right groin pain in the setting of catheterization 1 day prior to arrival and hematoma formation after this procedure.  Patient states that he was at his baseline level of pain around this area at approximately 6/10 in severity when it acutely worsened to 9/10 here this evening and he became concerned that he may have further bleeding.  Patient has not restarted his anticoagulation after his procedure and is scheduled to have it tomorrow.  Patient denies any palpable mass in that area or any feelings of pulsation in his groin.  Patient also denies any weakness/numbness/paresthesias in this extremity ?Physical Exam  ?Triage Vital Signs: ?ED Triage Vitals [02/13/22 2125]  ?Enc Vitals Group  ?   BP (!) 153/77  ?   Pulse Rate 87  ?   Resp 20  ?   Temp 98 ?F (36.7 ?C)  ?   Temp Source Oral  ?   SpO2 93 %  ?   Weight   ?   Height   ?   Head Circumference   ?   Peak Flow   ?   Pain Score 9  ?   Pain Loc   ?   Pain Edu?   ?   Excl. in [place]?   ? ?Most recent vital signs: ?Vitals:  ? 02/13/22 2215 02/13/22 2245  ?BP:  (!) 155/91  ?Pulse: 89 90  ?Resp: (!) 23 (!) 23  ?Temp:    ?SpO2: 94% 96%  ? ?General: Awake, oriented x4. ?CV:  Good peripheral perfusion.  ?Resp:  Normal effort.  ?Abd:  No distention.  ?Other:  Elderly Caucasian male laying in bed in no distress.  Right proximal thigh shows partly circumferential area of ecchymosis with tenderness to palpation.  Bedside ultrasound of this area does not show any evidence of pulsatile bleeding from the right femoral artery nor any large collection of subcutaneous fluid. ?ED Results / Procedures / Treatments  ? ?PROCEDURES: ?Critical Care performed: No ?Procedures ?MEDICATIONS ORDERED IN  ED: ?Medications  ?HYDROcodone-acetaminophen (NORCO/VICODIN) 5-325 MG per tablet 0.5 tablet (0.5 tablets Oral Given 02/13/22 2254)  ? ?IMPRESSION / MDM / ASSESSMENT AND PLAN / ED COURSE  ?I reviewed the triage vital signs and the nursing notes. ?             ?               ?Differential diagnosis includes, but is not limited to, hematoma, compartment syndrome, hip dislocation, femur fracture ?The patient is on the cardiac monitor to evaluate for evidence of arrhythmia and/or significant heart rate changes. ?Patient is an 81 year old male who presents for worsening pain over the catheterization site in his right groin after a heart catheterization yesterday.  Bedside ultrasound does not show any evidence of pulsatile bleeding from this artery nor any large fluid collection around this area.  Patient was encouraged to administer heating pads to this area as well as Tylenol for pain control.  As patient stated the pain was keeping him from sleep, I will try a low-dose Norco for pain control in the short-term. ?The patient has been reexamined and is ready to be discharged.  All diagnostic results have been reviewed and  discussed with the patient/family.  Care plan has been outlined and the patient/family understands all current diagnoses, results, and treatment plans.  There are no new complaints, changes, or physical findings at this time.  All questions have been addressed and answered. All medications, if any, that were given while in the emergency department or any that are being prescribed have been reviewed with the patient/family.  All side effects and adverse reactions have been explained.  Patient was instructed to, and agrees to follow-up with their primary care physician as well as return to the emergency department if any new or worsening symptoms develop. ? ?Dispo: Discharge home ? ?  ?FINAL CLINICAL IMPRESSION(S) / ED DIAGNOSES  ? ?Final diagnoses:  ?Postprocedural pain of extremity following cardiac  catheterization  ?Hematoma of groin, initial encounter  ? ?Rx / DC Orders  ? ?ED Discharge Orders   ? ?      Ordered  ?  HYDROcodone-acetaminophen (NORCO) 5-325 MG tablet  Every 6 hours PRN       ? 02/13/22 2251  ? ?  ?  ? ?  ? ?Note:  This document was prepared using Dragon voice recognition software and may include unintentional dictation errors. ?  ?Naaman Plummer, MD ?02/13/22 2322 ? ?

## 2022-02-13 NOTE — Significant Event (Signed)
Patient called the on-call provider tonight stated he had a heart catheterization done yesterday and subsequent hematoma shortly thereafter.  Yesterday when he left the hematoma was soft and mildly painful, but throughout the day today it is becoming more swollen and uncomfortable.  Currently it is causing quite a bit of pain and he says is very swollen and firm.  I advised him to present to the emergency department and to call EMS if he felt that it was growing rapidly.  He was in agreement with this plan. ? ?Andrez Grime, MD ? ?

## 2022-02-15 ENCOUNTER — Encounter: Payer: Self-pay | Admitting: Emergency Medicine

## 2022-02-15 ENCOUNTER — Emergency Department: Payer: PPO

## 2022-02-15 ENCOUNTER — Other Ambulatory Visit: Payer: Self-pay

## 2022-02-15 ENCOUNTER — Observation Stay
Admission: EM | Admit: 2022-02-15 | Discharge: 2022-02-16 | Disposition: A | Discharge status: Home / Self Care | Payer: PPO | Attending: Internal Medicine | Admitting: Internal Medicine

## 2022-02-15 DIAGNOSIS — Z0389 Encounter for observation for other suspected diseases and conditions ruled out: Secondary | ICD-10-CM | POA: Diagnosis not present

## 2022-02-15 DIAGNOSIS — Z20822 Contact with and (suspected) exposure to covid-19: Secondary | ICD-10-CM | POA: Diagnosis not present

## 2022-02-15 DIAGNOSIS — I739 Peripheral vascular disease, unspecified: Secondary | ICD-10-CM | POA: Insufficient documentation

## 2022-02-15 DIAGNOSIS — Z7901 Long term (current) use of anticoagulants: Secondary | ICD-10-CM | POA: Insufficient documentation

## 2022-02-15 DIAGNOSIS — Z87891 Personal history of nicotine dependence: Secondary | ICD-10-CM | POA: Diagnosis not present

## 2022-02-15 DIAGNOSIS — Z951 Presence of aortocoronary bypass graft: Secondary | ICD-10-CM | POA: Insufficient documentation

## 2022-02-15 DIAGNOSIS — M79604 Pain in right leg: Secondary | ICD-10-CM

## 2022-02-15 DIAGNOSIS — I97630 Postprocedural hematoma of a circulatory system organ or structure following a cardiac catheterization: Principal | ICD-10-CM | POA: Insufficient documentation

## 2022-02-15 DIAGNOSIS — J449 Chronic obstructive pulmonary disease, unspecified: Secondary | ICD-10-CM | POA: Diagnosis present

## 2022-02-15 DIAGNOSIS — Z8616 Personal history of COVID-19: Secondary | ICD-10-CM | POA: Insufficient documentation

## 2022-02-15 DIAGNOSIS — I724 Aneurysm of artery of lower extremity: Secondary | ICD-10-CM | POA: Diagnosis not present

## 2022-02-15 DIAGNOSIS — I10 Essential (primary) hypertension: Secondary | ICD-10-CM | POA: Diagnosis not present

## 2022-02-15 DIAGNOSIS — I729 Aneurysm of unspecified site: Secondary | ICD-10-CM

## 2022-02-15 DIAGNOSIS — I2510 Atherosclerotic heart disease of native coronary artery without angina pectoris: Secondary | ICD-10-CM | POA: Insufficient documentation

## 2022-02-15 DIAGNOSIS — R2241 Localized swelling, mass and lump, right lower limb: Secondary | ICD-10-CM | POA: Diagnosis present

## 2022-02-15 DIAGNOSIS — D62 Acute posthemorrhagic anemia: Secondary | ICD-10-CM | POA: Diagnosis not present

## 2022-02-15 DIAGNOSIS — M7989 Other specified soft tissue disorders: Secondary | ICD-10-CM | POA: Diagnosis not present

## 2022-02-15 DIAGNOSIS — G4733 Obstructive sleep apnea (adult) (pediatric): Secondary | ICD-10-CM | POA: Diagnosis present

## 2022-02-15 DIAGNOSIS — J8410 Pulmonary fibrosis, unspecified: Secondary | ICD-10-CM | POA: Diagnosis present

## 2022-02-15 LAB — BASIC METABOLIC PANEL
Anion gap: 7 (ref 5–15)
BUN: 26 mg/dL — ABNORMAL HIGH (ref 8–23)
CO2: 29 mmol/L (ref 22–32)
Calcium: 8.7 mg/dL — ABNORMAL LOW (ref 8.9–10.3)
Chloride: 105 mmol/L (ref 98–111)
Creatinine, Ser: 0.97 mg/dL (ref 0.61–1.24)
GFR, Estimated: >60 mL/min (ref >60)
Glucose, Bld: 108 mg/dL — ABNORMAL HIGH (ref 70–99)
Potassium: 3.9 mmol/L (ref 3.5–5.1)
Sodium: 141 mmol/L (ref 135–145)

## 2022-02-15 LAB — CBC
HCT: 32.5 % — ABNORMAL LOW (ref 39.0–52.0)
HCT: 28.7 % — ABNORMAL LOW (ref 39.0–52.0)
Hemoglobin: 10.1 g/dL — ABNORMAL LOW (ref 13.0–17.0)
Hemoglobin: 9.3 g/dL — ABNORMAL LOW (ref 13.0–17.0)
MCH: 29.4 pg (ref 26.0–34.0)
MCH: 29.5 pg (ref 26.0–34.0)
MCHC: 31.1 g/dL (ref 30.0–36.0)
MCHC: 32.4 g/dL (ref 30.0–36.0)
MCV: 94.8 fL (ref 80.0–100.0)
MCV: 91.1 fL (ref 80.0–100.0)
Platelets: 156 10*3/uL (ref 150–400)
Platelets: 138 10*3/uL — ABNORMAL LOW (ref 150–400)
RBC: 3.43 MIL/uL — ABNORMAL LOW (ref 4.22–5.81)
RBC: 3.15 MIL/uL — ABNORMAL LOW (ref 4.22–5.81)
RDW: 15.2 % (ref 11.5–15.5)
RDW: 15.2 % (ref 11.5–15.5)
WBC: 9.2 10*3/uL (ref 4.0–10.5)
WBC: 8.3 10*3/uL (ref 4.0–10.5)
nRBC: 0 % (ref 0.0–0.2)
nRBC: 0 % (ref 0.0–0.2)

## 2022-02-15 LAB — TYPE AND SCREEN
ABO/RH(D): O POS
Antibody Screen: NEGATIVE

## 2022-02-15 LAB — CBC WITH DIFFERENTIAL/PLATELET
Abs Immature Granulocytes: 0.03 10*3/uL (ref 0.00–0.07)
Basophils Absolute: 0 10*3/uL (ref 0.0–0.1)
Basophils Relative: 0 %
Eosinophils Absolute: 0.3 10*3/uL (ref 0.0–0.5)
Eosinophils Relative: 4 %
HCT: 33 % — ABNORMAL LOW (ref 39.0–52.0)
Hemoglobin: 10.5 g/dL — ABNORMAL LOW (ref 13.0–17.0)
Immature Granulocytes: 0 %
Lymphocytes Relative: 19 %
Lymphs Abs: 1.7 10*3/uL (ref 0.7–4.0)
MCH: 29.6 pg (ref 26.0–34.0)
MCHC: 31.8 g/dL (ref 30.0–36.0)
MCV: 93 fL (ref 80.0–100.0)
Monocytes Absolute: 1 10*3/uL (ref 0.1–1.0)
Monocytes Relative: 11 %
Neutro Abs: 5.9 10*3/uL (ref 1.7–7.7)
Neutrophils Relative %: 66 %
Platelets: 167 10*3/uL (ref 150–400)
RBC: 3.55 MIL/uL — ABNORMAL LOW (ref 4.22–5.81)
RDW: 15 % (ref 11.5–15.5)
WBC: 8.9 10*3/uL (ref 4.0–10.5)
nRBC: 0 % (ref 0.0–0.2)

## 2022-02-15 LAB — RESP PANEL BY RT-PCR (FLU A&B, COVID) ARPGX2
Influenza A by PCR: NEGATIVE
Influenza B by PCR: NEGATIVE
SARS Coronavirus 2 by RT PCR: NEGATIVE

## 2022-02-15 LAB — PROTIME-INR
INR: 1.4 — ABNORMAL HIGH (ref 0.8–1.2)
Prothrombin Time: 17.3 seconds — ABNORMAL HIGH (ref 11.4–15.2)

## 2022-02-15 MED ORDER — HYDROCODONE-ACETAMINOPHEN 5-325 MG PO TABS: 0.5000 | ORAL_TABLET | Freq: Four times a day (QID) | ORAL | Status: DC | PRN

## 2022-02-15 MED ORDER — DILTIAZEM HCL ER COATED BEADS 120 MG PO CP24
120.0000 mg | ORAL_CAPSULE | Freq: Two times a day (BID) | ORAL | Status: DC
  Administered 2022-02-15 – 2022-02-16 (×2): 120 mg via ORAL
  Filled 2022-02-15 (×3): qty 1

## 2022-02-15 MED ORDER — PIRFENIDONE 801 MG PO TABS: 801.0000 mg | ORAL_TABLET | Freq: Three times a day (TID) | ORAL | Status: DC

## 2022-02-15 MED ORDER — ACETAMINOPHEN 650 MG RE SUPP: 650.0000 mg | Freq: Four times a day (QID) | RECTAL | Status: DC | PRN

## 2022-02-15 MED ORDER — METOPROLOL SUCCINATE ER 50 MG PO TB24
50.0000 mg | ORAL_TABLET | Freq: Every day | ORAL | Status: DC
  Administered 2022-02-16: 50 mg via ORAL
  Filled 2022-02-15: qty 1

## 2022-02-15 MED ORDER — ALBUTEROL SULFATE (2.5 MG/3ML) 0.083% IN NEBU: 3.0000 mL | INHALATION_SOLUTION | Freq: Four times a day (QID) | RESPIRATORY_TRACT | Status: DC | PRN

## 2022-02-15 MED ORDER — ACETAMINOPHEN 325 MG PO TABS
650.0000 mg | ORAL_TABLET | Freq: Four times a day (QID) | ORAL | Status: DC | PRN
Start: 2022-02-15 — End: 2022-02-16

## 2022-02-15 MED ORDER — IRBESARTAN 150 MG PO TABS
150.0000 mg | ORAL_TABLET | Freq: Every day | ORAL | Status: DC
Start: 2022-02-16 — End: 2022-02-16
  Administered 2022-02-16: 150 mg via ORAL
  Filled 2022-02-15: qty 1

## 2022-02-15 MED ORDER — ATORVASTATIN CALCIUM 20 MG PO TABS
40.0000 mg | ORAL_TABLET | Freq: Every day | ORAL | Status: DC
  Administered 2022-02-16: 40 mg via ORAL
  Filled 2022-02-15: qty 2

## 2022-02-15 MED ORDER — LEVOTHYROXINE SODIUM 25 MCG PO TABS
25.0000 ug | ORAL_TABLET | Freq: Every day | ORAL | Status: DC
  Administered 2022-02-16: 25 ug via ORAL
  Filled 2022-02-15: qty 1

## 2022-02-15 NOTE — Consult Note (Signed)
[facility] Cardiology  CARDIOLOGY CONSULT NOTE  Patient ID: [name]. MRN: [id number] DOB/AGE: [birth year] 81 y.o.  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  He underwent a diagnostic heart catheterization with Dr. Nehemiah Massed on 02/12/2022, and immediately following the procedure he had a large right femoral hematoma at the access site.  Manual compression was held, and this appeared stable he was ultimately discharged later that day.  On Friday he experienced worsening pain and swelling in his right thigh and presented to the emergency department at which time he was evaluated, and bedside ultrasound showed no evidence of pseudoaneurysm.  His pain was ultimately controlled and he was discharged home that evening.  He resumed his Eliquis on Saturday as instructed by Dr. Nehemiah Massed, and today his thigh has been swelling and become more painful.  He called the on-call provider again today and stated that his thigh was painful and swollen compared to the day prior to presenting to the emergency department.  Formal ultrasound of his right femoral artery shows no evidence of pseudoaneurysm.  Hemoglobin is 10.5, decreased from 13.2 2 weeks ago.  Vital signs are stable.  Review of systems complete and found to be negative unless listed above     Past Medical History:  Diagnosis Date   Allergy    Arthritis    Coronary artery disease    COVID-19 11/19/2019   Dysrhythmia    Hypertension    Peripheral vascular disease ([place])    Pulmonary fibrosis ([place])    Sleep apnea    CPAP   Tremor     Past Surgical History:   Procedure Laterality Date   CARDIAC SURGERY     CARDIOVERSION N/A 03/03/2018   Procedure: CARDIOVERSION;  Surgeon: [name], MD;  Location: [facility];  Service: Cardiovascular;  Laterality: N/A;   CARDIOVERSION N/A 05/24/2018   Procedure: CARDIOVERSION;  Surgeon: [name], MD;  Location: [facility];  Service: Cardiovascular;  Laterality: N/A;   CATARACT EXTRACTION W/PHACO Right 06/03/2020   Procedure: CATARACT EXTRACTION PHACO AND INTRAOCULAR LENS PLACEMENT ([place]) RIGHT 6.07  00:48.0;  Surgeon: Eulogio Bear, MD;  Location: [facility];  Service: Ophthalmology;  Laterality: Right;  sleep apnea   CORONARY ARTERY BYPASS GRAFT     EYE SURGERY     LEG SURGERY Left    stents placed, Fem-Fem bypass   RIGHT/LEFT HEART CATH AND CORONARY ANGIOGRAPHY Bilateral 02/12/2022   Procedure: RIGHT/LEFT HEART CATH AND CORONARY ANGIOGRAPHY;  Surgeon: [name], MD;  Location: [facility];  Service: Cardiovascular;  Laterality: Bilateral;   TONSILLECTOMY      (Not in a hospital admission)  Social History   Socioeconomic History   Marital status: Married    Spouse name: Not on file   Number of children: Not on file   Years of education: Not on file   Highest education level: Not on file  Occupational History   Not on file  Tobacco Use   Smoking status: Former    Types: Cigarettes    Quit date: 02/16/1979    Years since quitting: 43.0   Smokeless tobacco: Never  Vaping Use   Vaping Use: Never  used  Substance and Sexual Activity   Alcohol use: Yes    Comment: rarely   Drug use: No   Sexual activity: Not on file  Other Topics Concern   Not on file  Social History Narrative   Not on file   Social Determinants of Health   Financial Resource Strain: Not on file  Food Insecurity: Not on file  Transportation Needs: Not on file  Physical Activity: Not on file  Stress: Not on file  Social Connections: Not on file  Intimate Partner Violence: Not on file     Family History  Problem Relation Age of Onset   Thyroid disease Mother    Macular degeneration Mother    Cancer Father       Review of systems complete and found to be negative unless listed above      PHYSICAL EXAM  General: Well developed, well nourished, in no acute distress HEENT:  Normocephalic and atramatic Neck:  No JVD.  Lungs: Clear bilaterally to auscultation and percussion. Heart: HRRR . Normal S1 and S2 without gallops or murmurs.  Abdomen: Bowel sounds are positive, abdomen soft and non-tender  Msk:  Back normal, normal gait. Normal strength and tone for age. Extremities: Significant bruising and swelling of the right leg from the femoral artery down to his knee.  The thigh is tight compared to the left side. Neuro: Alert and oriented X 3. Psych:  Good affect, responds appropriately    Labs:   Lab Results  Component Value Date   WBC 8.9 02/15/2022   HGB 10.5 (L) 02/15/2022   HCT 33.0 (L) 02/15/2022   MCV 93.0 02/15/2022   PLT 167 02/15/2022    Recent Labs  Lab 02/15/22 1542  NA 141  K 3.9  CL 105  CO2 29  BUN 26*  CREATININE 0.97  CALCIUM 8.7*  GLUCOSE 108*   Lab Results  Component Value Date   CKTOTAL 1,056 (H) 01/20/2012   CKMB 10.0 (H) 01/20/2012   TROPONINI 0.04 01/20/2012   No results found for: CHOL No results found for: HDL No results found for: LDLCALC No results found for: TRIG No results found for: CHOLHDL No results found for: LDLDIRECT    Radiology: CARDIAC CATHETERIZATION  Result Date: 02/12/2022   Prox RCA lesion is 40% stenosed.   Mid RCA lesion is 60% stenosed.   Dist RCA lesion is 40% stenosed.   Prox LAD to Mid LAD lesion is 100% stenosed.   Mid LM lesion is 40% stenosed.   Prox Cx lesion is 45% stenosed.   The left ventricular systolic function is normal.   LV end diastolic pressure is mildly elevated.   The left ventricular ejection fraction is greater than 65% by visual estimate.   Hemodynamic findings consistent with  moderate pulmonary hypertension. 81 year old male with a significant progression of severe shortness of breath with normal LV systolic function by echocardiogram and ejection fraction of 65% with history of previous atrial myxoma. Normal LV systolic function ejection fraction 65% Moderate atherosclerosis left circumflex and right coronary artery Occluded ostial LAD without collaterals Right heart catheterization Pulmonary artery pressures were 62/26 mm with a mean of 44 mm Right ventricle pressures were 66 over 1 mm with a mean of 10 mm Right atrium was 14/4 with a mean of 9 mm Plan Continued aggressive medical management for diffuse coronary artery disease not amenable to PCI and stent placement Continuation of treatment for pulmonary hypertension moderate by pulmonary pressures today Continue hypertension control  and Cardiac rehabilitation   [place] Lower Ext Art Right [employer]  Result Date: 02/15/2022 CLINICAL DATA:  Status post catheterization, evaluate for pseudoaneurysm EXAM: LIMITED RIGHT LOWER EXTREMITY ARTERIAL DUPLEX SCAN TECHNIQUE: Gray-scale sonography as well as color Doppler and duplex ultrasound was performed to evaluate the lower extremity arteries including the common, superficial and profunda femoral arteries, popliteal artery and calf arteries. COMPARISON:  None. FINDINGS: Targeted ultrasound examination of the right groin at the site of vascular access reveals no evidence of pseudoaneurysm or other abnormality of the common femoral artery or common femoral vein. IMPRESSION: No evidence of pseudoaneurysm or other ultrasound abnormality of the right groin. Electronically Signed   By: Delanna Ahmadi M.D.   On: 02/15/2022 16:50    EKG: None today.   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  # Right  thigh hematoma following LHC  # Atrial fibrillation on eliquis Patient is presenting with swelling of his right thigh following heart catheterization on 02/12/2022.  Ultrasound is negative for pseudoaneurysm, but he does have impressive swelling of his right  thigh above the knee in comparison to his left leg.  His hemoglobin has decreased nearly 3 points as well from his prior to heart catheterization level.  I think it is worthwhile observing him overnight and repeating his hemoglobin in the setting of taking Eliquis earlier today. -Recommend observation overnight -Please repeat a hemoglobin in 6 hours and again in the morning -Hold Eliquis until Dr. Nehemiah Massed instructs him to resume this medication -Monitor vital signs closely. -Does not appear to have signs or symptoms suggestive of a retroperitoneal hematoma, so I do not recommend any imaging of his abdomen at this time.  Signed: Andrez Grime MD 02/15/2022, 5:27 PM

## 2022-02-15 NOTE — H&P (Signed)
History and Physical    [name]. XBL:[id number] DOB: 11-05-1941 DOA: 02/15/2022  PCP: Adin Hector, MD  Patient coming from: Home.  Chief Complaint: Right thigh swelling.  HPI: [name]. is a 81 y.o. male with history of CAD, sleep apnea, pulmonary fibrosis with pulmonary hypertension, atrial fibrillation who has had a cardiac cath on Thursday following which patient has noted increasing swelling on his right thigh.  Patient had come to the ER the following day follow-up with cardiology had a sonogram done which did not show any pseudoaneurysm.  The following day patient started taking Eliquis.  He noticed worsening swelling and pain and decided to come to the ER.  ED Course: In the ER patient was evaluated by cardiologist.  Blood work show about 3 g drop of hemoglobin when compared to the done 2 weeks ago.  Patient's leg swelling also has markedly worsened.  At this time cardiologist recommended observation overnight since patient has only taken his Eliquis this morning.  Patient on exam does not have any signs of compartment syndrome.  He is able to move his extremities.  No deficits in his sensation.  COVID test is negative.  Ultrasound of the right arterial system does not show any pseudoaneurysm.  Review of Systems: As per HPI, rest all negative.   Past Medical History:  Diagnosis Date   Allergy    Arthritis    Coronary artery disease    COVID-19 11/19/2019   Dysrhythmia    Hypertension    Peripheral vascular disease ([place])    Pulmonary fibrosis ([place])    Sleep apnea    CPAP   Tremor     Past Surgical History:  Procedure Laterality Date   CARDIAC SURGERY     CARDIOVERSION N/A 03/03/2018   Procedure: CARDIOVERSION;  Surgeon: [name], MD;  Location: [facility];  Service: Cardiovascular;  Laterality: N/A;   CARDIOVERSION N/A 05/24/2018   Procedure: CARDIOVERSION;  Surgeon: [name], MD;  Location: [facility];  Service: Cardiovascular;   Laterality: N/A;   CATARACT EXTRACTION W/PHACO Right 06/03/2020   Procedure: CATARACT EXTRACTION PHACO AND INTRAOCULAR LENS PLACEMENT ([place]) RIGHT 6.07  00:48.0;  Surgeon: Eulogio Bear, MD;  Location: [facility];  Service: Ophthalmology;  Laterality: Right;  sleep apnea   CORONARY ARTERY BYPASS GRAFT     EYE SURGERY     LEG SURGERY Left    stents placed, Fem-Fem bypass   RIGHT/LEFT HEART CATH AND CORONARY ANGIOGRAPHY Bilateral 02/12/2022   Procedure: RIGHT/LEFT HEART CATH AND CORONARY ANGIOGRAPHY;  Surgeon: [name], MD;  Location: [facility];  Service: Cardiovascular;  Laterality: Bilateral;   TONSILLECTOMY       reports that he quit smoking about 43 years ago. His smoking use included cigarettes. He has never used smokeless tobacco. He reports current alcohol use. He reports that he does not use drugs.  Allergies  Allergen Reactions   Fish Allergy Hives   Tramadol Other (See Comments)    confusion   Hydrochlorothiazide Other (See Comments)    worsens gout   Iodine Rash   Shellfish Allergy Rash    Family History  Problem Relation Age of Onset   Thyroid disease Mother    Macular degeneration Mother    Cancer Father     Prior to Admission medications   Medication Sig Start Date End Date Taking? Authorizing Provider  acetaminophen (TYLENOL) 650 MG CR tablet Take 1,300 mg by mouth every  8 (eight) hours as needed for pain.     [provider]  albuterol (VENTOLIN HFA) 108 (90 Base) MCG/ACT inhaler Inhale 1-2 puffs into the lungs every 6 (six) hours as needed for wheezing or shortness of breath. 11/19/19   Coral Spikes, DO  ALPRAZolam Duanne Moron) 0.25 MG tablet Take 0.25 mg by mouth 3 (three) times daily as needed for anxiety. 05/05/18   [provider]  atorvastatin (LIPITOR) 40 MG tablet Take 40 mg by mouth daily. 11/26/21   [provider]  diltiazem (CARDIZEM CD) 120 MG 24 hr capsule Take 120 mg by mouth 2 (two) times daily.  07/04/18   [provider]  furosemide (LASIX) 40 MG tablet Take 40 mg by mouth daily. 03/05/16   [provider]  HYDROcodone-acetaminophen (NORCO) 5-325 MG tablet Take 0.5 tablets by mouth every 6 (six) hours as needed for moderate pain. 02/13/22   Naaman Plummer, MD  levothyroxine (SYNTHROID) 25 MCG tablet Take 25 mcg by mouth every morning. 12/31/21   [provider]  metoprolol succinate (TOPROL-XL) 50 MG 24 hr tablet Take 50 mg by mouth daily. 09/07/18   [provider]  Multiple Vitamin (MULTIVITAMIN) tablet Take 1 tablet by mouth daily. Centrum for men    [provider]  Pirfenidone 801 MG TABS Take 801 mg by mouth 3 (three) times daily. 07/17/21   [provider]  telmisartan (MICARDIS) 40 MG tablet Take 40 mg by mouth daily. 12/14/21   [provider]    Physical Exam: Constitutional: Moderately built and nourished. Vitals:   02/15/22 1528 02/15/22 1700  BP: 139/70 140/70  Pulse: 92 90  Resp: 20 16  Temp: 98.1 F (36.7 C)   TempSrc: Oral   SpO2: 96% 98%   Eyes: Anicteric no pallor. ENMT: No discharge from the ears eyes nose and mouth. Neck: No mass felt.  No neck rigidity. Respiratory: No rhonchi or crepitations. Cardiovascular: S1-S2 heard. Abdomen: Soft nontender bowel sound present. Musculoskeletal: Large ecchymotic area on the proximal aspect of the right thigh. Skin: Large ecchymotic area on the proximal occipital right thigh. Neurologic: Alert awake oriented time place and person.  Moves all extremities. Psychiatric: Appears normal.  Normal affect.   Labs on Admission: I have personally reviewed following labs and imaging studies  CBC: Recent Labs  Lab 02/15/22 1542  WBC 8.9  NEUTROABS 5.9  HGB 10.5*  HCT 33.0*  MCV 93.0  PLT 563   Basic Metabolic Panel: Recent Labs  Lab 02/15/22 1542  NA 141  K 3.9  CL 105  CO2 29  GLUCOSE 108*  BUN 26*  CREATININE 0.97  CALCIUM 8.7*   GFR: Estimated  Creatinine Clearance: 75.3 mL/min (by C-G formula based on SCr of 0.97 mg/dL). Liver Function Tests: No results for input(s): AST, ALT, ALKPHOS, BILITOT, PROT, ALBUMIN in the last 168 hours. No results for input(s): LIPASE, AMYLASE in the last 168 hours. No results for input(s): AMMONIA in the last 168 hours. Coagulation Profile: Recent Labs  Lab 02/15/22 1542  INR 1.4*   Cardiac Enzymes: No results for input(s): CKTOTAL, CKMB, CKMBINDEX, TROPONINI in the last 168 hours. BNP (last 3 results) No results for input(s): PROBNP in the last 8760 hours. HbA1C: No results for input(s): HGBA1C in the last 72 hours. CBG: No results for input(s): GLUCAP in the last 168 hours. Lipid Profile: No results for input(s): CHOL, HDL, LDLCALC, TRIG, CHOLHDL, LDLDIRECT in the last 72 hours. Thyroid Function Tests: No results for  input(s): TSH, T4TOTAL, FREET4, T3FREE, THYROIDAB in the last 72 hours. Anemia Panel: No results for input(s): VITAMINB12, FOLATE, FERRITIN, TIBC, IRON, RETICCTPCT in the last 72 hours. Urine analysis: No results found for: COLORURINE, APPEARANCEUR, LABSPEC, [place], GLUCOSEU, HGBUR, BILIRUBINUR, KETONESUR, PROTEINUR, UROBILINOGEN, NITRITE, LEUKOCYTESUR Sepsis Labs: _0 (procalcitonin:4,lacticidven:4) ) Recent Results (from the past 240 hour(s))  Resp Panel by RT-PCR (Flu A&B, Covid) Nasopharyngeal Swab     Status: None   Collection Time: 02/15/22  5:38 PM   Specimen: Nasopharyngeal Swab; Nasopharyngeal(NP) swabs in vial transport medium  Result Value Ref Range Status   SARS Coronavirus 2 by RT PCR NEGATIVE NEGATIVE Final    Comment: (NOTE) SARS-CoV-2 target nucleic acids are NOT DETECTED.  The SARS-CoV-2 RNA is generally detectable in upper respiratory specimens during the acute phase of infection. The lowest concentration of SARS-CoV-2 viral copies this assay can detect is 138 copies/mL. A negative result does not preclude SARS-Cov-2 infection and should not be  used as the sole basis for treatment or other patient management decisions. A negative result may occur with  improper specimen collection/handling, submission of specimen other than nasopharyngeal swab, presence of viral mutation(s) within the areas targeted by this assay, and inadequate number of viral copies(<138 copies/mL). A negative result must be combined with clinical observations, patient history, and epidemiological information. The expected result is Negative.  Fact Sheet for Patients:  EntrepreneurPulse.com.au  Fact Sheet for Healthcare Providers:  IncredibleEmployment.be  This test is no t yet approved or cleared by the [place] FDA and  has been authorized for detection and/or diagnosis of SARS-CoV-2 by FDA under an Emergency Use Authorization (EUA). This EUA will remain  in effect (meaning this test can be used) for the duration of the COVID-19 declaration under Section 564(b)(1) of the Act, 21 U.S.C.section 360bbb-3(b)(1), unless the authorization is terminated  or revoked sooner.       Influenza A by PCR NEGATIVE NEGATIVE Final   Influenza B by PCR NEGATIVE NEGATIVE Final    Comment: (NOTE) The Xpert Xpress SARS-CoV-2/FLU/RSV plus assay is intended as an aid in the diagnosis of influenza from Nasopharyngeal swab specimens and should not be used as a sole basis for treatment. Nasal washings and aspirates are unacceptable for Xpert Xpress SARS-CoV-2/FLU/RSV testing.  Fact Sheet for Patients: EntrepreneurPulse.com.au  Fact Sheet for Healthcare Providers: IncredibleEmployment.be  This test is not yet approved or cleared by the [place] FDA and has been authorized for detection and/or diagnosis of SARS-CoV-2 by FDA under an Emergency Use Authorization (EUA). This EUA will remain in effect (meaning this test can be used) for the duration of the COVID-19 declaration under Section  564(b)(1) of the Act, 21 U.S.C. section 360bbb-3(b)(1), unless the authorization is terminated or revoked.  Performed at [facility], [address], [place], [place] [address]      Radiological Exams on Admission: [place] Lower Ext Art Right Ltd  Result Date: 02/15/2022 CLINICAL DATA:  Status post catheterization, evaluate for pseudoaneurysm EXAM: LIMITED RIGHT LOWER EXTREMITY ARTERIAL DUPLEX SCAN TECHNIQUE: Gray-scale sonography as well as color Doppler and duplex ultrasound was performed to evaluate the lower extremity arteries including the common, superficial and profunda femoral arteries, popliteal artery and calf arteries. COMPARISON:  None. FINDINGS: Targeted ultrasound examination of the right groin at the site of vascular access reveals no evidence of pseudoaneurysm or other abnormality of the common femoral artery or common femoral vein. IMPRESSION: No evidence of pseudoaneurysm or other ultrasound abnormality of the right groin. Electronically Signed  By: Delanna Ahmadi M.D.   On: 02/15/2022 16:50      Assessment/Plan Principal Problem:   ABLA (acute blood loss anemia) Active Problems:   Benign essential hypertension   COPD (chronic obstructive pulmonary disease) (HCC)   OSA (obstructive sleep apnea)   Pulmonary fibrosis (HCC)    Acute blood loss anemia with large ecchymotic area in the right thigh after recent cardiac cath and has taken Eliquis this morning admitted for further observation given the worsening swelling of the right thigh and ecchymotic area in the presence of Eliquis.  We will check serial CBCs-type and screen and hold and transfuse if hemoglobin less than 7.  Holding Eliquis.  Patient agreeable to holding Eliquis. History of A-fib on Cardizem and metoprolol.  Holding Eliquis due to 1. History of pulmonary fibrosis and pulmonary hypertension on pirfenidone. History of sleep apnea on CPAP at bedtime. CAD denies any chest pain. Hypertension on Micardis  Cardizem and metoprolol.   DVT prophylaxis: Holding anticoagulation due to right thigh swelling. Code Status: Full code. Family Communication: Patient's wife at the bedside. Disposition Plan: Home. Consults called: Cardiology. Admission status: Observation.   Rise Patience MD [facility] Pager [phone].  If 7PM-7AM, please contact night-coverage [email or web address] Password [facility]  02/15/2022, 6:53 PM

## 2022-02-15 NOTE — ED Provider Notes (Signed)
? ?[facility] ?Provider Note ? ? ? Event Date/Time  ? First MD Initiated Contact with Patient 02/15/22 1544   ?  (approximate) ? ? ?History  ? ?Post-op Problem ? ? ?HPI ? ?[name]. is a 81 y.o. male with past medical history of pulmonary fibrosis, peripheral vascular disease and coronary disease who presents with ecchymosis at the site of a right femoral cath performed on 3/9.  Patient had a cath performed by Dr. Nehemiah Massed, he notes that after the procedure there was some bleeding that they held pressure on he was discharged home.  He presented to the emergency department the next day with ecchymosis and the ED physician performed a bedside ultrasound and patient was discharged.  He had been told to put heating pad on it which has been doing.  He presents today with ongoing swelling of the right leg although pain seems to be improving.  Denies any weakness lightheadedness or syncope.  Denies chest pain abdominal pain back pain.  No paresthesias.  He is on Eliquis for A-fib which she resumed yesterday. ?  ? ?Past Medical History:  ?Diagnosis Date  ? Allergy   ? Arthritis   ? Coronary artery disease   ? COVID-19 11/19/2019  ? Dysrhythmia   ? Hypertension   ? Peripheral vascular disease ([place])   ? Pulmonary fibrosis ([place])   ? Sleep apnea   ? CPAP  ? Tremor   ? ? ?Patient Active Problem List  ? Diagnosis Date Noted  ? ABLA (acute blood loss anemia) 02/15/2022  ? Angina pectoris ([place]) 02/03/2022  ? 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 Cellulitis of leg, left 11/07/2018  ? Lymphedema 08/17/2018  ? Carotid stenosis 08/14/2018  ? CAD (coronary artery disease) 08/12/2018  ? COPD (chronic obstructive pulmonary disease) ([place]) 08/12/2018  ? Gout 08/12/2018  ? Hyperglycemia 08/12/2018  ? Mixed hyperlipidemia 08/12/2018  ? Peripheral vascular disease ([place]) 08/12/2018  ? Bilateral leg edema 07/12/2018  ? SOBOE (shortness of breath on exertion)  07/12/2018  ? Left wrist pain 10/07/2017  ? Morbid obesity ([place]) 10/07/2017  ? Radial styloid tenosynovitis ([place]) 10/07/2017  ? Senile purpura ([place]) 09/20/2017  ? 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 Pulmonary fibrosis ([place]) 03/13/2016  ? 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 Essential tremor 11/05/2015  ? Persistent atrial fibrillation ([place]) 09/30/2015  ? Benign essential hypertension 05/10/2015  ? 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  ? Right knee pain 02/26/2015  ? 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/12/2015  ? OSA (obstructive sleep apnea) 02/12/2015  ? ? ? ?Physical Exam  ?Triage Vital Signs: ?ED Triage Vitals [02/15/22 1528]  ?Enc Vitals Group  ?   BP 139/70  ?   Pulse Rate 92  ?   Resp 20  ?   Temp 98.1 ?F (36.7 ?C)  ?   Temp Source Oral  ?   SpO2 96 %  ?   Weight   ?   Height   ?   Head Circumference   ?   Peak Flow   ?   Pain Score   ?   Pain Loc   ?   Pain Edu?   ?   Excl. in [place]?   ? ? ?Most recent vital signs: ?Vitals:  ? 02/15/22 1528 02/15/22 1700  ?BP: 139/70 140/70  ?Pulse: 92 90  ?Resp: 20 16  ?Temp: 98.1 ?F (36.7 ?C)   ?SpO2:  96% 98%  ? ? ? ?General: Awake, no distress.  ?CV:  Good peripheral perfusion.  ?Resp:  Normal effort.  ?Abd:  No distention.  Abdomen is soft and nontender throughout ?Neuro:             Awake, Alert, Oriented x 3  ?Other:  Significant swelling of the right calf with ecchymosis as visualized below, compartment is soft, no femoral artery thrill ?2+ DP pulses ? ? ? ? ?ED Results / Procedures / Treatments  ?Labs ?(all labs ordered are listed, but only abnormal results are displayed) ?Labs Reviewed  ?BASIC METABOLIC PANEL - Abnormal; Notable for the following components:  ?    Result Value  ? Glucose, Bld 108 (*)   ? BUN 26 (*)   ? Calcium 8.7 (*)   ? All other components within normal limits  ?CBC WITH DIFFERENTIAL/PLATELET - Abnormal; Notable for the following components:  ? RBC 3.55 (*)   ? Hemoglobin 10.5 (*)   ? HCT 33.0 (*)   ? All other  components within normal limits  ?PROTIME-INR - Abnormal; Notable for the following components:  ? Prothrombin Time 17.3 (*)   ? INR 1.4 (*)   ? All other components within normal limits  ?RESP PANEL BY RT-PCR (FLU A&B, COVID) [id number]  ? ? ? ?EKG ? ? ? ? ?RADIOLOGY ?ED radiology report from the ultrasound which is negative for pseudoaneurysm ? ? ?PROCEDURES: ? ?Critical Care performed: No ? ?Procedures ? ?The patient is on the cardiac monitor to evaluate for evidence of arrhythmia and/or significant heart rate changes. ? ? ?MEDICATIONS ORDERED IN ED: ?Medications - No data to display ? ? ?IMPRESSION / MDM / ASSESSMENT AND PLAN / ED COURSE  ?I reviewed the triage vital signs and the nursing notes. ?             ?               ? ?Differential diagnosis includes, but is not limited to, postoperative hematoma, retroperitoneal bleed, femoral artery pseudoaneurysm, dissection ? ?Patient is an 81 year old male who presents with swelling and pain of his right thigh at the site of the femoral cath.  This was performed on 3/9.  He resumed his Eliquis yesterday.  He feels like the thigh itself is more swollen but the pain seems to be somewhat improved.  Has been putting a heating pad on it.  He has no symptoms to suggest RP hematoma including no syncope lightheadedness abdominal pain back pain and his is hemodynamically stable.  On exam he has fairly impressive ecchymosis of the groin region extending all the way down to the thigh which is swollen but with soft compartments.  There is no femoral artery thrill.  Abdomen is soft and nontender and there is no bruising that extends above the inguinal ligament.  His hemoglobin today is 10.5 from 13 last month.  I discussed the case with Dr. Hulda Humphrey with cardiology who also saw the picture of the site which is in the chart notes that this degree of ecchymosis is somewhat greater than what is to be expected.  He agrees with obtaining an ultrasound to rule out pseudoaneurysm  recommends obtaining a 6-hour hemoglobin to ensure not dropping.  We will hold the Eliquis. ? ?  ? ? ?FINAL CLINICAL IMPRESSION(S) / ED DIAGNOSES  ? ?Final diagnoses:  ?Pseudoaneurysm ([place])  ?Right leg pain  ?Right leg swelling  ? ? ? ?Rx / DC Orders  ? ?ED  Discharge Orders   ? ? None  ? ?  ? ? ? ?Note:  This document was prepared using Dragon voice recognition software and may include unintentional dictation errors. ?  ?Rada Hay, MD ?02/15/22 1816 ? ?

## 2022-02-15 NOTE — ED Notes (Signed)
Pt given menu to order dinner.  ?

## 2022-02-15 NOTE — ED Triage Notes (Addendum)
Pt c/o continued swelling and soreness to the right upper thigh where pt had catheterization on 3/10. Pt was seen for same on Friday and discharged. Pt reports symptoms worse today.  ?

## 2022-02-16 DIAGNOSIS — I480 Paroxysmal atrial fibrillation: Secondary | ICD-10-CM | POA: Diagnosis not present

## 2022-02-16 DIAGNOSIS — D62 Acute posthemorrhagic anemia: Secondary | ICD-10-CM | POA: Diagnosis not present

## 2022-02-16 LAB — BASIC METABOLIC PANEL
Anion gap: 7 (ref 5–15)
BUN: 25 mg/dL — ABNORMAL HIGH (ref 8–23)
CO2: 27 mmol/L (ref 22–32)
Calcium: 8.3 mg/dL — ABNORMAL LOW (ref 8.9–10.3)
Chloride: 106 mmol/L (ref 98–111)
Creatinine, Ser: 0.77 mg/dL (ref 0.61–1.24)
GFR, Estimated: >60 mL/min (ref >60)
Glucose, Bld: 114 mg/dL — ABNORMAL HIGH (ref 70–99)
Potassium: 3.7 mmol/L (ref 3.5–5.1)
Sodium: 140 mmol/L (ref 135–145)

## 2022-02-16 LAB — CBC
HCT: 28.8 % — ABNORMAL LOW (ref 39.0–52.0)
Hemoglobin: 9.3 g/dL — ABNORMAL LOW (ref 13.0–17.0)
MCH: 29.6 pg (ref 26.0–34.0)
MCHC: 32.3 g/dL (ref 30.0–36.0)
MCV: 91.7 fL (ref 80.0–100.0)
Platelets: 149 10*3/uL — ABNORMAL LOW (ref 150–400)
RBC: 3.14 MIL/uL — ABNORMAL LOW (ref 4.22–5.81)
RDW: 15.1 % (ref 11.5–15.5)
WBC: 8.2 10*3/uL (ref 4.0–10.5)
nRBC: 0 % (ref 0.0–0.2)

## 2022-02-16 NOTE — Discharge Instructions (Signed)
Follow-up Dr. Nehemiah Massed in one week. ?Your skin on the right eye has been marked date has been placed for today March 13 continue to monitor any increase beyond the marking line. Call Dr Alveria Apley office with significant pain or increase in bruising ?

## 2022-02-16 NOTE — Discharge Summary (Signed)
Physician Discharge Summary   Patient: [name]. MRN: [id number] DOB: [birth year]  Admit date:     02/15/2022  Discharge date: 02/16/22  Discharge Physician: Fritzi Mandes   PCP: Adin Hector, MD   Recommendations at discharge:   follow-up Dr. Nehemiah Massed and one week  Discharge Diagnoses: Right Thigh ecchymosis after recent cardiac cath  Hospital Course:  Collin Mallek. is a 81 y.o. male with history of CAD, sleep apnea, pulmonary fibrosis with pulmonary hypertension, atrial fibrillation who has had a cardiac cath on Thursday following which patient has noted increasing swelling on his right thigh.  Patient had come to the ER the following day follow-up with cardiology had a sonogram done which did not show any pseudoaneurysm.  The following day patient started taking Eliquis.  He noticed worsening swelling and pain and decided to come to the ER.  Right thigh ecchymosis after recent cardiac cath with history of anticoagulation/eliquis acute on chronic blood loss anemia secondary to large ecchymosis -- patient was kept for overnight observation. -- Hemoglobin 10.5-- 10.1-- 9.3-- 9.3 -- per patient's swelling looks better. -- Skin marking done for March 13. Patient will continue to monitor at home. -- Patient advised to hold eliquis till seen by Dr. Nehemiah Massed next week -- seen by Dr. Nehemiah Massed okay to go home -- resumed other cardiac meds  history of a fib on Cardizem and metoprolol -- hold eliquis for now  history of pulmonary fibrosis and pulmonary hypertension -- follows with Dr.Aleskerov --on Pirfenidone  history of sleep apnea on CPAP and oxygen at bedtime  Hypertension -- continue above meds  overall hemodynamically stable. Okay to discharge from cardiology standpoint. Patient in agreement      Consultants: dr Nehemiah Massed Procedures performed: none  Disposition: Home Diet recommendation:  Discharge Diet Orders (From admission, onward)     Start      Ordered   02/16/22 0000  Diet - low sodium heart healthy        02/16/22 1237           Cardiac diet DISCHARGE MEDICATION: Allergies as of 02/16/2022       Reactions   Fish Allergy Hives   Tramadol Other (See Comments)   confusion   Hydrochlorothiazide Other (See Comments)   worsens gout   Iodine Rash   Shellfish Allergy Rash        Medication List     TAKE these medications    acetaminophen 650 MG CR tablet Commonly known as: TYLENOL Take 1,300 mg by mouth every 8 (eight) hours as needed for pain.   albuterol 108 (90 Base) MCG/ACT inhaler Commonly known as: VENTOLIN HFA Inhale 1-2 puffs into the lungs every 6 (six) hours as needed for wheezing or shortness of breath.   ALPRAZolam 0.25 MG tablet Commonly known as: XANAX Take 0.25 mg by mouth 3 (three) times daily as needed for anxiety.   atorvastatin 40 MG tablet Commonly known as: LIPITOR Take 40 mg by mouth daily.   diltiazem 120 MG 24 hr capsule Commonly known as: CARDIZEM CD Take 120 mg by mouth 2 (two) times daily.   furosemide 40 MG tablet Commonly known as: LASIX Take 40 mg by mouth daily.   HYDROcodone-acetaminophen 5-325 MG tablet Commonly known as: Norco Take 0.5 tablets by mouth every 6 (six) hours as needed for moderate pain.   levothyroxine 25 MCG tablet Commonly known as: SYNTHROID Take 25 mcg by mouth every morning.   metoprolol succinate 50 MG  24 hr tablet Commonly known as: TOPROL-XL Take 50 mg by mouth daily.   multivitamin tablet Take 1 tablet by mouth daily. Centrum for men   Pirfenidone 801 MG Tabs Take 801 mg by mouth 3 (three) times daily.   telmisartan 40 MG tablet Commonly known as: MICARDIS Take 40 mg by mouth daily.        Follow-up Information     [name], MD. Go in 1 week(s).   Specialty: Cardiology Contact information: [facility] [place] [address] [phone]         Tama High III, MD.  Schedule an appointment as soon as possible for a visit in 1 week(s).   Specialty: Internal Medicine Why: hosp f/u Contact information: [place] [address] [phone]                Discharge Exam: Danley Danker Weights   02/15/22 2100  Weight: 108.4 kg      Condition at discharge: fair  The results of significant diagnostics from this hospitalization (including imaging, microbiology, ancillary and laboratory) are listed below for reference.   Imaging Studies: CARDIAC CATHETERIZATION  Result Date: 02/12/2022   Prox RCA lesion is 40% stenosed.   Mid RCA lesion is 60% stenosed.   Dist RCA lesion is 40% stenosed.   Prox LAD to Mid LAD lesion is 100% stenosed.   Mid LM lesion is 40% stenosed.   Prox Cx lesion is 45% stenosed.   The left ventricular systolic function is normal.   LV end diastolic pressure is mildly elevated.   The left ventricular ejection fraction is greater than 65% by visual estimate.   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 81 year old male with a significant progression of severe shortness of breath with normal LV systolic function by echocardiogram and ejection fraction of 65% with history of previous atrial myxoma. Normal LV systolic function ejection fraction 65% Moderate atherosclerosis left circumflex and right coronary artery Occluded ostial LAD without collaterals Right heart catheterization Pulmonary artery pressures were 62/26 mm with a mean of 44 mm Right ventricle pressures were 66 over 1 mm with a mean of 10 mm Right atrium was 14/4 with a mean of 9 mm Plan Continued aggressive medical management for diffuse coronary artery disease not amenable to PCI and stent placement Continuation of treatment for pulmonary hypertension moderate by pulmonary pressures today Continue hypertension control and Cardiac rehabilitation   [place] Lower Ext Art Right [employer]  Result Date: 02/15/2022 CLINICAL DATA:  Status post  catheterization, evaluate for pseudoaneurysm EXAM: LIMITED RIGHT LOWER EXTREMITY ARTERIAL DUPLEX SCAN TECHNIQUE: Gray-scale sonography as well as color Doppler and duplex ultrasound was performed to evaluate the lower extremity arteries including the common, superficial and profunda femoral arteries, popliteal artery and calf arteries. COMPARISON:  None. FINDINGS: Targeted ultrasound examination of the right groin at the site of vascular access reveals no evidence of pseudoaneurysm or other abnormality of the common femoral artery or common femoral vein. IMPRESSION: No evidence of pseudoaneurysm or other ultrasound abnormality of the right groin. Electronically Signed   By: Delanna Ahmadi M.D.   On: 02/15/2022 16:50    Microbiology: Results for orders placed or performed during the hospital encounter of 02/15/22  Resp Panel by RT-PCR (Flu A&B, Covid) Nasopharyngeal Swab     Status: None   Collection Time: 02/15/22  5:38 PM   Specimen: Nasopharyngeal Swab; Nasopharyngeal(NP) swabs in vial transport medium  Result Value Ref Range Status  SARS Coronavirus 2 by RT PCR NEGATIVE NEGATIVE Final    Comment: (NOTE) SARS-CoV-2 target nucleic acids are NOT DETECTED.  The SARS-CoV-2 RNA is generally detectable in upper respiratory specimens during the acute phase of infection. The lowest concentration of SARS-CoV-2 viral copies this assay can detect is 138 copies/mL. A negative result does not preclude SARS-Cov-2 infection and should not be used as the sole basis for treatment or other patient management decisions. A negative result may occur with  improper specimen collection/handling, submission of specimen other than nasopharyngeal swab, presence of viral mutation(s) within the areas targeted by this assay, and inadequate number of viral copies(<138 copies/mL). A negative result must be combined with clinical observations, patient history, and epidemiological information. The expected result is  Negative.  Fact Sheet for Patients:  EntrepreneurPulse.com.au  Fact Sheet for Healthcare Providers:  IncredibleEmployment.be  This test is no t yet approved or cleared by the [place] FDA and  has been authorized for detection and/or diagnosis of SARS-CoV-2 by FDA under an Emergency Use Authorization (EUA). This EUA will remain  in effect (meaning this test can be used) for the duration of the COVID-19 declaration under Section 564(b)(1) of the Act, 21 U.S.C.section 360bbb-3(b)(1), unless the authorization is terminated  or revoked sooner.       Influenza A by PCR NEGATIVE NEGATIVE Final   Influenza B by PCR NEGATIVE NEGATIVE Final    Comment: (NOTE) The Xpert Xpress SARS-CoV-2/FLU/RSV plus assay is intended as an aid in the diagnosis of influenza from Nasopharyngeal swab specimens and should not be used as a sole basis for treatment. Nasal washings and aspirates are unacceptable for Xpert Xpress SARS-CoV-2/FLU/RSV testing.  Fact Sheet for Patients: EntrepreneurPulse.com.au  Fact Sheet for Healthcare Providers: IncredibleEmployment.be  This test is not yet approved or cleared by the [place] FDA and has been authorized for detection and/or diagnosis of SARS-CoV-2 by FDA under an Emergency Use Authorization (EUA). This EUA will remain in effect (meaning this test can be used) for the duration of the COVID-19 declaration under Section 564(b)(1) of the Act, 21 U.S.C. section 360bbb-3(b)(1), unless the authorization is terminated or revoked.  Performed at [facility], [address] [place], [place] [address]     Labs: CBC: Recent Labs  Lab 02/15/22 1542 02/15/22 1915 02/15/22 2206 02/16/22 0311  WBC 8.9 9.2 8.3 8.2  NEUTROABS 5.9  --   --   --   HGB 10.5* 10.1* 9.3* 9.3*  HCT 33.0* 32.5* 28.7* 28.8*  MCV 93.0 94.8 91.1 91.7  PLT 167 156 138* 112*   Basic Metabolic  Panel: Recent Labs  Lab 02/15/22 1542 02/16/22 0311  NA 141 140  K 3.9 3.7  CL 105 106  CO2 29 27  GLUCOSE 108* 114*  BUN 26* 25*  CREATININE 0.97 0.77  CALCIUM 8.7* 8.3*   Liver Function Tests: No results for input(s): AST, ALT, ALKPHOS, BILITOT, PROT, ALBUMIN in the last 168 hours. CBG: No results for input(s): GLUCAP in the last 168 hours.  Discharge time spent: greater than 30 minutes.  Signed: Fritzi Mandes, MD [facility] 02/16/2022

## 2022-02-16 NOTE — TOC Initial Note (Signed)
Transition of Care (TOC) - Initial/Assessment Note  ? ? ?Patient Details  ?Name: [name]. ?MRN: [id number] ?Date of Birth: 1941-04-30 ? ?Transition of Care (TOC) CM/SW Contact:    ?Conception Oms, RN ?Phone Number: ?02/16/2022, 9:12 AM ? ?Clinical Narrative:     ?Transition of Care (TOC) Screening Note ? ? ?Patient Details  ?Name: [name]. ?Date of Birth: Oct 21, 1941 ? ? ?Transition of Care (TOC) CM/SW Contact:    ?Conception Oms, RN ?Phone Number: ?02/16/2022, 9:12 AM ? ? ? ?Transition of Care Department [facility]) has reviewed patient and no TOC needs have been identified at this time. We will continue to monitor patient advancement through interdisciplinary progression rounds. If new patient transition needs arise, please place a TOC consult. ?              ? ? ?  ?  ? ? ?Patient Goals and CMS Choice ?  ?  ?  ? ?Expected Discharge Plan and Services ?  ?  ?  ?  ?  ?                ?  ?  ?  ?  ?  ?  ?  ?  ?  ?  ? ?Prior Living Arrangements/Services ?  ?  ?  ?       ?  ?  ?  ?  ? ?Activities of Daily Living ?Home Assistive Devices/Equipment: None ?ADL Screening (condition at time of admission) ?Patient's cognitive ability adequate to safely complete daily activities?: Yes ?Is the patient deaf or have difficulty hearing?: Yes (right ear hearing aide. did not bring) ?Does the patient have difficulty seeing, even when wearing glasses/contacts?: No ?Does the patient have difficulty concentrating, remembering, or making decisions?: No ?Patient able to express need for assistance with ADLs?: Yes ?Does the patient have difficulty dressing or bathing?: No ?Independently performs ADLs?: Yes (appropriate for developmental age) ?Does the patient have difficulty walking or climbing stairs?: No ?Weakness of Legs: None ?Weakness of Arms/Hands: None ? ?Permission Sought/Granted ?  ?  ?   ?   ?   ?   ? ?Emotional Assessment ?  ?  ?  ?  ?  ?  ? ?Admission diagnosis:  Pseudoaneurysm ([place]) [I72.9] ?Right leg pain  [M79.604] ?Right leg swelling [M79.89] ?ABLA (acute blood loss anemia) [D62] ?Patient Active Problem List  ? Diagnosis Date Noted  ? ABLA (acute blood loss anemia) 02/15/2022  ? Right leg swelling   ? Angina pectoris ([place]) 02/03/2022  ? 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 Cellulitis of leg, left 11/07/2018  ? Lymphedema 08/17/2018  ? Carotid stenosis 08/14/2018  ? CAD (coronary artery disease) 08/12/2018  ? COPD (chronic obstructive pulmonary disease) ([place]) 08/12/2018  ? Gout 08/12/2018  ? Hyperglycemia 08/12/2018  ? Mixed hyperlipidemia 08/12/2018  ? Peripheral vascular disease ([place]) 08/12/2018  ? Bilateral leg edema 07/12/2018  ? SOBOE (shortness of breath on exertion) 07/12/2018  ? Left wrist pain 10/07/2017  ? Morbid obesity ([place]) 10/07/2017  ? Radial styloid tenosynovitis ([place]) 10/07/2017  ? Senile purpura ([place]) 09/20/2017  ? 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 Pulmonary fibrosis ([place]) 03/13/2016  ? 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 Essential tremor 11/05/2015  ? Persistent atrial fibrillation ([place]) 09/30/2015  ? Benign essential hypertension 05/10/2015  ? 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  ? Right knee pain 02/26/2015  ?  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/12/2015  ? OSA (obstructive sleep apnea) 02/12/2015  ? ?PCP:  Adin Hector, MD ?Pharmacy:   ?[facility] [place] [facility] [address] [facility] ?[place] [address] ?Phone: [phone] Fax: [phone] ? ? ? ? ?Social Determinants of Health (SDOH) Interventions ?  ? ?Readmission Risk Interventions ?No flowsheet data found. ? ? ?

## 2022-02-16 NOTE — Progress Notes (Addendum)
[facility] Cardiology  CARDIOLOGY CONSULT NOTE  Patient ID: [name]. MRN: [id number] DOB/AGE: Feb 20, 1941 81 y.o.  Admit date: 02/15/2022 Referring Physician Acquanetta Belling Primary Physician Adin Hector, MD Primary Cardiologist Serafina Royals Reason for Consultation Thigh hematoma following cath  HPI:  [name] is an 81 year old male with history of CAD (one-vessel CABG-LIMA to LAD), hypertension, OSA, pulmonary fibrosis, atrial fibrillation who presents with right leg and thigh swelling following heart catheterization on Thursday 3/9.  Interval History:  - Hgb since admission trending 10.5 - 10.1 - 9.3 - 9.3. Vitals stable.  - feels like the bruising on his R thigh has not spread further, he thinks leg feels a little softer today - no chest pain, sob, palpitations.   Review of systems complete and found to be negative unless listed above     Past Medical History:  Diagnosis Date   Allergy    Arthritis    Coronary artery disease    COVID-19 11/19/2019   Dysrhythmia    Hypertension    Peripheral vascular disease ([place])    Pulmonary fibrosis ([place])    Sleep apnea    CPAP   Tremor     Past Surgical History:  Procedure Laterality Date   CARDIAC SURGERY     CARDIOVERSION N/A 03/03/2018   Procedure: CARDIOVERSION;  Surgeon: [name], MD;  Location: [facility];  Service: Cardiovascular;  Laterality: N/A;   CARDIOVERSION N/A 05/24/2018   Procedure: CARDIOVERSION;  Surgeon: [name], MD;  Location: [facility];  Service: Cardiovascular;  Laterality: N/A;   CATARACT EXTRACTION W/PHACO Right 06/03/2020   Procedure: CATARACT EXTRACTION PHACO AND INTRAOCULAR LENS PLACEMENT ([place]) RIGHT 6.07  00:48.0;  Surgeon: Eulogio Bear, MD;  Location: [facility];  Service: Ophthalmology;  Laterality: Right;  sleep apnea   CORONARY ARTERY BYPASS GRAFT     EYE SURGERY     LEG SURGERY Left    stents placed, Fem-Fem bypass   RIGHT/LEFT HEART CATH AND CORONARY  ANGIOGRAPHY Bilateral 02/12/2022   Procedure: RIGHT/LEFT HEART CATH AND CORONARY ANGIOGRAPHY;  Surgeon: [name], MD;  Location: [facility];  Service: Cardiovascular;  Laterality: Bilateral;   TONSILLECTOMY      Medications Prior to Admission  Medication Sig Dispense Refill Last Dose   acetaminophen (TYLENOL) 650 MG CR tablet Take 1,300 mg by mouth every 8 (eight) hours as needed for pain.       albuterol (VENTOLIN HFA) 108 (90 Base) MCG/ACT inhaler Inhale 1-2 puffs into the lungs every 6 (six) hours as needed for wheezing or shortness of breath. 18 g 0    ALPRAZolam (XANAX) 0.25 MG tablet Take 0.25 mg by mouth 3 (three) times daily as needed for anxiety.  5    atorvastatin (LIPITOR) 40 MG tablet Take 40 mg by mouth daily.      diltiazem (CARDIZEM CD) 120 MG 24 hr capsule Take 120 mg by mouth 2 (two) times daily.      furosemide (LASIX) 40 MG tablet Take 40 mg by mouth daily.      HYDROcodone-acetaminophen (NORCO) 5-325 MG tablet Take 0.5 tablets by mouth every 6 (six) hours as needed for moderate pain. 5 tablet 0    levothyroxine (SYNTHROID) 25 MCG tablet Take 25 mcg by mouth every morning.      metoprolol succinate (TOPROL-XL) 50 MG 24 hr tablet Take 50 mg by mouth daily.      Multiple Vitamin (MULTIVITAMIN) tablet Take 1 tablet by mouth  daily. Centrum for men      Pirfenidone 801 MG TABS Take 801 mg by mouth 3 (three) times daily.      telmisartan (MICARDIS) 40 MG tablet Take 40 mg by mouth daily.       Social History   Socioeconomic History   Marital status: Married    Spouse name: Not on file   Number of children: Not on file   Years of education: Not on file   Highest education level: Not on file  Occupational History   Not on file  Tobacco Use   Smoking status: Former    Types: Cigarettes    Quit date: 02/16/1979    Years since quitting: 43.0   Smokeless tobacco: Never  Vaping Use   Vaping Use: Never used  Substance and Sexual Activity   Alcohol use: Yes     Comment: rarely   Drug use: No   Sexual activity: Not on file  Other Topics Concern   Not on file  Social History Narrative   Not on file   Social Determinants of Health   Financial Resource Strain: Not on file  Food Insecurity: Not on file  Transportation Needs: Not on file  Physical Activity: Not on file  Stress: Not on file  Social Connections: Not on file  Intimate Partner Violence: Not on file    Family History  Problem Relation Age of Onset   Thyroid disease Mother    Macular degeneration Mother    Cancer Father       Review of systems complete and found to be negative unless listed above      PHYSICAL EXAM  General: Pleasant elderly caucasian male, laying flat in hospital bed. Well developed, well nourished, in no acute distress HEENT:  Normocephalic and atramatic Neck:  No JVD.  Lungs: Clear bilaterally to auscultation. Heart: HRRR . Normal S1 and S2 without gallops or murmurs.  Abdomen: non-distended appearing.  Msk: Normal strength and tone for age. Extremities: Significant bruising and swelling of the right leg from the femoral artery down to his knee.  The thigh is tight compared to the left side. Mild tenderness to palpation.  Neuro: Alert and oriented X 3. Psych:  Good affect, responds appropriately  photo 02/15/22   Labs:   Lab Results  Component Value Date   WBC 8.2 02/16/2022   HGB 9.3 (L) 02/16/2022   HCT 28.8 (L) 02/16/2022   MCV 91.7 02/16/2022   PLT 149 (L) 02/16/2022    Recent Labs  Lab 02/16/22 0311  NA 140  K 3.7  CL 106  CO2 27  BUN 25*  CREATININE 0.77  CALCIUM 8.3*  GLUCOSE 114*    Lab Results  Component Value Date   CKTOTAL 1,056 (H) 01/20/2012   CKMB 10.0 (H) 01/20/2012   TROPONINI 0.04 01/20/2012    No results found for: CHOL No results found for: HDL No results found for: LDLCALC No results found for: TRIG No results found for: CHOLHDL No results found for: LDLDIRECT    Radiology: CARDIAC  CATHETERIZATION  Result Date: 02/12/2022   Prox RCA lesion is 40% stenosed.   Mid RCA lesion is 60% stenosed.   Dist RCA lesion is 40% stenosed.   Prox LAD to Mid LAD lesion is 100% stenosed.   Mid LM lesion is 40% stenosed.   Prox Cx lesion is 45% stenosed.   The left ventricular systolic function is normal.   LV end diastolic pressure is mildly elevated.  The left ventricular ejection fraction is greater than 65% by visual estimate.   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 81 year old male with a significant progression of severe shortness of breath with normal LV systolic function by echocardiogram and ejection fraction of 65% with history of previous atrial myxoma. Normal LV systolic function ejection fraction 65% Moderate atherosclerosis left circumflex and right coronary artery Occluded ostial LAD without collaterals Right heart catheterization Pulmonary artery pressures were 62/26 mm with a mean of 44 mm Right ventricle pressures were 66 over 1 mm with a mean of 10 mm Right atrium was 14/4 with a mean of 9 mm Plan Continued aggressive medical management for diffuse coronary artery disease not amenable to PCI and stent placement Continuation of treatment for pulmonary hypertension moderate by pulmonary pressures today Continue hypertension control and Cardiac rehabilitation   [place] Lower Ext Art Right Ltd  Result Date: 02/15/2022 CLINICAL DATA:  Status post catheterization, evaluate for pseudoaneurysm EXAM: LIMITED RIGHT LOWER EXTREMITY ARTERIAL DUPLEX SCAN TECHNIQUE: Gray-scale sonography as well as color Doppler and duplex ultrasound was performed to evaluate the lower extremity arteries including the common, superficial and profunda femoral arteries, popliteal artery and calf arteries. COMPARISON:  None. FINDINGS: Targeted ultrasound examination of the right groin at the site of vascular access reveals no evidence of pseudoaneurysm or other abnormality of the common femoral  artery or common femoral vein. IMPRESSION: No evidence of pseudoaneurysm or other ultrasound abnormality of the right groin. Electronically Signed   By: Delanna Ahmadi M.D.   On: 02/15/2022 16:50    EKG: None performed.   ASSESSMENT AND PLAN:  Kailash Hinze is an 81 year old male with history of CAD (one-vessel CABG-LIMA to LAD), hypertension, OSA, pulmonary fibrosis who presents with right leg and thigh swelling following heart catheterization on Thursday.  # Right thigh hematoma following LHC  # Atrial fibrillation on eliquis Patient is presenting with swelling of his right thigh following heart catheterization on 02/12/2022.  Ultrasound is negative for pseudoaneurysm, but he does have impressive swelling of his right  thigh above the knee in comparison to his left leg.  His hemoglobin has decreased nearly 4 points as well from his prior to heart catheterization level. Hgb dropped another point overnight. Patient feels like his leg is a little less tight today.  -Hgb trended 10.5 - 10.1 - 9.3 - 9.3 this morning.  - continue to hold eliquis until he sees Dr. Nehemiah Massed in the outpatient setting - needs close follow up in 1 week.  -Monitor vital signs closely. -Does not appear to have signs or symptoms suggestive of a retroperitoneal hematoma, so I do not recommend any imaging of his abdomen at this time.  This patient's plan of care was discussed and created with Dr. Serafina Royals and he is in agreement.    Signed: Alanson Puls Tang PA-C 02/16/2022, 11:45 AM  The patient has been interviewed and examined. I agree with assessment and plan above. Serafina Royals MD [facility]

## 2022-02-19 ENCOUNTER — Ambulatory Visit
Admission: RE | Admit: 2022-02-19 | Discharge: 2022-02-19 | Disposition: A | Discharge status: Home / Self Care | Payer: PPO | Source: Ambulatory Visit | Attending: Internal Medicine | Admitting: Internal Medicine

## 2022-02-19 ENCOUNTER — Other Ambulatory Visit: Payer: Self-pay

## 2022-02-19 ENCOUNTER — Ambulatory Visit: Admission: RE | Admit: 2022-02-19 | Payer: PPO | Source: Ambulatory Visit

## 2022-02-19 ENCOUNTER — Other Ambulatory Visit: Payer: Self-pay | Admitting: Internal Medicine

## 2022-02-19 DIAGNOSIS — Z86718 Personal history of other venous thrombosis and embolism: Secondary | ICD-10-CM | POA: Diagnosis not present

## 2022-02-19 DIAGNOSIS — M7989 Other specified soft tissue disorders: Secondary | ICD-10-CM | POA: Insufficient documentation

## 2022-02-19 DIAGNOSIS — R600 Localized edema: Secondary | ICD-10-CM | POA: Diagnosis not present

## 2022-02-23 DIAGNOSIS — I97410 Intraoperative hemorrhage and hematoma of a circulatory system organ or structure complicating a cardiac catheterization: Secondary | ICD-10-CM | POA: Diagnosis not present

## 2022-02-23 DIAGNOSIS — I25708 Atherosclerosis of coronary artery bypass graft(s), unspecified, with other forms of angina pectoris: Secondary | ICD-10-CM | POA: Diagnosis not present

## 2022-02-23 DIAGNOSIS — I739 Peripheral vascular disease, unspecified: Secondary | ICD-10-CM | POA: Diagnosis not present

## 2022-02-23 DIAGNOSIS — S8011XA Contusion of right lower leg, initial encounter: Secondary | ICD-10-CM | POA: Diagnosis not present

## 2022-02-23 DIAGNOSIS — I4819 Other persistent atrial fibrillation: Secondary | ICD-10-CM | POA: Diagnosis not present

## 2022-02-23 DIAGNOSIS — I10 Essential (primary) hypertension: Secondary | ICD-10-CM | POA: Diagnosis not present

## 2022-02-25 DIAGNOSIS — I6523 Occlusion and stenosis of bilateral carotid arteries: Secondary | ICD-10-CM | POA: Diagnosis not present

## 2022-02-25 DIAGNOSIS — I10 Essential (primary) hypertension: Secondary | ICD-10-CM | POA: Diagnosis not present

## 2022-02-25 DIAGNOSIS — I25810 Atherosclerosis of coronary artery bypass graft(s) without angina pectoris: Secondary | ICD-10-CM | POA: Diagnosis not present

## 2022-02-25 DIAGNOSIS — E782 Mixed hyperlipidemia: Secondary | ICD-10-CM | POA: Diagnosis not present

## 2022-02-25 DIAGNOSIS — I739 Peripheral vascular disease, unspecified: Secondary | ICD-10-CM | POA: Diagnosis not present

## 2022-02-26 ENCOUNTER — Ambulatory Visit (INDEPENDENT_AMBULATORY_CARE_PROVIDER_SITE_OTHER): Payer: PPO | Admitting: Vascular Surgery

## 2022-02-26 ENCOUNTER — Other Ambulatory Visit: Payer: Self-pay

## 2022-02-26 ENCOUNTER — Other Ambulatory Visit (INDEPENDENT_AMBULATORY_CARE_PROVIDER_SITE_OTHER): Payer: Self-pay | Admitting: Vascular Surgery

## 2022-02-26 ENCOUNTER — Ambulatory Visit (INDEPENDENT_AMBULATORY_CARE_PROVIDER_SITE_OTHER): Payer: PPO

## 2022-02-26 ENCOUNTER — Encounter (INDEPENDENT_AMBULATORY_CARE_PROVIDER_SITE_OTHER): Payer: Self-pay | Admitting: Vascular Surgery

## 2022-02-26 VITALS — BP 156/76 | HR 80 | Resp 16 | Wt 234.0 lb

## 2022-02-26 DIAGNOSIS — I724 Aneurysm of artery of lower extremity: Secondary | ICD-10-CM

## 2022-02-26 DIAGNOSIS — S8011XS Contusion of right lower leg, sequela: Secondary | ICD-10-CM

## 2022-02-26 DIAGNOSIS — I25118 Atherosclerotic heart disease of native coronary artery with other forms of angina pectoris: Secondary | ICD-10-CM | POA: Diagnosis not present

## 2022-02-26 DIAGNOSIS — S301XXA Contusion of abdominal wall, initial encounter: Secondary | ICD-10-CM | POA: Diagnosis not present

## 2022-02-26 DIAGNOSIS — I10 Essential (primary) hypertension: Secondary | ICD-10-CM | POA: Diagnosis not present

## 2022-02-26 DIAGNOSIS — I6523 Occlusion and stenosis of bilateral carotid arteries: Secondary | ICD-10-CM

## 2022-02-26 DIAGNOSIS — I4819 Other persistent atrial fibrillation: Secondary | ICD-10-CM

## 2022-02-26 NOTE — Progress Notes (Signed)
? ? ?MRN : [id number] ? ?[name]. is a 81 y.o. (07/31/1941) male who presents with chief complaint of check groin. ? ?History of Present Illness:  ?The patient is seen earlier than expected for evaluation of a hematoma s/p cardiac cath on 02/12/2022. ? ?Post cath he developed significant black and blue extending down to his ankle.  It is very painful. ? ?Duplex ultrasound of the right groin today is negative for pseudoaneurysm. ? ?No outpatient medications have been marked as taking for the 02/26/22 encounter (Appointment) with Delana Meyer, Dolores Lory, MD.  ? ? ?Past Medical History:  ?Diagnosis Date  ? Allergy   ? Arthritis   ? Coronary artery disease   ? COVID-19 11/19/2019  ? Dysrhythmia   ? Hypertension   ? Peripheral vascular disease ([place])   ? Pulmonary fibrosis ([place])   ? Sleep apnea   ? CPAP  ? Tremor   ? ? ?Past Surgical History:  ?Procedure Laterality Date  ? CARDIAC SURGERY    ? CARDIOVERSION N/A 03/03/2018  ? Procedure: CARDIOVERSION;  Surgeon: [name], MD;  Location: [facility];  Service: Cardiovascular;  Laterality: N/A;  ? CARDIOVERSION N/A 05/24/2018  ? Procedure: CARDIOVERSION;  Surgeon: [name], MD;  Location: [facility];  Service: Cardiovascular;  Laterality: N/A;  ? CATARACT EXTRACTION W/PHACO Right 06/03/2020  ? Procedure: CATARACT EXTRACTION PHACO AND INTRAOCULAR LENS PLACEMENT (IOC) RIGHT 6.07  00:48.0;  Surgeon: Eulogio Bear, MD;  Location: [place];  Service: Ophthalmology;  Laterality: Right;  sleep apnea  ? CORONARY ARTERY BYPASS GRAFT    ? EYE SURGERY    ? LEG SURGERY Left   ? stents placed, Fem-Fem bypass  ? RIGHT/LEFT HEART CATH AND CORONARY ANGIOGRAPHY Bilateral 02/12/2022  ? Procedure: RIGHT/LEFT HEART CATH AND CORONARY ANGIOGRAPHY;  Surgeon: [name], MD;  Location: [place] [facility];  Service: Cardiovascular;  Laterality: Bilateral;  ? TONSILLECTOMY    ? ? ?Social History ?Social History  ? ?Tobacco Use  ? Smoking status: Former  ?  Types:  Cigarettes  ?  Quit date: 02/16/1979  ?  Years since quitting: 43.0  ? Smokeless tobacco: Never  ?Vaping Use  ? Vaping Use: Never used  ?Substance Use Topics  ? Alcohol use: Yes  ?  Comment: rarely  ? Drug use: No  ? ? ?Family History ?Family History  ?Problem Relation Age of Onset  ? Thyroid disease Mother   ? Macular degeneration Mother   ? Cancer Father   ? ? ?Allergies  ?Allergen Reactions  ? Fish Allergy Hives  ? Tramadol Other (See Comments)  ?  confusion  ? Hydrochlorothiazide Other (See Comments)  ?  worsens gout  ? Iodine Rash  ? Shellfish Allergy Rash  ? ? ? ?REVIEW OF SYSTEMS (Negative unless checked) ? ?Constitutional: _0 Weight loss  _1 Fever  _2 Chills ?Cardiac: _3 Chest pain   _4 Chest pressure   _5 Palpitations   _6 Shortness of breath when laying flat   _7 Shortness of breath with exertion. ?Vascular:  _8 Pain in legs with walking   _9 Pain in legs at rest  _10 History of DVT   _11 Phlebitis   _12 Swelling in legs   _13 Varicose veins   _14 Non-healing ulcers ?Pulmonary:   _15 Uses home oxygen   _16 Productive cough   _17 Hemoptysis   _18 Wheeze  _19 COPD   _20 Asthma ?Neurologic:  _21 Dizziness   _22 Seizures   _23 History of stroke   _24 History of TIA  _25 Aphasia   _26 Vissual changes   _27 Weakness or numbness in arm   _28   Weakness or numbness in leg ?Musculoskeletal:   _0 Joint swelling   _1 Joint pain   _2 Low back pain ?Hematologic:  _3 Easy bruising  _4 Easy bleeding   _5 Hypercoagulable state   _6 Anemic ?Gastrointestinal:  _7 Diarrhea   _8 Vomiting  _9 Gastroesophageal reflux/heartburn   _10 Difficulty swallowing. ?Genitourinary:  _11 Chronic kidney disease   _12 Difficult urination  _13 Frequent urination   _14 Blood in urine ?Skin:  _15 Rashes   _16 Ulcers  ?Psychological:  _17 History of anxiety   _18  History of major depression. ? ?Physical Examination ? ?There were no vitals filed for this visit. ?There is no height or weight on file to calculate BMI. ?Gen: WD/WN, NAD ?Head: [place]/AT, No temporalis wasting.  ?Ear/Nose/Throat: Hearing grossly intact, nares w/o  erythema or drainage ?Eyes: PER, EOMI, sclera nonicteric.  ?Neck: Supple, no masses.  No bruit or JVD.  ?Pulmonary:  Good air movement, no audible wheezing, no use of accessory muscles.  ?Cardiac: RRR, normal S1, S2, no Murmurs. ?Vascular:  large hematoma ?Vessel Right Left  ?Radial Palpable Palpable  ?Gastrointestinal: soft, non-distended. No guarding/no peritoneal signs.  ?Musculoskeletal: M/S 5/5 throughout.  No visible deformity.  ?Neurologic: CN 2-12 intact. Pain and light touch intact in extremities.  Symmetrical.  Speech is fluent. Motor exam as listed above. ?Psychiatric: Judgment intact, Mood & affect appropriate for pt's clinical situation. ?Dermatologic: No rashes or ulcers noted.  No changes consistent with cellulitis. ? ? ?CBC ?Lab Results  ?Component Value Date  ? WBC 8.2 02/16/2022  ? HGB 9.3 (L) 02/16/2022  ? HCT 28.8 (L) 02/16/2022  ? MCV 91.7 02/16/2022  ? PLT 149 (L) 02/16/2022  ? ? ?BMET ?   ?Component Value Date/Time  ? NA 140 02/16/2022 0311  ? NA 138 01/28/2012 0550  ? K 3.7 02/16/2022 0311  ? K 4.4 01/28/2012 0550  ? CL 106 02/16/2022 0311  ? CL 103 01/28/2012 0550  ? CO2 27 02/16/2022 0311  ? CO2 26 01/28/2012 0550  ? GLUCOSE 114 (H) 02/16/2022 0311  ? GLUCOSE 110 (H) 01/28/2012 0550  ? BUN 25 (H) 02/16/2022 0311  ? BUN 19 (H) 01/28/2012 0550  ? CREATININE 0.77 02/16/2022 0311  ? CREATININE 0.85 01/28/2012 0550  ? CALCIUM 8.3 (L) 02/16/2022 0311  ? CALCIUM 8.7 01/28/2012 0550  ? GFRNONAA >60 02/16/2022 0311  ? GFRNONAA >60 01/28/2012 0550  ? GFRAA >60 01/28/2012 0550  ? ?Estimated Creatinine Clearance: 92.2 mL/min (by C-G formula based on SCr of 0.77 mg/dL). ? ?COAG ?Lab Results  ?Component Value Date  ? INR 1.4 (H) 02/15/2022  ? INR 1.45 09/09/2015  ? INR 1.9 01/29/2012  ? ? ?Radiology ?CARDIAC CATHETERIZATION ? ?Result Date: 02/12/2022 ?  Prox RCA lesion is 40% stenosed.   Mid RCA lesion is 60% stenosed.   Dist RCA lesion is 40% stenosed.   Prox LAD to Mid LAD lesion is 100% stenosed.   Mid  LM lesion is 40% stenosed.   Prox Cx lesion is 45% stenosed.   The left ventricular systolic function is normal.   LV end diastolic pressure is mildly elevated.   The left ventricular ejection fraction is greater than 65% by visual estimate.   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 81 year old male with a significant progression of severe shortness of breath with normal LV systolic function by echocardiogram and ejection fraction of 65% with history of previous atrial myxoma. Normal LV systolic function ejection fraction 65% Moderate atherosclerosis left circumflex and right coronary artery Occluded ostial LAD without collaterals Right heart catheterization Pulmonary artery pressures were  62/26 mm with a mean of 44 mm Right ventricle pressures were 66 over 1 mm with a mean of 10 mm Right atrium was 14/4 with a mean of 9 mm Plan Continued aggressive medical management for diffuse coronary artery disease not amenable to PCI and stent placement Continuation of treatment for pulmonary hypertension moderate by pulmonary pressures today Continue hypertension control and Cardiac rehabilitation  ? ?US Venous Img Lower Unilateral Right (DVT) ? ?Result Date: 02/19/2022 ?CLINICAL DATA:  Right leg swelling since catheterization 02/12/2022. Catheterization of right groin. History of deep venous thrombosis. EXAM: RIGHT LOWER EXTREMITY VENOUS DOPPLER ULTRASOUND TECHNIQUE: Gray-scale sonography with compression, as well as color and duplex ultrasound, were performed to evaluate the deep venous system(s) from the level of the common femoral vein through the popliteal and proximal calf veins. COMPARISON:  None. FINDINGS: There is somewhat limited visualization due to patient edema and bruising. There is a hypoechoic heterogeneous collection within the proximal, medial right thigh superior to the right superficial femoral vein and profunda femoral vein measuring up to approximately 13.0 x 2.7 x 6.0 cm. No  internal color flow vascularity. VENOUS Normal compressibility of the common femoral, superficial femoral, and popliteal veins, as well as the visualized calf veins. Visualized portions of profunda fem

## 2022-03-10 ENCOUNTER — Encounter (INDEPENDENT_AMBULATORY_CARE_PROVIDER_SITE_OTHER): Payer: Self-pay | Admitting: Vascular Surgery

## 2022-03-11 DIAGNOSIS — D62 Acute posthemorrhagic anemia: Secondary | ICD-10-CM | POA: Diagnosis not present

## 2022-03-18 ENCOUNTER — Other Ambulatory Visit: Payer: Self-pay | Admitting: Internal Medicine

## 2022-03-18 ENCOUNTER — Ambulatory Visit
Admission: RE | Admit: 2022-03-18 | Discharge: 2022-03-18 | Disposition: A | Discharge status: Home / Self Care | Payer: PPO | Source: Ambulatory Visit | Attending: Internal Medicine | Admitting: Internal Medicine

## 2022-03-18 DIAGNOSIS — R600 Localized edema: Secondary | ICD-10-CM

## 2022-03-18 DIAGNOSIS — M79604 Pain in right leg: Secondary | ICD-10-CM

## 2022-03-18 DIAGNOSIS — I208 Other forms of angina pectoris: Secondary | ICD-10-CM | POA: Diagnosis not present

## 2022-03-18 DIAGNOSIS — M7989 Other specified soft tissue disorders: Secondary | ICD-10-CM | POA: Diagnosis not present

## 2022-03-18 DIAGNOSIS — M48061 Spinal stenosis, lumbar region without neurogenic claudication: Secondary | ICD-10-CM | POA: Diagnosis not present

## 2022-03-18 DIAGNOSIS — I739 Peripheral vascular disease, unspecified: Secondary | ICD-10-CM | POA: Diagnosis not present

## 2022-03-18 DIAGNOSIS — I4819 Other persistent atrial fibrillation: Secondary | ICD-10-CM | POA: Diagnosis not present

## 2022-03-18 DIAGNOSIS — I724 Aneurysm of artery of lower extremity: Secondary | ICD-10-CM | POA: Diagnosis not present

## 2022-03-18 DIAGNOSIS — I10 Essential (primary) hypertension: Secondary | ICD-10-CM | POA: Diagnosis not present

## 2022-03-25 DIAGNOSIS — J8410 Pulmonary fibrosis, unspecified: Secondary | ICD-10-CM | POA: Diagnosis not present

## 2022-03-25 DIAGNOSIS — R600 Localized edema: Secondary | ICD-10-CM | POA: Diagnosis not present

## 2022-03-25 DIAGNOSIS — D6869 Other thrombophilia: Secondary | ICD-10-CM | POA: Diagnosis not present

## 2022-03-25 DIAGNOSIS — I4819 Other persistent atrial fibrillation: Secondary | ICD-10-CM | POA: Diagnosis not present

## 2022-03-25 DIAGNOSIS — I739 Peripheral vascular disease, unspecified: Secondary | ICD-10-CM | POA: Diagnosis not present

## 2022-03-25 DIAGNOSIS — I10 Essential (primary) hypertension: Secondary | ICD-10-CM | POA: Diagnosis not present

## 2022-03-25 DIAGNOSIS — E782 Mixed hyperlipidemia: Secondary | ICD-10-CM | POA: Diagnosis not present

## 2022-03-25 DIAGNOSIS — I724 Aneurysm of artery of lower extremity: Secondary | ICD-10-CM | POA: Diagnosis not present

## 2022-03-25 DIAGNOSIS — J449 Chronic obstructive pulmonary disease, unspecified: Secondary | ICD-10-CM | POA: Diagnosis not present

## 2022-03-25 DIAGNOSIS — I25810 Atherosclerosis of coronary artery bypass graft(s) without angina pectoris: Secondary | ICD-10-CM | POA: Diagnosis not present

## 2022-03-25 DIAGNOSIS — G4733 Obstructive sleep apnea (adult) (pediatric): Secondary | ICD-10-CM | POA: Diagnosis not present

## 2022-03-30 ENCOUNTER — Ambulatory Visit (INDEPENDENT_AMBULATORY_CARE_PROVIDER_SITE_OTHER): Payer: PPO | Admitting: Vascular Surgery

## 2022-03-30 ENCOUNTER — Encounter (INDEPENDENT_AMBULATORY_CARE_PROVIDER_SITE_OTHER): Payer: Self-pay | Admitting: Vascular Surgery

## 2022-03-30 VITALS — BP 161/83 | HR 62 | Resp 16 | Wt 234.8 lb

## 2022-03-30 DIAGNOSIS — I6523 Occlusion and stenosis of bilateral carotid arteries: Secondary | ICD-10-CM | POA: Diagnosis not present

## 2022-03-30 DIAGNOSIS — I25118 Atherosclerotic heart disease of native coronary artery with other forms of angina pectoris: Secondary | ICD-10-CM

## 2022-03-30 DIAGNOSIS — I10 Essential (primary) hypertension: Secondary | ICD-10-CM | POA: Diagnosis not present

## 2022-03-30 DIAGNOSIS — I724 Aneurysm of artery of lower extremity: Secondary | ICD-10-CM

## 2022-03-30 DIAGNOSIS — S301XXD Contusion of abdominal wall, subsequent encounter: Secondary | ICD-10-CM

## 2022-03-30 NOTE — Progress Notes (Signed)
? ? ? ? ?MRN : [id number] ? ?Collin Procter. is a 81 y.o. (02/01/1941) male who presents with chief complaint of check bruising from cardiac cath. ? ?History of Present Illness:  ? ?The patient is seen earlier than expected for evaluation of a hematoma s/p cardiac cath on 02/12/2022. ?  ?Post cath he developed significant hematoma.  Today he reports it has mostly gone away.  His right leg is no longer particularly painful ?  ?Previous duplex ultrasound of the right groin today is negative for pseudoaneurysm. ? ?No outpatient medications have been marked as taking for the 03/30/22 encounter (Appointment) with Delana Meyer, Dolores Lory, MD.  ? ? ?Past Medical History:  ?Diagnosis Date  ? Allergy   ? Arthritis   ? Coronary artery disease   ? COVID-19 11/19/2019  ? Dysrhythmia   ? Hypertension   ? Peripheral vascular disease ([place])   ? Pulmonary fibrosis ([place])   ? Sleep apnea   ? CPAP  ? Tremor   ? ? ?Past Surgical History:  ?Procedure Laterality Date  ? CARDIAC SURGERY    ? CARDIOVERSION N/A 03/03/2018  ? Procedure: CARDIOVERSION;  Surgeon: [name], MD;  Location: [facility];  Service: Cardiovascular;  Laterality: N/A;  ? CARDIOVERSION N/A 05/24/2018  ? Procedure: CARDIOVERSION;  Surgeon: [name], MD;  Location: [facility];  Service: Cardiovascular;  Laterality: N/A;  ? CATARACT EXTRACTION W/PHACO Right 06/03/2020  ? Procedure: CATARACT EXTRACTION PHACO AND INTRAOCULAR LENS PLACEMENT (IOC) RIGHT 6.07  00:48.0;  Surgeon: Eulogio Bear, MD;  Location: [place];  Service: Ophthalmology;  Laterality: Right;  sleep apnea  ? CORONARY ARTERY BYPASS GRAFT    ? EYE SURGERY    ? LEG SURGERY Left   ? stents placed, Fem-Fem bypass  ? RIGHT/LEFT HEART CATH AND CORONARY ANGIOGRAPHY Bilateral 02/12/2022  ? Procedure: RIGHT/LEFT HEART CATH AND CORONARY ANGIOGRAPHY;  Surgeon: [name], MD;  Location: [place] [facility];  Service: Cardiovascular;  Laterality: Bilateral;  ? TONSILLECTOMY    ? ? ?Social  History ?Social History  ? ?Tobacco Use  ? Smoking status: Former  ?  Types: Cigarettes  ?  Quit date: 02/16/1979  ?  Years since quitting: 43.1  ? Smokeless tobacco: Never  ?Vaping Use  ? Vaping Use: Never used  ?Substance Use Topics  ? Alcohol use: Yes  ?  Comment: rarely  ? Drug use: No  ? ? ?Family History ?Family History  ?Problem Relation Age of Onset  ? Thyroid disease Mother   ? Macular degeneration Mother   ? Cancer Father   ? ? ?Allergies  ?Allergen Reactions  ? Fish Allergy Hives  ? Hydrocodone   ?  Other reaction(s): Other (See Comments) ?confusion  ? Tramadol Other (See Comments)  ?  confusion  ? Hydrochlorothiazide Other (See Comments)  ?  worsens gout  ? Iodine Rash  ? Shellfish Allergy Rash  ? ? ? ?REVIEW OF SYSTEMS (Negative unless checked) ? ?Constitutional: _0 Weight loss  _1 Fever  _2 Chills ?Cardiac: _3 Chest pain   _4 Chest pressure   _5 Palpitations   _6 Shortness of breath when laying flat   _7 Shortness of breath with exertion. ?Vascular:  _8 Pain in legs with walking   _9 Pain in legs at rest  _10 History of DVT   _11 Phlebitis   _12 Swelling in legs   _13 Varicose veins   _14 Non-healing ulcers ?Pulmonary:   _15 Uses home oxygen   _16 Productive cough   _17 Hemoptysis   _18 Wheeze  _19 COPD   _20 Asthma ?Neurologic:  _21   Dizziness   _0 Seizures   _1 History of stroke   _2 History of TIA  _3 Aphasia   _4 Vissual changes   _5 Weakness or numbness in arm   _6 Weakness or numbness in leg ?Musculoskeletal:   _7 Joint swelling   _8 Joint pain   _9 Low back pain ?Hematologic:  _10 Easy bruising  _11 Easy bleeding   _12 Hypercoagulable state   _13 Anemic ?Gastrointestinal:  _14 Diarrhea   _15 Vomiting  _16 Gastroesophageal reflux/heartburn   _17 Difficulty swallowing. ?Genitourinary:  _18 Chronic kidney disease   _19 Difficult urination  _20 Frequent urination   _21 Blood in urine ?Skin:  _22 Rashes   _23 Ulcers  ?Psychological:  _24 History of anxiety   _25  History of major depression. ? ?Physical Examination ? ?There were no vitals filed for this visit. ?There is no  height or weight on file to calculate BMI. ?Gen: WD/WN, NAD ?Head: [place]/AT, No temporalis wasting.  ?Ear/Nose/Throat: Hearing grossly intact, nares w/o erythema or drainage ?Eyes: PER, EOMI, sclera nonicteric.  ?Neck: Supple, no masses.  No bruit or JVD.  ?Pulmonary:  Good air movement, no audible wheezing, no use of accessory muscles.  ?Cardiac: RRR, normal S1, S2, no Murmurs. ?Vascular:  mild trophic changes, no open wounds skin is dry mild venous changes right ankle ?Vessel Right Left  ?Radial Palpable Palpable  ?Gastrointestinal: soft, non-distended. No guarding/no peritoneal signs.  ?Musculoskeletal: M/S 5/5 throughout.  No visible deformity.  ?Neurologic: CN 2-12 intact. Pain and light touch intact in extremities.  Symmetrical.  Speech is fluent. Motor exam as listed above. ?Psychiatric: Judgment intact, Mood & affect appropriate for pt's clinical situation. ?Dermatologic: No rashes or ulcers noted.  No changes consistent with cellulitis. ? ? ?CBC ?Lab Results  ?Component Value Date  ? WBC 8.2 02/16/2022  ? HGB 9.3 (L) 02/16/2022  ? HCT 28.8 (L) 02/16/2022  ? MCV 91.7 02/16/2022  ? PLT 149 (L) 02/16/2022  ? ? ?BMET ?   ?Component Value Date/Time  ? NA 140 02/16/2022 0311  ? NA 138 01/28/2012 0550  ? K 3.7 02/16/2022 0311  ? K 4.4 01/28/2012 0550  ? CL 106 02/16/2022 0311  ? CL 103 01/28/2012 0550  ? CO2 27 02/16/2022 0311  ? CO2 26 01/28/2012 0550  ? GLUCOSE 114 (H) 02/16/2022 0311  ? GLUCOSE 110 (H) 01/28/2012 0550  ? BUN 25 (H) 02/16/2022 0311  ? BUN 19 (H) 01/28/2012 0550  ? CREATININE 0.77 02/16/2022 0311  ? CREATININE 0.85 01/28/2012 0550  ? CALCIUM 8.3 (L) 02/16/2022 0311  ? CALCIUM 8.7 01/28/2012 0550  ? GFRNONAA >60 02/16/2022 0311  ? GFRNONAA >60 01/28/2012 0550  ? GFRAA >60 01/28/2012 0550  ? ?CrCl cannot be calculated (Patient's most recent lab result is older than the maximum 21 days allowed.). ? ?COAG ?Lab Results  ?Component Value Date  ? INR 1.4 (H) 02/15/2022  ? INR 1.45 09/09/2015  ? INR 1.9  01/29/2012  ? ? ?Radiology ?US Venous Img Lower Unilateral Right (DVT) ? ?Result Date: 03/18/2022 ?CLINICAL DATA:  Pain and swelling EXAM: Right LOWER EXTREMITY VENOUS DOPPLER ULTRASOUND TECHNIQUE: Gray-scale sonography with compression, as well as color and duplex ultrasound, were performed to evaluate the deep venous system(s) from the level of the common femoral vein through the popliteal and proximal calf veins. COMPARISON:  02/19/2022 FINDINGS: VENOUS Normal compressibility of the common femoral, superficial femoral, and popliteal veins, as well as the visualized calf veins. Visualized portions of profunda femoral vein and great saphenous vein unremarkable. No filling defects to suggest DVT on grayscale or color Doppler imaging. Doppler waveforms show normal direction  of venous flow, normal respiratory plasticity and response to augmentation. Limited views of the contralateral common femoral vein are unremarkable. OTHER There is a complex cystic structure measuring 11.1 x 6.7 x 2.9 cm in the subcutaneous plane in the upper medial right thigh which measured 13 x 6 x 2.7 cm in the previous study. There is no demonstrable internal vascularity. The lesion appears more cystic in the current study. Limitations: none IMPRESSION: There is no evidence of deep venous thrombosis in the right lower extremity. There is 11.1 x 6.7 x 2.9 cm complex cystic structure in the subcutaneous plane in the upper medial right thigh without internal vascularity. This may suggest a resolving hematoma. Electronically Signed   By: Elmer Picker M.D.   On: 03/18/2022 11:55   ? ? ?Assessment/Plan ?1. Hematoma of groin, subsequent encounter ?Nearly totally resolved no intervention indicated.  Continue to elevate ? ?2. Bilateral carotid artery stenosis ?Recommend: ?  ?Given the patient's asymptomatic subcritical stenosis no further invasive testing or surgery at this time. ?  ?Duplex ultrasound of the carotid arteries shows 1-39%  bilateral carotid stenosis ?  ?Continue antiplatelet therapy as prescribed ?Continue management of CAD, HTN and Hyperlipidemia ?Healthy heart diet,  encouraged exercise at least 4 times per week ?Follow up in about 2

## 2022-04-03 DIAGNOSIS — D692 Other nonthrombocytopenic purpura: Secondary | ICD-10-CM | POA: Diagnosis not present

## 2022-04-03 DIAGNOSIS — R739 Hyperglycemia, unspecified: Secondary | ICD-10-CM | POA: Diagnosis not present

## 2022-04-03 DIAGNOSIS — E782 Mixed hyperlipidemia: Secondary | ICD-10-CM | POA: Diagnosis not present

## 2022-04-03 DIAGNOSIS — D6869 Other thrombophilia: Secondary | ICD-10-CM | POA: Diagnosis not present

## 2022-04-10 DIAGNOSIS — J8410 Pulmonary fibrosis, unspecified: Secondary | ICD-10-CM | POA: Diagnosis not present

## 2022-04-10 DIAGNOSIS — I739 Peripheral vascular disease, unspecified: Secondary | ICD-10-CM | POA: Diagnosis not present

## 2022-04-10 DIAGNOSIS — J9611 Chronic respiratory failure with hypoxia: Secondary | ICD-10-CM | POA: Diagnosis not present

## 2022-04-10 DIAGNOSIS — I724 Aneurysm of artery of lower extremity: Secondary | ICD-10-CM | POA: Diagnosis not present

## 2022-04-10 DIAGNOSIS — I10 Essential (primary) hypertension: Secondary | ICD-10-CM | POA: Diagnosis not present

## 2022-04-10 DIAGNOSIS — I4819 Other persistent atrial fibrillation: Secondary | ICD-10-CM | POA: Diagnosis not present

## 2022-04-10 DIAGNOSIS — J449 Chronic obstructive pulmonary disease, unspecified: Secondary | ICD-10-CM | POA: Diagnosis not present

## 2022-04-10 DIAGNOSIS — D6869 Other thrombophilia: Secondary | ICD-10-CM | POA: Diagnosis not present

## 2022-04-10 DIAGNOSIS — I25810 Atherosclerosis of coronary artery bypass graft(s) without angina pectoris: Secondary | ICD-10-CM | POA: Diagnosis not present

## 2022-04-10 DIAGNOSIS — M1A00X0 Idiopathic chronic gout, unspecified site, without tophus (tophi): Secondary | ICD-10-CM | POA: Diagnosis not present

## 2022-04-10 DIAGNOSIS — I77810 Thoracic aortic ectasia: Secondary | ICD-10-CM | POA: Diagnosis not present

## 2022-04-23 DIAGNOSIS — I272 Pulmonary hypertension, unspecified: Secondary | ICD-10-CM | POA: Diagnosis not present

## 2022-05-22 DIAGNOSIS — G4733 Obstructive sleep apnea (adult) (pediatric): Secondary | ICD-10-CM | POA: Diagnosis not present

## 2022-05-26 DIAGNOSIS — Z8679 Personal history of other diseases of the circulatory system: Secondary | ICD-10-CM | POA: Diagnosis not present

## 2022-05-26 DIAGNOSIS — Z6831 Body mass index (BMI) 31.0-31.9, adult: Secondary | ICD-10-CM | POA: Diagnosis not present

## 2022-05-26 DIAGNOSIS — M4690 Unspecified inflammatory spondylopathy, site unspecified: Secondary | ICD-10-CM | POA: Diagnosis not present

## 2022-05-26 DIAGNOSIS — I509 Heart failure, unspecified: Secondary | ICD-10-CM | POA: Diagnosis not present

## 2022-05-26 DIAGNOSIS — I2510 Atherosclerotic heart disease of native coronary artery without angina pectoris: Secondary | ICD-10-CM | POA: Diagnosis not present

## 2022-05-26 DIAGNOSIS — F3342 Major depressive disorder, recurrent, in full remission: Secondary | ICD-10-CM | POA: Diagnosis not present

## 2022-06-04 DIAGNOSIS — R0600 Dyspnea, unspecified: Secondary | ICD-10-CM | POA: Diagnosis not present

## 2022-06-04 DIAGNOSIS — R0609 Other forms of dyspnea: Secondary | ICD-10-CM | POA: Diagnosis not present

## 2022-06-04 DIAGNOSIS — I272 Pulmonary hypertension, unspecified: Secondary | ICD-10-CM | POA: Diagnosis not present

## 2022-06-04 DIAGNOSIS — R042 Hemoptysis: Secondary | ICD-10-CM | POA: Diagnosis not present

## 2022-06-04 DIAGNOSIS — J449 Chronic obstructive pulmonary disease, unspecified: Secondary | ICD-10-CM | POA: Diagnosis not present

## 2022-06-04 DIAGNOSIS — R059 Cough, unspecified: Secondary | ICD-10-CM | POA: Diagnosis not present

## 2022-06-04 DIAGNOSIS — R058 Other specified cough: Secondary | ICD-10-CM | POA: Diagnosis not present

## 2022-06-05 DIAGNOSIS — I272 Pulmonary hypertension, unspecified: Secondary | ICD-10-CM | POA: Diagnosis not present

## 2022-06-29 DIAGNOSIS — I739 Peripheral vascular disease, unspecified: Secondary | ICD-10-CM | POA: Diagnosis not present

## 2022-06-29 DIAGNOSIS — J9611 Chronic respiratory failure with hypoxia: Secondary | ICD-10-CM | POA: Diagnosis not present

## 2022-06-29 DIAGNOSIS — I4819 Other persistent atrial fibrillation: Secondary | ICD-10-CM | POA: Diagnosis not present

## 2022-06-29 DIAGNOSIS — R7303 Prediabetes: Secondary | ICD-10-CM | POA: Diagnosis not present

## 2022-06-29 DIAGNOSIS — F411 Generalized anxiety disorder: Secondary | ICD-10-CM | POA: Diagnosis not present

## 2022-07-07 DIAGNOSIS — I739 Peripheral vascular disease, unspecified: Secondary | ICD-10-CM | POA: Diagnosis not present

## 2022-07-07 DIAGNOSIS — I4819 Other persistent atrial fibrillation: Secondary | ICD-10-CM | POA: Diagnosis not present

## 2022-07-07 DIAGNOSIS — I10 Essential (primary) hypertension: Secondary | ICD-10-CM | POA: Diagnosis not present

## 2022-07-07 DIAGNOSIS — M1A00X0 Idiopathic chronic gout, unspecified site, without tophus (tophi): Secondary | ICD-10-CM | POA: Diagnosis not present

## 2022-07-07 DIAGNOSIS — I25810 Atherosclerosis of coronary artery bypass graft(s) without angina pectoris: Secondary | ICD-10-CM | POA: Diagnosis not present

## 2022-07-14 DIAGNOSIS — I4819 Other persistent atrial fibrillation: Secondary | ICD-10-CM | POA: Diagnosis not present

## 2022-07-14 DIAGNOSIS — D6869 Other thrombophilia: Secondary | ICD-10-CM | POA: Diagnosis not present

## 2022-07-14 DIAGNOSIS — I77810 Thoracic aortic ectasia: Secondary | ICD-10-CM | POA: Diagnosis not present

## 2022-07-14 DIAGNOSIS — I724 Aneurysm of artery of lower extremity: Secondary | ICD-10-CM | POA: Diagnosis not present

## 2022-07-14 DIAGNOSIS — I10 Essential (primary) hypertension: Secondary | ICD-10-CM | POA: Diagnosis not present

## 2022-07-14 DIAGNOSIS — J9611 Chronic respiratory failure with hypoxia: Secondary | ICD-10-CM | POA: Diagnosis not present

## 2022-07-14 DIAGNOSIS — J8410 Pulmonary fibrosis, unspecified: Secondary | ICD-10-CM | POA: Diagnosis not present

## 2022-07-14 DIAGNOSIS — Z0000 Encounter for general adult medical examination without abnormal findings: Secondary | ICD-10-CM | POA: Diagnosis not present

## 2022-07-14 DIAGNOSIS — J449 Chronic obstructive pulmonary disease, unspecified: Secondary | ICD-10-CM | POA: Diagnosis not present

## 2022-07-14 DIAGNOSIS — D692 Other nonthrombocytopenic purpura: Secondary | ICD-10-CM | POA: Diagnosis not present

## 2022-07-14 DIAGNOSIS — I739 Peripheral vascular disease, unspecified: Secondary | ICD-10-CM | POA: Diagnosis not present

## 2022-07-14 DIAGNOSIS — I25810 Atherosclerosis of coronary artery bypass graft(s) without angina pectoris: Secondary | ICD-10-CM | POA: Diagnosis not present

## 2022-07-16 DIAGNOSIS — I272 Pulmonary hypertension, unspecified: Secondary | ICD-10-CM | POA: Diagnosis not present

## 2022-08-13 ENCOUNTER — Ambulatory Visit (INDEPENDENT_AMBULATORY_CARE_PROVIDER_SITE_OTHER): Payer: PPO

## 2022-08-13 ENCOUNTER — Encounter (INDEPENDENT_AMBULATORY_CARE_PROVIDER_SITE_OTHER): Payer: Self-pay | Admitting: Vascular Surgery

## 2022-08-13 ENCOUNTER — Ambulatory Visit (INDEPENDENT_AMBULATORY_CARE_PROVIDER_SITE_OTHER): Payer: PPO | Admitting: Vascular Surgery

## 2022-08-13 VITALS — BP 162/79 | HR 67 | Resp 19 | Ht 71.0 in | Wt 231.0 lb

## 2022-08-13 DIAGNOSIS — I10 Essential (primary) hypertension: Secondary | ICD-10-CM

## 2022-08-13 DIAGNOSIS — I729 Aneurysm of unspecified site: Secondary | ICD-10-CM | POA: Diagnosis not present

## 2022-08-13 DIAGNOSIS — I25118 Atherosclerotic heart disease of native coronary artery with other forms of angina pectoris: Secondary | ICD-10-CM | POA: Diagnosis not present

## 2022-08-13 DIAGNOSIS — I739 Peripheral vascular disease, unspecified: Secondary | ICD-10-CM | POA: Diagnosis not present

## 2022-08-13 DIAGNOSIS — I724 Aneurysm of artery of lower extremity: Secondary | ICD-10-CM

## 2022-08-13 DIAGNOSIS — I70213 Atherosclerosis of native arteries of extremities with intermittent claudication, bilateral legs: Secondary | ICD-10-CM | POA: Diagnosis not present

## 2022-08-13 NOTE — H&P (View-Only) (Signed)
MRN : [id number]  [name]. is a 81 y.o. (11/20/1941) male who presents with chief complaint of check circulation.  History of Present Illness:   The patient returns to the office for followup and review of the noninvasive studies. There have been no interval changes in lower extremity symptoms. No interval shortening of the patient's claudication distance or development of rest pain symptoms. No new ulcers or wounds have occurred since the last visit.   There have been no significant changes to the patient's overall health care.   The patient denies amaurosis fugax or recent TIA symptoms. There are no recent neurological changes noted. The patient denies history of DVT, PE or superficial thrombophlebitis. The patient denies recent episodes of angina or shortness of breath.     ABI's Rt=1.08 and Lt=1.25 triphasic signals bilaterally Duplex ultrasound of the popliteal arteries Rt=0.85 cm and Lt=0.98 pseudoaneurysm of the left popliteal is now noted (Previous study Rt=1.3 cm and Lt=0.93).  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  Previous duplex ultrasound of the carotid arteries shows 1-39% bilateral carotid stenosis    No outpatient medications have been marked as taking for the 08/13/22 encounter (Appointment) with Delana Meyer, Dolores Lory, MD.    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[place])    Sleep apnea    CPAP   Tremor     Past Surgical History:  Procedure Laterality Date   CARDIAC SURGERY     CARDIOVERSION N/A 03/03/2018   Procedure: CARDIOVERSION;  Surgeon: [name], MD;  Location: [facility];  Service: Cardiovascular;  Laterality: N/A;   CARDIOVERSION N/A 05/24/2018   Procedure: CARDIOVERSION;  Surgeon: [name], MD;  Location: [facility];  Service: Cardiovascular;   Laterality: N/A;   CATARACT EXTRACTION W/PHACO Right 06/03/2020   Procedure: CATARACT EXTRACTION PHACO AND INTRAOCULAR LENS PLACEMENT ([place]) RIGHT 6.07  00:48.0;  Surgeon: Eulogio Bear, MD;  Location: [facility];  Service: Ophthalmology;  Laterality: Right;  sleep apnea   CORONARY ARTERY BYPASS GRAFT     EYE SURGERY     LEG SURGERY Left    stents placed, Fem-Fem bypass   RIGHT/LEFT HEART CATH AND CORONARY ANGIOGRAPHY Bilateral 02/12/2022   Procedure: RIGHT/LEFT HEART CATH AND CORONARY ANGIOGRAPHY;  Surgeon: [name], MD;  Location: [facility];  Service: Cardiovascular;  Laterality: Bilateral;   TONSILLECTOMY      Social History Social History   Tobacco Use   Smoking status: Former    Types: Cigarettes    Quit date: 02/16/1979    Years since quitting: 43.5   Smokeless tobacco: Never  Vaping Use   Vaping Use: Never used  Substance Use Topics   Alcohol use: Yes    Comment: rarely   Drug use: No    Family History Family History  Problem Relation Age of Onset   Thyroid disease Mother    Macular degeneration Mother    Cancer Father     Allergies  Allergen Reactions   Fish Allergy Hives   Hydrocodone     Other reaction(s): Other (See Comments) confusion   Tramadol Other (See Comments)    confusion   Hydrochlorothiazide Other (See Comments)  worsens gout   Iodine Rash   Shellfish Allergy Rash     REVIEW OF SYSTEMS (Negative unless checked)  Constitutional: _0 Weight loss  _1 Fever  _2 Chills Cardiac: _3 Chest pain   _4 Chest pressure   _5 Palpitations   _6 Shortness of breath when laying flat   _7 Shortness of breath with exertion. Vascular:  _8 Pain in legs with walking   _9 Pain in legs at rest  _10 History of DVT   _11 Phlebitis   _12 Swelling in legs   _13 Varicose veins   _14 Non-healing ulcers Pulmonary:   _15 Uses home oxygen   _16 Productive cough   _17 Hemoptysis   _18 Wheeze  _19 COPD   _20 Asthma Neurologic:  _21 Dizziness   _22 Seizures   _23 History of stroke    _24 History of TIA  _25 Aphasia   _26 Vissual changes   _27 Weakness or numbness in arm   _28 Weakness or numbness in leg Musculoskeletal:   _29 Joint swelling   _30 Joint pain   _31 Low back pain Hematologic:  _32 Easy bruising  _33 Easy bleeding   _34 Hypercoagulable state   _35 Anemic Gastrointestinal:  _36 Diarrhea   _37 Vomiting  _38 Gastroesophageal reflux/heartburn   _39 Difficulty swallowing. Genitourinary:  _40 Chronic kidney disease   _41 Difficult urination  _42 Frequent urination   _43 Blood in urine Skin:  _44 Rashes   _45 Ulcers  Psychological:  _46 History of anxiety   _47  History of major depression.  Physical Examination  There were no vitals filed for this visit. There is no height or weight on file to calculate BMI. Gen: WD/WN, NAD Head: [place]/AT, No temporalis wasting.  Ear/Nose/Throat: Hearing grossly intact, nares w/o erythema or drainage Eyes: PER, EOMI, sclera nonicteric.  Neck: Supple, no masses.  No bruit or JVD.  Pulmonary:  Good air movement, no audible wheezing, no use of accessory muscles.  Cardiac: RRR, normal S1, S2, no Murmurs. Vascular:  Popliteal artery not paopable no open wounds Vessel Right Left  Radial Palpable Palpable  PT  Palpable Palpable  DP  Palpable Palpable  Gastrointestinal: soft, non-distended. No guarding/no peritoneal signs.  Musculoskeletal: M/S 5/5 throughout.  No visible deformity.  Neurologic: CN 2-12 intact. Pain and light touch intact in extremities.  Symmetrical.  Speech is fluent. Motor exam as listed above. Psychiatric: Judgment intact, Mood & affect appropriate for pt's clinical situation. Dermatologic: No rashes or ulcers noted.  No changes consistent with cellulitis.   CBC Lab Results  Component Value Date   WBC 8.2 02/16/2022   HGB 9.3 (L) 02/16/2022   HCT 28.8 (L) 02/16/2022   MCV 91.7 02/16/2022   PLT 149 (L) 02/16/2022    BMET    Component Value Date/Time   NA 140 02/16/2022 0311   NA 138 01/28/2012 0550   K 3.7 02/16/2022 0311   K 4.4 01/28/2012 0550    CL 106 02/16/2022 0311   CL 103 01/28/2012 0550   CO2 27 02/16/2022 0311   CO2 26 01/28/2012 0550   GLUCOSE 114 (H) 02/16/2022 0311   GLUCOSE 110 (H) 01/28/2012 0550   BUN 25 (H) 02/16/2022 0311   BUN 19 (H) 01/28/2012 0550   CREATININE 0.77 02/16/2022 0311   CREATININE 0.85 01/28/2012 0550   CALCIUM 8.3 (L) 02/16/2022 0311   CALCIUM 8.7 01/28/2012 0550   GFRNONAA >60 02/16/2022 0311   GFRNONAA >60 01/28/2012 0550   GFRAA >60 01/28/2012 0550   CrCl cannot be calculated (Patient's most recent lab result is older than the maximum 21 days allowed.).  COAG Lab Results  Component Value Date   INR 1.4 (H) 02/15/2022   INR 1.45 09/09/2015   INR 1.9 01/29/2012  Radiology No results found.   Assessment/Plan 1. Pseudoaneurysm ([place]) Recommend:  The patient has experienced claudication symptoms and  appears to be having a pseudoaneurysm that is new on the left.  Given the severity of the patient's severe left lower extremity symptoms the patient should undergo angiography with the hope for intervention.  Risk and benefits were reviewed the patient.  Indications for the procedure were reviewed.  All questions were answered, the patient agrees to proceed with left lower extremity angiography and possible intervention.   The patient should continue walking and begin a more formal exercise program.  The patient should continue antiplatelet therapy and aggressive treatment of the lipid abnormalities  The patient will follow up with me after the angiogram.    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 Atherosclerosis of native artery of both lower extremities with intermittent claudication (HCC)  Recommend:  The patient has evidence of atherosclerosis of the lower extremities with claudication.  The patient does not voice lifestyle limiting changes at this point in time.  Noninvasive studies do not suggest clinically significant change.  No invasive studies, angiography or  surgery at this time The patient should continue walking and begin a more formal exercise program.  The patient should continue antiplatelet therapy and aggressive treatment of the lipid abnormalities  No changes in the patient's medications at this time  Continued surveillance is indicated as atherosclerosis is likely to progress with time.    The patient will continue follow up with noninvasive studies as ordered.    4. Coronary artery disease of native artery of native heart with stable angina pectoris (HCC) Continue cardiac and antihypertensive medications as already ordered and reviewed, no changes at this time.  Continue statin as ordered and reviewed, no changes at this time  Nitrates PRN for chest pain   5. Benign essential hypertension Continue antihypertensive medications as already ordered, these medications have been reviewed and there are no changes at this time.     Hortencia Pilar, MD  08/13/2022 9:25 AM

## 2022-08-13 NOTE — Progress Notes (Signed)
MRN : [id number]  [name]. is a 81 y.o. (March 08, 1941) male who presents with chief complaint of check circulation.  History of Present Illness:   The patient returns to the office for followup and review of the noninvasive studies. There have been no interval changes in lower extremity symptoms. No interval shortening of the patient's claudication distance or development of rest pain symptoms. No new ulcers or wounds have occurred since the last visit.   There have been no significant changes to the patient's overall health care.   The patient denies amaurosis fugax or recent TIA symptoms. There are no recent neurological changes noted. The patient denies history of DVT, PE or superficial thrombophlebitis. The patient denies recent episodes of angina or shortness of breath.     ABI's Rt=1.08 and Lt=1.25 triphasic signals bilaterally Duplex ultrasound of the popliteal arteries Rt=0.85 cm and Lt=0.98 pseudoaneurysm of the left popliteal is now noted (Previous study Rt=1.3 cm and Lt=0.93).  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  Previous duplex ultrasound of the carotid arteries shows 1-39% bilateral carotid stenosis    No outpatient medications have been marked as taking for the 08/13/22 encounter (Appointment) with Delana Meyer, Dolores Lory, MD.    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[place])    Sleep apnea    CPAP   Tremor     Past Surgical History:  Procedure Laterality Date   CARDIAC SURGERY     CARDIOVERSION N/A 03/03/2018   Procedure: CARDIOVERSION;  Surgeon: [name], MD;  Location: [facility];  Service: Cardiovascular;  Laterality: N/A;   CARDIOVERSION N/A 05/24/2018   Procedure: CARDIOVERSION;  Surgeon: [name], MD;  Location: [facility];  Service: Cardiovascular;   Laterality: N/A;   CATARACT EXTRACTION W/PHACO Right 06/03/2020   Procedure: CATARACT EXTRACTION PHACO AND INTRAOCULAR LENS PLACEMENT ([place]) RIGHT 6.07  00:48.0;  Surgeon: Eulogio Bear, MD;  Location: [facility];  Service: Ophthalmology;  Laterality: Right;  sleep apnea   CORONARY ARTERY BYPASS GRAFT     EYE SURGERY     LEG SURGERY Left    stents placed, Fem-Fem bypass   RIGHT/LEFT HEART CATH AND CORONARY ANGIOGRAPHY Bilateral 02/12/2022   Procedure: RIGHT/LEFT HEART CATH AND CORONARY ANGIOGRAPHY;  Surgeon: [name], MD;  Location: [facility];  Service: Cardiovascular;  Laterality: Bilateral;   TONSILLECTOMY      Social History Social History   Tobacco Use   Smoking status: Former    Types: Cigarettes    Quit date: 02/16/1979    Years since quitting: 43.5   Smokeless tobacco: Never  Vaping Use   Vaping Use: Never used  Substance Use Topics   Alcohol use: Yes    Comment: rarely   Drug use: No    Family History Family History  Problem Relation Age of Onset   Thyroid disease Mother    Macular degeneration Mother    Cancer Father     Allergies  Allergen Reactions   Fish Allergy Hives   Hydrocodone     Other reaction(s): Other (See Comments) confusion   Tramadol Other (See Comments)    confusion   Hydrochlorothiazide Other (See Comments)  worsens gout   Iodine Rash   Shellfish Allergy Rash     REVIEW OF SYSTEMS (Negative unless checked)  Constitutional: _0 Weight loss  _1 Fever  _2 Chills Cardiac: _3 Chest pain   _4 Chest pressure   _5 Palpitations   _6 Shortness of breath when laying flat   _7 Shortness of breath with exertion. Vascular:  _8 Pain in legs with walking   _9 Pain in legs at rest  _10 History of DVT   _11 Phlebitis   _12 Swelling in legs   _13 Varicose veins   _14 Non-healing ulcers Pulmonary:   _15 Uses home oxygen   _16 Productive cough   _17 Hemoptysis   _18 Wheeze  _19 COPD   _20 Asthma Neurologic:  _21 Dizziness   _22 Seizures   _23 History of stroke    _24 History of TIA  _25 Aphasia   _26 Vissual changes   _27 Weakness or numbness in arm   _28 Weakness or numbness in leg Musculoskeletal:   _29 Joint swelling   _30 Joint pain   _31 Low back pain Hematologic:  _32 Easy bruising  _33 Easy bleeding   _34 Hypercoagulable state   _35 Anemic Gastrointestinal:  _36 Diarrhea   _37 Vomiting  _38 Gastroesophageal reflux/heartburn   _39 Difficulty swallowing. Genitourinary:  _40 Chronic kidney disease   _41 Difficult urination  _42 Frequent urination   _43 Blood in urine Skin:  _44 Rashes   _45 Ulcers  Psychological:  _46 History of anxiety   _47  History of major depression.  Physical Examination  There were no vitals filed for this visit. There is no height or weight on file to calculate BMI. Gen: WD/WN, NAD Head: [place]/AT, No temporalis wasting.  Ear/Nose/Throat: Hearing grossly intact, nares w/o erythema or drainage Eyes: PER, EOMI, sclera nonicteric.  Neck: Supple, no masses.  No bruit or JVD.  Pulmonary:  Good air movement, no audible wheezing, no use of accessory muscles.  Cardiac: RRR, normal S1, S2, no Murmurs. Vascular:  Popliteal artery not paopable no open wounds Vessel Right Left  Radial Palpable Palpable  PT  Palpable Palpable  DP  Palpable Palpable  Gastrointestinal: soft, non-distended. No guarding/no peritoneal signs.  Musculoskeletal: M/S 5/5 throughout.  No visible deformity.  Neurologic: CN 2-12 intact. Pain and light touch intact in extremities.  Symmetrical.  Speech is fluent. Motor exam as listed above. Psychiatric: Judgment intact, Mood & affect appropriate for pt's clinical situation. Dermatologic: No rashes or ulcers noted.  No changes consistent with cellulitis.   CBC Lab Results  Component Value Date   WBC 8.2 02/16/2022   HGB 9.3 (L) 02/16/2022   HCT 28.8 (L) 02/16/2022   MCV 91.7 02/16/2022   PLT 149 (L) 02/16/2022    BMET    Component Value Date/Time   NA 140 02/16/2022 0311   NA 138 01/28/2012 0550   K 3.7 02/16/2022 0311   K 4.4 01/28/2012 0550    CL 106 02/16/2022 0311   CL 103 01/28/2012 0550   CO2 27 02/16/2022 0311   CO2 26 01/28/2012 0550   GLUCOSE 114 (H) 02/16/2022 0311   GLUCOSE 110 (H) 01/28/2012 0550   BUN 25 (H) 02/16/2022 0311   BUN 19 (H) 01/28/2012 0550   CREATININE 0.77 02/16/2022 0311   CREATININE 0.85 01/28/2012 0550   CALCIUM 8.3 (L) 02/16/2022 0311   CALCIUM 8.7 01/28/2012 0550   GFRNONAA >60 02/16/2022 0311   GFRNONAA >60 01/28/2012 0550   GFRAA >60 01/28/2012 0550   CrCl cannot be calculated (Patient's most recent lab result is older than the maximum 21 days allowed.).  COAG Lab Results  Component Value Date   INR 1.4 (H) 02/15/2022   INR 1.45 09/09/2015   INR 1.9 01/29/2012  Radiology No results found.   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  Given the severity of the patient's severe left lower extremity symptoms the patient should undergo angiography with the hope for intervention.  Risk and benefits were reviewed the patient.  Indications for the procedure were reviewed.  All questions were answered, the patient agrees to proceed with left lower extremity angiography and possible intervention.   The patient should continue walking and begin a more formal exercise program.  The patient should continue antiplatelet therapy and aggressive treatment of the lipid abnormalities  The patient will follow up with me after the angiogram.    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 Atherosclerosis of native artery of both lower extremities with intermittent claudication (HCC)  Recommend:  The patient has evidence of atherosclerosis of the lower extremities with claudication.  The patient does not voice lifestyle limiting changes at this point in time.  Noninvasive studies do not suggest clinically significant change.  No invasive studies, angiography or  surgery at this time The patient should continue walking and begin a more formal exercise program.  The patient should continue antiplatelet therapy and aggressive treatment of the lipid abnormalities  No changes in the patient's medications at this time  Continued surveillance is indicated as atherosclerosis is likely to progress with time.    The patient will continue follow up with noninvasive studies as ordered.    4. Coronary artery disease of native artery of native heart with stable angina pectoris (HCC) Continue cardiac and antihypertensive medications as already ordered and reviewed, no changes at this time.  Continue statin as ordered and reviewed, no changes at this time  Nitrates PRN for chest pain   5. Benign essential hypertension Continue antihypertensive medications as already ordered, these medications have been reviewed and there are no changes at this time.     Hortencia Pilar, MD  08/13/2022 9:25 AM

## 2022-08-14 ENCOUNTER — Telehealth (INDEPENDENT_AMBULATORY_CARE_PROVIDER_SITE_OTHER): Payer: Self-pay

## 2022-08-14 ENCOUNTER — Encounter (INDEPENDENT_AMBULATORY_CARE_PROVIDER_SITE_OTHER): Payer: Self-pay | Admitting: Vascular Surgery

## 2022-08-14 DIAGNOSIS — I729 Aneurysm of unspecified site: Secondary | ICD-10-CM | POA: Insufficient documentation

## 2022-08-14 NOTE — Telephone Encounter (Signed)
He can, I wouldn't do any heavy lifting but things like a treadmill, stationary bike or elliptical are ok

## 2022-08-14 NOTE — Telephone Encounter (Signed)
Pt made aware and he states understanding

## 2022-08-14 NOTE — Telephone Encounter (Signed)
Pt called stating that he was told yesterday that he has an aneurysm and needs a stent placed.  He wants to know if he can continue to go to the gym daily until his surgery. Please advise

## 2022-08-24 ENCOUNTER — Telehealth (INDEPENDENT_AMBULATORY_CARE_PROVIDER_SITE_OTHER): Payer: Self-pay

## 2022-08-24 NOTE — Telephone Encounter (Signed)
Spoke with the patient and he is scheduled with Dr. Delana Meyer on 09/01/22 for a left leg angio with a 6:45 am arrival time tot he MM. Pre-procedure instructions were discussed and will be mailed.

## 2022-08-25 DIAGNOSIS — J449 Chronic obstructive pulmonary disease, unspecified: Secondary | ICD-10-CM | POA: Diagnosis not present

## 2022-08-25 DIAGNOSIS — I272 Pulmonary hypertension, unspecified: Secondary | ICD-10-CM | POA: Diagnosis not present

## 2022-09-01 ENCOUNTER — Ambulatory Visit
Admission: RE | Admit: 2022-09-01 | Discharge: 2022-09-01 | Disposition: A | Discharge status: Home / Self Care | Payer: PPO | Attending: Vascular Surgery | Admitting: Vascular Surgery

## 2022-09-01 ENCOUNTER — Encounter: Payer: Self-pay | Admitting: Vascular Surgery

## 2022-09-01 ENCOUNTER — Other Ambulatory Visit: Payer: Self-pay

## 2022-09-01 ENCOUNTER — Encounter
Admission: RE | Disposition: A | Discharge status: Home / Self Care | Payer: Self-pay | Source: Home / Self Care | Attending: Vascular Surgery

## 2022-09-01 DIAGNOSIS — G4730 Sleep apnea, unspecified: Secondary | ICD-10-CM | POA: Insufficient documentation

## 2022-09-01 DIAGNOSIS — I25118 Atherosclerotic heart disease of native coronary artery with other forms of angina pectoris: Secondary | ICD-10-CM | POA: Diagnosis not present

## 2022-09-01 DIAGNOSIS — I70222 Atherosclerosis of native arteries of extremities with rest pain, left leg: Secondary | ICD-10-CM | POA: Insufficient documentation

## 2022-09-01 DIAGNOSIS — I724 Aneurysm of artery of lower extremity: Secondary | ICD-10-CM

## 2022-09-01 DIAGNOSIS — I10 Essential (primary) hypertension: Secondary | ICD-10-CM | POA: Diagnosis not present

## 2022-09-01 DIAGNOSIS — Z87891 Personal history of nicotine dependence: Secondary | ICD-10-CM | POA: Diagnosis not present

## 2022-09-01 DIAGNOSIS — I70219 Atherosclerosis of native arteries of extremities with intermittent claudication, unspecified extremity: Secondary | ICD-10-CM

## 2022-09-01 HISTORY — PX: LOWER EXTREMITY ANGIOGRAPHY: CATH118251

## 2022-09-01 LAB — CREATININE, SERUM
Creatinine, Ser: 0.93 mg/dL (ref 0.61–1.24)
GFR, Estimated: >60 mL/min (ref >60)

## 2022-09-01 LAB — BUN: BUN: 19 mg/dL (ref 8–23)

## 2022-09-01 SURGERY — LOWER EXTREMITY ANGIOGRAPHY
Anesthesia: Moderate Sedation | Site: Leg Lower | Laterality: Left

## 2022-09-01 MED ORDER — HEPARIN SODIUM (PORCINE) 1000 UNIT/ML IJ SOLN
INTRAMUSCULAR | Status: DC | PRN
  Administered 2022-09-01: 6000 [IU] via INTRAVENOUS

## 2022-09-01 MED ORDER — DIPHENHYDRAMINE HCL 50 MG/ML IJ SOLN: 50.0000 mg | Freq: Once | INTRAMUSCULAR | Status: AC | PRN

## 2022-09-01 MED ORDER — HEPARIN SODIUM (PORCINE) 1000 UNIT/ML IJ SOLN
INTRAMUSCULAR | Status: AC
  Filled 2022-09-01: qty 10

## 2022-09-01 MED ORDER — OXYCODONE HCL 5 MG PO TABS: 5.0000 mg | ORAL_TABLET | ORAL | Status: DC | PRN

## 2022-09-01 MED ORDER — MORPHINE SULFATE (PF) 4 MG/ML IV SOLN: 2.0000 mg | INTRAVENOUS | Status: DC | PRN

## 2022-09-01 MED ORDER — IODIXANOL 320 MG/ML IV SOLN
INTRAVENOUS | Status: DC | PRN
  Administered 2022-09-01: 70 mL

## 2022-09-01 MED ORDER — FAMOTIDINE 20 MG PO TABS: 40.0000 mg | ORAL_TABLET | Freq: Once | ORAL | Status: AC | PRN

## 2022-09-01 MED ORDER — CEFAZOLIN SODIUM-DEXTROSE 2-4 GM/100ML-% IV SOLN: 2.0000 g | INTRAVENOUS | Status: AC

## 2022-09-01 MED ORDER — FAMOTIDINE 20 MG PO TABS
ORAL_TABLET | ORAL | Status: AC
  Administered 2022-09-01: 40 mg via ORAL
  Filled 2022-09-01: qty 2

## 2022-09-01 MED ORDER — SODIUM CHLORIDE 0.9% FLUSH: 3.0000 mL | Freq: Two times a day (BID) | INTRAVENOUS | Status: DC

## 2022-09-01 MED ORDER — ACETAMINOPHEN 325 MG PO TABS: 650.0000 mg | ORAL_TABLET | ORAL | Status: DC | PRN

## 2022-09-01 MED ORDER — SODIUM CHLORIDE 0.9 % IV SOLN: 250.0000 mL | INTRAVENOUS | Status: DC | PRN

## 2022-09-01 MED ORDER — SODIUM CHLORIDE 0.9% FLUSH: 3.0000 mL | INTRAVENOUS | Status: DC | PRN

## 2022-09-01 MED ORDER — SODIUM CHLORIDE 0.9 % IV SOLN: INTRAVENOUS | Status: DC

## 2022-09-01 MED ORDER — MIDAZOLAM HCL 2 MG/2ML IJ SOLN
INTRAMUSCULAR | Status: DC | PRN
  Administered 2022-09-01 (×2): 1 mg via INTRAVENOUS
  Administered 2022-09-01: 2 mg via INTRAVENOUS

## 2022-09-01 MED ORDER — METHYLPREDNISOLONE SODIUM SUCC 125 MG IJ SOLR: 125.0000 mg | Freq: Once | INTRAMUSCULAR | Status: AC | PRN

## 2022-09-01 MED ORDER — FENTANYL CITRATE (PF) 100 MCG/2ML IJ SOLN
INTRAMUSCULAR | Status: AC
  Filled 2022-09-01: qty 2

## 2022-09-01 MED ORDER — DIPHENHYDRAMINE HCL 50 MG/ML IJ SOLN
INTRAMUSCULAR | Status: AC
  Administered 2022-09-01: 50 mg via INTRAVENOUS
  Filled 2022-09-01: qty 1

## 2022-09-01 MED ORDER — LABETALOL HCL 5 MG/ML IV SOLN: 10.0000 mg | INTRAVENOUS | Status: DC | PRN

## 2022-09-01 MED ORDER — FENTANYL CITRATE (PF) 100 MCG/2ML IJ SOLN
INTRAMUSCULAR | Status: DC | PRN
  Administered 2022-09-01: 50 ug via INTRAVENOUS
  Administered 2022-09-01: 25 ug via INTRAVENOUS
  Administered 2022-09-01: 12.5 ug via INTRAVENOUS

## 2022-09-01 MED ORDER — CEFAZOLIN SODIUM-DEXTROSE 2-4 GM/100ML-% IV SOLN
INTRAVENOUS | Status: AC
  Administered 2022-09-01: 2 g via INTRAVENOUS
  Filled 2022-09-01: qty 100

## 2022-09-01 MED ORDER — MIDAZOLAM HCL 5 MG/5ML IJ SOLN
INTRAMUSCULAR | Status: AC
  Filled 2022-09-01: qty 5

## 2022-09-01 MED ORDER — ASPIRIN 81 MG PO TBEC: 81.0000 mg | DELAYED_RELEASE_TABLET | Freq: Every day | ORAL | 2 refills | Status: AC

## 2022-09-01 MED ORDER — ASPIRIN 325 MG PO TABS
325.0000 mg | ORAL_TABLET | ORAL | Status: DC
  Filled 2022-09-01: qty 1

## 2022-09-01 MED ORDER — HYDRALAZINE HCL 20 MG/ML IJ SOLN: 5.0000 mg | INTRAMUSCULAR | Status: DC | PRN

## 2022-09-01 MED ORDER — METHYLPREDNISOLONE SODIUM SUCC 125 MG IJ SOLR
INTRAMUSCULAR | Status: AC
  Administered 2022-09-01: 125 mg via INTRAVENOUS
  Filled 2022-09-01: qty 2

## 2022-09-01 MED ORDER — ONDANSETRON HCL 4 MG/2ML IJ SOLN: 4.0000 mg | Freq: Four times a day (QID) | INTRAMUSCULAR | Status: DC | PRN

## 2022-09-01 SURGICAL SUPPLY — 25 items
ADPR CATH 9FR SLF ADJ SL SD (SHEATH) ×1
BALLN LUTONIX 7X80X130 (BALLOONS) ×1
BALLN LUTONIX DCB 7X60X130 (BALLOONS) ×1
BALLOON LUTONIX 7X80X130 (BALLOONS) IMPLANT
BALLOON LUTONIX DCB 7X60X130 (BALLOONS) IMPLANT
CATH ANGIO 5F PIGTAIL 65CM (CATHETERS) IMPLANT
CATH VERT 5FR 125CM (CATHETERS) IMPLANT
DEVICE SAFEGUARD 24CM (GAUZE/BANDAGES/DRESSINGS) IMPLANT
DEVICE STARCLOSE SE CLOSURE (Vascular Products) IMPLANT
GLIDEWIRE ADV .035X260CM (WIRE) IMPLANT
KIT ENCORE 26 ADVANTAGE (KITS) IMPLANT
NDL ENTRY 21GA 7CM ECHOTIP (NEEDLE) IMPLANT
NEEDLE ENTRY 21GA 7CM ECHOTIP (NEEDLE) ×1 IMPLANT
PACK ANGIOGRAPHY (CUSTOM PROCEDURE TRAY) ×1 IMPLANT
SET INTRO CAPELLA COAXIAL (SET/KITS/TRAYS/PACK) IMPLANT
SHEATH BRITE TIP 5FRX11 (SHEATH) IMPLANT
SHEATH PROBE COVER 6X72 (BAG) IMPLANT
SHEATH SHUTTLE 7FR (SHEATH) IMPLANT
STENT VIABAHN 8X150X120 (Permanent Stent) ×1 IMPLANT
STENT VIABAHN 8X15X120 7FR (Permanent Stent) IMPLANT
SYR MEDRAD MARK 7 150ML (SYRINGE) IMPLANT
TUBING CONTRAST HIGH PRESS 72 (TUBING) IMPLANT
VALVE CHECKFLO PERFORMER (SHEATH) IMPLANT
WIRE G V18X300CM (WIRE) IMPLANT
WIRE GUIDERIGHT .035X150 (WIRE) IMPLANT

## 2022-09-01 NOTE — Interval H&P Note (Signed)
History and Physical Interval Note:  09/01/2022 8:04 AM  [name].  has presented today for surgery, with the diagnosis of L leg angio   ASO w claudication  Pseudo and popliteal aneurysm.  The various methods of treatment have been discussed with the patient and family. After consideration of risks, benefits and other options for treatment, the patient has consented to  Procedure(s): Lower Extremity Angiography (Left) as a surgical intervention.  The patient's history has been reviewed, patient examined, no change in status, stable for surgery.  I have reviewed the patient's chart and labs.  Questions were answered to the patient's satisfaction.     Hortencia Pilar

## 2022-09-01 NOTE — Progress Notes (Signed)
Pt sitting up, CPAP removed, eating and drinking

## 2022-09-01 NOTE — Progress Notes (Signed)
Bipap on, family at bedside

## 2022-09-01 NOTE — Op Note (Signed)
[facility]  Percutaneous Study/Intervention Procedural Note   Date of Surgery: 09/01/2022  Surgeon:  Katha Cabal, MD.  Pre-operative Diagnosis: Pseudoaneurysm of the left popliteal stent; status post repair popliteal artery aneurysm; atherosclerotic occlusive disease bilateral lower extremities with mild rest pain of the left lower extremity.  Post-operative diagnosis:  Same  Procedure(s) Performed:             1.  Introduction catheter into left lower extremity 3rd order catheter placement              2.    Contrast injection left lower extremity for distal runoff             3.  Percutaneous transluminal angioplasty and stent placement left popliteal artery              4.  Star close closure right common femoral arteriotomy  Anesthesia: Conscious sedation was administered under my direct supervision by the interventional radiology RN. IV Versed plus fentanyl were utilized. Continuous ECG, pulse oximetry and blood pressure was monitored throughout the entire procedure.  Conscious sedation was for a total of 1 hour 18 seconds.  Sheath: 90 cm 7 [place] shuttle sheath right common femoral retrograde  Contrast: 70 cc  Fluoroscopy Time: 7.6 minutes  Indications:  [name]. presents with increasing pain of the left lower extremity.  Noninvasive studies demonstrate a pseudoaneurysm of his previously stent repair of the popliteal artery aneurysm.  He also has known atherosclerotic occlusive disease identified by noninvasive study.  This places him at risk for limb loss.  The risks and benefits are reviewed all questions answered patient agrees to proceed.  Procedure:  [name]. is a 81 y.o. y.o. male who was identified and appropriate procedural time out was performed.  The patient was then placed supine on the table and prepped and draped in the usual sterile fashion.    Ultrasound was placed in the sterile sleeve and the right groin  was evaluated the right common femoral artery was echolucent and pulsatile indicating patency.  Image was recorded for the permanent record and under real-time visualization a microneedle was inserted into the common femoral artery microwire followed by a micro-sheath.  A J-wire was then advanced through the micro-sheath and a  5 [place] sheath was then inserted over a J-wire. J-wire was then advanced and a 5 French pigtail catheter was positioned at the level of T12. AP projection of the aorta was then obtained. Pigtail catheter was repositioned to above the bifurcation and a RAO view of the pelvis was obtained.  Subsequently a pigtail catheter with the stiff angle Glidewire was used to cross the aortic bifurcation the catheter wire were advanced down into the left distal external iliac artery. Oblique view of the femoral bifurcation was then obtained and subsequently the wire was reintroduced and the pigtail catheter negotiated into the SFA representing third order catheter placement. Distal runoff was then performed.  6000 units of heparin was then given and allowed to circulate and a 7 6 French 90 cm shuttle sheath was advanced up and over the bifurcation and positioned in the femoral artery  KMP  catheter and V18 wire were then negotiated down into the distal popliteal and then the anterior tibial.  A 7 mm x 80 mm Lutonix balloon was used to angioplasty the mid popliteal and the 3 focal hemodynamically significant stenoses. Inflation was to 10 atmospheres for 2 minutes. Follow-up imaging demonstrated patency  with less than 10% residual stenosis.  A 8 mm x 150 mm Viabahn stent was then advanced across all the lesions and the 2 aneurysms that have been identified.  He was deployed without difficulty.  A 7 mm x 60 mm Lutonix drug-eluting balloon was then used to post dilate the distal end of the stent to prevent restenosis.  Serial dilatation using this balloon was then performed to the entire length of the  Viabahn stent.  Inflations were to 12 to 16 atm.  Follow-up imaging demonstrated wide patency of the popliteal artery with full expansion of the stent there was elimination of both aneurysmal outpouchings.  There is less than 10% residual stenoses noted.  Three-vessel runoff was preserved.  After review of these images the sheath is pulled into the right external iliac oblique of the common femoral is obtained and a Star close device deployed. There no immediate complications.   Findings:  The abdominal aorta is opacified with a bolus injection contrast. Renal arteries are somewhat difficult to visualize secondary to his breathing but appear widely patent. The aorta itself has diffuse disease but no hemodynamically significant lesions. The common and external iliac arteries are widely patent bilaterally.  The left common femoral is widely patent as is the profunda femoris.  The SFA does indeed have increasing atherosclerotic changes but there are no hemodynamically significant stenoses.  The popliteal artery demonstrates a previously placed stent.  In the midportion there is a focal greater than 70% stenosis.  Just 2 to 3 cm below this lesion there appears to be extravasation consistent with the previously documented pseudoaneurysm.  Just below the pseudoaneurysm there is a second focal 70% stenosis.  Approximately 2 cm below this there is a third 70% stenosis which is again is focal.  All 3 of the stenotic lesions are approximately 10 mm in length.  Just below this lesion there is yet another aneurysmal dilatation of the native artery.  Distal to this there is a 70% stenosis in the native popliteal.  There is then approximately 2 to 3 cm of widely patent popliteal which fills the trifurcation which is widely patent.  There is three-vessel runoff to the foot.    Follow-up imaging status post stent placement demonstrated wide patency of the popliteal artery with full expansion of the stent there was  elimination of both aneurysmal outpouchings.  There is less than 10% residual stenoses noted noted on all 3 previously stenotic locations.  Three-vessel runoff was preserved.    Summary: Successful recanalization left lower extremity for limb salvage                        Disposition: Patient was taken to the recovery room in stable condition having tolerated the procedure well.  Clydia Nieves, Dolores Lory 09/01/2022,9:57 AM

## 2022-09-01 NOTE — Discharge Instructions (Signed)
Follow up with Vein and Vascular office on 2:00 pm on Tuesday, Sep 29, 2022.

## 2022-09-02 ENCOUNTER — Encounter: Payer: Self-pay | Admitting: Vascular Surgery

## 2022-09-02 ENCOUNTER — Telehealth (INDEPENDENT_AMBULATORY_CARE_PROVIDER_SITE_OTHER): Payer: Self-pay

## 2022-09-02 NOTE — Telephone Encounter (Signed)
  Pt LVM stating he was taking Eliquis did he need to also take 18m Aspirin as his Cardiologist told him not to take both.  I spoke with FCurly RimNP and she stated that he needed to take both due to having the procedure it would probably not be a longterm decision but take both until next appt and then we will discuss further instructions.  Called pt and explained and he stated understanding.

## 2022-09-07 ENCOUNTER — Telehealth (INDEPENDENT_AMBULATORY_CARE_PROVIDER_SITE_OTHER): Payer: Self-pay | Admitting: Vascular Surgery

## 2022-09-07 NOTE — Telephone Encounter (Signed)
Patient called stating he had a procedure done last week and is wanting to know when is it ok for him to get back into the gym . Patient also wants to know since Dr. Delana Meyer prescribed him a low dose of Asprin should he be taking it being his cardiologist has him on Eliquis. He's been taking Eliquis for a few years and only stopped taking it a few days prior to the procedure.      Please call and advise

## 2022-09-07 NOTE — Telephone Encounter (Signed)
I called and spoke to the patient directly and told him that he would need to take aspirin for a few months, in addition to the eliquis, for a least a few months post stent placement.  He is advised that he can start light activity on Wednesday.

## 2022-09-08 ENCOUNTER — Telehealth (INDEPENDENT_AMBULATORY_CARE_PROVIDER_SITE_OTHER): Payer: Self-pay

## 2022-09-08 NOTE — Telephone Encounter (Signed)
Yes he can

## 2022-09-08 NOTE — Telephone Encounter (Signed)
Spoke with the patient and gave him the recommendation from [facility]. See notes below.

## 2022-09-08 NOTE — Telephone Encounter (Addendum)
Patient called stating his left leg is swelling and wants to know can he wear compression hose. Please advise.

## 2022-09-23 DIAGNOSIS — I10 Essential (primary) hypertension: Secondary | ICD-10-CM | POA: Diagnosis not present

## 2022-09-23 DIAGNOSIS — I25810 Atherosclerosis of coronary artery bypass graft(s) without angina pectoris: Secondary | ICD-10-CM | POA: Diagnosis not present

## 2022-09-23 DIAGNOSIS — I739 Peripheral vascular disease, unspecified: Secondary | ICD-10-CM | POA: Diagnosis not present

## 2022-09-23 DIAGNOSIS — I4819 Other persistent atrial fibrillation: Secondary | ICD-10-CM | POA: Diagnosis not present

## 2022-09-28 ENCOUNTER — Other Ambulatory Visit (INDEPENDENT_AMBULATORY_CARE_PROVIDER_SITE_OTHER): Payer: Self-pay | Admitting: Vascular Surgery

## 2022-09-28 DIAGNOSIS — I739 Peripheral vascular disease, unspecified: Secondary | ICD-10-CM

## 2022-09-29 ENCOUNTER — Encounter (INDEPENDENT_AMBULATORY_CARE_PROVIDER_SITE_OTHER): Payer: Self-pay | Admitting: Nurse Practitioner

## 2022-09-29 ENCOUNTER — Ambulatory Visit (INDEPENDENT_AMBULATORY_CARE_PROVIDER_SITE_OTHER): Payer: PPO | Admitting: Nurse Practitioner

## 2022-09-29 ENCOUNTER — Ambulatory Visit (INDEPENDENT_AMBULATORY_CARE_PROVIDER_SITE_OTHER): Payer: PPO

## 2022-09-29 VITALS — BP 171/93 | HR 64 | Resp 19 | Ht 71.0 in | Wt 233.2 lb

## 2022-09-29 DIAGNOSIS — I724 Aneurysm of artery of lower extremity: Secondary | ICD-10-CM

## 2022-09-29 DIAGNOSIS — I10 Essential (primary) hypertension: Secondary | ICD-10-CM

## 2022-09-29 DIAGNOSIS — E782 Mixed hyperlipidemia: Secondary | ICD-10-CM

## 2022-09-29 DIAGNOSIS — I890 Lymphedema, not elsewhere classified: Secondary | ICD-10-CM | POA: Diagnosis not present

## 2022-09-29 NOTE — Progress Notes (Signed)
Subjective:    Patient ID: [name]., male    DOB: 08-22-1941, 81 y.o.   MRN: [id number] No chief complaint on file.   Mr ALONZO OWCZARZAK. 81 Y.O male returns to clinic today for follow up of right popliteal artery aneursym with stent placement. He denies any pain or claudication symptoms either at rest or while exercising.  The patient did have swelling postprocedure however now it is at its baseline approximately.  ABI/TBI Today's ABI Today's TBI Previous ABI Previous TBI Right 1.01 0.77 1.08 0.96 Left 1.13 0.70 1.42 1.01 Bilateral ABIs appear essentially unchanged compared to prior study on 08/13/2022. Summary: Right: Resting right ankle-brachial index is within normal range. The right toe-brachial index is normal. Left: Resting left ankle-brachial index is within normal range. The left toe-brachial index is normal.     Review of Systems  Constitutional: Negative.   HENT: Negative.    Eyes: Negative.   Respiratory: Negative.    Cardiovascular:  Positive for leg swelling.  Endocrine: Negative.   Genitourinary: Negative.   Musculoskeletal: Negative.   Skin: Negative.   Allergic/Immunologic: Negative.   Neurological: Negative.   Hematological: Negative.   Psychiatric/Behavioral: Negative.         Objective:   Physical Exam Vitals reviewed.  Constitutional:      Appearance: Normal appearance.  HENT:     Head: Normocephalic.  Cardiovascular:     Rate and Rhythm: Normal rate and regular rhythm.     Pulses: Normal pulses.  Pulmonary:     Effort: Pulmonary effort is normal.  Abdominal:     General: Abdomen is flat. Bowel sounds are normal.     Palpations: Abdomen is soft.  Musculoskeletal:        General: Normal range of motion.     Right lower leg: Edema present.     Left lower leg: Edema present.  Skin:    General: Skin is warm and dry.  Neurological:     General: No focal deficit present.     Mental Status: He is alert and oriented to person,  place, and time.  Psychiatric:        Mood and Affect: Mood normal.        Behavior: Behavior normal.     BP (!) 171/93 (BP Location: Left Arm)   Pulse 64   Resp 19   Ht _0  (1.803 m)   Wt 233 lb 3.2 oz (105.8 kg)   BMI 32.52 kg/m   Past Medical History:  Diagnosis Date   Allergy    Arthritis    Coronary artery disease    COVID-19 11/19/2019   Dysrhythmia    Hypertension    Peripheral vascular disease (HCC)    Pulmonary fibrosis (HCC)    Sleep apnea    CPAP   Tremor     Social History   Socioeconomic History   Marital status: Married    Spouse name: Not on file   Number of children: Not on file   Years of education: Not on file   Highest education level: Not on file  Occupational History   Not on file  Tobacco Use   Smoking status: Former    Types: Cigarettes    Quit date: 02/16/1979    Years since quitting: 43.6   Smokeless tobacco: Never  Vaping Use   Vaping Use: Never used  Substance and Sexual Activity   Alcohol use: Yes    Comment: rarely   Drug use:  No   Sexual activity: Not on file  Other Topics Concern   Not on file  Social History Narrative   Not on file   Social Determinants of Health   Financial Resource Strain: Not on file  Food Insecurity: Not on file  Transportation Needs: Not on file  Physical Activity: Not on file  Stress: Not on file  Social Connections: Not on file  Intimate Partner Violence: Not on file    Past Surgical History:  Procedure Laterality Date   CARDIAC SURGERY     CARDIOVERSION N/A 03/03/2018   Procedure: CARDIOVERSION;  Surgeon: [name], MD;  Location: [facility];  Service: Cardiovascular;  Laterality: N/A;   CARDIOVERSION N/A 05/24/2018   Procedure: CARDIOVERSION;  Surgeon: [name], MD;  Location: [facility];  Service: Cardiovascular;  Laterality: N/A;   CATARACT EXTRACTION W/PHACO Right 06/03/2020   Procedure: CATARACT EXTRACTION PHACO AND INTRAOCULAR LENS PLACEMENT ([place]) RIGHT 6.07   00:48.0;  Surgeon: Eulogio Bear, MD;  Location: [facility];  Service: Ophthalmology;  Laterality: Right;  sleep apnea   CORONARY ARTERY BYPASS GRAFT     EYE SURGERY     LEG SURGERY Left    stents placed, Fem-Fem bypass   LOWER EXTREMITY ANGIOGRAPHY Left 09/01/2022   Procedure: Lower Extremity Angiography;  Surgeon: Katha Cabal, MD;  Location: [facility];  Service: Cardiovascular;  Laterality: Left;   RIGHT/LEFT HEART CATH AND CORONARY ANGIOGRAPHY Bilateral 02/12/2022   Procedure: RIGHT/LEFT HEART CATH AND CORONARY ANGIOGRAPHY;  Surgeon: [name], MD;  Location: [facility];  Service: Cardiovascular;  Laterality: Bilateral;   TONSILLECTOMY      Family History  Problem Relation Age of Onset   Thyroid disease Mother    Macular degeneration Mother    Cancer Father     Allergies  Allergen Reactions   Fish Allergy Hives   Hydrocodone     Other reaction(s): Other (See Comments) confusion   Tramadol Other (See Comments)    confusion   Hydrochlorothiazide Other (See Comments)    worsens gout   Iodine Rash   Shellfish Allergy Rash       Latest Ref Rng & Units 02/16/2022    3:11 AM 02/15/2022   10:06 PM 02/15/2022    7:15 PM  CBC  WBC 4.0 - 10.5 K/uL 8.2  8.3  9.2   Hemoglobin 13.0 - 17.0 g/dL 9.3  9.3  10.1   Hematocrit 39.0 - 52.0 % 28.8  28.7  32.5   Platelets 150 - 400 K/uL 149  138  156       CMP     Component Value Date/Time   NA 140 02/16/2022 0311   NA 138 01/28/2012 0550   K 3.7 02/16/2022 0311   K 4.4 01/28/2012 0550   CL 106 02/16/2022 0311   CL 103 01/28/2012 0550   CO2 27 02/16/2022 0311   CO2 26 01/28/2012 0550   GLUCOSE 114 (H) 02/16/2022 0311   GLUCOSE 110 (H) 01/28/2012 0550   BUN 19 09/01/2022 0725   BUN 19 (H) 01/28/2012 0550   CREATININE 0.93 09/01/2022 0725   CREATININE 0.85 01/28/2012 0550   CALCIUM 8.3 (L) 02/16/2022 0311   CALCIUM 8.7 01/28/2012 0550   PROT 6.1 (L) 01/24/2012 0356   ALBUMIN 3.0  (L) 01/24/2012 0356   AST 45 (H) 01/24/2012 0356   ALT 40 01/24/2012 0356   ALKPHOS 63 01/24/2012 0356   BILITOT 0.6 01/24/2012 0356  GFRNONAA >60 09/01/2022 0725   GFRNONAA >60 01/28/2012 0550   GFRAA >60 01/28/2012 0550     VAS [place] ABI WITH/WO TBI  Result Date: 08/13/2022  LOWER EXTREMITY DOPPLER STUDY Patient Name:  TOBI LEINWEBER  Date of Exam:   08/13/2022 Medical Rec #: [id number]           Accession #:    [id number] Date of Birth: 1941/10/18            Patient Gender: M Patient Age:   50 years Exam Location:  [facility] Procedure:      VAS [place] ABI WITH/WO TBI Referring Phys: Hortencia Pilar --------------------------------------------------------------------------------  Indications: Peripheral artery disease.  Comparison Study: 08/14/2021 Performing Technologist: Almira Coaster RVS  Examination Guidelines: A complete evaluation includes at minimum, Doppler waveform signals and systolic blood pressure reading at the level of bilateral brachial, anterior tibial, and posterior tibial arteries, when vessel segments are accessible. Bilateral testing is considered an integral part of a complete examination. Photoelectric Plethysmograph (PPG) waveforms and toe systolic pressure readings are included as required and additional duplex testing as needed. Limited examinations for reoccurring indications may be performed as noted.  ABI Findings: +---------+------------------+-----+---------+--------+ Right    Rt Pressure (mmHg)IndexWaveform Comment  +---------+------------------+-----+---------+--------+ Brachial 158                                      +---------+------------------+-----+---------+--------+ ATA      172               1.08 triphasic         +---------+------------------+-----+---------+--------+ PTA      153               0.96 triphasic         +---------+------------------+-----+---------+--------+ Great Toe153               0.96 Normal             +---------+------------------+-----+---------+--------+ +---------+------------------+-----+---------+-------+ Left     Lt Pressure (mmHg)IndexWaveform Comment +---------+------------------+-----+---------+-------+ Brachial 160                                     +---------+------------------+-----+---------+-------+ ATA      200               1.25 triphasic        +---------+------------------+-----+---------+-------+ PTA      227               1.42 triphasic        +---------+------------------+-----+---------+-------+ Great Toe162               1.01 Normal           +---------+------------------+-----+---------+-------+ +-------+-----------+-----------+------------+------------+ ABI/TBIToday's ABIToday's TBIPrevious ABIPrevious TBI +-------+-----------+-----------+------------+------------+ Right  1.08       .96        1.03        1.09         +-------+-----------+-----------+------------+------------+ Left   1.25       1.01       1.16        .77          +-------+-----------+-----------+------------+------------+ Bilateral ABIs appear essentially unchanged.  Summary: Right: Resting right ankle-brachial index is within normal range. The right toe-brachial index is normal. Left: Resting left ankle-brachial index is within normal  range. The left toe-brachial index is normal. *See table(s) above for measurements and observations.  Electronically signed by Hortencia Pilar MD on 08/13/2022 at 4:30:28 PM.    Final        Assessment & Plan:   1. 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 Will see back in clinic in 3 months with Vascular Ultrasound of lower extremities with ABI and Vascular Ultrasound of the left lower extremity Arterial Duplex.  Patient informed if any symptoms of claudication return as he has had prior to call us and come se [place] asap.   2. Lymphedema The patient returns to the office for followup evaluation regarding leg  swelling.  The swelling has improved quite a bit and the pain associated with swelling has decreased substantially. There have not been any interval development of a ulcerations or wounds.  Since the previous visit the patient has been wearing graduated compression stockings and has noted some improvement in the lymphedema. The patient has been using compression routinely morning until night.  The patient also states elevation during the day and exercise (such as riding bike at the gym) is being done too.    3. Mixed hyperlipidemia Continue statin as ordered and reviewed, no changes at this time   4. Benign essential hypertension Continue antihypertensive medications as already ordered, these medications have been reviewed and there are no changes at this time.    Current Outpatient Medications on File Prior to Visit  Medication Sig Dispense Refill   acetaminophen (TYLENOL) 650 MG CR tablet Take 1,300 mg by mouth every 8 (eight) hours as needed for pain.      ALPRAZolam (XANAX) 0.25 MG tablet Take 0.25 mg by mouth 3 (three) times daily as needed for anxiety.  5   aspirin EC 81 MG tablet Take 1 tablet (81 mg total) by mouth daily. Swallow whole. 150 tablet 2   atorvastatin (LIPITOR) 40 MG tablet Take 40 mg by mouth daily.     diltiazem (CARDIZEM CD) 120 MG 24 hr capsule Take 120 mg by mouth 2 (two) times daily.     ELIQUIS 5 MG TABS tablet Take 1 tablet by mouth in the morning and at bedtime.     escitalopram (LEXAPRO) 5 MG tablet Take 5 mg by mouth daily.     furosemide (LASIX) 40 MG tablet Take 40 mg by mouth daily.     levothyroxine (SYNTHROID) 25 MCG tablet Take 25 mcg by mouth every morning.     metoprolol succinate (TOPROL-XL) 50 MG 24 hr tablet Take 50 mg by mouth daily.     Multiple Vitamin (MULTIVITAMIN) tablet Take 1 tablet by mouth daily. Centrum for men     Pirfenidone 801 MG TABS Take 801 mg by mouth 3 (three) times daily.     spironolactone (ALDACTONE) 25 MG tablet Take 12.5  mg by mouth daily.     telmisartan (MICARDIS) 40 MG tablet Take 40 mg by mouth daily.     torsemide (DEMADEX) 10 MG tablet Take 10 mg by mouth daily.     Treprostinil (TYVASO DPI MAINTENANCE KIT) 16 MCG POWD Inhale 16 % into the lungs 4 (four) times daily.     No current facility-administered medications on file prior to visit.    There are no Patient Instructions on file for this visit. No follow-ups on file.   Kris Hartmann, NP

## 2022-10-05 ENCOUNTER — Encounter (INDEPENDENT_AMBULATORY_CARE_PROVIDER_SITE_OTHER): Payer: Self-pay

## 2022-10-05 DIAGNOSIS — J8410 Pulmonary fibrosis, unspecified: Secondary | ICD-10-CM | POA: Diagnosis not present

## 2022-10-26 DIAGNOSIS — H3530 Unspecified macular degeneration: Secondary | ICD-10-CM | POA: Diagnosis not present

## 2022-11-24 DIAGNOSIS — J9611 Chronic respiratory failure with hypoxia: Secondary | ICD-10-CM | POA: Diagnosis not present

## 2022-11-27 DIAGNOSIS — J449 Chronic obstructive pulmonary disease, unspecified: Secondary | ICD-10-CM | POA: Diagnosis not present

## 2022-11-27 DIAGNOSIS — J9611 Chronic respiratory failure with hypoxia: Secondary | ICD-10-CM | POA: Diagnosis not present

## 2022-12-08 DIAGNOSIS — G4730 Sleep apnea, unspecified: Secondary | ICD-10-CM | POA: Diagnosis not present

## 2022-12-18 DIAGNOSIS — L819 Disorder of pigmentation, unspecified: Secondary | ICD-10-CM | POA: Diagnosis not present

## 2022-12-18 DIAGNOSIS — L570 Actinic keratosis: Secondary | ICD-10-CM | POA: Diagnosis not present

## 2022-12-30 DIAGNOSIS — I7143 Infrarenal abdominal aortic aneurysm, without rupture: Secondary | ICD-10-CM | POA: Insufficient documentation

## 2022-12-30 DIAGNOSIS — I714 Abdominal aortic aneurysm, without rupture, unspecified: Secondary | ICD-10-CM | POA: Insufficient documentation

## 2022-12-30 NOTE — Progress Notes (Signed)
MRN : [id number]  [name]. is a 82 y.o. (01-18-1941) male who presents with chief complaint of check circulation.  History of Present Illness:   The patient returns to the office for followup and review status post angiogram with intervention on 09/01/2022.  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  2.          Popliteal artery aneurysm.  3.          Abdominal aortic aneurysm.   Procedure 01/20/2012:        Percutaneous transluminal angioplasty of the peroneal artery to 3 mm.  2.          Percutaneous transluminal angioplasty of the posterior tibial artery to 3 mm.  3.          Percutaneous transluminal angioplasty of the superficial femoral and vein bypass graft to 8 mm.   The patient notes improvement in the lower extremity symptoms. No interval shortening of the patient's claudication distance or rest pain symptoms. No new ulcers or wounds have occurred since the last visit.  There have been no significant changes to the patient's overall health care.  No documented history of amaurosis fugax or recent TIA symptoms. There are no recent neurological changes noted. No documented history of DVT, PE or superficial thrombophlebitis. The patient denies recent episodes of angina or shortness of breath.   ABI's Rt=1.04 and Lt=1.17  (previous ABI's Rt=1.01 and Lt=1.13)  Duplex US of the left lower extremity arterial system shows uniform velocities without evidence of hemodynamically significant stenosis.  No outpatient medications have been marked as taking for the 12/31/22 encounter (Appointment) with Delana Meyer, Dolores Lory, MD.    Past Medical History:  Diagnosis Date   Allergy    Arthritis    Coronary artery disease    COVID-19  11/19/2019   Dysrhythmia    Hypertension    Peripheral vascular disease ([place])    Pulmonary fibrosis ([place])    Sleep apnea    CPAP   Tremor     Past Surgical History:  Procedure Laterality Date   CARDIAC SURGERY     CARDIOVERSION N/A 03/03/2018   Procedure: CARDIOVERSION;  Surgeon: [name], MD;  Location: [facility];  Service: Cardiovascular;  Laterality: N/A;   CARDIOVERSION N/A 05/24/2018   Procedure: CARDIOVERSION;  Surgeon: [name], MD;  Location: [facility];  Service: Cardiovascular;  Laterality: N/A;   CATARACT EXTRACTION W/PHACO Right 06/03/2020   Procedure: CATARACT EXTRACTION PHACO AND INTRAOCULAR LENS PLACEMENT ([place]) RIGHT 6.07  00:48.0;  Surgeon: Eulogio Bear, MD;  Location: [facility];  Service: Ophthalmology;  Laterality: Right;  sleep apnea   CORONARY ARTERY BYPASS GRAFT     EYE SURGERY     LEG SURGERY Left    stents placed, Fem-Fem bypass   LOWER EXTREMITY ANGIOGRAPHY Left 09/01/2022   Procedure: Lower Extremity Angiography;  Surgeon: Katha Cabal, MD;  Location: [facility];  Service: Cardiovascular;  Laterality: Left;   RIGHT/LEFT HEART CATH AND CORONARY ANGIOGRAPHY Bilateral 02/12/2022   Procedure: RIGHT/LEFT HEART CATH AND CORONARY ANGIOGRAPHY;  Surgeon: [name], MD;  Location: [facility];  Service: Cardiovascular;  Laterality:  Bilateral;   TONSILLECTOMY      Social History Social History   Tobacco Use   Smoking status: Former    Types: Cigarettes    Quit date: 02/16/1979    Years since quitting: 43.8   Smokeless tobacco: Never  Vaping Use   Vaping Use: Never used  Substance Use Topics   Alcohol use: Yes    Comment: rarely   Drug use: No    Family History Family History  Problem Relation Age of Onset   Thyroid disease Mother    Macular degeneration Mother    Cancer Father     Allergies  Allergen Reactions   Fish Allergy Hives   Hydrocodone     Other reaction(s): Other (See  Comments) confusion   Tramadol Other (See Comments)    confusion   Hydrochlorothiazide Other (See Comments)    worsens gout   Iodine Rash   Shellfish Allergy Rash     REVIEW OF SYSTEMS (Negative unless checked)  Constitutional: _0 Weight loss  _1 Fever  _2 Chills Cardiac: _3 Chest pain   _4 Chest pressure   _5 Palpitations   _6 Shortness of breath when laying flat   _7 Shortness of breath with exertion. Vascular:  _8 Pain in legs with walking   _9 Pain in legs at rest  _10 History of DVT   _11 Phlebitis   _12 Swelling in legs   _13 Varicose veins   _14 Non-healing ulcers Pulmonary:   _15 Uses home oxygen   _16 Productive cough   _17 Hemoptysis   _18 Wheeze  _19 COPD   _20 Asthma Neurologic:  _21 Dizziness   _22 Seizures   _23 History of stroke   _24 History of TIA  _25 Aphasia   _26 Vissual changes   _27 Weakness or numbness in arm   _28 Weakness or numbness in leg Musculoskeletal:   _29 Joint swelling   _30 Joint pain   _31 Low back pain Hematologic:  _32 Easy bruising  _33 Easy bleeding   _34 Hypercoagulable state   _35 Anemic Gastrointestinal:  _36 Diarrhea   _37 Vomiting  _38 Gastroesophageal reflux/heartburn   _39 Difficulty swallowing. Genitourinary:  _40 Chronic kidney disease   _41 Difficult urination  _42 Frequent urination   _43 Blood in urine Skin:  _44 Rashes   _45 Ulcers  Psychological:  _46 History of anxiety   _47  History of major depression.  Physical Examination  There were no vitals filed for this visit. There is no height or weight on file to calculate BMI. Gen: WD/WN, NAD Head: [place]/AT, No temporalis wasting.  Ear/Nose/Throat: Hearing grossly intact, nares w/o erythema or drainage Eyes: PER, EOMI, sclera nonicteric.  Neck: Supple, no masses.  No bruit or JVD.  Pulmonary:  Good air movement, no audible wheezing, no use of accessory muscles.  Cardiac: RRR, normal S1, S2, no Murmurs. Vascular:  mild trophic changes, no open wounds Vessel Right Left  Radial Palpable Palpable  PT Palpable Palpable  DP Palpable Palpable  Gastrointestinal: soft,  non-distended. No guarding/no peritoneal signs.  Musculoskeletal: M/S 5/5 throughout.  No visible deformity.  Neurologic: CN 2-12 intact. Pain and light touch intact in extremities.  Symmetrical.  Speech is fluent. Motor exam as listed above. Psychiatric: Judgment intact, Mood & affect appropriate for pt's clinical situation. Dermatologic: No rashes or ulcers noted.  No changes consistent with cellulitis.   CBC Lab Results  Component Value Date   WBC 8.2 02/16/2022   HGB 9.3 (L) 02/16/2022   HCT 28.8 (L) 02/16/2022   MCV 91.7 02/16/2022   PLT 149 (L) 02/16/2022    BMET    Component Value Date/Time   NA 140 02/16/2022 0311   NA 138 01/28/2012 0550   K 3.7 02/16/2022 1610  K 4.4 01/28/2012 0550   CL 106 02/16/2022 0311   CL 103 01/28/2012 0550   CO2 27 02/16/2022 0311   CO2 26 01/28/2012 0550   GLUCOSE 114 (H) 02/16/2022 0311   GLUCOSE 110 (H) 01/28/2012 0550   BUN 19 09/01/2022 0725   BUN 19 (H) 01/28/2012 0550   CREATININE 0.93 09/01/2022 0725   CREATININE 0.85 01/28/2012 0550   CALCIUM 8.3 (L) 02/16/2022 0311   CALCIUM 8.7 01/28/2012 0550   GFRNONAA >60 09/01/2022 0725   GFRNONAA >60 01/28/2012 0550   GFRAA >60 01/28/2012 0550   CrCl cannot be calculated (Patient's most recent lab result is older than the maximum 21 days allowed.).  COAG Lab Results  Component Value Date   INR 1.4 (H) 02/15/2022   INR 1.45 09/09/2015   INR 1.9 01/29/2012    Radiology No results found.   Assessment/Plan 1. Infrarenal abdominal aortic aneurysm (AAA) without rupture (HCC) Recommend: No surgery or intervention is indicated at this time.  The patient has an asymptomatic abdominal aortic aneurysm that is less than 4 cm in maximal diameter.    I have reviewed the natural history of abdominal aortic aneurysm and the small risk of rupture for aneurysm less than 5 cm in size.  However, as these small aneurysms tend to enlarge over time, continued surveillance with ultrasound or  CT scan is mandatory.   I have also discussed optimizing medical management with hypertension and lipid control and the importance of abstinence from tobacco.  The patient is also encouraged to exercise a minimum of 30 minutes 4 times a week.   Should the patient develop new onset abdominal or back pain or signs of peripheral embolization they are instructed to seek medical attention immediately and to alert the physician providing care that they have an aneurysm.   The patient voices their understanding.  The patient will return in 12 months with an aortic duplex. - VAS US AORTA/IVC/ILIACS; Future  2. Popliteal artery aneurysm (HCC) No surgery or intervention at this time.   The patient has an asymptomatic popliteal artery aneurysm that is less than 2.5 cm in maximal diameter.  I have discussed the natural history of popliteal aneurysm and the small risk of thrombosis for aneurysm less than 2.5 cm in size.  However, as these small aneurysms tend to enlarge over time, continued surveillance with ultrasound is mandatory.   I have also discussed optimizing medical management with hypertension and lipid control and the importance of abstinence from tobacco.  The patient is also encouraged to exercise a minimum of 30 minutes 4 times a week.    Should the patient develop new leg pain or signs of peripheral embolization they are instructed to seek medical attention immediately and to alert the physician providing care that they have an aneurysm.  The patient voices their understanding.  - VAS [place] LOWER EXTREMITY ARTERIAL DUPLEX; Future  3. Atherosclerosis of native artery of both lower extremities with intermittent claudication (HCC)  Recommend:  The patient has evidence of atherosclerosis of the lower extremities with claudication.  The patient does not voice lifestyle limiting changes at this point in time.  Noninvasive studies do not suggest clinically significant change.  No invasive  studies, angiography or surgery at this time The patient should continue walking and begin a more formal exercise program.  The patient should continue antiplatelet therapy and aggressive treatment of the lipid abnormalities  No changes in the patient's medications at this time  Continued surveillance is indicated  as atherosclerosis is likely to progress with time.    The patient will continue follow up with noninvasive studies as ordered.  - VAS [place] LOWER EXTREMITY ARTERIAL DUPLEX; Future  4. Bilateral carotid artery stenosis Recommend:  Given the patient's asymptomatic subcritical stenosis no further invasive testing or surgery at this time.  Duplex ultrasound shows 1-39% stenosis bilaterally.  Continue antiplatelet therapy as prescribed Continue management of CAD, HTN and Hyperlipidemia Healthy heart diet,  encouraged exercise at least 4 times per week Follow up in 6 months with duplex ultrasound and physical exam  - VAS US CAROTID; Future  5. Coronary artery disease of native artery of native heart with stable angina pectoris (HCC) Continue cardiac and antihypertensive medications as already ordered and reviewed, no changes at this time.  Continue statin as ordered and reviewed, no changes at this time  Nitrates PRN for chest pain  6. Benign essential hypertension Continue antihypertensive medications as already ordered, these medications have been reviewed and there are no changes at this time.    Hortencia Pilar, MD  12/30/2022 10:58 AM

## 2022-12-31 ENCOUNTER — Ambulatory Visit (INDEPENDENT_AMBULATORY_CARE_PROVIDER_SITE_OTHER): Payer: PPO

## 2022-12-31 ENCOUNTER — Encounter (INDEPENDENT_AMBULATORY_CARE_PROVIDER_SITE_OTHER): Payer: Self-pay | Admitting: Vascular Surgery

## 2022-12-31 ENCOUNTER — Ambulatory Visit (INDEPENDENT_AMBULATORY_CARE_PROVIDER_SITE_OTHER): Payer: PPO | Admitting: Vascular Surgery

## 2022-12-31 VITALS — BP 118/72 | HR 58 | Ht 71.0 in | Wt 233.0 lb

## 2022-12-31 DIAGNOSIS — I70213 Atherosclerosis of native arteries of extremities with intermittent claudication, bilateral legs: Secondary | ICD-10-CM | POA: Diagnosis not present

## 2022-12-31 DIAGNOSIS — I724 Aneurysm of artery of lower extremity: Secondary | ICD-10-CM | POA: Diagnosis not present

## 2022-12-31 DIAGNOSIS — I7143 Infrarenal abdominal aortic aneurysm, without rupture: Secondary | ICD-10-CM

## 2022-12-31 DIAGNOSIS — I10 Essential (primary) hypertension: Secondary | ICD-10-CM | POA: Diagnosis not present

## 2022-12-31 DIAGNOSIS — I25118 Atherosclerotic heart disease of native coronary artery with other forms of angina pectoris: Secondary | ICD-10-CM

## 2022-12-31 DIAGNOSIS — I6523 Occlusion and stenosis of bilateral carotid arteries: Secondary | ICD-10-CM

## 2023-01-02 ENCOUNTER — Encounter (INDEPENDENT_AMBULATORY_CARE_PROVIDER_SITE_OTHER): Payer: Self-pay | Admitting: Vascular Surgery

## 2023-01-04 LAB — VAS US ABI WITH/WO TBI
Left ABI: 1.17
Right ABI: 1.04

## 2023-01-07 DIAGNOSIS — M1A9XX0 Chronic gout, unspecified, without tophus (tophi): Secondary | ICD-10-CM | POA: Diagnosis not present

## 2023-01-07 DIAGNOSIS — R7303 Prediabetes: Secondary | ICD-10-CM | POA: Diagnosis not present

## 2023-01-07 DIAGNOSIS — D692 Other nonthrombocytopenic purpura: Secondary | ICD-10-CM | POA: Diagnosis not present

## 2023-01-07 DIAGNOSIS — D6869 Other thrombophilia: Secondary | ICD-10-CM | POA: Diagnosis not present

## 2023-01-07 DIAGNOSIS — E782 Mixed hyperlipidemia: Secondary | ICD-10-CM | POA: Diagnosis not present

## 2023-01-12 ENCOUNTER — Telehealth: Payer: Self-pay | Admitting: *Deleted

## 2023-01-12 NOTE — Patient Outreach (Signed)
  Care Coordination   Initial Visit Note   01/12/2023 Name: [name]. MRN: [id number] DOB: 03-23-1941  [name]. is a 82 y.o. year old male who sees Adin Hector, MD for primary care. I spoke with  [name]. by phone today.  What matters to the patients health and wellness today?  Patient state he does not have time to talk, request call back.   SDOH assessments and interventions completed:  No     Care Coordination Interventions:  No, not indicated   Follow up plan: Follow up call scheduled for 2/7    Encounter Outcome:  Pt. Request to Call [place], RN, MSN, [place] Management Care Management Coordinator [phone]

## 2023-01-13 ENCOUNTER — Telehealth: Payer: Self-pay | Admitting: *Deleted

## 2023-01-13 NOTE — Patient Outreach (Signed)
  Care Coordination   01/13/2023 Name: [name]. MRN: [id number] DOB: 12/30/1940   Care Coordination Outreach Attempts:  A second unsuccessful outreach was attempted today to offer the patient with information about available care coordination services as a benefit of their health plan.     Follow Up Plan:  Additional outreach attempts will be made to offer the patient care coordination information and services.   Encounter Outcome:  No Answer   Care Coordination Interventions:  No, not indicated    Valente David, RN, MSN, [facility] Care Management Care Management Coordinator [phone]

## 2023-01-14 DIAGNOSIS — M1A9XX0 Chronic gout, unspecified, without tophus (tophi): Secondary | ICD-10-CM | POA: Diagnosis not present

## 2023-01-14 DIAGNOSIS — R7303 Prediabetes: Secondary | ICD-10-CM | POA: Diagnosis not present

## 2023-01-14 DIAGNOSIS — I724 Aneurysm of artery of lower extremity: Secondary | ICD-10-CM | POA: Diagnosis not present

## 2023-01-14 DIAGNOSIS — J8410 Pulmonary fibrosis, unspecified: Secondary | ICD-10-CM | POA: Diagnosis not present

## 2023-01-14 DIAGNOSIS — E782 Mixed hyperlipidemia: Secondary | ICD-10-CM | POA: Diagnosis not present

## 2023-01-14 DIAGNOSIS — J449 Chronic obstructive pulmonary disease, unspecified: Secondary | ICD-10-CM | POA: Diagnosis not present

## 2023-01-14 DIAGNOSIS — I77810 Thoracic aortic ectasia: Secondary | ICD-10-CM | POA: Diagnosis not present

## 2023-01-14 DIAGNOSIS — J9611 Chronic respiratory failure with hypoxia: Secondary | ICD-10-CM | POA: Diagnosis not present

## 2023-01-14 DIAGNOSIS — I739 Peripheral vascular disease, unspecified: Secondary | ICD-10-CM | POA: Diagnosis not present

## 2023-01-14 DIAGNOSIS — D6869 Other thrombophilia: Secondary | ICD-10-CM | POA: Diagnosis not present

## 2023-01-14 DIAGNOSIS — I4819 Other persistent atrial fibrillation: Secondary | ICD-10-CM | POA: Diagnosis not present

## 2023-01-14 DIAGNOSIS — I10 Essential (primary) hypertension: Secondary | ICD-10-CM | POA: Diagnosis not present

## 2023-01-18 ENCOUNTER — Encounter: Payer: Self-pay | Admitting: *Deleted

## 2023-01-18 ENCOUNTER — Telehealth: Payer: Self-pay | Admitting: *Deleted

## 2023-01-18 NOTE — Patient Outreach (Signed)
  Care Coordination   Initial Visit Note   01/18/2023 Name: [name]. MRN: [id number] DOB: 1941-11-25  [name]. is a 82 y.o. year old male who sees Adin Hector, MD for primary care. I spoke with  [name]. by phone today.  What matters to the patients health and wellness today?  Keeping pulmonary fibrosis managed, decreasing episodes with shortness of breath.     Goals Addressed             This Visit's Progress    Effective management of pulmonary fibrosis       Care Coordination Interventions: Evaluation of current treatment plan related to pulmonary fibrosis and patient's adherence to plan as established by provider Advised patient to Notify MD if he does not feel newly prescribed inhaler/nebulizers are working Provided education to patient re: using exercise to increase tolerance and strength as well as a way to expand lungs Reviewed medications with patient and discussed adherence and affordability.  State he may need assistance with Eliquis in the future.  Was just recently placed on Levothyroxine, educated on when to take (early morning, prior to other meds and food) Reviewed scheduled/upcoming provider appointments including PFT on 3/6 with follow up with pulmonary provider after Discussed plans with patient for ongoing care management follow up and provided patient with direct contact information for care management team Screening for signs and symptoms of depression related to chronic disease state  Assessed social determinant of health barriers Discussed use of home O2 and CPAP, state he uses both every night         SDOH assessments and interventions completed:  Yes  SDOH Interventions Today    Flowsheet Row Most Recent Value  SDOH Interventions   Food Insecurity Interventions Intervention Not Indicated  Housing Interventions Intervention Not Indicated  Transportation Interventions Intervention Not Indicated         Care Coordination Interventions:  Yes, provided   Follow up plan: Follow up call scheduled for 3/8    Encounter Outcome:  Pt. Visit Completed   Valente David, RN, MSN, [place] Care Management Care Management Coordinator [phone]

## 2023-01-18 NOTE — Patient Instructions (Signed)
Visit Information  Thank you for taking time to visit with me today. Please don't hesitate to contact me if I can be of assistance to you before our next scheduled telephone appointment.  Following are the goals we discussed today:  Continue trying new inhaler/nebulizers and notify provider if they are not working.  Remember appointment for pulmonary function tests on 3/6.  Our next appointment is by telephone on 3/8  Please call the care guide team at [phone] if you need to cancel or reschedule your appointment.   Please call the Suicide and Crisis Lifeline: 988 call the [place] National Suicide Prevention Lifeline: (279)611-5716 or TTY: (506)200-1397 TTY (640)623-4983) to talk to a trained counselor call 1-800-273-TALK (toll free, 24 hour hotline) call 911 if you are experiencing a Mental Health or [place] or need someone to talk to.  Patient verbalizes understanding of instructions and care plan provided today and agrees to view in [facility]. Active MyChart status and patient understanding of how to access instructions and care plan via MyChart confirmed with patient.     The patient has been provided with contact information for the care management team and has been advised to call with any health related questions or concerns.   Valente David, RN, MSN, [place] Care Management Care Management Coordinator [phone]

## 2023-02-09 DIAGNOSIS — J9611 Chronic respiratory failure with hypoxia: Secondary | ICD-10-CM | POA: Diagnosis not present

## 2023-02-12 ENCOUNTER — Ambulatory Visit: Payer: Self-pay | Admitting: *Deleted

## 2023-02-12 NOTE — Patient Outreach (Signed)
  Care Coordination   Follow Up Visit Note   02/12/2023 Name: [name]. MRN: [id number] DOB: 01-24-1941  [name]. is a 82 y.o. year old male who sees Collin Hector, MD for primary care. I spoke with  [name]. by phone today.  What matters to the patients health and wellness today?  Managing pulmonary fibrosis.  Was unable to have PFT's done,  but was seen by pulmonary on 3/5.  Denies any shortness of breath today.    Goals Addressed             This Visit's Progress    Effective management of pulmonary fibrosis   On track    Care Coordination Interventions: Evaluation of current treatment plan related to pulmonary fibrosis and patient's adherence to plan as established by provider Advised patient to decrease snacking, especially Fritos as they are high in sodium Provided education to patient re: using exercise to increase tolerance and strength as well as a way to expand lungs Reviewed medications with patient and discussed adherence and affordability.  Torsemide was increased and Prednisone added Reviewed scheduled/upcoming provider appointments including PFT were not performed due to difficulty breathing and increased fluids.  Will continue with follow up with PCP and pulmonary  Discussed plans with patient for ongoing care management follow up and provided patient with direct contact information for care management team Discussed use of home O2 and CPAP, state he uses both every night. Oxygen saturations range 88-94% Discussed ongoing weight maintenance, will keep going to the gym as tolerated.           SDOH assessments and interventions completed:  No     Care Coordination Interventions:  Yes, provided   Follow up plan: Follow up call scheduled for 4/4    Encounter Outcome:  Pt. Visit Completed   Valente David, RN, MSN, [place] Care Management Care Management Coordinator [phone]

## 2023-03-11 ENCOUNTER — Ambulatory Visit: Payer: Self-pay | Admitting: *Deleted

## 2023-03-11 DIAGNOSIS — I77810 Thoracic aortic ectasia: Secondary | ICD-10-CM | POA: Diagnosis not present

## 2023-03-11 DIAGNOSIS — S61432A Puncture wound without foreign body of left hand, initial encounter: Secondary | ICD-10-CM | POA: Diagnosis not present

## 2023-03-11 DIAGNOSIS — Z2821 Immunization not carried out because of patient refusal: Secondary | ICD-10-CM | POA: Diagnosis not present

## 2023-03-11 DIAGNOSIS — I739 Peripheral vascular disease, unspecified: Secondary | ICD-10-CM | POA: Diagnosis not present

## 2023-03-11 DIAGNOSIS — J8410 Pulmonary fibrosis, unspecified: Secondary | ICD-10-CM | POA: Diagnosis not present

## 2023-03-11 DIAGNOSIS — J9611 Chronic respiratory failure with hypoxia: Secondary | ICD-10-CM | POA: Diagnosis not present

## 2023-03-11 DIAGNOSIS — I4819 Other persistent atrial fibrillation: Secondary | ICD-10-CM | POA: Diagnosis not present

## 2023-03-11 DIAGNOSIS — S61442A Puncture wound with foreign body of left hand, initial encounter: Secondary | ICD-10-CM | POA: Diagnosis not present

## 2023-03-11 DIAGNOSIS — Z23 Encounter for immunization: Secondary | ICD-10-CM | POA: Diagnosis not present

## 2023-03-11 DIAGNOSIS — I724 Aneurysm of artery of lower extremity: Secondary | ICD-10-CM | POA: Diagnosis not present

## 2023-03-11 NOTE — Patient Outreach (Signed)
  Care Coordination   Follow Up Visit Note   03/11/2023 Name: [name]. MRN: [id number] DOB: [birth year]  [name]. is a 82 y.o. year old male who sees Adin Hector, MD for primary care. I spoke with  [name]. by phone today.  What matters to the patients health and wellness today?  Patient report he is currently at the store, unable to talk and will call this care manager back.    Update 1510:  Call placed back to patient, no answer, voice message left.     SDOH assessments and interventions completed:  No     Care Coordination Interventions:  No, not indicated   Follow up plan:  Will have care guide reschedule.     Encounter Outcome:  Pt. Request to Call [place], RN, MSN, [place] Management Care Management Coordinator [phone]

## 2023-03-18 ENCOUNTER — Ambulatory Visit: Payer: Self-pay | Admitting: *Deleted

## 2023-03-18 NOTE — Patient Outreach (Signed)
  Care Coordination   Follow Up Visit Note   03/18/2023 Name: [name]. MRN: [id number] DOB: 08-12-1941  [name]. is a 82 y.o. year old male who sees Lynnea Ferrier, MD for primary care. I spoke with  [name]. by phone today.  What matters to the patients health and wellness today?  Had incident with staple gun causing wound on hand, state this has resolved.  He is also still working to improve lung health.      Goals Addressed             This Visit's Progress    Effective management of pulmonary fibrosis   On track    Care Coordination Interventions: Evaluation of current treatment plan related to pulmonary fibrosis and patient's adherence to plan as established by provider Provided education to patient re: using exercise to increase tolerance and strength as well as a way to expand lungs Reviewed medications with patient and discussed adherence and affordability.   Reviewed scheduled/upcoming provider appointments including Pulmonary on 7/2 and vascular on 7/22  Discussed plans with patient for ongoing care management follow up and provided patient with direct contact information for care management team Discussed use of home O2 and CPAP, state he uses both every night. Oxygen saturations range 88-94%.  State there are times during the day when he is short of breath and oxygen levels decrease to low 80s, encouraged to use oxygen PRN during these times.  He does have oxygen concentrator as well as a portable one to use when out of the home Discussed ongoing weight maintenance, will keep going to the gym as tolerated.           SDOH assessments and interventions completed:  No     Care Coordination Interventions:  Yes, provided   Interventions Today    Flowsheet Row Most Recent Value  Chronic Disease   Chronic disease during today's visit Chronic Obstructive Pulmonary Disease (COPD), Other  [pulmonary fibrosis]  General  Interventions   General Interventions Discussed/Reviewed General Interventions Reviewed, Doctor Visits  Doctor Visits Discussed/Reviewed Doctor Visits Reviewed, PCP, Specialist  PCP/Specialist Visits Compliance with follow-up visit       Follow up plan: Follow up call scheduled for 5/16    Encounter Outcome:  Pt. Visit Completed   Kemper Durie, RN, MSN, [facility] Care Management Care Management Coordinator [phone]

## 2023-04-06 DIAGNOSIS — J9611 Chronic respiratory failure with hypoxia: Secondary | ICD-10-CM | POA: Diagnosis not present

## 2023-04-12 DIAGNOSIS — G4733 Obstructive sleep apnea (adult) (pediatric): Secondary | ICD-10-CM | POA: Diagnosis not present

## 2023-04-22 ENCOUNTER — Ambulatory Visit: Payer: Self-pay | Admitting: *Deleted

## 2023-04-22 NOTE — Patient Outreach (Signed)
  Care Coordination   Follow Up Visit Note   04/22/2023 Name: [name]. MRN: [id number] DOB: 1941/07/05  [name]. is a 82 y.o. year old male who sees Lynnea Ferrier, MD for primary care. I spoke with  [name]. by phone today.  What matters to the patients health and wellness today?  Continue health maintenance with minimal shortness of breath.    Goals Addressed             This Visit's Progress    Effective management of pulmonary fibrosis   On track    Care Coordination Interventions: Evaluation of current treatment plan related to pulmonary fibrosis and patient's adherence to plan as established by provider Provided education to patient re: using exercise to increase tolerance and strength as well as a way to expand lungs Reviewed medications with patient and discussed adherence and affordability.   Reviewed scheduled/upcoming provider appointments including Pulmonary on 7/2 and vascular on 7/22  Discussed plans with patient for ongoing care management follow up and provided patient with direct contact information for care management team Discussed use of home O2 and CPAP, state he uses both every night         SDOH assessments and interventions completed:  No     Care Coordination Interventions:  Yes, provided   Interventions Today    Flowsheet Row Most Recent Value  Chronic Disease   Chronic disease during today's visit Chronic Obstructive Pulmonary Disease (COPD), Other  [pulmonary fibrosis]  General Interventions   General Interventions Discussed/Reviewed General Interventions Reviewed, Doctor Visits, Durable Medical Equipment (DME)  Doctor Visits Discussed/Reviewed Doctor Visits Reviewed, PCP, Specialist  [Pulmonary 7/2, Vascular 7/29, PCP 10/7]  Durable Medical Equipment (DME) Other, Oxygen  [Reviewed oxygen and CPAP use]  PCP/Specialist Visits Compliance with follow-up visit  Education Interventions   Education  Provided Provided Education  Provided Verbal Education On When to see the doctor         Follow up plan: Follow up call scheduled for 7/5    Encounter Outcome:  Pt. Visit Completed   Kemper Durie, RN, MSN, [facility] Care Management Care Management Coordinator [phone]

## 2023-06-08 DIAGNOSIS — R0602 Shortness of breath: Secondary | ICD-10-CM | POA: Diagnosis not present

## 2023-06-08 DIAGNOSIS — R911 Solitary pulmonary nodule: Secondary | ICD-10-CM | POA: Diagnosis not present

## 2023-06-11 ENCOUNTER — Ambulatory Visit: Payer: Self-pay | Admitting: *Deleted

## 2023-06-11 NOTE — Patient Outreach (Signed)
  Care Coordination   Follow Up Visit Note   06/11/2023 Name: [name]. MRN: [id number] DOB: [birth year]  [name]. is a 82 y.o. year old male who sees Lynnea Ferrier, MD for primary care. I spoke with  [name]. by phone today.  What matters to the patients health and wellness today?  Ongoing control of lung disease    Goals Addressed             This Visit's Progress    Effective management of pulmonary fibrosis   On track    Care Coordination Interventions: Evaluation of current treatment plan related to pulmonary fibrosis and patient's adherence to plan as established by provider Provided education to patient re: using exercise to increase tolerance and strength as well as a way to expand lungs Reviewed medications with patient and discussed adherence and affordability.   Discussed plans with patient for ongoing care management follow up and provided patient with direct contact information for care management team Discussed use of home O2 and CPAP, state he uses both every night         SDOH assessments and interventions completed:  No     Care Coordination Interventions:  Yes, provided   Interventions Today    Flowsheet Row Most Recent Value  Chronic Disease   Chronic disease during today's visit Other  [pulmonary fibrosis]  General Interventions   General Interventions Discussed/Reviewed General Interventions Reviewed, Doctor Visits  Doctor Visits Discussed/Reviewed Doctor Visits Reviewed, PCP, Specialist  [place] 7/29, cardiology 9/30, PCP 10/7]  PCP/Specialist Visits Compliance with follow-up visit  [last seen by pulmonary on 7/2, repeat CXR done, no change reported]  Education Interventions   Education Provided Provided Education  Provided Verbal Education On Other, Medication  [continues with oxygen, nebs, and inhalers]       Follow up plan: Follow up call scheduled for 10/9    Encounter Outcome:  Pt. Visit  Completed   Kemper Durie, RN, MSN, [facility] Care Management Care Management Coordinator [phone]

## 2023-07-05 ENCOUNTER — Ambulatory Visit (INDEPENDENT_AMBULATORY_CARE_PROVIDER_SITE_OTHER): Payer: PPO | Admitting: Vascular Surgery

## 2023-07-05 ENCOUNTER — Encounter (INDEPENDENT_AMBULATORY_CARE_PROVIDER_SITE_OTHER): Payer: PPO

## 2023-07-07 DIAGNOSIS — J9611 Chronic respiratory failure with hypoxia: Secondary | ICD-10-CM | POA: Diagnosis not present

## 2023-07-22 DIAGNOSIS — J9611 Chronic respiratory failure with hypoxia: Secondary | ICD-10-CM | POA: Diagnosis not present

## 2023-07-28 DIAGNOSIS — G4733 Obstructive sleep apnea (adult) (pediatric): Secondary | ICD-10-CM | POA: Diagnosis not present

## 2023-08-06 DIAGNOSIS — J9611 Chronic respiratory failure with hypoxia: Secondary | ICD-10-CM | POA: Diagnosis not present

## 2023-08-10 NOTE — Progress Notes (Unsigned)
MRN : [id number]  [name]. is a 82 y.o. (Jul 01, 1941) male who presents with chief complaint of check carotid arteries.  History of Present Illness:   The patient returns to the office for followup and review status post angiogram with intervention on 09/01/2022.  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  2.          Popliteal artery aneurysm.  3.          Abdominal aortic aneurysm.    Procedure 01/20/2012:        Percutaneous transluminal angioplasty of the peroneal artery to 3 mm.  2.          Percutaneous transluminal angioplasty of the posterior tibial artery to 3 mm.  3.          Percutaneous transluminal angioplasty of the superficial femoral and vein bypass graft to 8 mm.    The patient notes improvement in the lower extremity symptoms. No interval shortening of the patient's claudication distance or rest pain symptoms. No new ulcers or wounds have occurred since the last visit.   There have been no significant changes to the patient's overall health care.   No documented history of amaurosis fugax or recent TIA symptoms. There are no recent neurological changes noted. No documented history of DVT, PE or superficial thrombophlebitis. The patient denies recent episodes of angina or shortness of breath.    ABI's Rt=1.04 and Lt=1.17  (previous ABI's Rt=1.01 and Lt=1.13)   Duplex US of the left lower extremity arterial system shows uniform velocities without evidence of hemodynamically significant stenosis.  No outpatient medications have been marked as taking for the 08/12/23 encounter (Appointment) with Gilda Crease, Latina Craver, MD.    Past Medical History:  Diagnosis Date   Allergy    Arthritis    Coronary artery disease     COVID-19 11/19/2019   Dysrhythmia    Hypertension    Peripheral vascular disease (HCC)    Pulmonary fibrosis (HCC)    Sleep apnea    CPAP   Tremor     Past Surgical History:  Procedure Laterality Date   CARDIAC SURGERY     CARDIOVERSION N/A 03/03/2018   Procedure: CARDIOVERSION;  Surgeon: Lamar Blinks, MD;  Location: [facility] ORS;  Service: Cardiovascular;  Laterality: N/A;   CARDIOVERSION N/A 05/24/2018   Procedure: CARDIOVERSION;  Surgeon: Lamar Blinks, MD;  Location: [facility] ORS;  Service: Cardiovascular;  Laterality: N/A;   CATARACT EXTRACTION W/PHACO Right 06/03/2020   Procedure: CATARACT EXTRACTION PHACO AND INTRAOCULAR LENS PLACEMENT (IOC) RIGHT 6.07  00:48.0;  Surgeon: Nevada Crane, MD;  Location: [facility];  Service: Ophthalmology;  Laterality: Right;  sleep apnea   CORONARY ARTERY BYPASS GRAFT     EYE SURGERY     LEG SURGERY Left    stents placed, Fem-Fem bypass   LOWER EXTREMITY ANGIOGRAPHY Left 09/01/2022   Procedure: Lower Extremity Angiography;  Surgeon: Renford Dills, MD;  Location: [facility] INVASIVE CV LAB;  Service: Cardiovascular;  Laterality: Left;   RIGHT/LEFT HEART CATH AND CORONARY ANGIOGRAPHY Bilateral 02/12/2022   Procedure: RIGHT/LEFT HEART CATH AND  CORONARY ANGIOGRAPHY;  Surgeon: Lamar Blinks, MD;  Location: [facility] INVASIVE CV LAB;  Service: Cardiovascular;  Laterality: Bilateral;   TONSILLECTOMY      Social History Social History   Tobacco Use   Smoking status: Former    Current packs/day: 0.00    Types: Cigarettes    Quit date: 02/16/1979    Years since quitting: 44.5   Smokeless tobacco: Never  Vaping Use   Vaping status: Never Used  Substance Use Topics   Alcohol use: Yes    Comment: rarely   Drug use: No    Family History Family History  Problem Relation Age of Onset   Thyroid disease Mother    Macular degeneration Mother    Cancer Father     Allergies  Allergen Reactions   Fish Allergy Hives   Hydrocodone      Other reaction(s): Other (See Comments) confusion   Tramadol Other (See Comments)    confusion   Hydrochlorothiazide Other (See Comments)    worsens gout   Iodine Rash   Shellfish Allergy Rash     REVIEW OF SYSTEMS (Negative unless checked)  Constitutional: [] Weight loss  [] Fever  [] Chills Cardiac: [] Chest pain   [] Chest pressure   [] Palpitations   [] Shortness of breath when laying flat   [] Shortness of breath with exertion. Vascular:  [x] Pain in legs with walking   [] Pain in legs at rest  [] History of DVT   [] Phlebitis   [] Swelling in legs   [] Varicose veins   [] Non-healing ulcers Pulmonary:   [] Uses home oxygen   [] Productive cough   [] Hemoptysis   [] Wheeze  [] COPD   [] Asthma Neurologic:  [] Dizziness   [] Seizures   [] History of stroke   [] History of TIA  [] Aphasia   [] Vissual changes   [] Weakness or numbness in arm   [] Weakness or numbness in leg Musculoskeletal:   [] Joint swelling   [] Joint pain   [] Low back pain Hematologic:  [] Easy bruising  [] Easy bleeding   [] Hypercoagulable state   [] Anemic Gastrointestinal:  [] Diarrhea   [] Vomiting  [] Gastroesophageal reflux/heartburn   [] Difficulty swallowing. Genitourinary:  [] Chronic kidney disease   [] Difficult urination  [] Frequent urination   [] Blood in urine Skin:  [] Rashes   [] Ulcers  Psychological:  [] History of anxiety   []  History of major depression.  Physical Examination  There were no vitals filed for this visit. There is no height or weight on file to calculate BMI. Gen: WD/WN, NAD Head: [place]/AT, No temporalis wasting.  Ear/Nose/Throat: Hearing grossly intact, nares w/o erythema or drainage Eyes: PER, EOMI, sclera nonicteric.  Neck: Supple, no masses.  No bruit or JVD.  Pulmonary:  Good air movement, no audible wheezing, no use of accessory muscles.  Cardiac: RRR, normal S1, S2, no Murmurs. Vascular:  carotid bruit noted Vessel Right Left  Radial Palpable Palpable  Carotid  Palpable  Palpable  Subclav  Palpable  Palpable  Gastrointestinal: soft, non-distended. No guarding/no peritoneal signs.  Musculoskeletal: M/S 5/5 throughout.  No visible deformity.  Neurologic: CN 2-12 intact. Pain and light touch intact in extremities.  Symmetrical.  Speech is fluent. Motor exam as listed above. Psychiatric: Judgment intact, Mood & affect appropriate for pt's clinical situation. Dermatologic: No rashes or ulcers noted.  No changes consistent with cellulitis.   CBC Lab Results  Component Value Date   WBC 8.2 02/16/2022   HGB 9.3 (L) 02/16/2022   HCT 28.8 (L) 02/16/2022   MCV 91.7 02/16/2022   PLT 149 (L) 02/16/2022  BMET    Component Value Date/Time   NA 140 02/16/2022 0311   NA 138 01/28/2012 0550   K 3.7 02/16/2022 0311   K 4.4 01/28/2012 0550   CL 106 02/16/2022 0311   CL 103 01/28/2012 0550   CO2 27 02/16/2022 0311   CO2 26 01/28/2012 0550   GLUCOSE 114 (H) 02/16/2022 0311   GLUCOSE 110 (H) 01/28/2012 0550   BUN 19 09/01/2022 0725   BUN 19 (H) 01/28/2012 0550   CREATININE 0.93 09/01/2022 0725   CREATININE 0.85 01/28/2012 0550   CALCIUM 8.3 (L) 02/16/2022 0311   CALCIUM 8.7 01/28/2012 0550   GFRNONAA >60 09/01/2022 0725   GFRNONAA >60 01/28/2012 0550   GFRAA >60 01/28/2012 0550   CrCl cannot be calculated (Patient's most recent lab result is older than the maximum 21 days allowed.).  COAG Lab Results  Component Value Date   INR 1.4 (H) 02/15/2022   INR 1.45 09/09/2015   INR 1.9 01/29/2012    Radiology No results found.   Assessment/Plan There are no diagnoses linked to this encounter.   Levora Dredge, MD  08/10/2023 11:54 AM

## 2023-08-11 ENCOUNTER — Other Ambulatory Visit (INDEPENDENT_AMBULATORY_CARE_PROVIDER_SITE_OTHER): Payer: Self-pay | Admitting: Vascular Surgery

## 2023-08-11 DIAGNOSIS — I724 Aneurysm of artery of lower extremity: Secondary | ICD-10-CM

## 2023-08-11 DIAGNOSIS — I6523 Occlusion and stenosis of bilateral carotid arteries: Secondary | ICD-10-CM

## 2023-08-11 DIAGNOSIS — I70213 Atherosclerosis of native arteries of extremities with intermittent claudication, bilateral legs: Secondary | ICD-10-CM

## 2023-08-11 DIAGNOSIS — I7143 Infrarenal abdominal aortic aneurysm, without rupture: Secondary | ICD-10-CM

## 2023-08-12 ENCOUNTER — Ambulatory Visit (INDEPENDENT_AMBULATORY_CARE_PROVIDER_SITE_OTHER): Payer: PPO

## 2023-08-12 ENCOUNTER — Encounter (INDEPENDENT_AMBULATORY_CARE_PROVIDER_SITE_OTHER): Payer: Self-pay | Admitting: Vascular Surgery

## 2023-08-12 ENCOUNTER — Ambulatory Visit (INDEPENDENT_AMBULATORY_CARE_PROVIDER_SITE_OTHER): Payer: PPO | Admitting: Vascular Surgery

## 2023-08-12 VITALS — BP 158/84 | HR 60 | Resp 16 | Wt 247.6 lb

## 2023-08-12 DIAGNOSIS — I6523 Occlusion and stenosis of bilateral carotid arteries: Secondary | ICD-10-CM

## 2023-08-12 DIAGNOSIS — I724 Aneurysm of artery of lower extremity: Secondary | ICD-10-CM | POA: Diagnosis not present

## 2023-08-12 DIAGNOSIS — I70213 Atherosclerosis of native arteries of extremities with intermittent claudication, bilateral legs: Secondary | ICD-10-CM

## 2023-08-12 DIAGNOSIS — I7143 Infrarenal abdominal aortic aneurysm, without rupture: Secondary | ICD-10-CM | POA: Diagnosis not present

## 2023-08-12 DIAGNOSIS — I10 Essential (primary) hypertension: Secondary | ICD-10-CM | POA: Diagnosis not present

## 2023-08-12 DIAGNOSIS — I25118 Atherosclerotic heart disease of native coronary artery with other forms of angina pectoris: Secondary | ICD-10-CM | POA: Diagnosis not present

## 2023-08-22 DIAGNOSIS — J9611 Chronic respiratory failure with hypoxia: Secondary | ICD-10-CM | POA: Diagnosis not present

## 2023-08-27 DIAGNOSIS — J449 Chronic obstructive pulmonary disease, unspecified: Secondary | ICD-10-CM | POA: Diagnosis not present

## 2023-08-27 DIAGNOSIS — R0602 Shortness of breath: Secondary | ICD-10-CM | POA: Diagnosis not present

## 2023-08-27 DIAGNOSIS — E669 Obesity, unspecified: Secondary | ICD-10-CM | POA: Diagnosis not present

## 2023-08-28 DIAGNOSIS — G4733 Obstructive sleep apnea (adult) (pediatric): Secondary | ICD-10-CM | POA: Diagnosis not present

## 2023-08-31 DIAGNOSIS — H43811 Vitreous degeneration, right eye: Secondary | ICD-10-CM | POA: Diagnosis not present

## 2023-09-07 DIAGNOSIS — E782 Mixed hyperlipidemia: Secondary | ICD-10-CM | POA: Diagnosis not present

## 2023-09-07 DIAGNOSIS — R7303 Prediabetes: Secondary | ICD-10-CM | POA: Diagnosis not present

## 2023-09-07 DIAGNOSIS — M1A9XX0 Chronic gout, unspecified, without tophus (tophi): Secondary | ICD-10-CM | POA: Diagnosis not present

## 2023-09-07 DIAGNOSIS — I77810 Thoracic aortic ectasia: Secondary | ICD-10-CM | POA: Diagnosis not present

## 2023-09-13 DIAGNOSIS — J449 Chronic obstructive pulmonary disease, unspecified: Secondary | ICD-10-CM | POA: Diagnosis not present

## 2023-09-13 DIAGNOSIS — I724 Aneurysm of artery of lower extremity: Secondary | ICD-10-CM | POA: Diagnosis not present

## 2023-09-13 DIAGNOSIS — I4819 Other persistent atrial fibrillation: Secondary | ICD-10-CM | POA: Diagnosis not present

## 2023-09-13 DIAGNOSIS — I25810 Atherosclerosis of coronary artery bypass graft(s) without angina pectoris: Secondary | ICD-10-CM | POA: Diagnosis not present

## 2023-09-13 DIAGNOSIS — J8410 Pulmonary fibrosis, unspecified: Secondary | ICD-10-CM | POA: Diagnosis not present

## 2023-09-13 DIAGNOSIS — Z0000 Encounter for general adult medical examination without abnormal findings: Secondary | ICD-10-CM | POA: Diagnosis not present

## 2023-09-13 DIAGNOSIS — J9611 Chronic respiratory failure with hypoxia: Secondary | ICD-10-CM | POA: Diagnosis not present

## 2023-09-13 DIAGNOSIS — I7143 Infrarenal abdominal aortic aneurysm, without rupture: Secondary | ICD-10-CM | POA: Diagnosis not present

## 2023-09-13 DIAGNOSIS — D6869 Other thrombophilia: Secondary | ICD-10-CM | POA: Diagnosis not present

## 2023-09-13 DIAGNOSIS — I77810 Thoracic aortic ectasia: Secondary | ICD-10-CM | POA: Diagnosis not present

## 2023-09-13 DIAGNOSIS — I10 Essential (primary) hypertension: Secondary | ICD-10-CM | POA: Diagnosis not present

## 2023-09-13 DIAGNOSIS — E118 Type 2 diabetes mellitus with unspecified complications: Secondary | ICD-10-CM | POA: Diagnosis present

## 2023-09-13 DIAGNOSIS — I739 Peripheral vascular disease, unspecified: Secondary | ICD-10-CM | POA: Diagnosis not present

## 2023-09-14 DIAGNOSIS — I4821 Permanent atrial fibrillation: Secondary | ICD-10-CM | POA: Diagnosis not present

## 2023-09-14 DIAGNOSIS — E782 Mixed hyperlipidemia: Secondary | ICD-10-CM | POA: Diagnosis not present

## 2023-09-14 DIAGNOSIS — I10 Essential (primary) hypertension: Secondary | ICD-10-CM | POA: Diagnosis not present

## 2023-09-14 DIAGNOSIS — I340 Nonrheumatic mitral (valve) insufficiency: Secondary | ICD-10-CM | POA: Diagnosis not present

## 2023-09-14 DIAGNOSIS — I739 Peripheral vascular disease, unspecified: Secondary | ICD-10-CM | POA: Diagnosis not present

## 2023-09-14 DIAGNOSIS — Z23 Encounter for immunization: Secondary | ICD-10-CM | POA: Diagnosis not present

## 2023-09-14 DIAGNOSIS — I25810 Atherosclerosis of coronary artery bypass graft(s) without angina pectoris: Secondary | ICD-10-CM | POA: Diagnosis not present

## 2023-09-15 ENCOUNTER — Ambulatory Visit: Payer: Self-pay | Admitting: *Deleted

## 2023-09-15 NOTE — Patient Outreach (Signed)
  Care Coordination   Follow Up Visit Note   09/15/2023 Name: [name]. MRN: [id number] DOB: 14-Mar-1941  [name]. is a 82 y.o. year old male who sees Lynnea Ferrier, MD for primary care. I spoke with  [name]. by phone today.  What matters to the patients health and wellness today?  Patient report he is doing well, denies any current shortness of breath.  Working on paperwork for medication assistance.    Goals Addressed             This Visit's Progress    Effective management of pulmonary fibrosis   On track    Care Coordination Interventions: Evaluation of current treatment plan related to pulmonary fibrosis and patient's adherence to plan as established by provider Provided education to patient re: using exercise to increase tolerance and strength as well as a way to expand lungs Reviewed medications with patient and discussed adherence and affordability.   Discussed plans with patient for ongoing care management follow up and provided patient with direct contact information for care management team Discussed use of home O2 and CPAP         SDOH assessments and interventions completed:  No     Care Coordination Interventions:  Yes, provided   Interventions Today    Flowsheet Row Most Recent Value  Chronic Disease   Chronic disease during today's visit Other  [Pulmonary Fibrosis]  General Interventions   General Interventions Discussed/Reviewed General Interventions Reviewed, Doctor Visits, Vaccines  Vaccines Flu  Doctor Visits Discussed/Reviewed Doctor Visits Reviewed, PCP, Specialist  [follow up visits scheduled for next year (April)]  PCP/Specialist Visits Compliance with follow-up visit  [PCP completed on 10/7, cardiology completed on 10/8, and pulmonary completed on 9/20]  Education Interventions   Education Provided Provided Education  Provided Verbal Education On Medication, When to see the doctor  [Was placed on new  inhaler, Ohtuvayre, concerned about the price]  Pharmacy Interventions   Pharmacy Dicussed/Reviewed Pharmacy Topics Reviewed, Affording Medications  [Working with MD office for medication assistance for new inhaler]       Follow up plan: Follow up call scheduled for 12/16    Encounter Outcome:  Patient Visit Completed   Kemper Durie, RN, MSN, [facility] Care Management Care Management Coordinator [phone]

## 2023-09-21 DIAGNOSIS — J9611 Chronic respiratory failure with hypoxia: Secondary | ICD-10-CM | POA: Diagnosis not present

## 2023-09-27 DIAGNOSIS — G4733 Obstructive sleep apnea (adult) (pediatric): Secondary | ICD-10-CM | POA: Diagnosis not present

## 2023-10-15 DIAGNOSIS — I517 Cardiomegaly: Secondary | ICD-10-CM | POA: Diagnosis not present

## 2023-10-15 DIAGNOSIS — R0602 Shortness of breath: Secondary | ICD-10-CM | POA: Diagnosis not present

## 2023-10-15 DIAGNOSIS — J84112 Idiopathic pulmonary fibrosis: Secondary | ICD-10-CM | POA: Diagnosis not present

## 2023-10-15 DIAGNOSIS — R918 Other nonspecific abnormal finding of lung field: Secondary | ICD-10-CM | POA: Diagnosis not present

## 2023-10-22 DIAGNOSIS — J9611 Chronic respiratory failure with hypoxia: Secondary | ICD-10-CM | POA: Diagnosis not present

## 2023-10-28 DIAGNOSIS — G4733 Obstructive sleep apnea (adult) (pediatric): Secondary | ICD-10-CM | POA: Diagnosis not present

## 2023-11-08 ENCOUNTER — Encounter: Payer: PPO | Admitting: *Deleted

## 2023-11-09 ENCOUNTER — Ambulatory Visit: Payer: PPO

## 2023-11-10 DIAGNOSIS — R0602 Shortness of breath: Secondary | ICD-10-CM | POA: Diagnosis not present

## 2023-11-15 ENCOUNTER — Telehealth (INDEPENDENT_AMBULATORY_CARE_PROVIDER_SITE_OTHER): Payer: Self-pay

## 2023-11-15 NOTE — Telephone Encounter (Signed)
Given his sx of popliteal artery aneurysm, he should have a left arterial duplex and see me or GS

## 2023-11-15 NOTE — Telephone Encounter (Signed)
Patient left a message stating that he been having left leg redness from the knee down and left foot swelling for a week. Patient is requesting to seen in the office. I did check to see if he has reach out to his PCP but he has not at this time. Please Advise

## 2023-11-16 ENCOUNTER — Ambulatory Visit (INDEPENDENT_AMBULATORY_CARE_PROVIDER_SITE_OTHER): Payer: PPO

## 2023-11-16 ENCOUNTER — Other Ambulatory Visit (INDEPENDENT_AMBULATORY_CARE_PROVIDER_SITE_OTHER): Payer: Self-pay | Admitting: Nurse Practitioner

## 2023-11-16 DIAGNOSIS — M7989 Other specified soft tissue disorders: Secondary | ICD-10-CM

## 2023-11-16 DIAGNOSIS — R238 Other skin changes: Secondary | ICD-10-CM | POA: Diagnosis not present

## 2023-11-17 DIAGNOSIS — J849 Interstitial pulmonary disease, unspecified: Secondary | ICD-10-CM | POA: Diagnosis not present

## 2023-11-21 DIAGNOSIS — J9611 Chronic respiratory failure with hypoxia: Secondary | ICD-10-CM | POA: Diagnosis not present

## 2023-11-22 ENCOUNTER — Ambulatory Visit: Payer: Self-pay | Admitting: *Deleted

## 2023-11-22 DIAGNOSIS — R0602 Shortness of breath: Secondary | ICD-10-CM | POA: Diagnosis not present

## 2023-11-22 DIAGNOSIS — J984 Other disorders of lung: Secondary | ICD-10-CM | POA: Diagnosis not present

## 2023-11-22 DIAGNOSIS — J9611 Chronic respiratory failure with hypoxia: Secondary | ICD-10-CM | POA: Diagnosis not present

## 2023-11-22 DIAGNOSIS — R918 Other nonspecific abnormal finding of lung field: Secondary | ICD-10-CM | POA: Diagnosis not present

## 2023-11-22 DIAGNOSIS — J849 Interstitial pulmonary disease, unspecified: Secondary | ICD-10-CM | POA: Diagnosis not present

## 2023-11-22 NOTE — Patient Outreach (Signed)
  Care Coordination   Follow Up Visit Note   11/22/2023 Name: [name]. MRN: [id number] DOB: 1941-07-26  [name]. is a 82 y.o. year old male who sees Lynnea Ferrier, MD for primary care. I spoke with  [name]. by phone today.  What matters to the patients health and wellness today?  Has been having increased shortness of breath for several weeks, completed course of antibiotic and steroids, called office today for an acute visit this afternoon.     Goals Addressed             This Visit's Progress    Effective management of pulmonary fibrosis   On track    Care Coordination Interventions: Evaluation of current treatment plan related to pulmonary fibrosis and patient's adherence to plan as established by provider Provided education to patient re: using exercise to increase tolerance and strength as well as a way to expand lungs Reviewed medications with patient and discussed adherence and affordability.   Discussed plans with patient for ongoing care management follow up and provided patient with direct contact information for care management team         SDOH assessments and interventions completed:  No     Care Coordination Interventions:  Yes, provided   Interventions Today    Flowsheet Row Most Recent Value  Chronic Disease   Chronic disease during today's visit Chronic Obstructive Pulmonary Disease (COPD), Other  [pulmonary fibrosis]  General Interventions   General Interventions Discussed/Reviewed General Interventions Reviewed, Doctor Visits, Durable Medical Equipment (DME)  [name] some increased shortness of breath today, he has already called pulmonary office for follow up later this afternoon]  Doctor Visits Discussed/Reviewed Doctor Visits Reviewed, PCP, Specialist  Durable Medical Equipment (DME) Oxygen  [confirms he is still using his home O2]  PCP/Specialist Visits Compliance with follow-up visit  Education  Interventions   Education Provided Provided Education  Provided Verbal Education On When to see the doctor, Medication, Sick Day Rules  [State he was started on new nebulizer (unable to remember the name) but does not feel it is working.  Also changed for Furosemide to Torsemide,.]       Follow up plan: Follow up call scheduled for 1/8    Encounter Outcome:  Patient Visit Completed   Rodney Langton, RN, MSN, CCM [place]  [facility], [facility] RN Care Coordinator Direct Dial: [phone] / Main [phone] Fax [phone] Email: [email or web address]

## 2023-11-23 DIAGNOSIS — I340 Nonrheumatic mitral (valve) insufficiency: Secondary | ICD-10-CM | POA: Diagnosis not present

## 2023-11-23 DIAGNOSIS — I4821 Permanent atrial fibrillation: Secondary | ICD-10-CM | POA: Diagnosis not present

## 2023-11-23 DIAGNOSIS — I25810 Atherosclerosis of coronary artery bypass graft(s) without angina pectoris: Secondary | ICD-10-CM | POA: Diagnosis not present

## 2023-11-27 DIAGNOSIS — G4733 Obstructive sleep apnea (adult) (pediatric): Secondary | ICD-10-CM | POA: Diagnosis not present

## 2023-12-07 DIAGNOSIS — J84111 Idiopathic interstitial pneumonia, not otherwise specified: Secondary | ICD-10-CM | POA: Diagnosis not present

## 2023-12-08 DIAGNOSIS — G4733 Obstructive sleep apnea (adult) (pediatric): Secondary | ICD-10-CM | POA: Diagnosis not present

## 2023-12-08 DIAGNOSIS — J9611 Chronic respiratory failure with hypoxia: Secondary | ICD-10-CM | POA: Diagnosis not present

## 2023-12-15 ENCOUNTER — Ambulatory Visit: Payer: Self-pay | Admitting: *Deleted

## 2023-12-15 NOTE — Patient Instructions (Signed)
 Visit Information  Thank you for taking time to visit with me today. Please don't hesitate to contact me if I can be of assistance to you before our next scheduled telephone appointment.  Our next appointment is by telephone on 2/6  Please call the care guide team at [phone] if you need to cancel or reschedule your appointment.   Please call the Suicide and Crisis Lifeline: 988 call the [place]  National Suicide Prevention Lifeline: 269-489-2447 or TTY: 817-107-3434 TTY (972) 015-0660) to talk to a trained counselor call 1-800-273-TALK (toll free, 24 hour hotline) call 911 if you are experiencing a Mental Health or Behavioral Health Crisis or need someone to talk to.  The patient verbalized understanding of instructions, educational materials, and care plan provided today and agreed to receive a mailed copy of patient instructions, educational materials, and care plan.   The patient has been provided with contact information for the care management team and has been advised to call with any health related questions or concerns.   Odella Ku, RN, MSN, CCM [facility] RN Care Coordinator Direct Dial: [phone] / Main [phone] Fax [phone] Email: [email or web address]

## 2023-12-15 NOTE — Patient Outreach (Signed)
  Care Coordination   Follow Up Visit Note   12/15/2023 Name: [name]. MRN: [id number] DOB: 05/22/1941  [name]. is a 83 y.o. year old male who sees Fernande Ophelia JINNY DOUGLAS, MD for primary care. I spoke with  [name]. by phone today.  What matters to the patients health and wellness today?  Breathing is improved with new medication regime, working to keep improving.  Denies any urgent concerns, encouraged to contact this care manager with questions.      Goals Addressed             This Visit's Progress    Effective management of pulmonary fibrosis   On track    Care Coordination Interventions: Evaluation of current treatment plan related to pulmonary fibrosis and patient's adherence to plan as established by provider Provided education to patient re: using exercise to increase tolerance and strength as well as a way to expand lungs Reviewed medications with patient and discussed adherence and affordability.   Discussed plans with patient for ongoing care management follow up and provided patient with direct contact information for care management team         SDOH assessments and interventions completed:  No     Care Coordination Interventions:  Yes, provided   Follow up plan: Follow up call scheduled for 2/6    Encounter Outcome:  Patient Visit Completed   Odella Ku, RN, MSN, CCM [place]  [facility], [facility] RN Care Coordinator Direct Dial: [phone] / Main [phone] Fax [phone] Email: [email or web address]

## 2023-12-28 DIAGNOSIS — G4733 Obstructive sleep apnea (adult) (pediatric): Secondary | ICD-10-CM | POA: Diagnosis not present

## 2024-01-03 DIAGNOSIS — J849 Interstitial pulmonary disease, unspecified: Secondary | ICD-10-CM | POA: Diagnosis not present

## 2024-01-13 ENCOUNTER — Ambulatory Visit: Payer: Self-pay | Admitting: *Deleted

## 2024-01-13 NOTE — Patient Outreach (Signed)
  Care Coordination   Follow Up Visit Note   01/13/2024 Name: Keidan Aumiller. MRN: [id number] DOB: 29-Apr-1941  Essie KATHEE Franceen Mickey. is a 83 y.o. year old male who sees Fernande Ophelia JINNY DOUGLAS, MD for primary care. I spoke with  Essie KATHEE Franceen Mickey. by phone today.  What matters to the patients health and wellness today?  Patient report he is doing ok, daughter passed away a couple weeks ago.  State he is currently driving, request call back within the hour.  Called back, no answer, voice message left.      SDOH assessments and interventions completed:  No     Care Coordination Interventions:  No, not indicated   Follow up plan:  Will have care guide reschedule    Encounter Outcome:  Patient Visit Completed   Odella Ku, RN, MSN, CCM [place]  [facility], [facility] RN Care Coordinator Direct Dial: [phone] / Main [phone] Fax [phone] Email: [email or web address] Website: [email or web address]

## 2024-01-19 DIAGNOSIS — R6883 Chills (without fever): Secondary | ICD-10-CM | POA: Diagnosis not present

## 2024-01-22 DIAGNOSIS — J9611 Chronic respiratory failure with hypoxia: Secondary | ICD-10-CM | POA: Diagnosis not present

## 2024-01-28 DIAGNOSIS — G4733 Obstructive sleep apnea (adult) (pediatric): Secondary | ICD-10-CM | POA: Diagnosis not present

## 2024-02-02 ENCOUNTER — Ambulatory Visit: Payer: Self-pay | Admitting: *Deleted

## 2024-02-02 NOTE — Patient Outreach (Signed)
 Care Coordination   Follow Up Visit Note   02/02/2024 Name: [name]. MRN: [id number] DOB: [birth year]  [name]. is a 83 y.o. year old male who sees Lynnea Ferrier, MD for primary care. I spoke with  [name]. by phone today.  What matters to the patients health and wellness today?  Report he is "doing the best I can" especially since the loss of his daughter.  Currently at her home going through things.  Medically, state he is better.  Denies any urgent concerns, encouraged to contact this care manager with questions.    Goals Addressed             This Visit's Progress    Effective management of pulmonary fibrosis   On track    Care Coordination Interventions: Evaluation of current treatment plan related to pulmonary fibrosis and patient's adherence to plan as established by provider Provided education to patient re: using exercise to increase tolerance and strength as well as a way to expand lungs Reviewed medications with patient and discussed adherence and affordability.   Discussed plans with patient for ongoing care management follow up and provided patient with direct contact information for care management team         SDOH assessments and interventions completed:  No     Care Coordination Interventions:  Yes, provided   Interventions Today    Flowsheet Row Most Recent Value  Chronic Disease   Chronic disease during today's visit Chronic Obstructive Pulmonary Disease (COPD), Other  [pulmonary fibrosis]  General Interventions   General Interventions Discussed/Reviewed General Interventions Reviewed, Doctor Visits, Durable Medical Equipment (DME)  [Discussed pulmonary rehab, still not ready to pursue due to location of office]  Doctor Visits Discussed/Reviewed Doctor Visits Reviewed, PCP, Specialist  [upcoming with pulmonology on 3/11, PCP on 4/7]  Durable Medical Equipment (DME) Oxygen, Other  [using oxygen at night and during  the day as needed, has oximeter to monitor saturations, report range low 80s with activity and up to 92% with rest]  PCP/Specialist Visits Compliance with follow-up visit  Education Interventions   Education Provided Provided Education  Provided Verbal Education On Medication, When to see the doctor, Sick Day Rules  [medications reviewed, taking another course of prednisone, state feeling much better with it. Aware of COPD zones and when to notify provider if feeling unwell]  Mental Health Interventions   Mental Health Discussed/Reviewed Mental Health Reviewed, Grief and Loss  [Discussed loss of daughter, state it is hard but dealing with it with the help of family]        Follow up plan: Follow up call scheduled for 4/11    Encounter Outcome:  Patient Visit Completed   Rodney Langton, RN, MSN, CCM [place]  [facility], [facility] RN Care Coordinator Direct Dial: [phone] / Main [phone] Fax [phone] Email: [email or web address] Website: [email or web address]

## 2024-02-15 DIAGNOSIS — Z7952 Long term (current) use of systemic steroids: Secondary | ICD-10-CM | POA: Diagnosis not present

## 2024-02-15 DIAGNOSIS — J9611 Chronic respiratory failure with hypoxia: Secondary | ICD-10-CM | POA: Diagnosis not present

## 2024-02-15 DIAGNOSIS — R5381 Other malaise: Secondary | ICD-10-CM | POA: Diagnosis not present

## 2024-02-15 DIAGNOSIS — J849 Interstitial pulmonary disease, unspecified: Secondary | ICD-10-CM | POA: Diagnosis not present

## 2024-02-15 DIAGNOSIS — J4540 Moderate persistent asthma, uncomplicated: Secondary | ICD-10-CM | POA: Diagnosis not present

## 2024-02-19 DIAGNOSIS — J9611 Chronic respiratory failure with hypoxia: Secondary | ICD-10-CM | POA: Diagnosis not present

## 2024-02-25 DIAGNOSIS — G4733 Obstructive sleep apnea (adult) (pediatric): Secondary | ICD-10-CM | POA: Diagnosis not present

## 2024-03-06 DIAGNOSIS — D692 Other nonthrombocytopenic purpura: Secondary | ICD-10-CM | POA: Diagnosis not present

## 2024-03-06 DIAGNOSIS — E782 Mixed hyperlipidemia: Secondary | ICD-10-CM | POA: Diagnosis not present

## 2024-03-06 DIAGNOSIS — E118 Type 2 diabetes mellitus with unspecified complications: Secondary | ICD-10-CM | POA: Diagnosis not present

## 2024-03-06 DIAGNOSIS — I4819 Other persistent atrial fibrillation: Secondary | ICD-10-CM | POA: Diagnosis not present

## 2024-03-14 DIAGNOSIS — I4821 Permanent atrial fibrillation: Secondary | ICD-10-CM | POA: Diagnosis not present

## 2024-03-14 DIAGNOSIS — E782 Mixed hyperlipidemia: Secondary | ICD-10-CM | POA: Diagnosis not present

## 2024-03-14 DIAGNOSIS — I25810 Atherosclerosis of coronary artery bypass graft(s) without angina pectoris: Secondary | ICD-10-CM | POA: Diagnosis not present

## 2024-03-14 DIAGNOSIS — I10 Essential (primary) hypertension: Secondary | ICD-10-CM | POA: Diagnosis not present

## 2024-03-14 DIAGNOSIS — Z951 Presence of aortocoronary bypass graft: Secondary | ICD-10-CM | POA: Diagnosis not present

## 2024-03-14 DIAGNOSIS — I340 Nonrheumatic mitral (valve) insufficiency: Secondary | ICD-10-CM | POA: Diagnosis not present

## 2024-03-14 DIAGNOSIS — I272 Pulmonary hypertension, unspecified: Secondary | ICD-10-CM | POA: Diagnosis not present

## 2024-03-15 ENCOUNTER — Ambulatory Visit: Payer: Self-pay | Admitting: *Deleted

## 2024-03-15 NOTE — Patient Outreach (Addendum)
 Complex Care Management   Visit Note  03/15/2024  Name:  [name]. MRN: [id number] DOB: 02-08-1941  Situation: Referral received for Complex Care Management related to  pulmonary fibrosis  I obtained verbal consent from patient.  Visit completed with patient  on the phone  Background:   Past Medical History:  Diagnosis Date   Allergy    Arthritis    Coronary artery disease    COVID-19 11/19/2019   Dysrhythmia    Hypertension    Peripheral vascular disease (HCC)    Pulmonary fibrosis (HCC)    Sleep apnea    CPAP   Tremor     Assessment:  Continues to grieve the loss of his daughter, otherwise chronic conditions are well managed.  Denies any urgent concerns, encouraged to contact this care manager with questions.   Patient Reported Symptoms:  Cognitive Alert and oriented to person, place, and time  Neurological No symptoms reported    HEENT No symptoms reported    Cardiovascular No symptoms reported    Respiratory No symptoms reported    Endocrine No symptoms reported    Gastrointestinal No symptoms reported    Genitourinary No symptoms reported    Integumentary No symptoms reported    Musculoskeletal No symptoms reported    Psychosocial No symptoms reported     There were no vitals filed for this visit.  Medications Reviewed Today     Reviewed by Rodney Langton, RN (Registered Nurse) on 03/15/24 at 1550  Med List Status: <None>   Medication Order Taking? Sig Documenting Provider Last Dose Status Informant  acetaminophen (TYLENOL) 650 MG CR tablet [id number] Yes Take 1,300 mg by mouth every 8 (eight) hours as needed for pain.  [provider] Taking Active Self  ALPRAZolam (XANAX) 0.25 MG tablet [id number] Yes Take 0.25 mg by mouth 3 (three) times daily as needed for anxiety. [provider] Taking Active Self  atorvastatin (LIPITOR) 40 MG tablet [id number] Yes Take 40 mg by mouth daily. [provider] Taking Active Self   diltiazem (CARDIZEM CD) 120 MG 24 hr capsule [id number] Yes Take 120 mg by mouth 2 (two) times daily. [provider] Taking Active Self  ELIQUIS 5 MG TABS tablet [id number] Yes Take 1 tablet by mouth in the morning and at bedtime. [provider] Taking Active   escitalopram (LEXAPRO) 5 MG tablet [id number] Yes Take 5 mg by mouth daily. [provider] Taking Active   furosemide (LASIX) 40 MG tablet [id number] Yes Take 40 mg by mouth daily. [provider] Taking Active Self  levothyroxine (SYNTHROID) 25 MCG tablet [id number] Yes Take 25 mcg by mouth every morning. [provider] Taking Active Self  metoprolol succinate (TOPROL-XL) 50 MG 24 hr tablet [id number] Yes Take 50 mg by mouth daily. [provider] Taking Active Self  Multiple Vitamin (MULTIVITAMIN) tablet [id number] Yes Take 1 tablet by mouth daily. Centrum for men [provider] Taking Active Self  spironolactone (ALDACTONE) 25 MG tablet [id number] Yes Take 12.5 mg by mouth daily. [provider] Taking Active   telmisartan (MICARDIS) 40 MG tablet [id number] Yes Take 40 mg by mouth daily. [provider] Taking Active Self  torsemide (DEMADEX) 10 MG tablet [id number] No Take 10 mg by mouth daily.  Patient not taking: Reported on 03/15/2024   [provider] Not Taking Active   Treprostinil (TYVASO DPI MAINTENANCE KIT) 16 MCG POWD [id number] No Inhale 16 % into the lungs 4 (four) times daily.  Patient  not taking: Reported on 08/12/2023   [provider] Not Taking Active             Recommendation:   PCP Follow-up Specialty provider follow-up 6/10, PCP 4/15  Follow Up Plan:   Patient has met all care management goals. Care Management case will be closed. Patient has been provided contact information should new needs arise.   Rodney Langton, RN, MSN, CCM [facility], [facility] RN Care  Coordinator Direct Dial: [phone] / Main [phone] Fax [phone] Email: [email or web address]: [email or web address]

## 2024-03-15 NOTE — Patient Instructions (Signed)
 Visit Information  Thank you for taking time to visit with me today. Please don't hesitate to contact me if I can be of assistance to you before our next scheduled appointment.  Patient has met all care management goals. Care Management case will be closed. Patient has been provided contact information should new needs arise.   Please call the care guide team at [phone] if you need to cancel, schedule, or reschedule an appointment.   Please call the Suicide and Crisis Lifeline: 988 call the [place] National Suicide Prevention Lifeline: 639-019-6409 or TTY: 9727055712 TTY (463) 293-6376) to talk to a trained counselor call 1-800-273-TALK (toll free, 24 hour hotline) call 911 if you are experiencing a Mental Health or Behavioral Health Crisis or need someone to talk to.  Rodney Langton, RN, MSN, CCM [facility], [facility] RN Care Coordinator Direct Dial: [phone] / Main [phone] Fax [phone] Email: [email or web address]: [email or web address]

## 2024-03-16 ENCOUNTER — Encounter: Payer: Self-pay | Admitting: *Deleted

## 2024-03-17 DIAGNOSIS — J4540 Moderate persistent asthma, uncomplicated: Secondary | ICD-10-CM | POA: Diagnosis not present

## 2024-03-21 DIAGNOSIS — I724 Aneurysm of artery of lower extremity: Secondary | ICD-10-CM | POA: Diagnosis not present

## 2024-03-21 DIAGNOSIS — I7143 Infrarenal abdominal aortic aneurysm, without rupture: Secondary | ICD-10-CM | POA: Diagnosis not present

## 2024-03-21 DIAGNOSIS — I739 Peripheral vascular disease, unspecified: Secondary | ICD-10-CM | POA: Diagnosis not present

## 2024-03-21 DIAGNOSIS — J9611 Chronic respiratory failure with hypoxia: Secondary | ICD-10-CM | POA: Diagnosis not present

## 2024-03-21 DIAGNOSIS — I77810 Thoracic aortic ectasia: Secondary | ICD-10-CM | POA: Diagnosis not present

## 2024-03-21 DIAGNOSIS — E118 Type 2 diabetes mellitus with unspecified complications: Secondary | ICD-10-CM | POA: Diagnosis not present

## 2024-03-21 DIAGNOSIS — J449 Chronic obstructive pulmonary disease, unspecified: Secondary | ICD-10-CM | POA: Diagnosis not present

## 2024-03-21 DIAGNOSIS — I10 Essential (primary) hypertension: Secondary | ICD-10-CM | POA: Diagnosis not present

## 2024-03-21 DIAGNOSIS — I4821 Permanent atrial fibrillation: Secondary | ICD-10-CM | POA: Diagnosis not present

## 2024-03-21 DIAGNOSIS — D6869 Other thrombophilia: Secondary | ICD-10-CM | POA: Diagnosis not present

## 2024-03-21 DIAGNOSIS — J8410 Pulmonary fibrosis, unspecified: Secondary | ICD-10-CM | POA: Diagnosis not present

## 2024-03-21 DIAGNOSIS — I25810 Atherosclerosis of coronary artery bypass graft(s) without angina pectoris: Secondary | ICD-10-CM | POA: Diagnosis not present

## 2024-03-27 DIAGNOSIS — G4733 Obstructive sleep apnea (adult) (pediatric): Secondary | ICD-10-CM | POA: Diagnosis not present

## 2024-03-29 DIAGNOSIS — G4733 Obstructive sleep apnea (adult) (pediatric): Secondary | ICD-10-CM | POA: Diagnosis not present

## 2024-04-20 DIAGNOSIS — J9611 Chronic respiratory failure with hypoxia: Secondary | ICD-10-CM | POA: Diagnosis not present

## 2024-04-22 ENCOUNTER — Ambulatory Visit: Admission: EM | Admit: 2024-04-22 | Discharge: 2024-04-22 | Disposition: A | Discharge status: Home / Self Care

## 2024-04-22 DIAGNOSIS — S61411A Laceration without foreign body of right hand, initial encounter: Secondary | ICD-10-CM | POA: Diagnosis not present

## 2024-04-22 DIAGNOSIS — I739 Peripheral vascular disease, unspecified: Secondary | ICD-10-CM

## 2024-04-22 DIAGNOSIS — L03113 Cellulitis of right upper limb: Secondary | ICD-10-CM

## 2024-04-22 DIAGNOSIS — I10 Essential (primary) hypertension: Secondary | ICD-10-CM | POA: Diagnosis not present

## 2024-04-22 DIAGNOSIS — T148XXD Other injury of unspecified body region, subsequent encounter: Secondary | ICD-10-CM | POA: Diagnosis not present

## 2024-04-22 MED ORDER — CEPHALEXIN 500 MG PO CAPS: 500.0000 mg | ORAL_CAPSULE | Freq: Three times a day (TID) | ORAL | 0 refills | Status: AC

## 2024-04-22 NOTE — ED Triage Notes (Addendum)
 Pt presents to UC for LAC on R thumb x2 wks. States slammed it against his car door. Last tetanus 03/11/23.

## 2024-04-22 NOTE — ED Provider Notes (Addendum)
 [facility]    CSN: [id number] Arrival date & time: 04/22/24  0915      History   Chief Complaint Chief Complaint  Patient presents with   Laceration    HPI [name]. is a 83 y.o. male presenting for evaluation of laceration occurred 2 weeks ago when he slammed his hand in a door. He reports the wound has not healed up and he is wondering if something can be done to "push it together" now. Denies any pain. There is mild surrounding redness but he says he does not know how long it has been like that. No pustular drainage.   Medical history significant for coronary artery disease, peripheral artery disease, pulmonary fibrosis, sleep apnea, AAA, COPD, gout, hyperlipidemia, obesity, atrial fibrillation on long-term anticoagulants and mitral valve insufficiency.  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   Cellulitis of leg, left 11/07/2018   Lymphedema 08/17/2018   Carotid stenosis 08/14/2018   CAD (coronary artery disease) 08/12/2018   COPD (chronic obstructive pulmonary disease) (HCC) 08/12/2018   Gout 08/12/2018   Hyperglycemia 08/12/2018   Mixed hyperlipidemia 08/12/2018   Atherosclerosis of native arteries of extremity with intermittent claudication (HCC) 08/12/2018   Bilateral leg edema 07/12/2018   SOBOE (shortness of breath on exertion) 07/12/2018   Left  wrist pain 10/07/2017   Morbid obesity (HCC) 10/07/2017   Radial styloid tenosynovitis (de quervain) 10/07/2017   Senile purpura (HCC) 09/20/2017   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/18/2016   Pulmonary fibrosis (HCC) 03/13/2016   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   Essential tremor 11/05/2015   Permanent atrial fibrillation (HCC) 09/30/2015   Benign essential hypertension 05/10/2015   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   Right knee pain 02/26/2015   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/12/2015   OSA (obstructive sleep apnea) 02/12/2015    Past Surgical History:  Procedure Laterality Date   CARDIAC SURGERY     CARDIOVERSION N/A 03/03/2018   Procedure: CARDIOVERSION;  Surgeon: Michelle Aid, MD;  Location: [facility];  Service: Cardiovascular;  Laterality: N/A;   CARDIOVERSION N/A 05/24/2018   Procedure: CARDIOVERSION;  Surgeon: Michelle Aid, MD;  Location: [facility];  Service: Cardiovascular;  Laterality: N/A;   CATARACT EXTRACTION W/PHACO Right 06/03/2020   Procedure: CATARACT EXTRACTION PHACO AND INTRAOCULAR LENS PLACEMENT (IOC) RIGHT 6.07  00:48.0;  Surgeon: Rosa College, MD;  Location: [facility];  Service: Ophthalmology;  Laterality: Right;  sleep apnea   CORONARY ARTERY BYPASS GRAFT     EYE SURGERY     LEG SURGERY Left    stents placed, Fem-Fem bypass   LOWER EXTREMITY ANGIOGRAPHY Left 09/01/2022   Procedure: Lower Extremity Angiography;  Surgeon: Jackquelyn Mass, MD;  Location: [facility];  Service: Cardiovascular;  Laterality: Left;  RIGHT/LEFT HEART CATH AND CORONARY ANGIOGRAPHY Bilateral 02/12/2022   Procedure: RIGHT/LEFT HEART CATH AND CORONARY ANGIOGRAPHY;  Surgeon: Michelle Aid, MD;  Location: [facility];  Service: Cardiovascular;  Laterality: Bilateral;   TONSILLECTOMY         Home Medications    Prior to Admission medications   Medication Sig Start Date End Date Taking? Authorizing Provider   acetaminophen  (TYLENOL ) 650 MG CR tablet Take 1,300 mg by mouth every 8 (eight) hours as needed for pain.    Yes [provider]  allopurinol (ZYLOPRIM) 100 MG tablet Take 1 tablet (100 mg total) by mouth once daily 09/13/23 09/12/24 Yes [provider]  ALPRAZolam (XANAX) 0.25 MG tablet Take 0.25 mg by mouth 3 (three) times daily as needed for anxiety. 05/05/18  Yes [provider]  atorvastatin  (LIPITOR) 40 MG tablet Take 40 mg by mouth daily. 11/26/21  Yes [provider]  cephALEXin (KEFLEX) 500 MG capsule Take 1 capsule (500 mg total) by mouth 3 (three) times daily for 7 days. 04/22/24 04/29/24 Yes Floydene Hy, PA-C  diltiazem  (CARDIZEM  CD) 120 MG 24 hr capsule Take 120 mg by mouth 2 (two) times daily. 07/04/18  Yes [provider]  Dupilumab 300 MG/2ML SOAJ Inject 2 mLs (300 mg total) subcutaneously every 14 (fourteen) days 02/21/24  Yes [provider]  ELIQUIS 5 MG TABS tablet Take 1 tablet by mouth in the morning and at bedtime. 06/20/22  Yes [provider]  escitalopram (LEXAPRO) 5 MG tablet Take 5 mg by mouth daily. 07/28/22  Yes [provider]  fluticasone-salmeterol (ADVAIR) 250-50 MCG/ACT AEPB Inhale 1 Puff into the lungs every 12 (twelve) hours Rinse mouth after each use 12/07/23 12/06/24 Yes [provider]  furosemide (LASIX) 40 MG tablet Take 40 mg by mouth daily. 03/05/16  Yes [provider]  levothyroxine  (SYNTHROID ) 25 MCG tablet Take 25 mcg by mouth every morning. 12/31/21  Yes [provider]  metoprolol  succinate (TOPROL -XL) 50 MG 24 hr tablet Take 50 mg by mouth daily. 09/07/18  Yes [provider]  Multiple Vitamin (MULTIVITAMIN) tablet Take 1 tablet by mouth daily. Centrum for men   Yes [provider]  spironolactone (ALDACTONE) 25 MG tablet Take 12.5 mg by mouth daily. 07/16/22  Yes [provider]  telmisartan (MICARDIS) 40 MG tablet Take 40 mg by  mouth daily. 12/14/21  Yes [provider]  torsemide (DEMADEX) 10 MG tablet Take 10 mg by mouth daily. 07/16/22  Yes [provider]  Treprostinil (TYVASO DPI MAINTENANCE KIT) 16 MCG POWD Inhale 16 % into the lungs 4 (four) times daily.   Yes [provider]    Family History Family History  Problem Relation Age of Onset   Thyroid  disease Mother    Macular degeneration Mother    Cancer Father     Social History Social History   Tobacco Use   Smoking status: Former    Current packs/day: 0.00    Types: Cigarettes    Quit date: 02/16/1979    Years since quitting: 45.2   Smokeless tobacco: Never  Vaping Use   Vaping status: Never Used  Substance Use Topics   Alcohol  use: Yes    Comment: rarely   Drug use: No     Allergies   Fish allergy, Spironolactone, Hydrocodone , Tramadol , Hydrochlorothiazide, Iodine , and Shellfish allergy   Review of Systems Review of Systems  Constitutional:  Negative for fatigue and fever.  Musculoskeletal:  Negative for  arthralgias and joint swelling.  Skin:  Positive for color change and wound.  Neurological:  Negative for weakness and numbness.     Physical Exam Triage Vital Signs ED Triage Vitals [04/22/24 0936]  Encounter Vitals Group     BP      Systolic BP Percentile      Diastolic BP Percentile      Pulse      Resp 16     Temp      Temp Source Oral     SpO2      Weight      Height      Head Circumference      Peak Flow      Pain Score      Pain Loc      Pain Education      Exclude from Growth Chart    No data found.  Updated Vital Signs BP (!) 159/84 (BP Location: Left Arm)   Pulse 95   Temp 98.8 F (37.1 C) (Oral)   Resp 16   Ht 5\' 11"  (1.803 m)   Wt 230 lb (104.3 kg)   SpO2 92%   BMI 32.08 kg/m      Physical Exam Vitals and nursing note reviewed.  Constitutional:      General: He is not in acute distress.    Appearance: Normal appearance. He is well-developed. He is not  ill-appearing.  HENT:     Head: Normocephalic and atraumatic.  Eyes:     General: No scleral icterus.    Conjunctiva/sclera: Conjunctivae normal.  Cardiovascular:     Rate and Rhythm: Normal rate.     Pulses: Normal pulses.  Pulmonary:     Effort: Pulmonary effort is normal. No respiratory distress.  Musculoskeletal:     Cervical back: Neck supple.     Comments: RIGHT HAND: See image included in chart. There is a 2 cm laceration/open wound of the dorsal thumb with surrounding erythema. No bleeding or drainage. Good pulses. Mild TTP base of thumb. Full ROM.  Skin:    General: Skin is warm and dry.     Capillary Refill: Capillary refill takes less than 2 seconds.  Neurological:     General: No focal deficit present.     Mental Status: He is alert. Mental status is at baseline.     Motor: No weakness.     Gait: Gait normal.  Psychiatric:        Mood and Affect: Mood normal.        Behavior: Behavior normal.      UC Treatments / Results  Labs (all labs ordered are listed, but only abnormal results are displayed) Labs Reviewed - No data to display  EKG   Radiology No results found.  Procedures Procedures (including critical care time)  Medications Ordered in UC Medications - No data to display  Initial Impression / Assessment and Plan / UC Course  I have reviewed the triage vital signs and the nursing notes.  Pertinent labs & imaging results that were available during my care of the patient were reviewed by me and considered in my medical decision making (see chart for details).   83 year old male with history of vascular disease, pulmonary fibrosis and hypertension, on long-term anticoagulants presents for 2-week history of poorly healing wound at the right hand/thumb.  Has been cleaning the area with soap and water and applying Neosporin.  Denies any significant pain.  Injury occurred after he slammed his hand in a  door.  Blood pressure 159/84.  Patient takes  diltiazem , furosemide, metoprolol  and telmisartan.  Advised to continue medications and keep a log of BP.  If consistently greater than 140/90 should follow-up with PCP and/or cardiologist  See image included in chart of patient's hand/thumb.  There is a 2 cm laceration which is open at the base of the thumb with surrounding erythema.  Area is mildly tender so I suggested an x-ray but he declines.  Possible mild cellulitis.  Treating at this time with Keflex.  Discussed wound care guidelines.  I placed referral to wound care since his wound has not closed after 2 weeks.  Reviewed return precautions.  Final Clinical Impressions(s) / UC Diagnoses   Final diagnoses:  Laceration of right hand without foreign body, initial encounter  Delayed wound healing  Cellulitis of right hand  Essential hypertension  Peripheral vascular disease (HCC)     Discharge Instructions      - Start antibiotics for possible early cellulitis/skin infection. - Continue to clean the area with soap and water and apply scant amount of Neosporin or triple antibiotic ointment. - I put a referral to wound care for you since this is an open wound has not fully healed yet.   ED Prescriptions     Medication Sig Dispense Auth. Provider   cephALEXin (KEFLEX) 500 MG capsule Take 1 capsule (500 mg total) by mouth 3 (three) times daily for 7 days. 21 capsule Floydene Hy, PA-C      PDMP not reviewed this encounter.       Floydene Hy, PA-C 04/22/24 1002

## 2024-04-22 NOTE — Discharge Instructions (Addendum)
-   Start antibiotics for possible early cellulitis/skin infection. - Continue to clean the area with soap and water and apply scant amount of Neosporin or triple antibiotic ointment. - I put a referral to wound care for you since this is an open wound has not fully healed yet.

## 2024-04-24 DIAGNOSIS — S61011D Laceration without foreign body of right thumb without damage to nail, subsequent encounter: Secondary | ICD-10-CM | POA: Diagnosis not present

## 2024-04-25 ENCOUNTER — Encounter (INDEPENDENT_AMBULATORY_CARE_PROVIDER_SITE_OTHER): Payer: Self-pay

## 2024-04-26 DIAGNOSIS — G4733 Obstructive sleep apnea (adult) (pediatric): Secondary | ICD-10-CM | POA: Diagnosis not present

## 2024-04-27 DIAGNOSIS — R0602 Shortness of breath: Secondary | ICD-10-CM | POA: Diagnosis not present

## 2024-04-27 DIAGNOSIS — I4819 Other persistent atrial fibrillation: Secondary | ICD-10-CM | POA: Diagnosis not present

## 2024-04-27 DIAGNOSIS — J449 Chronic obstructive pulmonary disease, unspecified: Secondary | ICD-10-CM | POA: Diagnosis not present

## 2024-04-27 DIAGNOSIS — R5383 Other fatigue: Secondary | ICD-10-CM | POA: Diagnosis not present

## 2024-04-28 DIAGNOSIS — H353111 Nonexudative age-related macular degeneration, right eye, early dry stage: Secondary | ICD-10-CM | POA: Diagnosis not present

## 2024-04-28 DIAGNOSIS — H3530 Unspecified macular degeneration: Secondary | ICD-10-CM | POA: Diagnosis not present

## 2024-04-28 DIAGNOSIS — H43811 Vitreous degeneration, right eye: Secondary | ICD-10-CM | POA: Diagnosis not present

## 2024-04-28 DIAGNOSIS — Z961 Presence of intraocular lens: Secondary | ICD-10-CM | POA: Diagnosis not present

## 2024-05-04 DIAGNOSIS — D6869 Other thrombophilia: Secondary | ICD-10-CM | POA: Diagnosis not present

## 2024-05-04 DIAGNOSIS — J8410 Pulmonary fibrosis, unspecified: Secondary | ICD-10-CM | POA: Diagnosis not present

## 2024-05-04 DIAGNOSIS — J9611 Chronic respiratory failure with hypoxia: Secondary | ICD-10-CM | POA: Diagnosis not present

## 2024-05-04 DIAGNOSIS — I739 Peripheral vascular disease, unspecified: Secondary | ICD-10-CM | POA: Diagnosis not present

## 2024-05-04 DIAGNOSIS — I724 Aneurysm of artery of lower extremity: Secondary | ICD-10-CM | POA: Diagnosis not present

## 2024-05-04 DIAGNOSIS — I4821 Permanent atrial fibrillation: Secondary | ICD-10-CM | POA: Diagnosis not present

## 2024-05-04 DIAGNOSIS — I10 Essential (primary) hypertension: Secondary | ICD-10-CM | POA: Diagnosis not present

## 2024-05-04 DIAGNOSIS — E118 Type 2 diabetes mellitus with unspecified complications: Secondary | ICD-10-CM | POA: Diagnosis not present

## 2024-05-04 DIAGNOSIS — I7143 Infrarenal abdominal aortic aneurysm, without rupture: Secondary | ICD-10-CM | POA: Diagnosis not present

## 2024-05-04 DIAGNOSIS — J449 Chronic obstructive pulmonary disease, unspecified: Secondary | ICD-10-CM | POA: Diagnosis not present

## 2024-05-04 DIAGNOSIS — D692 Other nonthrombocytopenic purpura: Secondary | ICD-10-CM | POA: Diagnosis not present

## 2024-05-04 DIAGNOSIS — I77810 Thoracic aortic ectasia: Secondary | ICD-10-CM | POA: Diagnosis not present

## 2024-05-09 DIAGNOSIS — J9611 Chronic respiratory failure with hypoxia: Secondary | ICD-10-CM | POA: Diagnosis not present

## 2024-05-09 DIAGNOSIS — J849 Interstitial pulmonary disease, unspecified: Secondary | ICD-10-CM | POA: Diagnosis not present

## 2024-05-09 DIAGNOSIS — J8410 Pulmonary fibrosis, unspecified: Secondary | ICD-10-CM | POA: Diagnosis not present

## 2024-05-09 DIAGNOSIS — J449 Chronic obstructive pulmonary disease, unspecified: Secondary | ICD-10-CM | POA: Diagnosis not present

## 2024-05-21 DIAGNOSIS — J9611 Chronic respiratory failure with hypoxia: Secondary | ICD-10-CM | POA: Diagnosis not present

## 2024-05-27 DIAGNOSIS — G4733 Obstructive sleep apnea (adult) (pediatric): Secondary | ICD-10-CM | POA: Diagnosis not present

## 2024-06-08 ENCOUNTER — Ambulatory Visit
Admission: EM | Admit: 2024-06-08 | Discharge: 2024-06-08 | Disposition: A | Discharge status: Home / Self Care | Attending: Family Medicine | Admitting: Family Medicine

## 2024-06-08 ENCOUNTER — Ambulatory Visit (INDEPENDENT_AMBULATORY_CARE_PROVIDER_SITE_OTHER)

## 2024-06-08 DIAGNOSIS — M109 Gout, unspecified: Secondary | ICD-10-CM

## 2024-06-08 DIAGNOSIS — M25562 Pain in left knee: Secondary | ICD-10-CM | POA: Diagnosis not present

## 2024-06-08 DIAGNOSIS — M1712 Unilateral primary osteoarthritis, left knee: Secondary | ICD-10-CM | POA: Diagnosis not present

## 2024-06-08 MED ORDER — PREDNISONE 20 MG PO TABS: 40.0000 mg | ORAL_TABLET | Freq: Every day | ORAL | 0 refills | Status: AC

## 2024-06-08 NOTE — Discharge Instructions (Signed)
 Start prednisone  daily for 5 days.  Please monitor your symptoms closely and follow up with your PCP in 2 to 3 days for recheck.  Please go to the emergency room for any worsening symptoms. I hope you feel better soon!

## 2024-06-08 NOTE — ED Provider Notes (Signed)
 [facility]    CSN: [id number] Arrival date & time: 06/08/24  1007      History   Chief Complaint Chief Complaint  Patient presents with   Knee Pain    HPI [name]. is a 83 y.o. male presents for evaluation of knee pain.  Patient reports 3 days of a intermittent, lateral left knee pain.  Denies any known injury or inciting event.  Does state the area is red and warm but denies any scratching or abrasions to the area.  Does have a hx of gout. States these symptoms are similar to previous gout flares. He does take allopurinol daily. States pain is primarily with walking or palpation otherwise no pain.  No history of injuries or surgeries to the knee.  He has been doing Salonpas OTC for symptoms.  In addition O2 on intake was found to be 88%.  Recheck with 90%.  Patient has history of pulmonary fibrosis and COPD and states that since normal O2 range.   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   Cellulitis of leg, left 11/07/2018   Lymphedema 08/17/2018   Carotid stenosis 08/14/2018   CAD (coronary artery disease) 08/12/2018   COPD (chronic obstructive pulmonary disease) (HCC) 08/12/2018   Gout 08/12/2018   Hyperglycemia 08/12/2018   Mixed hyperlipidemia 08/12/2018   Atherosclerosis of native arteries of extremity with intermittent claudication (HCC)  08/12/2018   Bilateral leg edema 07/12/2018   SOBOE (shortness of breath on exertion) 07/12/2018   Left wrist pain 10/07/2017   Morbid obesity (HCC) 10/07/2017   Radial styloid tenosynovitis (de quervain) 10/07/2017   Senile purpura (HCC) 09/20/2017   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/18/2016   Pulmonary fibrosis (HCC) 03/13/2016   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   Essential tremor 11/05/2015   Permanent atrial fibrillation (HCC) 09/30/2015   Benign essential hypertension 05/10/2015   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   Right knee pain 02/26/2015   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/12/2015   OSA (obstructive sleep apnea) 02/12/2015    Past Surgical History:  Procedure Laterality Date   CARDIAC SURGERY     CARDIOVERSION N/A 03/03/2018   Procedure: CARDIOVERSION;  Surgeon: Hester Wolm PARAS, MD;  Location: [facility];  Service: Cardiovascular;  Laterality: N/A;   CARDIOVERSION N/A 05/24/2018   Procedure: CARDIOVERSION;  Surgeon: Hester Wolm PARAS, MD;  Location: [facility];  Service: Cardiovascular;  Laterality: N/A;   CATARACT EXTRACTION W/PHACO Right 06/03/2020   Procedure: CATARACT EXTRACTION PHACO AND INTRAOCULAR LENS PLACEMENT (IOC) RIGHT 6.07  00:48.0;  Surgeon: Myrna Adine Anes, MD;  Location: [facility];  Service: Ophthalmology;  Laterality: Right;  sleep apnea   CORONARY ARTERY BYPASS GRAFT     EYE SURGERY     LEG SURGERY Left  stents placed, Fem-Fem bypass   LOWER EXTREMITY ANGIOGRAPHY Left 09/01/2022   Procedure: Lower Extremity Angiography;  Surgeon: Jama Cordella MATSU, MD;  Location: [facility];  Service: Cardiovascular;  Laterality: Left;   RIGHT/LEFT HEART CATH AND CORONARY ANGIOGRAPHY Bilateral 02/12/2022   Procedure: RIGHT/LEFT HEART CATH AND CORONARY ANGIOGRAPHY;  Surgeon: Hester Wolm PARAS, MD;  Location: [facility];  Service: Cardiovascular;  Laterality: Bilateral;   TONSILLECTOMY         Home Medications     Prior to Admission medications   Medication Sig Start Date End Date Taking? Authorizing Provider  acetaminophen  (TYLENOL ) 650 MG CR tablet Take 1,300 mg by mouth every 8 (eight) hours as needed for pain.    Yes [provider]  allopurinol (ZYLOPRIM) 100 MG tablet Take 1 tablet (100 mg total) by mouth once daily 09/13/23 09/12/24 Yes [provider]  ALPRAZolam (XANAX) 0.25 MG tablet Take 0.25 mg by mouth 3 (three) times daily as needed for anxiety. 05/05/18  Yes [provider]  atorvastatin  (LIPITOR) 40 MG tablet Take 40 mg by mouth daily. 11/26/21  Yes [provider]  diltiazem  (CARDIZEM  CD) 120 MG 24 hr capsule Take 120 mg by mouth 2 (two) times daily. 07/04/18  Yes [provider]  Dupilumab 300 MG/2ML SOAJ Inject 2 mLs (300 mg total) subcutaneously every 14 (fourteen) days 02/21/24  Yes [provider]  ELIQUIS 5 MG TABS tablet Take 1 tablet by mouth in the morning and at bedtime. 06/20/22  Yes [provider]  escitalopram (LEXAPRO) 5 MG tablet Take 5 mg by mouth daily. 07/28/22  Yes [provider]  fluticasone-salmeterol (ADVAIR) 250-50 MCG/ACT AEPB Inhale 1 Puff into the lungs every 12 (twelve) hours Rinse mouth after each use 12/07/23 12/06/24 Yes [provider]  furosemide (LASIX) 40 MG tablet Take 40 mg by mouth daily. 03/05/16  Yes [provider]  levothyroxine  (SYNTHROID ) 25 MCG tablet Take 25 mcg by mouth every morning. 12/31/21  Yes [provider]  metoprolol  succinate (TOPROL -XL) 50 MG 24 hr tablet Take 50 mg by mouth daily. 09/07/18  Yes [provider]  Multiple Vitamin (MULTIVITAMIN) tablet Take 1 tablet by mouth daily. Centrum for men   Yes [provider]  predniSONE  (DELTASONE ) 20 MG tablet Take 2 tablets (40 mg total) by mouth daily with breakfast for 5 days. 06/08/24 06/13/24 Yes Gwin Eagon, Jodi R, NP  spironolactone (ALDACTONE) 25 MG tablet Take 12.5 mg by mouth daily.  07/16/22  Yes [provider]  telmisartan (MICARDIS) 40 MG tablet Take 40 mg by mouth daily. 12/14/21  Yes [provider]  torsemide (DEMADEX) 10 MG tablet Take 10 mg by mouth daily. 07/16/22  Yes [provider]  Treprostinil (TYVASO DPI MAINTENANCE KIT) 16 MCG POWD Inhale 16 % into the lungs 4 (four) times daily.   Yes [provider]    Family History Family History  Problem Relation Age of Onset   Thyroid  disease Mother    Macular degeneration Mother    Cancer Father     Social History Social History   Tobacco Use   Smoking status: Former    Current packs/day: 0.00    Types: Cigarettes    Quit date: 02/16/1979    Years since quitting: 45.3   Smokeless tobacco: Never  Vaping Use   Vaping status: Never Used  Substance Use Topics   Alcohol  use: Yes    Comment: rarely   Drug use: No  Allergies   Fish allergy, Spironolactone, Hydrocodone , Tramadol , Hydrochlorothiazide, Iodine , and Shellfish allergy   Review of Systems Review of Systems  Musculoskeletal:        Left knee pain      Physical Exam Triage Vital Signs ED Triage Vitals  Encounter Vitals Group     BP 06/08/24 1023 (!) 156/72     Girls Systolic BP Percentile --      Girls Diastolic BP Percentile --      Boys Systolic BP Percentile --      Boys Diastolic BP Percentile --      Pulse Rate 06/08/24 1023 83     Resp 06/08/24 1023 20     Temp 06/08/24 1023 98.1 F (36.7 C)     Temp Source 06/08/24 1023 Oral     SpO2 06/08/24 1023 (!) 86 %     Weight --      Height --      Head Circumference --      Peak Flow --      Pain Score 06/08/24 1022 7     Pain Loc --      Pain Education --      Exclude from Growth Chart --    No data found.  Updated Vital Signs BP (!) 156/72 (BP Location: Right Arm)   Pulse 83   Temp 98.1 F (36.7 C) (Oral)   Resp 20   SpO2 90%   Visual Acuity Right Eye Distance:   Left Eye Distance:   Bilateral Distance:    Right Eye  Near:   Left Eye Near:    Bilateral Near:     Physical Exam Vitals and nursing note reviewed.  Constitutional:      General: He is not in acute distress.    Appearance: Normal appearance. He is not ill-appearing.  HENT:     Head: Normocephalic and atraumatic.  Eyes:     Pupils: Pupils are equal, round, and reactive to light.  Cardiovascular:     Rate and Rhythm: Normal rate.  Pulmonary:     Effort: Pulmonary effort is normal.  Musculoskeletal:     Left knee: Erythema and bony tenderness present. No swelling, deformity, ecchymosis or lacerations. Normal range of motion. Tenderness present over the lateral joint line. No medial joint line or patellar tendon tenderness. Normal patellar mobility. Normal pulse.       Legs:     Comments: Pinpoint TTP to lateral left knee with mild erythema and warmth. Skin is intact with no abrasions. No edema.   Skin:    General: Skin is warm and dry.  Neurological:     General: No focal deficit present.     Mental Status: He is alert and oriented to person, place, and time.  Psychiatric:        Mood and Affect: Mood normal.        Behavior: Behavior normal.      UC Treatments / Results  Labs (all labs ordered are listed, but only abnormal results are displayed) Labs Reviewed - No data to display  EKG   Radiology DG Knee Complete 4 Views Left Result Date: 06/08/2024 CLINICAL DATA:  Left lateral knee pain for 3 days EXAM: LEFT KNEE - COMPLETE 4+ VIEW COMPARISON:  None Available. FINDINGS: Frontal, oblique, lateral, and sunrise views of the left knee are obtained. No fracture, subluxation, or dislocation. Mild 3 compartmental osteoarthritis greatest in the medial and patellofemoral compartments. No joint effusion. Multiple surgical clips and long  segment vascular stent identified within the dorsal soft tissues. IMPRESSION: 1. Mild 3 compartmental osteoarthritis. 2. No acute bony abnormality. Electronically Signed   By: Ozell Daring M.D.   On:  06/08/2024 10:54    Procedures Procedures (including critical care time)  Medications Ordered in UC Medications - No data to display  Initial Impression / Assessment and Plan / UC Course  I have reviewed the triage vital signs and the nursing notes.  Pertinent labs & imaging results that were available during my care of the patient were reviewed by me and considered in my medical decision making (see chart for details).     Reviewed exam and sx with patient. Xray with arthritis. Pt reports sx consistent with gout flares. Will start prednisone  as pt states this works the best for his gout. Low suspicion for cellulitis but did discuss starting Keflex  as area overlies a joint. Pt declined abx. Advised PCP follow up in 2-3 days for recheck. Strict ER precautions reviewed and pt verbalized understanding.  Final Clinical Impressions(s) / UC Diagnoses   Final diagnoses:  Acute pain of left knee  Acute gout of left knee, unspecified cause     Discharge Instructions      Start prednisone  daily for 5 days.  Please monitor your symptoms closely and follow up with your PCP in 2 to 3 days for recheck.  Please go to the emergency room for any worsening symptoms. I hope you feel better soon!     ED Prescriptions     Medication Sig Dispense Auth. Provider   predniSONE  (DELTASONE ) 20 MG tablet Take 2 tablets (40 mg total) by mouth daily with breakfast for 5 days. 10 tablet Christan Defranco, Jodi R, NP      PDMP not reviewed this encounter.   Loreda Myla SAUNDERS, NP 06/08/24 1110

## 2024-06-08 NOTE — ED Triage Notes (Signed)
 Left knee pain x 3 days. No injury. Patient has Pulmonary fibrosis. Normal O2 is 88

## 2024-06-20 DIAGNOSIS — J9611 Chronic respiratory failure with hypoxia: Secondary | ICD-10-CM | POA: Diagnosis not present

## 2024-06-21 DIAGNOSIS — J9611 Chronic respiratory failure with hypoxia: Secondary | ICD-10-CM | POA: Diagnosis not present

## 2024-06-21 DIAGNOSIS — I272 Pulmonary hypertension, unspecified: Secondary | ICD-10-CM | POA: Diagnosis not present

## 2024-06-21 DIAGNOSIS — J8410 Pulmonary fibrosis, unspecified: Secondary | ICD-10-CM | POA: Diagnosis not present

## 2024-06-21 DIAGNOSIS — J849 Interstitial pulmonary disease, unspecified: Secondary | ICD-10-CM | POA: Diagnosis not present

## 2024-06-26 DIAGNOSIS — G4733 Obstructive sleep apnea (adult) (pediatric): Secondary | ICD-10-CM | POA: Diagnosis not present

## 2024-06-27 DIAGNOSIS — R5381 Other malaise: Secondary | ICD-10-CM | POA: Diagnosis not present

## 2024-06-27 DIAGNOSIS — J9611 Chronic respiratory failure with hypoxia: Secondary | ICD-10-CM | POA: Diagnosis not present

## 2024-06-27 DIAGNOSIS — I272 Pulmonary hypertension, unspecified: Secondary | ICD-10-CM | POA: Diagnosis not present

## 2024-06-27 DIAGNOSIS — J8410 Pulmonary fibrosis, unspecified: Secondary | ICD-10-CM | POA: Diagnosis not present

## 2024-07-12 DIAGNOSIS — E782 Mixed hyperlipidemia: Secondary | ICD-10-CM | POA: Diagnosis not present

## 2024-07-12 DIAGNOSIS — I272 Pulmonary hypertension, unspecified: Secondary | ICD-10-CM | POA: Diagnosis not present

## 2024-07-12 DIAGNOSIS — I10 Essential (primary) hypertension: Secondary | ICD-10-CM | POA: Diagnosis not present

## 2024-07-12 DIAGNOSIS — I25810 Atherosclerosis of coronary artery bypass graft(s) without angina pectoris: Secondary | ICD-10-CM | POA: Diagnosis not present

## 2024-07-12 DIAGNOSIS — Z951 Presence of aortocoronary bypass graft: Secondary | ICD-10-CM | POA: Diagnosis not present

## 2024-07-12 DIAGNOSIS — I4821 Permanent atrial fibrillation: Secondary | ICD-10-CM | POA: Diagnosis not present

## 2024-07-24 ENCOUNTER — Encounter: Payer: Self-pay | Admitting: Internal Medicine

## 2024-07-24 ENCOUNTER — Ambulatory Visit
Admission: RE | Admit: 2024-07-24 | Discharge: 2024-07-24 | Disposition: A | Discharge status: Home / Self Care | Attending: Internal Medicine | Admitting: Internal Medicine

## 2024-07-24 ENCOUNTER — Encounter
Admission: RE | Disposition: A | Discharge status: Home / Self Care | Payer: Self-pay | Source: Home / Self Care | Attending: Internal Medicine

## 2024-07-24 DIAGNOSIS — R0602 Shortness of breath: Secondary | ICD-10-CM | POA: Diagnosis not present

## 2024-07-24 HISTORY — PX: RIGHT HEART CATH: CATH118263

## 2024-07-24 LAB — POCT I-STAT EG7
Acid-Base Excess: 1 mmol/L (ref 0.0–2.0)
Bicarbonate: 27.1 mmol/L (ref 20.0–28.0)
Calcium, Ion: 1.25 mmol/L (ref 1.15–1.40)
HCT: 42 % (ref 39.0–52.0)
Hemoglobin: 14.3 g/dL (ref 13.0–17.0)
O2 Saturation: 54 %
Potassium: 4 mmol/L (ref 3.5–5.1)
Sodium: 142 mmol/L (ref 135–145)
TCO2: 29 mmol/L (ref 22–32)
pCO2, Ven: 45.9 mmHg (ref 44–60)
pH, Ven: 7.38 (ref 7.25–7.43)
pO2, Ven: 29 mmHg — CL (ref 32–45)

## 2024-07-24 SURGERY — RIGHT HEART CATH
Anesthesia: Moderate Sedation | Laterality: Right

## 2024-07-24 MED ORDER — LIDOCAINE HCL 1 % IJ SOLN
INTRAMUSCULAR | Status: AC
  Filled 2024-07-24: qty 20

## 2024-07-24 MED ORDER — MIDAZOLAM HCL 2 MG/2ML IJ SOLN
INTRAMUSCULAR | Status: AC
  Filled 2024-07-24: qty 2

## 2024-07-24 MED ORDER — SODIUM CHLORIDE 0.9 % IV SOLN
250.0000 mL | INTRAVENOUS | Status: DC | PRN
  Administered 2024-07-24: 250 mL via INTRAVENOUS

## 2024-07-24 MED ORDER — HEPARIN (PORCINE) IN NACL 1000-0.9 UT/500ML-% IV SOLN
INTRAVENOUS | Status: AC
  Filled 2024-07-24: qty 1000

## 2024-07-24 MED ORDER — FREE WATER: 500.0000 mL | Freq: Once | Status: DC

## 2024-07-24 MED ORDER — HEPARIN (PORCINE) IN NACL 1000-0.9 UT/500ML-% IV SOLN
INTRAVENOUS | Status: DC | PRN
  Administered 2024-07-24: 1000 mL

## 2024-07-24 MED ORDER — SODIUM CHLORIDE 0.9% FLUSH: 3.0000 mL | Freq: Two times a day (BID) | INTRAVENOUS | Status: DC

## 2024-07-24 MED ORDER — FENTANYL CITRATE (PF) 100 MCG/2ML IJ SOLN
INTRAMUSCULAR | Status: AC
  Filled 2024-07-24: qty 2

## 2024-07-24 MED ORDER — SODIUM CHLORIDE 0.9% FLUSH: 3.0000 mL | INTRAVENOUS | Status: DC | PRN

## 2024-07-24 MED ORDER — LIDOCAINE HCL (PF) 1 % IJ SOLN
INTRAMUSCULAR | Status: DC | PRN
  Administered 2024-07-24 (×2): 2 mL

## 2024-07-24 SURGICAL SUPPLY — 6 items
CATH BALLN WEDGE 5F 110CM (CATHETERS) IMPLANT
DRAPE BRACHIAL (DRAPES) IMPLANT
PACK CARDIAC CATH (CUSTOM PROCEDURE TRAY) ×1 IMPLANT
SET ATX-X65L (MISCELLANEOUS) IMPLANT
SHEATH GLIDE SLENDER 4/5FR (SHEATH) IMPLANT
STATION PROTECTION PRESSURIZED (MISCELLANEOUS) IMPLANT

## 2024-07-24 NOTE — Discharge Instructions (Signed)
Right Heart Cath, Care After This sheet gives you information about how to care for yourself after your procedure. Your health care provider may also give you more specific instructions. If you have problems or questions, contact your health care provider. What can I expect after the procedure? After the procedure, it is common to have: Bruising or mild discomfort in the area where the IV was inserted (insertion site). Follow these instructions at home: Eating and drinking  You may eat and drink after your procedure.  Drink a lot of fluids for the first several days after the procedure, as directed by your health care provider. This helps to wash (flush) the contrast out of your body. Examples of healthy fluids include water or low-calorie drinks. General instructions Check your IV insertion area and also your venous access site every day for signs of infection. Check for: Redness, swelling, or pain. Fluid or blood. Warmth. Pus or a bad smell. Take over-the-counter and prescription medicines only as told by your health care provider. Rest and return to your normal activities as told by your health care provider. Ask your health care provider what activities are safe for you. Do not drive for 24 hours if you were given a medicine to help you relax (sedative), or until your health care provider approves. Keep all follow-up visits as told by your health care provider. This is important. Contact a health care provider if: Your skin becomes itchy or you develop a rash or hives. You have a fever that does not get better with medicine. You feel nauseous. You vomit. You have redness, swelling, or pain around the insertion site. You have fluid or blood coming from the insertion site. Your insertion area feels warm to the touch. You have pus or a bad smell coming from the insertion site. Get help right away if: You have difficulty breathing or shortness of breath. You develop chest pain. You  faint. You feel very dizzy. These symptoms may represent a serious problem that is an emergency. Do not wait to see if the symptoms will go away. Get medical help right away. Call your local emergency services (911 in the [place] Do not drive yourself to the hospital. Summary After your procedure, it is common to have bruising or mild discomfort in the area where the IV was inserted. You should check your IV insertion area every day for signs of infection. Take over-the-counter and prescription medicines only as told by your health care provider. You should drink a lot of fluids for the first several days after the procedure to help flush the contrast from your body. This information is not intended to replace advice given to you by your health care provider. Make sure you discuss any questions you have with your health care provider. Document Released: 09/13/2013 Document Revised: 11/05/2017 Document Reviewed: 10/17/2016 Elsevier Patient Education  2020 Elsevier Inc[employer] 

## 2024-07-27 DIAGNOSIS — G4733 Obstructive sleep apnea (adult) (pediatric): Secondary | ICD-10-CM | POA: Diagnosis not present

## 2024-08-05 ENCOUNTER — Ambulatory Visit: Admission: EM | Admit: 2024-08-05 | Discharge: 2024-08-05 | Disposition: A | Discharge status: Home / Self Care

## 2024-08-05 DIAGNOSIS — K2900 Acute gastritis without bleeding: Secondary | ICD-10-CM

## 2024-08-05 DIAGNOSIS — K5900 Constipation, unspecified: Secondary | ICD-10-CM | POA: Diagnosis not present

## 2024-08-05 MED ORDER — METOCLOPRAMIDE HCL 10 MG PO TABS: 10.0000 mg | ORAL_TABLET | Freq: Four times a day (QID) | ORAL | 2 refills | Status: DC

## 2024-08-05 NOTE — ED Triage Notes (Signed)
 Pt c/o abdominal pain x3-4days  Pt states that his O2 is normally 88. Pt states he has pulmonary fibrosis and has a oxygen  concentrator in his car.   Pt states that his pain is across the upper abdomen and mid back.  Pt had a heart cath placed 2 weeks ago  Pt is concerned about possible gas and states the pain feels like gas  Pt has taken Tums and states that it has not helped the symptoms

## 2024-08-05 NOTE — ED Provider Notes (Signed)
 [facility]  Note:  This document was prepared using Conservation officer, historic buildings and may include unintentional dictation errors.  MRN: [id number] DOB: 1941/03/21  Subjective:   [name]. is a 83 y.o. male presenting for upper abdominal pressure and gastritis symptoms that occur usually at night for the last 3 to 4 days.  Patient reports that pain is across his upper abdomen and mid back and is relieved with taking Tums.  Patient states that he feels like he is bloated and has extra gas.  Patient reports a history of constipation.  Last bowel movement was this morning was very hard and has not had a bowel movement for about 4 days prior to today.  Patient reports that he had a heart cath approximately 2 weeks ago and was concerned that might be related to that.  Patient denies any chest pain, shortness of breath, weakness, dizziness.  Patient has not used any MiraLAX or stool softeners for constipation symptoms.  Patient has not taken any other antiacid medication or acid reducers.  No current facility-administered medications for this encounter.  Current Outpatient Medications:    acetaminophen  (TYLENOL ) 650 MG CR tablet, Take 1,300 mg by mouth every 8 (eight) hours as needed for pain. , Disp: , Rfl:    allopurinol (ZYLOPRIM) 100 MG tablet, Take 100 mg by mouth., Disp: , Rfl:    ALPRAZolam (XANAX) 0.25 MG tablet, Take 0.25 mg by mouth 3 (three) times daily as needed for anxiety., Disp: , Rfl: 5   atorvastatin  (LIPITOR) 40 MG tablet, Take 40 mg by mouth daily., Disp: , Rfl:    ELIQUIS 5 MG TABS tablet, Take 5 mg by mouth in the morning and at bedtime., Disp: , Rfl:    EPINEPHrine  0.3 mg/0.3 mL IJ SOAJ injection, Inject 0.3 mg into the muscle as needed for anaphylaxis., Disp: , Rfl:    escitalopram (LEXAPRO) 5 MG tablet, Take 5 mg by mouth daily., Disp: , Rfl:    fluticasone-salmeterol (ADVAIR) 250-50 MCG/ACT AEPB, Inhale 1 puff into the lungs 2 (two) times daily as  needed (Asthma)., Disp: , Rfl:    ipratropium-albuterol  (DUONEB) 0.5-2.5 (3) MG/3ML SOLN, Inhale 3 mLs into the lungs every 6 (six) hours as needed (Wheezing/SOB)., Disp: , Rfl:    levothyroxine  (SYNTHROID ) 25 MCG tablet, Take 25 mcg by mouth every morning., Disp: , Rfl:    metoCLOPramide  (REGLAN ) 10 MG tablet, Take 1 tablet (10 mg total) by mouth every 6 (six) hours., Disp: 30 tablet, Rfl: 2   metoprolol  succinate (TOPROL -XL) 50 MG 24 hr tablet, Take 50 mg by mouth daily., Disp: , Rfl:    ranolazine (RANEXA) 500 MG 12 hr tablet, Take 500 mg by mouth 2 (two) times daily., Disp: , Rfl:    tirzepatide (MOUNJARO) 2.5 MG/0.5ML Pen, Inject 2.5 mg into the skin once a week., Disp: , Rfl:    torsemide (DEMADEX) 20 MG tablet, Take 20 mg by mouth daily., Disp: , Rfl:    Treprostinil Sodium (YUTREPIA) 26.5 MCG CAPS, Take 26.5 mcg by mouth in the morning, at noon, in the evening, and at bedtime., Disp: , Rfl:    diltiazem  (CARDIZEM  CD) 120 MG 24 hr capsule, Take 120 mg by mouth 2 (two) times daily., Disp: , Rfl:    Allergies  Allergen Reactions   Fish Allergy Hives   Spironolactone Other (See Comments)    gynecomastia   Hydrocodone       confusion   Tramadol  Other (See Comments)  confusion   Hydrochlorothiazide Other (See Comments)    worsens gout   Iodine  Rash   Shellfish Allergy Rash    Past Medical History:  Diagnosis Date   Allergy    Arthritis    Coronary artery disease    COVID-19 11/19/2019   Dysrhythmia    Hypertension    Peripheral vascular disease (HCC)    Pulmonary fibrosis (HCC)    Sleep apnea    CPAP   Tremor      Past Surgical History:  Procedure Laterality Date   CARDIAC SURGERY     CARDIOVERSION N/A 03/03/2018   Procedure: CARDIOVERSION;  Surgeon: Hester Wolm PARAS, MD;  Location: [facility];  Service: Cardiovascular;  Laterality: N/A;   CARDIOVERSION N/A 05/24/2018   Procedure: CARDIOVERSION;  Surgeon: Hester Wolm PARAS, MD;  Location: [facility];  Service:  Cardiovascular;  Laterality: N/A;   CATARACT EXTRACTION W/PHACO Right 06/03/2020   Procedure: CATARACT EXTRACTION PHACO AND INTRAOCULAR LENS PLACEMENT (IOC) RIGHT 6.07  00:48.0;  Surgeon: Myrna Adine Anes, MD;  Location: [facility];  Service: Ophthalmology;  Laterality: Right;  sleep apnea   CORONARY ARTERY BYPASS GRAFT     EYE SURGERY     LEG SURGERY Left    stents placed, Fem-Fem bypass   LOWER EXTREMITY ANGIOGRAPHY Left 09/01/2022   Procedure: Lower Extremity Angiography;  Surgeon: Jama Cordella MATSU, MD;  Location: [facility];  Service: Cardiovascular;  Laterality: Left;   RIGHT HEART CATH Right 07/24/2024   Procedure: RIGHT HEART CATH;  Surgeon: Florencio Cara BIRCH, MD;  Location: [facility];  Service: Cardiovascular;  Laterality: Right;   RIGHT/LEFT HEART CATH AND CORONARY ANGIOGRAPHY Bilateral 02/12/2022   Procedure: RIGHT/LEFT HEART CATH AND CORONARY ANGIOGRAPHY;  Surgeon: Hester Wolm PARAS, MD;  Location: [facility];  Service: Cardiovascular;  Laterality: Bilateral;   TONSILLECTOMY      Family History  Problem Relation Age of Onset   Thyroid  disease Mother    Macular degeneration Mother    Cancer Father     Social History   Tobacco Use   Smoking status: Former    Current packs/day: 0.00    Types: Cigarettes    Quit date: 02/16/1979    Years since quitting: 45.4   Smokeless tobacco: Never  Vaping Use   Vaping status: Never Used  Substance Use Topics   Alcohol  use: Yes    Comment: rarely   Drug use: No    ROS Refer to HPI for ROS details.  Objective:   Vitals: BP 134/79 (BP Location: Left Arm)   Pulse 86   Temp 98.3 F (36.8 C) (Oral)   SpO2 90%   Physical Exam Vitals and nursing note reviewed.  Constitutional:      General: He is not in acute distress.    Appearance: Normal appearance. He is well-developed. He is not ill-appearing or toxic-appearing.  HENT:     Head: Normocephalic.  Cardiovascular:     Rate and  Rhythm: Normal rate.  Pulmonary:     Effort: Pulmonary effort is normal. No respiratory distress.  Abdominal:     General: There is distension.     Palpations: Abdomen is soft.     Tenderness: There is abdominal tenderness in the epigastric area and left upper quadrant. There is no right CVA tenderness, left CVA tenderness, guarding or rebound.  Skin:    General: Skin is warm and dry.  Neurological:     General: No focal deficit present.  Mental Status: He is alert and oriented to person, place, and time.  Psychiatric:        Mood and Affect: Mood normal.        Behavior: Behavior normal.     Procedures  No results found for this or any previous visit (from the past 24 hours).  Assessment and Plan :     Discharge Instructions       1. Acute gastritis without hemorrhage, unspecified gastritis type (Primary) - Take over-the-counter Pepcid  Complete 1 to 2 tablets as needed for gastric discomfort. - metoCLOPramide  (REGLAN ) 10 MG tablet; Take 1 tablet (10 mg total) by mouth every 6 (six) hours.  Dispense: 30 tablet; Refill: 2 - Ambulatory referral to Gastroenterology for follow-up evaluation if symptoms do not improve.  2. Constipation, unspecified constipation type - Take daily over-the-counter stool softener to minimize constipation symptoms. - Use over-the-counter MiraLAX as needed every 2 to 3 days to keep bowel movements regular and minimize constipation. -Continue to monitor symptoms ranging in severity if there is any escalation of recurrent symptoms or development of new symptoms such as severe abdominal pain, nausea/vomiting, unrelieved constipation, blood in stool or vomit follow-up in the ER immediately for further evaluation and management.      Nyeli Holtmeyer B Araf [place]   Emillia Weatherly, [place], [place] 08/05/24 1029

## 2024-08-05 NOTE — Discharge Instructions (Addendum)
  1. Acute gastritis without hemorrhage, unspecified gastritis type (Primary) - Take over-the-counter Pepcid  Complete 1 to 2 tablets as needed for gastric discomfort. - metoCLOPramide  (REGLAN ) 10 MG tablet; Take 1 tablet (10 mg total) by mouth every 6 (six) hours.  Dispense: 30 tablet; Refill: 2 - Ambulatory referral to Gastroenterology for follow-up evaluation if symptoms do not improve.  2. Constipation, unspecified constipation type - Take daily over-the-counter stool softener to minimize constipation symptoms. - Use over-the-counter MiraLAX as needed every 2 to 3 days to keep bowel movements regular and minimize constipation. -Continue to monitor symptoms ranging in severity if there is any escalation of recurrent symptoms or development of new symptoms such as severe abdominal pain, nausea/vomiting, unrelieved constipation, blood in stool or vomit follow-up in the ER immediately for further evaluation and management.

## 2024-08-08 DIAGNOSIS — R1084 Generalized abdominal pain: Secondary | ICD-10-CM | POA: Diagnosis not present

## 2024-08-11 ENCOUNTER — Ambulatory Visit
Admission: EM | Admit: 2024-08-11 | Discharge: 2024-08-11 | Disposition: A | Discharge status: Home / Self Care | Attending: Emergency Medicine | Admitting: Emergency Medicine

## 2024-08-11 DIAGNOSIS — S46011A Strain of muscle(s) and tendon(s) of the rotator cuff of right shoulder, initial encounter: Secondary | ICD-10-CM

## 2024-08-11 DIAGNOSIS — I10 Essential (primary) hypertension: Secondary | ICD-10-CM

## 2024-08-11 MED ORDER — DICLOFENAC SODIUM 1 % EX GEL
2.0000 g | Freq: Four times a day (QID) | CUTANEOUS | 0 refills | Status: DC
Start: 2024-08-11 — End: 2024-10-19

## 2024-08-11 NOTE — ED Provider Notes (Signed)
 [facility]    CSN: [id number] Arrival date & time: 08/11/24  0909      History   Chief Complaint Chief Complaint  Patient presents with   Shoulder Pain    HPI [name]. is a 83 y.o. male.   HPI  83 year old male with past medical history significant for essential hypertension, bilateral lower extremity edema, CAD, COPD, essential tremor, gout, hyperglycemia, mixed hyperlipidemia, OSA, morbid obesity, pulmonary fibrosis, atrial fibrillation, pulmonary hypertension presents for evaluation of 3 days worth of right shoulder pain.  He reports that he was helping a handicapped woman in a wheelchair and hurt his shoulder while attempting to lift and move her.  He describes a constant shooting pain out of his shoulder and is worried he injured his rotator cuff.  He denies any chest pain or swelling of his lower extremities.  He reports that his shortness of breath is at baseline.  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   Cellulitis of leg, left 11/07/2018   Lymphedema 08/17/2018   Carotid stenosis 08/14/2018   CAD (coronary artery disease) 08/12/2018   COPD (chronic obstructive pulmonary disease) (HCC) 08/12/2018   Gout 08/12/2018   Hyperglycemia 08/12/2018   Mixed hyperlipidemia 08/12/2018   Atherosclerosis of native arteries of extremity with intermittent claudication  (HCC) 08/12/2018   Bilateral leg edema 07/12/2018   SOBOE (shortness of breath on exertion) 07/12/2018   Left wrist pain 10/07/2017   Morbid obesity (HCC) 10/07/2017   Radial styloid tenosynovitis (de quervain) 10/07/2017   Senile purpura (HCC) 09/20/2017   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/18/2016   Pulmonary fibrosis (HCC) 03/13/2016   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   Essential tremor 11/05/2015   Permanent atrial fibrillation (HCC) 09/30/2015   Benign essential hypertension 05/10/2015   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   Right knee pain 02/26/2015   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/12/2015   OSA (obstructive sleep apnea) 02/12/2015    Past Surgical History:  Procedure Laterality Date   CARDIAC SURGERY     CARDIOVERSION N/A 03/03/2018   Procedure: CARDIOVERSION;  Surgeon: Hester Wolm PARAS, MD;  Location: [facility];  Service: Cardiovascular;  Laterality: N/A;   CARDIOVERSION N/A 05/24/2018   Procedure: CARDIOVERSION;  Surgeon: Hester Wolm PARAS, MD;  Location: [facility];  Service: Cardiovascular;  Laterality: N/A;   CATARACT EXTRACTION W/PHACO Right 06/03/2020   Procedure: CATARACT EXTRACTION PHACO AND INTRAOCULAR LENS PLACEMENT (IOC) RIGHT 6.07  00:48.0;  Surgeon: Myrna Adine Anes, MD;  Location: [facility];  Service: Ophthalmology;  Laterality: Right;  sleep apnea   CORONARY ARTERY BYPASS GRAFT     EYE SURGERY     LEG SURGERY Left    stents placed, Fem-Fem bypass   LOWER EXTREMITY ANGIOGRAPHY Left 09/01/2022   Procedure: Lower Extremity Angiography;  Surgeon: Jama Cordella MATSU, MD;  Location: [facility];  Service: Cardiovascular;  Laterality: Left;   RIGHT HEART CATH Right 07/24/2024   Procedure: RIGHT HEART CATH;  Surgeon: Florencio Cara BIRCH, MD;  Location: [facility] INVASIVE CV LAB;  Service: Cardiovascular;  Laterality: Right;   RIGHT/LEFT HEART CATH AND CORONARY ANGIOGRAPHY Bilateral 02/12/2022   Procedure: RIGHT/LEFT HEART CATH AND  CORONARY ANGIOGRAPHY;  Surgeon: Hester Wolm PARAS, MD;  Location: [facility] INVASIVE CV LAB;  Service: Cardiovascular;  Laterality: Bilateral;   TONSILLECTOMY         Home Medications    Prior to Admission medications   Medication Sig Start Date End Date Taking? Authorizing Provider  acetaminophen  (TYLENOL ) 650 MG CR tablet Take 1,300 mg by mouth every 8 (eight) hours as needed for pain.    Yes [provider]  allopurinol (ZYLOPRIM) 100 MG tablet Take 100 mg by mouth. 09/13/23  Yes [provider]  ALPRAZolam (XANAX) 0.25 MG tablet Take 0.25 mg by mouth 3 (three) times daily as needed for anxiety. 05/05/18  Yes [provider]  atorvastatin  (LIPITOR) 40 MG tablet Take 40 mg by mouth daily. 11/26/21  Yes [provider]  diclofenac  Sodium (VOLTAREN ) 1 % GEL Apply 2 g topically 4 (four) times daily. 08/11/24  Yes Bernardino Ditch, NP  diltiazem  (CARDIZEM  CD) 120 MG 24 hr capsule Take 120 mg by mouth 2 (two) times daily. 03/29/24  Yes [provider]  ELIQUIS 5 MG TABS tablet Take 5 mg by mouth in the morning and at bedtime. 06/20/22  Yes [provider]  EPINEPHrine  0.3 mg/0.3 mL IJ SOAJ injection Inject 0.3 mg into the muscle as needed for anaphylaxis. 03/16/24  Yes [provider]  escitalopram (LEXAPRO) 5 MG tablet Take 5 mg by mouth daily. 07/28/22  Yes [provider]  fluticasone-salmeterol (ADVAIR) 250-50 MCG/ACT AEPB Inhale 1 puff into the lungs 2 (two) times daily as needed (Asthma). 12/07/23  Yes [provider]  ipratropium-albuterol  (DUONEB) 0.5-2.5 (3) MG/3ML SOLN Inhale 3 mLs into the lungs every 6 (six) hours as needed (Wheezing/SOB). 05/23/24 05/18/25 Yes [provider]  levothyroxine  (SYNTHROID ) 25 MCG tablet Take 25 mcg by mouth every morning. 12/31/21  Yes [provider]  metoCLOPramide  (REGLAN ) 10 MG tablet Take 1 tablet (10 mg total) by mouth every 6 (six) hours. 08/05/24  Yes Reddick, Johnathan  B, NP  metoprolol  succinate (TOPROL -XL) 50 MG 24 hr tablet Take 50 mg by mouth daily. 09/07/18  Yes [provider]  ranolazine (RANEXA) 500 MG 12 hr tablet Take 500 mg by mouth 2 (two) times daily. 07/12/24 07/12/25 Yes [provider]  tirzepatide CLOYDE) 2.5 MG/0.5ML Pen Inject 2.5 mg into the skin once a week.   Yes [provider]  torsemide (DEMADEX) 20 MG tablet Take 20 mg by mouth daily. 07/16/22  Yes [provider]  Treprostinil Sodium (YUTREPIA) 26.5 MCG CAPS Take 26.5 mcg by mouth in the morning, at noon, in the evening, and at bedtime. 07/04/24  Yes [provider]    Family History Family History  Problem Relation Age of Onset   Thyroid  disease Mother    Macular degeneration Mother    Cancer Father     Social History Social History   Tobacco Use   Smoking status: Former    Current packs/day: 0.00    Types: Cigarettes    Quit date: 02/16/1979    Years since quitting: 45.5   Smokeless tobacco: Never  Vaping Use  Vaping status: Never Used  Substance Use Topics   Alcohol  use: Yes    Comment: rarely   Drug use: No     Allergies   Fish allergy, Spironolactone, Hydrocodone , Tramadol , Hydrochlorothiazide, Iodine , and Shellfish allergy   Review of Systems Review of Systems  Respiratory:  Positive for shortness of breath.   Cardiovascular:  Negative for chest pain, palpitations and leg swelling.  Musculoskeletal:  Positive for arthralgias. Negative for joint swelling.  Neurological:  Negative for dizziness and syncope.     Physical Exam Triage Vital Signs ED Triage Vitals  Encounter Vitals Group     BP      Girls Systolic BP Percentile      Girls Diastolic BP Percentile      Boys Systolic BP Percentile      Boys Diastolic BP Percentile      Pulse      Resp      Temp      Temp src      SpO2      Weight      Height      Head Circumference      Peak Flow      Pain Score      Pain Loc      Pain Education       Exclude from Growth Chart    No data found.  Updated Vital Signs BP (!) 182/118 (BP Location: Left Arm)   Pulse 80   Temp 97.8 F (36.6 C) (Oral)   Resp 20   Wt 223 lb 6.4 oz (101.3 kg)   SpO2 90%   BMI 31.16 kg/m   Visual Acuity Right Eye Distance:   Left Eye Distance:   Bilateral Distance:    Right Eye Near:   Left Eye Near:    Bilateral Near:     Physical Exam Vitals and nursing note reviewed.  Constitutional:      Appearance: Normal appearance. He is not ill-appearing.  HENT:     Head: Normocephalic and atraumatic.  Cardiovascular:     Rate and Rhythm: Normal rate. Rhythm irregular.     Pulses: Normal pulses.     Heart sounds: Normal heart sounds. No murmur heard.    No friction rub. No gallop.  Pulmonary:     Effort: Pulmonary effort is normal.     Breath sounds: Normal breath sounds. No wheezing, rhonchi or rales.  Musculoskeletal:     Right lower leg: No edema.     Left lower leg: No edema.  Skin:    General: Skin is warm and dry.     Capillary Refill: Capillary refill takes less than 2 seconds.  Neurological:     General: No focal deficit present.     Mental Status: He is alert and oriented to person, place, and time.      UC Treatments / Results  Labs (all labs ordered are listed, but only abnormal results are displayed) Labs Reviewed - No data to display  EKG Atrial fibrillation with a ventricular to 68 bpm PR intervals blank QRS duration 100 ms QT/QTc 406/431 ms Flattening of the ST segment through precordial leads.  Radiology No results found.  Procedures Procedures (including critical care time)  Medications Ordered in UC Medications - No data to display  Initial Impression / Assessment and Plan / UC Course  I have reviewed the triage vital signs and the nursing notes.  Pertinent labs & imaging results that were available during my care of  the patient were reviewed by me and considered in my medical decision making (see chart  for details).   Patient is a pleasant 83 year old male with past medical history significant for essential hypertension and pulmonary hypertension presenting for evaluation of 3 days worth of right shoulder pain.  He reports that he picked a lady up in a wheelchair to move her and believes he strained his right rotator cuff.  He is using Tylenol  at home but reports that for the last 2 nights it has interfered with his sleep.  No numbness or tingling in his fingers.  Of note is that the patient's blood pressure is 182/118 and his oxygen  saturation is 90%.  He is supposed to take diltiazem , metoprolol , torsemide, and treprostinil for pulmonary arterial hypertension.  He reports that he is not taking any of his medications this morning.  He is able to speak in full sentence without dyspnea or tachypnea and he reports that his shortness of breath is at baseline.  His heart sounds reveal atrial fibrillation with a regular rate and his lungs are clear to auscultation in all fields.  EKG shows atrial fibrillation with a ventricular rate of 68 bpm.  It is read and ST and T wave abnormalities and to consider anterolateral ischemia or digitalis effect.  These are similar to previous EKG from 2019.  He has no swelling on his lower extremities.  He is tender with palpation of the anterior right glenohumeral joint and has a positive empty can test suggesting rotator cuff strain.  Due to the fact the patient has elevated blood pressure, and the fact that he is taking Eliquis, it is not safe to give the patient corticosteroids for oral NSAIDs.  I told him we can try a topical Voltaren  gel along with ice, rest, and home physical therapy.  I also advised the patient that he needs to go home and take his prescribed medication.  He does have the ability to check his BP and oxygen  saturation at home and I have advised him that if his blood pressure and oxygen  saturation do not improve an hour after taking his medications he needs to  go to the emergency department.  He is in agreement.  Final Clinical Impressions(s) / UC Diagnoses   Final diagnoses:  Rotator cuff strain, right, initial encounter  Essential hypertension     Discharge Instructions      As we discussed, you need to go home and take the medications you are prescribed for your blood pressure and your pulmonary hypertension.  Monitor your blood pressure and oxygen  saturation at home.  If your blood pressure has not normalized and your oxygen  saturation does not improved 1 hour after you take your medications you need to go to the ER for evaluation.  For your rotator cuff strain I want you to apply 2 g of Voltaren  gel to your shoulder 4 times a day to help with pain and inflammation.  Rest your right shoulder is much as possible to decrease inflammation.  You may apply ice to your shoulder for 20 minutes at a time, 2-3 times a day, help with pain and inflammation.  Follow with the physical therapy exercises prescribed in your discharge instructions to help maintain range of motion and aid in pain relief.  If you do not have any improvement of your shoulder pain in the next 3 days I would recommend you follow-up with orthopedics such as [facility] in [place] or in [place].     ED  Prescriptions     Medication Sig Dispense Auth. Provider   diclofenac  Sodium (VOLTAREN ) 1 % GEL Apply 2 g topically 4 (four) times daily. 100 g Bernardino Ditch, NP      PDMP not reviewed this encounter.   Bernardino Ditch, NP 08/11/24 1039

## 2024-08-11 NOTE — ED Triage Notes (Signed)
 Pt c/o right shoulder pain x3days  Pt states that he was helping a handicapped woman in a wheelchair and hurt his shoulder while attempting to lift her and move her.   Pt states that he is having constant shooting pain out of his shoulder  Pt is worried that he injured his rotator cuff.  Pt had a right heart cath done on 07/24/24  Pt has SOB due to pulmonary hypertension and AFIB

## 2024-08-11 NOTE — Discharge Instructions (Addendum)
 As we discussed, you need to go home and take the medications you are prescribed for your blood pressure and your pulmonary hypertension.  Monitor your blood pressure and oxygen  saturation at home.  If your blood pressure has not normalized and your oxygen  saturation does not improved 1 hour after you take your medications you need to go to the ER for evaluation.  For your rotator cuff strain I want you to apply 2 g of Voltaren  gel to your shoulder 4 times a day to help with pain and inflammation.  Rest your right shoulder is much as possible to decrease inflammation.  You may apply ice to your shoulder for 20 minutes at a time, 2-3 times a day, help with pain and inflammation.  Follow with the physical therapy exercises prescribed in your discharge instructions to help maintain range of motion and aid in pain relief.  If you do not have any improvement of your shoulder pain in the next 3 days I would recommend you follow-up with orthopedics such as [facility] in [place] or in [place].

## 2024-08-15 ENCOUNTER — Other Ambulatory Visit (INDEPENDENT_AMBULATORY_CARE_PROVIDER_SITE_OTHER): Payer: Self-pay | Admitting: Vascular Surgery

## 2024-08-15 DIAGNOSIS — I724 Aneurysm of artery of lower extremity: Secondary | ICD-10-CM

## 2024-08-15 DIAGNOSIS — I70213 Atherosclerosis of native arteries of extremities with intermittent claudication, bilateral legs: Secondary | ICD-10-CM

## 2024-08-16 NOTE — Progress Notes (Unsigned)
 MRN : [id number]  [name]. is a 83 y.o. ([birth year]) male who presents with chief complaint of check circulation.  History of Present Illness:   The patient returns to the office for followup and review status post angiogram with intervention on 09/01/2022.  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  2.          Popliteal artery aneurysm.  3.          Abdominal aortic aneurysm.    Procedure 01/20/2012:        Percutaneous transluminal angioplasty of the peroneal artery to 3 mm.  2.          Percutaneous transluminal angioplasty of the posterior tibial artery to 3 mm.  3.          Percutaneous transluminal angioplasty of the superficial femoral and vein bypass graft to 8 mm.    The patient notes improvement in the lower extremity symptoms. No interval shortening of the patient's claudication distance or rest pain symptoms. No new ulcers or wounds have occurred since the last visit.   There have been no significant changes to the patient's overall health care.   No documented history of amaurosis fugax or recent TIA symptoms. There are no recent neurological changes noted. No documented history of DVT, PE or superficial thrombophlebitis. The patient denies recent episodes of angina or shortness of breath.    ABI's Rt=1.04 and Lt=1.17  (previous ABI's Rt=1.04 and Lt=1.17)   Duplex US  of the bilateral lower extremity arterial system shows uniform velocities without evidence of hemodynamically significant stenosis.  No growth of the popliteal aneurysms successful stenting left lower extremity.  Right popliteal artery aneurys measures 1.8 cm.   Previous duplex ultrasound of the abdominal  aorta demonstrates an abdominal aortic aneurysm measuring 2.68 cm no change compared to previous study.   Previous carotid duplex ultrasound demonstrates bilateral internal carotid artery stenosis of 1 to 39% no change compared to previous study  No outpatient medications have been marked as taking for the 08/17/24 encounter (Appointment) with Jama, Cordella MATSU, MD.    Past Medical History:  Diagnosis Date   Allergy    Arthritis    Coronary artery disease    COVID-19 11/19/2019   Dysrhythmia    Hypertension    Peripheral vascular disease (HCC)    Pulmonary fibrosis (HCC)    Sleep apnea    CPAP   Tremor     Past Surgical History:  Procedure Laterality Date   CARDIAC SURGERY     CARDIOVERSION N/A 03/03/2018   Procedure: CARDIOVERSION;  Surgeon: Hester Wolm PARAS, MD;  Location: [facility];  Service: Cardiovascular;  Laterality: N/A;   CARDIOVERSION N/A 05/24/2018   Procedure: CARDIOVERSION;  Surgeon: Hester Wolm PARAS, MD;  Location: [facility];  Service: Cardiovascular;  Laterality: N/A;   CATARACT EXTRACTION W/PHACO Right 06/03/2020  Procedure: CATARACT EXTRACTION PHACO AND INTRAOCULAR LENS PLACEMENT (IOC) RIGHT 6.07  00:48.0;  Surgeon: Myrna Adine Anes, MD;  Location: [facility];  Service: Ophthalmology;  Laterality: Right;  sleep apnea   CORONARY ARTERY BYPASS GRAFT     EYE SURGERY     LEG SURGERY Left    stents placed, Fem-Fem bypass   LOWER EXTREMITY ANGIOGRAPHY Left 09/01/2022   Procedure: Lower Extremity Angiography;  Surgeon: Jama Cordella MATSU, MD;  Location: [facility];  Service: Cardiovascular;  Laterality: Left;   RIGHT HEART CATH Right 07/24/2024   Procedure: RIGHT HEART CATH;  Surgeon: Florencio Cara BIRCH, MD;  Location: [facility];  Service: Cardiovascular;  Laterality: Right;   RIGHT/LEFT HEART CATH AND CORONARY ANGIOGRAPHY Bilateral 02/12/2022   Procedure: RIGHT/LEFT HEART CATH AND CORONARY ANGIOGRAPHY;  Surgeon: Hester Wolm PARAS, MD;   Location: [facility];  Service: Cardiovascular;  Laterality: Bilateral;   TONSILLECTOMY      Social History Social History   Tobacco Use   Smoking status: Former    Current packs/day: 0.00    Types: Cigarettes    Quit date: 02/16/1979    Years since quitting: 45.5   Smokeless tobacco: Never  Vaping Use   Vaping status: Never Used  Substance Use Topics   Alcohol  use: Yes    Comment: rarely   Drug use: No    Family History Family History  Problem Relation Age of Onset   Thyroid  disease Mother    Macular degeneration Mother    Cancer Father     Allergies  Allergen Reactions   Fish Allergy Hives   Spironolactone Other (See Comments)    gynecomastia   Hydrocodone       confusion   Tramadol  Other (See Comments)    confusion   Hydrochlorothiazide Other (See Comments)    worsens gout   Iodine  Rash   Shellfish Allergy Rash     REVIEW OF SYSTEMS (Negative unless checked)  Constitutional: [] Weight loss  [] Fever  [] Chills Cardiac: [] Chest pain   [] Chest pressure   [] Palpitations   [] Shortness of breath when laying flat   [] Shortness of breath with exertion. Vascular:  [x] Pain in legs with walking   [] Pain in legs at rest  [] History of DVT   [] Phlebitis   [] Swelling in legs   [] Varicose veins   [] Non-healing ulcers Pulmonary:   [] Uses home oxygen    [] Productive cough   [] Hemoptysis   [] Wheeze  [] COPD   [] Asthma Neurologic:  [] Dizziness   [] Seizures   [] History of stroke   [] History of TIA  [] Aphasia   [] Vissual changes   [] Weakness or numbness in arm   [] Weakness or numbness in leg Musculoskeletal:   [] Joint swelling   [] Joint pain   [] Low back pain Hematologic:  [] Easy bruising  [] Easy bleeding   [] Hypercoagulable state   [] Anemic Gastrointestinal:  [] Diarrhea   [] Vomiting  [] Gastroesophageal reflux/heartburn   [] Difficulty swallowing. Genitourinary:  [] Chronic kidney disease   [] Difficult urination  [] Frequent urination   [] Blood in urine Skin:  [] Rashes    [] Ulcers  Psychological:  [] History of anxiety   []  History of major depression.  Physical Examination  There were no vitals filed for this visit. There is no height or weight on file to calculate BMI. Gen: WD/WN, NAD Head: [place]/AT, No temporalis wasting.  Ear/Nose/Throat: Hearing grossly intact, nares w/o erythema or drainage Eyes: PER, EOMI, sclera nonicteric.  Neck: Supple, no masses.  No bruit or JVD.  Pulmonary:  Good  air movement, no audible wheezing, no use of accessory muscles.  Cardiac: RRR, normal S1, S2, no Murmurs. Vascular:  mild trophic changes, no open wounds Vessel Right Left  Radial Palpable Palpable  PT Not Palpable Not Palpable  DP Not Palpable Not Palpable  Gastrointestinal: soft, non-distended. No guarding/no peritoneal signs.  Musculoskeletal: M/S 5/5 throughout.  No visible deformity.  Neurologic: CN 2-12 intact. Pain and light touch intact in extremities.  Symmetrical.  Speech is fluent. Motor exam as listed above. Psychiatric: Judgment intact, Mood & affect appropriate for pt's clinical situation. Dermatologic: No rashes or ulcers noted.  No changes consistent with cellulitis.   CBC Lab Results  Component Value Date   WBC 8.2 02/16/2022   HGB 14.3 07/24/2024   HCT 42.0 07/24/2024   MCV 91.7 02/16/2022   PLT 149 (L) 02/16/2022    BMET    Component Value Date/Time   NA 142 07/24/2024 1326   NA 138 01/28/2012 0550   K 4.0 07/24/2024 1326   K 4.4 01/28/2012 0550   CL 106 02/16/2022 0311   CL 103 01/28/2012 0550   CO2 27 02/16/2022 0311   CO2 26 01/28/2012 0550   GLUCOSE 114 (H) 02/16/2022 0311   GLUCOSE 110 (H) 01/28/2012 0550   BUN 19 09/01/2022 0725   BUN 19 (H) 01/28/2012 0550   CREATININE 0.93 09/01/2022 0725   CREATININE 0.85 01/28/2012 0550   CALCIUM  8.3 (L) 02/16/2022 0311   CALCIUM  8.7 01/28/2012 0550   GFRNONAA >60 09/01/2022 0725   GFRNONAA >60 01/28/2012 0550   GFRAA >60 01/28/2012 0550   CrCl cannot be calculated (Patient's  most recent lab result is older than the maximum 21 days allowed.).  COAG Lab Results  Component Value Date   INR 1.4 (H) 02/15/2022   INR 1.45 09/09/2015   INR 1.9 01/29/2012    Radiology CARDIAC CATHETERIZATION Result Date: 07/24/2024   Hemodynamic findings consistent with moderate pulmonary hypertension.   Mean PA 41 mean wedge 18 Conclusion Successful right heart catheter right brachial approach Mean PA was 41 Mean wedge of 18 Consistent with moderate pulmonary hypertension Outpatient follow-up with pulmonary and primary physician     Assessment/Plan 1. Atherosclerosis of native artery of both lower extremities with intermittent claudication (HCC) (Primary) Recommend:   The patient has evidence of atherosclerosis of the lower extremities with claudication.  The patient does not voice lifestyle limiting changes at this point in time.   Noninvasive studies do not suggest clinically significant change.   No invasive studies, angiography or surgery at this time The patient should continue walking and begin a more formal exercise program.  The patient should continue antiplatelet therapy and aggressive treatment of the lipid abnormalities   No changes in the patient's medications at this time   Continued surveillance is indicated as atherosclerosis is likely to progress with time.     The patient will continue follow up with noninvasive studies as ordered.  - VAS US  AORTA/IVC/ILIACS; Future - VAS US  ABI WITH/WO TBI; Future  2. Bilateral carotid artery stenosis Recommend:   Given the patient's asymptomatic subcritical stenosis no further invasive testing or surgery at this time.   Duplex ultrasound shows 1-39% stenosis bilaterally.   Continue antiplatelet therapy as prescribed Continue management of CAD, HTN and Hyperlipidemia Healthy heart diet,  encouraged exercise at least 4 times per week Follow up in 24 months with duplex ultrasound and physical exam   3. Popliteal  artery aneurysm (HCC) Recommend: No surgery or intervention is  indicated at this time.  The patient is status post successful repair of his left popliteal artery aneurysm.  On the right the popliteal artery aneurysm measures 1.7 cm.  No indication for repair of the right lower extremity at this time.  The patient has an asymptomatic popliteal artery aneurysm that is less than 2.5 cm in maximal diameter.  I have discussed the natural history of popliteal aneurysm and the small risk of thrombosis for aneurysm less than 2.5 cm in size.  However, as these small aneurysms tend to enlarge over time, continued surveillance with ultrasound is mandatory.   I have also discussed optimizing medical management with hypertension and lipid control and the negative effect that any tobacco products have on aneurysmal disease.  The patient is also encouraged to exercise a minimum of 30 minutes 4 times a week.   Should the patient develop new leg pain or signs of peripheral embolization they are instructed to seek medical attention immediately and to alert the physician providing care that they have an aneurysm behind the knee.   The patient voices their understanding.  The patient will follow up as scheduled with duplex ultrasound of the lower extremities for surveillance.  4. Thoracic aortic ectasia (HCC) Recommend:  No surgery or intervention is indicated at this time.  The patient has a CT scan dated November 13, 2021 which demonstrates a 4.0 cm ascending thoracic aorta with a 3.6 cm descending thoracic aorta.  The patient has an asymptomatic thoracic aortic aneurysm that is less than 6.0 cm in maximal diameter.  I have discussed the natural history of thoracic aortic aneurysm and the small risk of rupture for aneurysm less than 6.5 cm in size.  However, as these small aneurysms tend to enlarge over time, continued surveillance with CT scan is mandatory.   I have also discussed optimizing medical  management with hypertension and lipid control and the negative effect that any tobacco products have on aneurysmal disease.  The patient is also encouraged to exercise a minimum of 30 minutes 4 times a week.   Should the patient develop new onset chest or back pain or signs of peripheral embolization they are instructed to seek medical attention immediately and to alert the physician providing care that they have an aneurysm in the chest.   The patient voices their understanding.  The patient will return as ordered with a CT scan of the chest  5. Infrarenal abdominal aortic aneurysm (AAA) without rupture (HCC) Recommend: No surgery or intervention is indicated at this time.   The patient has an asymptomatic abdominal aortic aneurysm that is less than 4 cm in maximal diameter.     I have reviewed the natural history of abdominal aortic aneurysm and the small risk of rupture for aneurysm less than 5 cm in size.   However, as these small aneurysms tend to enlarge over time, continued surveillance with ultrasound or CT scan is mandatory.    I have also discussed optimizing medical management with hypertension and lipid control and the importance of abstinence from tobacco.  The patient is also encouraged to exercise a minimum of 30 minutes 4 times a week.    Should the patient develop new onset abdominal or back pain or signs of peripheral embolization they are instructed to seek medical attention immediately and to alert the physician providing care that they have an aneurysm.    The patient voices their understanding.   The patient will return in 12 months with an aortic duplex. -  VAS US  AORTA/IVC/ILIACS; Future    Cordella Shawl, MD  08/16/2024 7:48 AM

## 2024-08-17 ENCOUNTER — Ambulatory Visit (INDEPENDENT_AMBULATORY_CARE_PROVIDER_SITE_OTHER): Payer: PPO

## 2024-08-17 ENCOUNTER — Other Ambulatory Visit (INDEPENDENT_AMBULATORY_CARE_PROVIDER_SITE_OTHER): Payer: PPO

## 2024-08-17 ENCOUNTER — Encounter (INDEPENDENT_AMBULATORY_CARE_PROVIDER_SITE_OTHER): Payer: Self-pay | Admitting: Vascular Surgery

## 2024-08-17 ENCOUNTER — Ambulatory Visit (INDEPENDENT_AMBULATORY_CARE_PROVIDER_SITE_OTHER): Payer: PPO | Admitting: Vascular Surgery

## 2024-08-17 ENCOUNTER — Other Ambulatory Visit (HOSPITAL_COMMUNITY): Payer: Self-pay

## 2024-08-17 VITALS — BP 128/89 | HR 80 | Wt 222.2 lb

## 2024-08-17 DIAGNOSIS — I724 Aneurysm of artery of lower extremity: Secondary | ICD-10-CM

## 2024-08-17 DIAGNOSIS — I6523 Occlusion and stenosis of bilateral carotid arteries: Secondary | ICD-10-CM

## 2024-08-17 DIAGNOSIS — I70213 Atherosclerosis of native arteries of extremities with intermittent claudication, bilateral legs: Secondary | ICD-10-CM | POA: Diagnosis not present

## 2024-08-17 DIAGNOSIS — I77810 Thoracic aortic ectasia: Secondary | ICD-10-CM

## 2024-08-17 DIAGNOSIS — I7143 Infrarenal abdominal aortic aneurysm, without rupture: Secondary | ICD-10-CM

## 2024-08-20 ENCOUNTER — Encounter (INDEPENDENT_AMBULATORY_CARE_PROVIDER_SITE_OTHER): Payer: Self-pay | Admitting: Vascular Surgery

## 2024-08-21 LAB — VAS US ABI WITH/WO TBI: Right ABI: 1.04

## 2024-08-24 DIAGNOSIS — J849 Interstitial pulmonary disease, unspecified: Secondary | ICD-10-CM | POA: Diagnosis not present

## 2024-08-24 DIAGNOSIS — R609 Edema, unspecified: Secondary | ICD-10-CM | POA: Diagnosis not present

## 2024-08-24 DIAGNOSIS — J4489 Other specified chronic obstructive pulmonary disease: Secondary | ICD-10-CM | POA: Diagnosis not present

## 2024-08-24 DIAGNOSIS — G4733 Obstructive sleep apnea (adult) (pediatric): Secondary | ICD-10-CM | POA: Diagnosis not present

## 2024-08-24 DIAGNOSIS — J8410 Pulmonary fibrosis, unspecified: Secondary | ICD-10-CM | POA: Diagnosis not present

## 2024-08-24 DIAGNOSIS — G4736 Sleep related hypoventilation in conditions classified elsewhere: Secondary | ICD-10-CM | POA: Diagnosis not present

## 2024-09-04 ENCOUNTER — Ambulatory Visit

## 2024-09-04 DIAGNOSIS — B351 Tinea unguium: Secondary | ICD-10-CM

## 2024-09-04 DIAGNOSIS — L84 Corns and callosities: Secondary | ICD-10-CM | POA: Diagnosis not present

## 2024-09-04 DIAGNOSIS — D239 Other benign neoplasm of skin, unspecified: Secondary | ICD-10-CM | POA: Diagnosis not present

## 2024-09-04 NOTE — Progress Notes (Signed)
 Subjective:  Patient ID: Collin KATHEE Franceen Mickey., male    DOB: 1941-06-01,  MRN: [id number]  Chief complaint: Painful calluses   83 y.o. male presents with the above complaint.  He states that he has had calluses to bilateral feet for an extended period of time.  They are very tender to palpation and can make walking difficult.  Denies any history of wounds to the area. Separately, his big toes are causing pain at the corner of the nails.   Review of Systems: Negative except as noted in the HPI. Denies N/V/F/Ch.  Past Medical History:  Diagnosis Date   Allergy    Arthritis    Coronary artery disease    COVID-19 11/19/2019   Dysrhythmia    Hypertension    Peripheral vascular disease    Pulmonary fibrosis (HCC)    Sleep apnea    CPAP   Tremor     Current Outpatient Medications:    acetaminophen  (TYLENOL ) 650 MG CR tablet, Take 1,300 mg by mouth every 8 (eight) hours as needed for pain. , Disp: , Rfl:    allopurinol (ZYLOPRIM) 100 MG tablet, Take 100 mg by mouth., Disp: , Rfl:    ALPRAZolam (XANAX) 0.25 MG tablet, Take 0.25 mg by mouth 3 (three) times daily as needed for anxiety., Disp: , Rfl: 5   atorvastatin  (LIPITOR) 40 MG tablet, Take 40 mg by mouth daily., Disp: , Rfl:    diclofenac  Sodium (VOLTAREN ) 1 % GEL, Apply 2 g topically 4 (four) times daily., Disp: 100 g, Rfl: 0   diltiazem  (CARDIZEM  CD) 120 MG 24 hr capsule, Take 120 mg by mouth 2 (two) times daily., Disp: , Rfl:    ELIQUIS 5 MG TABS tablet, Take 5 mg by mouth in the morning and at bedtime., Disp: , Rfl:    EPINEPHrine  0.3 mg/0.3 mL IJ SOAJ injection, Inject 0.3 mg into the muscle as needed for anaphylaxis., Disp: , Rfl:    escitalopram (LEXAPRO) 5 MG tablet, Take 5 mg by mouth daily., Disp: , Rfl:    fluticasone-salmeterol (ADVAIR) 250-50 MCG/ACT AEPB, Inhale 1 puff into the lungs 2 (two) times daily as needed (Asthma)., Disp: , Rfl:    ipratropium-albuterol  (DUONEB) 0.5-2.5 (3) MG/3ML SOLN, Inhale 3 mLs into the lungs  every 6 (six) hours as needed (Wheezing/SOB)., Disp: , Rfl:    levothyroxine  (SYNTHROID ) 25 MCG tablet, Take 25 mcg by mouth every morning., Disp: , Rfl:    metoCLOPramide  (REGLAN ) 10 MG tablet, Take 1 tablet (10 mg total) by mouth every 6 (six) hours., Disp: 30 tablet, Rfl: 2   metoprolol  succinate (TOPROL -XL) 50 MG 24 hr tablet, Take 50 mg by mouth daily., Disp: , Rfl:    ranolazine (RANEXA) 500 MG 12 hr tablet, Take 500 mg by mouth 2 (two) times daily., Disp: , Rfl:    tirzepatide (MOUNJARO) 2.5 MG/0.5ML Pen, Inject 2.5 mg into the skin once a week., Disp: , Rfl:    torsemide (DEMADEX) 20 MG tablet, Take 20 mg by mouth daily., Disp: , Rfl:    Treprostinil Sodium (YUTREPIA) 26.5 MCG CAPS, Take 26.5 mcg by mouth in the morning, at noon, in the evening, and at bedtime. (Patient not taking: Reported on 08/17/2024), Disp: , Rfl:   Social History   Tobacco Use  Smoking Status Former   Current packs/day: 0.00   Types: Cigarettes   Quit date: 02/16/1979   Years since quitting: 45.5  Smokeless Tobacco Never    Allergies  Allergen Reactions   Fish  Allergy Hives   Spironolactone Other (See Comments)    gynecomastia   Hydrocodone       confusion   Tramadol  Other (See Comments)    confusion   Hydrochlorothiazide Other (See Comments)    worsens gout   Iodine  Rash   Shellfish Allergy Rash   Objective:  There were no vitals filed for this visit. There is no height or weight on file to calculate BMI. Constitutional Well developed. Well nourished. Oriented to person, place, and time.  Vascular Dorsalis pedis pulses palpable bilaterally. Posterior tibial pulses palpable bilaterally. Capillary refill normal to all digits.  No cyanosis or clubbing noted. Pedal hair growth normal.  Neurologic Normal speech. Epicritic sensation to light touch grossly present bilaterally. Negative tinel sign at tarsal tunnel bilaterally.   Dermatologic Skin texture and turgor are within normal limits.  No  open wounds. Hyperkeratotic lesions noted at: bilateral sub 5th met head, right larger and more prominent than left Left hallux nail lateral border and right hallux nail lateral border minor ingrowing at distal aspect of nail. Skin growing into nailbed.   Musculoskeletal: 5/5 muscle strength all major pedal muscle groups. No major contributing hindfoot or forefoot deformity.     Assessment:   1. Corns and callosities   2. Benign epithelial neoplasm of skin   3. Onychomycosis    Plan:  - Patient was evaluated and treated and all questions answered.  Corns and Callosities/Benign epithelial neoplasm of skin - Discussed the diagnosis of multiple hyperkeratotic lesions that are painful to palpation with the patient.  We did discuss different treatment options ranging from conservative to surgical. - Today, we proceeded with debridement of the right foot hyperkeratotic lesion. There was some bleeding post debridement, area had silver nitrate applied and then dressed with abx ointment and bandaid.  The patient tolerated this well and expressed satisfaction and decrease in pain following debridement. Debridement performed with 312 blade.  - Patient will replace abx ointment and bandaid until area fully heals  Onychomycosis - Patient had palpation of the distal corner of bilateral hallux lateral nail borders.  There was no infection or drainage from the nail, there was some skin growth underneath the nail edge. - Debridement of hallux nails bilaterally performed without incident.  Patient did state that it felt better after this was performed.    RTC PRN  Prentice Ovens, DPM AACFAS [facility] Trained Podiatric Surgeon [facility]

## 2024-09-07 DIAGNOSIS — I7143 Infrarenal abdominal aortic aneurysm, without rupture: Secondary | ICD-10-CM | POA: Diagnosis not present

## 2024-09-07 DIAGNOSIS — J849 Interstitial pulmonary disease, unspecified: Secondary | ICD-10-CM | POA: Diagnosis not present

## 2024-09-07 DIAGNOSIS — J9611 Chronic respiratory failure with hypoxia: Secondary | ICD-10-CM | POA: Diagnosis not present

## 2024-09-07 DIAGNOSIS — I4821 Permanent atrial fibrillation: Secondary | ICD-10-CM | POA: Diagnosis not present

## 2024-09-07 DIAGNOSIS — J8410 Pulmonary fibrosis, unspecified: Secondary | ICD-10-CM | POA: Diagnosis not present

## 2024-09-07 DIAGNOSIS — Z23 Encounter for immunization: Secondary | ICD-10-CM | POA: Diagnosis not present

## 2024-09-07 DIAGNOSIS — I10 Essential (primary) hypertension: Secondary | ICD-10-CM | POA: Diagnosis not present

## 2024-09-07 DIAGNOSIS — E118 Type 2 diabetes mellitus with unspecified complications: Secondary | ICD-10-CM | POA: Diagnosis not present

## 2024-09-07 DIAGNOSIS — I739 Peripheral vascular disease, unspecified: Secondary | ICD-10-CM | POA: Diagnosis not present

## 2024-09-07 DIAGNOSIS — J449 Chronic obstructive pulmonary disease, unspecified: Secondary | ICD-10-CM | POA: Diagnosis not present

## 2024-09-07 DIAGNOSIS — I77810 Thoracic aortic ectasia: Secondary | ICD-10-CM | POA: Diagnosis not present

## 2024-09-07 DIAGNOSIS — I25810 Atherosclerosis of coronary artery bypass graft(s) without angina pectoris: Secondary | ICD-10-CM | POA: Diagnosis not present

## 2024-09-07 DIAGNOSIS — D6869 Other thrombophilia: Secondary | ICD-10-CM | POA: Diagnosis not present

## 2024-09-08 ENCOUNTER — Telehealth: Payer: Self-pay | Admitting: Lab

## 2024-09-08 DIAGNOSIS — I25810 Atherosclerosis of coronary artery bypass graft(s) without angina pectoris: Secondary | ICD-10-CM | POA: Diagnosis not present

## 2024-09-08 DIAGNOSIS — E118 Type 2 diabetes mellitus with unspecified complications: Secondary | ICD-10-CM | POA: Diagnosis not present

## 2024-09-08 DIAGNOSIS — I10 Essential (primary) hypertension: Secondary | ICD-10-CM | POA: Diagnosis not present

## 2024-09-08 NOTE — Telephone Encounter (Signed)
 Patient states was in office for treatment and now is in sever pain when walking is it possible to recommend something to numb area or pain medication tylenol  and motrin not helping please advise.

## 2024-09-21 DIAGNOSIS — Z951 Presence of aortocoronary bypass graft: Secondary | ICD-10-CM | POA: Diagnosis not present

## 2024-09-21 DIAGNOSIS — I10 Essential (primary) hypertension: Secondary | ICD-10-CM | POA: Diagnosis not present

## 2024-09-21 DIAGNOSIS — I272 Pulmonary hypertension, unspecified: Secondary | ICD-10-CM | POA: Diagnosis not present

## 2024-09-21 DIAGNOSIS — I25810 Atherosclerosis of coronary artery bypass graft(s) without angina pectoris: Secondary | ICD-10-CM | POA: Diagnosis not present

## 2024-09-21 DIAGNOSIS — I4821 Permanent atrial fibrillation: Secondary | ICD-10-CM | POA: Diagnosis not present

## 2024-09-21 DIAGNOSIS — J8410 Pulmonary fibrosis, unspecified: Secondary | ICD-10-CM | POA: Diagnosis not present

## 2024-09-27 DIAGNOSIS — R3989 Other symptoms and signs involving the genitourinary system: Secondary | ICD-10-CM | POA: Diagnosis not present

## 2024-09-27 DIAGNOSIS — R531 Weakness: Secondary | ICD-10-CM | POA: Diagnosis not present

## 2024-09-27 DIAGNOSIS — R5383 Other fatigue: Secondary | ICD-10-CM | POA: Diagnosis not present

## 2024-09-29 ENCOUNTER — Ambulatory Visit (INDEPENDENT_AMBULATORY_CARE_PROVIDER_SITE_OTHER)

## 2024-09-29 ENCOUNTER — Ambulatory Visit
Admission: EM | Admit: 2024-09-29 | Discharge: 2024-09-29 | Disposition: A | Discharge status: Home / Self Care | Attending: Family Medicine | Admitting: Family Medicine

## 2024-09-29 ENCOUNTER — Encounter: Payer: Self-pay | Admitting: Emergency Medicine

## 2024-09-29 ENCOUNTER — Telehealth: Payer: Self-pay | Admitting: Emergency Medicine

## 2024-09-29 ENCOUNTER — Ambulatory Visit: Payer: Self-pay | Admitting: Nurse Practitioner

## 2024-09-29 DIAGNOSIS — M549 Dorsalgia, unspecified: Secondary | ICD-10-CM

## 2024-09-29 DIAGNOSIS — S2241XA Multiple fractures of ribs, right side, initial encounter for closed fracture: Secondary | ICD-10-CM | POA: Diagnosis not present

## 2024-09-29 DIAGNOSIS — S29012A Strain of muscle and tendon of back wall of thorax, initial encounter: Secondary | ICD-10-CM | POA: Diagnosis not present

## 2024-09-29 MED ORDER — CYCLOBENZAPRINE HCL 5 MG PO TABS
5.0000 mg | ORAL_TABLET | Freq: Every evening | ORAL | 0 refills | Status: DC | PRN
Start: 2024-09-29 — End: 2024-10-19

## 2024-09-29 MED ORDER — LIDOCAINE 5 % EX PTCH: 1.0000 | MEDICATED_PATCH | CUTANEOUS | 0 refills | Status: DC

## 2024-09-29 NOTE — Discharge Instructions (Addendum)
 You may do the Lidoderm  patch to the back as needed.  Leave this on for 12 hours and remove for 12 hours.  May take over-the-counter Tylenol  as needed.  You may use Flexeril at night before bed only.  This medication will make you drowsy.  Do not drink alcohol  or drive while on this medication.  Heat to the back as needed.  Please follow-up with your PCP in 2 to 3 days for recheck.  Please go to the ER for any worsening symptoms.  I hope you feel better soon!

## 2024-09-29 NOTE — ED Provider Notes (Signed)
 [facility]    CSN: [id number] Arrival date & time: 09/29/24  1450      History   Chief Complaint Chief Complaint  Patient presents with   Back Pain    HPI [name]. is a 83 y.o. male with a past medical history of CAD, PVD, CPAP, pulmonary fibrosis, COPD presents for back pain.  Patient reports 3 days ago he was at a restaurant and due to some weakness he was unable to get out of his seat.  He had a bad another person at a restaurant pull him out of the seat.  He states he felt nothing at that time but the next day developed a persistent mid back pain that radiates around to his chest.  He states pain is primarily with movement or deep breathing but states at rest he has no pain.  He denies fall.  No shortness of breath or hemoptysis.  No neck or low back pain.  No numbness/tingling/weakness of his upper extremities.  He took his wife's baclofen and took 4 Tylenol  today and has also been using topical Voltaren  gel without improvement.  He is concerned he has done something to his ribs.  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   Cellulitis of leg, left 11/07/2018   Lymphedema 08/17/2018   Carotid stenosis 08/14/2018   CAD (coronary artery disease) 08/12/2018   COPD (chronic obstructive pulmonary disease) (HCC) 08/12/2018   Gout 08/12/2018   Hyperglycemia  08/12/2018   Mixed hyperlipidemia 08/12/2018   Atherosclerosis of native arteries of extremity with intermittent claudication 08/12/2018   Bilateral leg edema 07/12/2018   SOBOE (shortness of breath on exertion) 07/12/2018   Left wrist pain 10/07/2017   Morbid obesity (HCC) 10/07/2017   Radial styloid tenosynovitis (de quervain) 10/07/2017   Senile purpura 09/20/2017   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/18/2016   Pulmonary fibrosis (HCC) 03/13/2016   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   Essential tremor 11/05/2015   Permanent atrial fibrillation (HCC) 09/30/2015   Benign essential hypertension 05/10/2015   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   Right knee pain 02/26/2015   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/12/2015   OSA (obstructive sleep apnea) 02/12/2015    Past Surgical History:  Procedure Laterality Date   CARDIAC SURGERY     CARDIOVERSION N/A 03/03/2018   Procedure: CARDIOVERSION;  Surgeon: Hester Wolm PARAS, MD;  Location: [facility];  Service: Cardiovascular;  Laterality: N/A;   CARDIOVERSION N/A 05/24/2018   Procedure: CARDIOVERSION;  Surgeon: Hester Wolm PARAS, MD;  Location: [facility];  Service: Cardiovascular;  Laterality: N/A;   CATARACT EXTRACTION W/PHACO Right 06/03/2020   Procedure: CATARACT EXTRACTION PHACO AND INTRAOCULAR LENS PLACEMENT (IOC) RIGHT 6.07  00:48.0;  Surgeon: Myrna Adine Anes, MD;  Location: [facility];  Service: Ophthalmology;  Laterality: Right;  sleep apnea   CORONARY ARTERY BYPASS GRAFT     EYE SURGERY     LEG SURGERY Left    stents placed, Fem-Fem bypass   LOWER EXTREMITY ANGIOGRAPHY Left 09/01/2022   Procedure: Lower Extremity Angiography;  Surgeon: Jama Cordella MATSU, MD;  Location: [facility];  Service: Cardiovascular;  Laterality: Left;   RIGHT HEART CATH Right 07/24/2024   Procedure: RIGHT HEART CATH;  Surgeon: Florencio Cara BIRCH, MD;  Location: [facility];  Service: Cardiovascular;  Laterality:  Right;   RIGHT/LEFT HEART CATH AND CORONARY ANGIOGRAPHY Bilateral 02/12/2022   Procedure: RIGHT/LEFT HEART CATH AND CORONARY ANGIOGRAPHY;  Surgeon: Hester Wolm PARAS, MD;  Location: [facility];  Service: Cardiovascular;  Laterality: Bilateral;   TONSILLECTOMY         Home Medications    Prior to Admission medications   Medication Sig Start Date End Date Taking? Authorizing Provider  cyclobenzaprine (FLEXERIL) 5 MG tablet Take 1 tablet (5 mg total) by mouth at bedtime as needed for muscle spasms. 09/29/24  Yes Loreda Myla SAUNDERS, NP  lidocaine  (LIDODERM ) 5 % Place 1 patch onto the skin daily. Leave on the affected area for 12 hours and remove for 12 hours 09/29/24  Yes Lagina Reader, Jodi R, NP  acetaminophen  (TYLENOL ) 650 MG CR tablet Take 1,300 mg by mouth every 8 (eight) hours as needed for pain.     [provider]  allopurinol (ZYLOPRIM) 100 MG tablet Take 100 mg by mouth. 09/13/23   [provider]  ALPRAZolam (XANAX) 0.25 MG tablet Take 0.25 mg by mouth 3 (three) times daily as needed for anxiety. 05/05/18   [provider]  atorvastatin  (LIPITOR) 40 MG tablet Take 40 mg by mouth daily. 11/26/21   [provider]  diclofenac  Sodium (VOLTAREN ) 1 % GEL Apply 2 g topically 4 (four) times daily. 08/11/24   Bernardino Ditch, NP  diltiazem  (CARDIZEM  CD) 120 MG 24 hr capsule Take 120 mg by mouth 2 (two) times daily. 03/29/24   [provider]  ELIQUIS 5 MG TABS tablet Take 5 mg by mouth in the morning and at bedtime. 06/20/22   [provider]  EPINEPHrine  0.3 mg/0.3 mL IJ SOAJ injection Inject 0.3 mg into the muscle as needed for anaphylaxis. 03/16/24   [provider]  escitalopram (LEXAPRO) 5 MG tablet Take 5 mg by mouth daily. 07/28/22   [provider]  fluticasone-salmeterol (ADVAIR) 250-50 MCG/ACT AEPB Inhale 1 puff into the lungs 2 (two) times daily as needed (Asthma). 12/07/23   [provider]  ipratropium-albuterol   (DUONEB) 0.5-2.5 (3) MG/3ML SOLN Inhale 3 mLs into the lungs every 6 (six) hours as needed (Wheezing/SOB). 05/23/24 05/18/25  [provider]  levothyroxine  (SYNTHROID ) 25 MCG tablet Take 25 mcg by mouth every morning. 12/31/21   [provider]  metoCLOPramide  (REGLAN ) 10 MG tablet Take 1 tablet (10 mg total) by mouth every 6 (six) hours. 08/05/24   Reddick, Johnathan B, NP  metoprolol  succinate (TOPROL -XL) 50 MG 24 hr tablet Take 50 mg by mouth daily. 09/07/18   [provider]  ranolazine (RANEXA) 500 MG 12 hr tablet Take 500 mg by mouth 2 (two) times daily. 07/12/24 07/12/25  [provider]  tirzepatide CLOYDE) 2.5 MG/0.5ML Pen Inject 2.5 mg into the skin once a week.    [provider]  torsemide (DEMADEX) 20 MG tablet Take 20 mg by mouth daily. 07/16/22  [provider]  Treprostinil Sodium (YUTREPIA) 26.5 MCG CAPS Take 26.5 mcg by mouth in the morning, at noon, in the evening, and at bedtime. Patient not taking: Reported on 08/17/2024 07/04/24   [provider]    Family History Family History  Problem Relation Age of Onset   Thyroid  disease Mother    Macular degeneration Mother    Cancer Father     Social History Social History   Tobacco Use   Smoking status: Former    Current packs/day: 0.00    Types: Cigarettes    Quit date: 02/16/1979    Years since quitting: 45.6   Smokeless tobacco: Never  Vaping Use   Vaping status: Never Used  Substance Use Topics   Alcohol  use: Yes    Comment: rarely   Drug use: No     Allergies   Fish allergy, Spironolactone, Hydrocodone , Tramadol , Hydrochlorothiazide, Iodine , and Shellfish allergy   Review of Systems Review of Systems  Musculoskeletal:  Positive for back pain.     Physical Exam Triage Vital Signs ED Triage Vitals  Encounter Vitals Group     BP 09/29/24 1511 128/73     Girls Systolic BP Percentile --      Girls Diastolic BP Percentile --      Boys Systolic  BP Percentile --      Boys Diastolic BP Percentile --      Pulse Rate 09/29/24 1511 71     Resp 09/29/24 1511 16     Temp 09/29/24 1511 97.8 F (36.6 C)     Temp Source 09/29/24 1511 Oral     SpO2 09/29/24 1511 91 %     Weight 09/29/24 1510 222 lb 3.6 oz (100.8 kg)     Height 09/29/24 1510 5' 11 (1.803 m)     Head Circumference --      Peak Flow --      Pain Score 09/29/24 1509 10     Pain Loc --      Pain Education --      Exclude from Growth Chart --    No data found.  Updated Vital Signs BP 128/73 (BP Location: Right Arm)   Pulse 71   Temp 97.8 F (36.6 C) (Oral)   Resp 16   Ht 5' 11 (1.803 m)   Wt 222 lb 3.6 oz (100.8 kg)   SpO2 91%   BMI 30.99 kg/m   Visual Acuity Right Eye Distance:   Left Eye Distance:   Bilateral Distance:    Right Eye Near:   Left Eye Near:    Bilateral Near:     Physical Exam Vitals and nursing note reviewed.  Constitutional:      General: He is not in acute distress.    Appearance: Normal appearance. He is not ill-appearing.  HENT:     Head: Normocephalic and atraumatic.  Eyes:     Pupils: Pupils are equal, round, and reactive to light.  Cardiovascular:     Rate and Rhythm: Normal rate.  Pulmonary:     Effort: Pulmonary effort is normal.  Musculoskeletal:     Cervical back: No tenderness or bony tenderness.     Thoracic back: No swelling, edema, deformity, signs of trauma, lacerations, spasms, tenderness or bony tenderness. Normal range of motion. No scoliosis.       Back:     Comments: Patient points to mid back bilaterally as area of pain.  There is no pain with palpation but he does have  pain with deep inspiration  Skin:    General: Skin is warm and dry.  Neurological:     General: No focal deficit present.     Mental Status: He is alert and oriented to person, place, and time.  Psychiatric:        Mood and Affect: Mood normal.        Behavior: Behavior normal.      UC Treatments / Results  Labs (all labs  ordered are listed, but only abnormal results are displayed) Labs Reviewed - No data to display  Comprehensive Metabolic Panel (CMP) Order: [id number] Component Ref Range & Units 3 wk ago  Glucose 70 - 110 mg/dL 80  Sodium 863 - 854 mmol/L 144  Potassium 3.6 - 5.1 mmol/L 4.3  Chloride 97 - 109 mmol/L 106  Carbon Dioxide (CO2) 22.0 - 32.0 mmol/L 33.0 High   Urea  Nitrogen (BUN) 7 - 25 mg/dL 35 High   Creatinine 0.7 - 1.3 mg/dL 1.1  Glomerular Filtration Rate (eGFR) >60 mL/min/1.73sq m 67  Comment: CKD-EPI (2021) does not include patient's race in the calculation of eGFR.  Monitoring changes of plasma creatinine and eGFR over time is useful for monitoring kidney function.  Interpretive Ranges for eGFR (CKD-EPI 2021):  eGFR:       >60 mL/min/1.73 sq. m - Normal eGFR:       30-59 mL/min/1.73 sq. m - Moderately Decreased eGFR:       15-29 mL/min/1.73 sq. m  - Severely Decreased eGFR:       < 15 mL/min/1.73 sq. m  - Kidney Failure   Note: These eGFR calculations do not apply in acute situations when eGFR is changing rapidly or patients on dialysis.  Calcium  8.7 - 10.3 mg/dL 8.9  AST 8 - 39 U/L 32  ALT 6 - 57 U/L 47  Alk Phos (alkaline Phosphatase) 34 - 104 U/L 101  Albumin 3.5 - 4.8 g/dL 3.8  Bilirubin, Total 0.3 - 1.2 mg/dL 0.9  Protein, Total 6.1 - 7.9 g/dL 5.7 Low   A/G Ratio 1.0 - 5.0 gm/dL 2.0  Resulting Agency [facility]   Specimen Collected: 09/07/24 16:17   Performed by: [facility] Last Resulted: 09/07/24 17:32  Received From: [facility]  Result Received: 09/08/24 11:36    EKG   Radiology DG Ribs Bilateral W/Chest Result Date: 09/29/2024 EXAM: AP VIEW(S) XRAY OF THE BILATERAL RIBS AND CHEST 09/29/2024 04:06:33 PM COMPARISON: 11/19/2019. CLINICAL HISTORY: Patient states that he was eating out 3 days ago, and was unable to get up from his seat and another person tried to help lift him and felt pain in his  mid back. FINDINGS: BONES: No acute displaced rib fractures. Chronic right anterior rib fractures. Status post median sternotomy. There is fracture of the lowermost sternotomy wire. LUNGS AND PLEURA: There are diffuse reticular interstitial opacities within both lungs with a lower lung zone predominance compatible with chronic fibrotic interstitial lung disease, previously characterized by high-resolution CT of the chest as UIP per consensus guidelines. No consolidation or pulmonary edema. No pleural effusion or pneumothorax. HEART AND MEDIASTINUM: Calcified aorta. No acute abnormality of the cardiac and mediastinal silhouettes. IMAGED PORTIONS OF THE ABDOMEN: Moderate colonic stool burden identified within the imaged portions of the abdomen withgaseous distention of the small bowel loops IMPRESSION: 1. No acute displaced rib fractures. 2. Chronic right anterior rib fractures, severe. 3. 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 4. Moderate diffuse  colonic stool burden with gaseous distention of the small bowel. Correlate for clinical signs or symptoms of constipation. Electronically signed by: Waddell Calk MD 09/29/2024 04:41 PM EDT RP Workstation: [id number]    Procedures Procedures (including critical care time)  Medications Ordered in UC Medications - No data to display  Initial Impression / Assessment and Plan / UC Course  I have reviewed the triage vital signs and the nursing notes.  Pertinent labs & imaging results that were available during my care of the patient were reviewed by me and considered in my medical decision making (see chart for details).     I reviewed exam and symptoms with patient.  X-ray interpretation shows no acute rib fractures but does mention chronic right anterior rib fractures, severe.  I did reach out to radiology however Dr. Calk who interpreted this x-ray had already left for the day but I did speak with Dr.Poff and he did look at the  images.  He states chronic mild rib fracture on ribs 7 and 8.  I reviewed this with patient and he does state he thinks he may have broken a rib many many years ago.  Discussed likely musculoskeletal cause of pain.  Limited treatment options given his current medications, no NSAIDs.  No steroids as this causes him anxiety and depression.  Will do Lidoderm  patch and low-dose Flexeril nightly only.  Side effect profile was reviewed.  Advised heat to the area rest and PCP follow-up in 2 to 3 days for recheck.  Strict ER precautions reviewed and patient verbalized understanding Final Clinical Impressions(s) / UC Diagnoses   Final diagnoses:  Acute mid back pain  Muscle strain of upper back     Discharge Instructions      You may do the Lidoderm  patch to the back as needed.  Leave this on for 12 hours and remove for 12 hours.  May take over-the-counter Tylenol  as needed.  You may use Flexeril at night before bed only.  This medication will make you drowsy.  Do not drink alcohol  or drive while on this medication.  Heat to the back as needed.  Please follow-up with your PCP in 2 to 3 days for recheck.  Please go to the ER for any worsening symptoms.  I hope you feel better soon!     ED Prescriptions     Medication Sig Dispense Auth. Provider   lidocaine  (LIDODERM ) 5 % Place 1 patch onto the skin daily. Leave on the affected area for 12 hours and remove for 12 hours 30 patch Ramiya Delahunty, Jodi R, NP   cyclobenzaprine (FLEXERIL) 5 MG tablet Take 1 tablet (5 mg total) by mouth at bedtime as needed for muscle spasms. 5 tablet Shellie Rogoff, Jodi R, NP      PDMP not reviewed this encounter.   Loreda Myla SAUNDERS, NP 09/29/24 [phone]

## 2024-09-29 NOTE — ED Triage Notes (Signed)
 Patient states that he was eating out 3 days ago, and was unable to get up from his seat and another person tried to help lift him and flet pain in his mid back.

## 2024-10-05 DIAGNOSIS — R0602 Shortness of breath: Secondary | ICD-10-CM | POA: Diagnosis not present

## 2024-10-05 DIAGNOSIS — I4821 Permanent atrial fibrillation: Secondary | ICD-10-CM | POA: Diagnosis not present

## 2024-10-05 DIAGNOSIS — R0789 Other chest pain: Secondary | ICD-10-CM | POA: Diagnosis not present

## 2024-10-05 DIAGNOSIS — J8410 Pulmonary fibrosis, unspecified: Secondary | ICD-10-CM | POA: Diagnosis not present

## 2024-10-09 DIAGNOSIS — E538 Deficiency of other specified B group vitamins: Secondary | ICD-10-CM | POA: Diagnosis not present

## 2024-10-09 DIAGNOSIS — Z9981 Dependence on supplemental oxygen: Secondary | ICD-10-CM | POA: Diagnosis not present

## 2024-10-09 DIAGNOSIS — Z79899 Other long term (current) drug therapy: Secondary | ICD-10-CM | POA: Diagnosis not present

## 2024-10-09 DIAGNOSIS — I5030 Unspecified diastolic (congestive) heart failure: Secondary | ICD-10-CM | POA: Diagnosis not present

## 2024-10-09 DIAGNOSIS — R5381 Other malaise: Secondary | ICD-10-CM | POA: Diagnosis not present

## 2024-10-09 DIAGNOSIS — R531 Weakness: Secondary | ICD-10-CM | POA: Diagnosis not present

## 2024-10-12 ENCOUNTER — Inpatient Hospital Stay
Admission: EM | Admit: 2024-10-12 | Discharge: 2024-10-19 | DRG: 315 | Disposition: A | Discharge status: Skilled Nursing Facility | Attending: Internal Medicine | Admitting: Internal Medicine

## 2024-10-12 DIAGNOSIS — M1990 Unspecified osteoarthritis, unspecified site: Secondary | ICD-10-CM | POA: Diagnosis present

## 2024-10-12 DIAGNOSIS — E8770 Fluid overload, unspecified: Secondary | ICD-10-CM | POA: Diagnosis present

## 2024-10-12 DIAGNOSIS — Z79890 Hormone replacement therapy (postmenopausal): Secondary | ICD-10-CM

## 2024-10-12 DIAGNOSIS — R0602 Shortness of breath: Principal | ICD-10-CM

## 2024-10-12 DIAGNOSIS — E1151 Type 2 diabetes mellitus with diabetic peripheral angiopathy without gangrene: Secondary | ICD-10-CM | POA: Diagnosis present

## 2024-10-12 DIAGNOSIS — Z8349 Family history of other endocrine, nutritional and metabolic diseases: Secondary | ICD-10-CM

## 2024-10-12 DIAGNOSIS — I4821 Permanent atrial fibrillation: Secondary | ICD-10-CM | POA: Diagnosis present

## 2024-10-12 DIAGNOSIS — I10 Essential (primary) hypertension: Secondary | ICD-10-CM | POA: Diagnosis present

## 2024-10-12 DIAGNOSIS — I2489 Other forms of acute ischemic heart disease: Secondary | ICD-10-CM | POA: Diagnosis present

## 2024-10-12 DIAGNOSIS — M7989 Other specified soft tissue disorders: Secondary | ICD-10-CM | POA: Diagnosis not present

## 2024-10-12 DIAGNOSIS — Z6830 Body mass index (BMI) 30.0-30.9, adult: Secondary | ICD-10-CM

## 2024-10-12 DIAGNOSIS — Z888 Allergy status to other drugs, medicaments and biological substances status: Secondary | ICD-10-CM

## 2024-10-12 DIAGNOSIS — R600 Localized edema: Secondary | ICD-10-CM | POA: Diagnosis not present

## 2024-10-12 DIAGNOSIS — Z7901 Long term (current) use of anticoagulants: Secondary | ICD-10-CM

## 2024-10-12 DIAGNOSIS — Z951 Presence of aortocoronary bypass graft: Secondary | ICD-10-CM

## 2024-10-12 DIAGNOSIS — Z7951 Long term (current) use of inhaled steroids: Secondary | ICD-10-CM

## 2024-10-12 DIAGNOSIS — J84112 Idiopathic pulmonary fibrosis: Secondary | ICD-10-CM | POA: Diagnosis present

## 2024-10-12 DIAGNOSIS — E669 Obesity, unspecified: Secondary | ICD-10-CM | POA: Diagnosis present

## 2024-10-12 DIAGNOSIS — E876 Hypokalemia: Secondary | ICD-10-CM | POA: Diagnosis present

## 2024-10-12 DIAGNOSIS — J9611 Chronic respiratory failure with hypoxia: Secondary | ICD-10-CM | POA: Diagnosis present

## 2024-10-12 DIAGNOSIS — L03115 Cellulitis of right lower limb: Secondary | ICD-10-CM | POA: Diagnosis present

## 2024-10-12 DIAGNOSIS — J449 Chronic obstructive pulmonary disease, unspecified: Secondary | ICD-10-CM | POA: Diagnosis present

## 2024-10-12 DIAGNOSIS — J8410 Pulmonary fibrosis, unspecified: Secondary | ICD-10-CM | POA: Diagnosis present

## 2024-10-12 DIAGNOSIS — Z87891 Personal history of nicotine dependence: Secondary | ICD-10-CM

## 2024-10-12 DIAGNOSIS — I2510 Atherosclerotic heart disease of native coronary artery without angina pectoris: Secondary | ICD-10-CM | POA: Diagnosis present

## 2024-10-12 DIAGNOSIS — E118 Type 2 diabetes mellitus with unspecified complications: Secondary | ICD-10-CM | POA: Diagnosis present

## 2024-10-12 DIAGNOSIS — R0600 Dyspnea, unspecified: Secondary | ICD-10-CM | POA: Diagnosis present

## 2024-10-12 DIAGNOSIS — I878 Other specified disorders of veins: Secondary | ICD-10-CM | POA: Diagnosis present

## 2024-10-12 DIAGNOSIS — R0609 Other forms of dyspnea: Secondary | ICD-10-CM | POA: Diagnosis present

## 2024-10-12 DIAGNOSIS — I25118 Atherosclerotic heart disease of native coronary artery with other forms of angina pectoris: Secondary | ICD-10-CM | POA: Diagnosis not present

## 2024-10-12 DIAGNOSIS — Z79631 Long term (current) use of antimetabolite agent: Secondary | ICD-10-CM

## 2024-10-12 DIAGNOSIS — Z8616 Personal history of COVID-19: Secondary | ICD-10-CM

## 2024-10-12 DIAGNOSIS — Z7952 Long term (current) use of systemic steroids: Secondary | ICD-10-CM

## 2024-10-12 DIAGNOSIS — Z961 Presence of intraocular lens: Secondary | ICD-10-CM | POA: Diagnosis present

## 2024-10-12 DIAGNOSIS — Z79899 Other long term (current) drug therapy: Secondary | ICD-10-CM

## 2024-10-12 DIAGNOSIS — Z91013 Allergy to seafood: Secondary | ICD-10-CM

## 2024-10-12 DIAGNOSIS — G4733 Obstructive sleep apnea (adult) (pediatric): Secondary | ICD-10-CM | POA: Diagnosis present

## 2024-10-12 DIAGNOSIS — Z9841 Cataract extraction status, right eye: Secondary | ICD-10-CM

## 2024-10-12 DIAGNOSIS — Z7985 Long-term (current) use of injectable non-insulin antidiabetic drugs: Secondary | ICD-10-CM

## 2024-10-12 DIAGNOSIS — L03116 Cellulitis of left lower limb: Secondary | ICD-10-CM | POA: Diagnosis present

## 2024-10-12 DIAGNOSIS — Z885 Allergy status to narcotic agent status: Secondary | ICD-10-CM

## 2024-10-12 DIAGNOSIS — I272 Pulmonary hypertension, unspecified: Principal | ICD-10-CM | POA: Diagnosis present

## 2024-10-12 DIAGNOSIS — Z1152 Encounter for screening for COVID-19: Secondary | ICD-10-CM

## 2024-10-12 LAB — CBC WITH DIFFERENTIAL/PLATELET
Abs Immature Granulocytes: 0.04 K/uL (ref 0.00–0.07)
Basophils Absolute: 0 K/uL (ref 0.0–0.1)
Basophils Relative: 1 %
Eosinophils Absolute: 0.2 K/uL (ref 0.0–0.5)
Eosinophils Relative: 3 %
HCT: 41.4 % (ref 39.0–52.0)
Hemoglobin: 13.2 g/dL (ref 13.0–17.0)
Immature Granulocytes: 1 %
Lymphocytes Relative: 17 %
Lymphs Abs: 1.1 K/uL (ref 0.7–4.0)
MCH: 33.2 pg (ref 26.0–34.0)
MCHC: 31.9 g/dL (ref 30.0–36.0)
MCV: 104 fL — ABNORMAL HIGH (ref 80.0–100.0)
Monocytes Absolute: 0.3 K/uL (ref 0.1–1.0)
Monocytes Relative: 4 %
Neutro Abs: 4.6 K/uL (ref 1.7–7.7)
Neutrophils Relative %: 74 %
Platelets: 196 K/uL (ref 150–400)
RBC: 3.98 MIL/uL — ABNORMAL LOW (ref 4.22–5.81)
RDW: 19 % — ABNORMAL HIGH (ref 11.5–15.5)
WBC: 6.2 K/uL (ref 4.0–10.5)
nRBC: 0 % (ref 0.0–0.2)

## 2024-10-12 LAB — COMPREHENSIVE METABOLIC PANEL WITH GFR
ALT: 30 U/L (ref 0–44)
AST: 42 U/L — ABNORMAL HIGH (ref 15–41)
Albumin: 3.4 g/dL — ABNORMAL LOW (ref 3.5–5.0)
Alkaline Phosphatase: 91 U/L (ref 38–126)
Anion gap: 12 (ref 5–15)
BUN: 33 mg/dL — ABNORMAL HIGH (ref 8–23)
CO2: 26 mmol/L (ref 22–32)
Calcium: 9 mg/dL (ref 8.9–10.3)
Chloride: 108 mmol/L (ref 98–111)
Creatinine, Ser: 1 mg/dL (ref 0.61–1.24)
GFR, Estimated: >60 mL/min (ref >60)
Glucose, Bld: 103 mg/dL — ABNORMAL HIGH (ref 70–99)
Potassium: 4.5 mmol/L (ref 3.5–5.1)
Sodium: 146 mmol/L — ABNORMAL HIGH (ref 135–145)
Total Bilirubin: 1.2 mg/dL (ref 0.0–1.2)
Total Protein: 6.3 g/dL — ABNORMAL LOW (ref 6.5–8.1)

## 2024-10-12 LAB — TROPONIN I (HIGH SENSITIVITY): Troponin I (High Sensitivity): 56 ng/L — ABNORMAL HIGH (ref <18)

## 2024-10-12 LAB — BRAIN NATRIURETIC PEPTIDE: B Natriuretic Peptide: 306.7 pg/mL — ABNORMAL HIGH (ref 0.0–100.0)

## 2024-10-12 NOTE — ED Triage Notes (Signed)
 Pt arrived to ED d/t lower extremity swelling and redness beginning x1 week ago and has gradually been getting worse. Pt is not c/o any changes in breathing or fevers. Pt is on 5L O2 chronically d/t pulmonary fibrosis. Ambulatory in triage.

## 2024-10-13 ENCOUNTER — Emergency Department

## 2024-10-13 ENCOUNTER — Observation Stay
Admit: 2024-10-13 | Discharge: 2024-10-13 | Disposition: A | Discharge status: Home / Self Care | Attending: Internal Medicine | Admitting: Internal Medicine

## 2024-10-13 DIAGNOSIS — E118 Type 2 diabetes mellitus with unspecified complications: Secondary | ICD-10-CM | POA: Diagnosis not present

## 2024-10-13 DIAGNOSIS — R2689 Other abnormalities of gait and mobility: Secondary | ICD-10-CM | POA: Diagnosis not present

## 2024-10-13 DIAGNOSIS — Z7901 Long term (current) use of anticoagulants: Secondary | ICD-10-CM | POA: Diagnosis not present

## 2024-10-13 DIAGNOSIS — E669 Obesity, unspecified: Secondary | ICD-10-CM | POA: Diagnosis not present

## 2024-10-13 DIAGNOSIS — E039 Hypothyroidism, unspecified: Secondary | ICD-10-CM | POA: Diagnosis not present

## 2024-10-13 DIAGNOSIS — T782XXD Anaphylactic shock, unspecified, subsequent encounter: Secondary | ICD-10-CM | POA: Diagnosis not present

## 2024-10-13 DIAGNOSIS — Z6830 Body mass index (BMI) 30.0-30.9, adult: Secondary | ICD-10-CM | POA: Diagnosis not present

## 2024-10-13 DIAGNOSIS — R0602 Shortness of breath: Secondary | ICD-10-CM | POA: Diagnosis not present

## 2024-10-13 DIAGNOSIS — Z951 Presence of aortocoronary bypass graft: Secondary | ICD-10-CM | POA: Diagnosis not present

## 2024-10-13 DIAGNOSIS — I872 Venous insufficiency (chronic) (peripheral): Secondary | ICD-10-CM | POA: Diagnosis not present

## 2024-10-13 DIAGNOSIS — J449 Chronic obstructive pulmonary disease, unspecified: Secondary | ICD-10-CM | POA: Diagnosis not present

## 2024-10-13 DIAGNOSIS — Z87891 Personal history of nicotine dependence: Secondary | ICD-10-CM | POA: Diagnosis not present

## 2024-10-13 DIAGNOSIS — F329 Major depressive disorder, single episode, unspecified: Secondary | ICD-10-CM | POA: Diagnosis not present

## 2024-10-13 DIAGNOSIS — D649 Anemia, unspecified: Secondary | ICD-10-CM | POA: Diagnosis not present

## 2024-10-13 DIAGNOSIS — J90 Pleural effusion, not elsewhere classified: Secondary | ICD-10-CM | POA: Diagnosis not present

## 2024-10-13 DIAGNOSIS — E1151 Type 2 diabetes mellitus with diabetic peripheral angiopathy without gangrene: Secondary | ICD-10-CM | POA: Diagnosis not present

## 2024-10-13 DIAGNOSIS — E8770 Fluid overload, unspecified: Secondary | ICD-10-CM | POA: Diagnosis not present

## 2024-10-13 DIAGNOSIS — Z1152 Encounter for screening for COVID-19: Secondary | ICD-10-CM | POA: Diagnosis not present

## 2024-10-13 DIAGNOSIS — Z7951 Long term (current) use of inhaled steroids: Secondary | ICD-10-CM | POA: Diagnosis not present

## 2024-10-13 DIAGNOSIS — R0609 Other forms of dyspnea: Secondary | ICD-10-CM | POA: Diagnosis not present

## 2024-10-13 DIAGNOSIS — Z743 Need for continuous supervision: Secondary | ICD-10-CM | POA: Diagnosis not present

## 2024-10-13 DIAGNOSIS — M48061 Spinal stenosis, lumbar region without neurogenic claudication: Secondary | ICD-10-CM | POA: Diagnosis not present

## 2024-10-13 DIAGNOSIS — E569 Vitamin deficiency, unspecified: Secondary | ICD-10-CM | POA: Diagnosis not present

## 2024-10-13 DIAGNOSIS — R41841 Cognitive communication deficit: Secondary | ICD-10-CM | POA: Diagnosis not present

## 2024-10-13 DIAGNOSIS — M7989 Other specified soft tissue disorders: Secondary | ICD-10-CM | POA: Diagnosis not present

## 2024-10-13 DIAGNOSIS — Z79890 Hormone replacement therapy (postmenopausal): Secondary | ICD-10-CM | POA: Diagnosis not present

## 2024-10-13 DIAGNOSIS — I2489 Other forms of acute ischemic heart disease: Secondary | ICD-10-CM | POA: Diagnosis not present

## 2024-10-13 DIAGNOSIS — J9611 Chronic respiratory failure with hypoxia: Secondary | ICD-10-CM | POA: Diagnosis not present

## 2024-10-13 DIAGNOSIS — J8410 Pulmonary fibrosis, unspecified: Secondary | ICD-10-CM | POA: Diagnosis not present

## 2024-10-13 DIAGNOSIS — G4733 Obstructive sleep apnea (adult) (pediatric): Secondary | ICD-10-CM | POA: Diagnosis not present

## 2024-10-13 DIAGNOSIS — I4821 Permanent atrial fibrillation: Secondary | ICD-10-CM | POA: Diagnosis not present

## 2024-10-13 DIAGNOSIS — E876 Hypokalemia: Secondary | ICD-10-CM | POA: Diagnosis not present

## 2024-10-13 DIAGNOSIS — R609 Edema, unspecified: Secondary | ICD-10-CM | POA: Diagnosis not present

## 2024-10-13 DIAGNOSIS — I878 Other specified disorders of veins: Secondary | ICD-10-CM | POA: Diagnosis not present

## 2024-10-13 DIAGNOSIS — Z7985 Long-term (current) use of injectable non-insulin antidiabetic drugs: Secondary | ICD-10-CM | POA: Diagnosis not present

## 2024-10-13 DIAGNOSIS — R0600 Dyspnea, unspecified: Secondary | ICD-10-CM | POA: Diagnosis not present

## 2024-10-13 DIAGNOSIS — I25118 Atherosclerotic heart disease of native coronary artery with other forms of angina pectoris: Secondary | ICD-10-CM | POA: Diagnosis not present

## 2024-10-13 DIAGNOSIS — Z8616 Personal history of COVID-19: Secondary | ICD-10-CM | POA: Diagnosis not present

## 2024-10-13 DIAGNOSIS — J84112 Idiopathic pulmonary fibrosis: Secondary | ICD-10-CM | POA: Diagnosis not present

## 2024-10-13 DIAGNOSIS — I25710 Atherosclerosis of autologous vein coronary artery bypass graft(s) with unstable angina pectoris: Secondary | ICD-10-CM | POA: Diagnosis not present

## 2024-10-13 DIAGNOSIS — I2510 Atherosclerotic heart disease of native coronary artery without angina pectoris: Secondary | ICD-10-CM | POA: Diagnosis not present

## 2024-10-13 DIAGNOSIS — R918 Other nonspecific abnormal finding of lung field: Secondary | ICD-10-CM | POA: Diagnosis not present

## 2024-10-13 DIAGNOSIS — M109 Gout, unspecified: Secondary | ICD-10-CM | POA: Diagnosis not present

## 2024-10-13 DIAGNOSIS — L03119 Cellulitis of unspecified part of limb: Secondary | ICD-10-CM | POA: Diagnosis not present

## 2024-10-13 DIAGNOSIS — R1311 Dysphagia, oral phase: Secondary | ICD-10-CM | POA: Diagnosis not present

## 2024-10-13 DIAGNOSIS — L03115 Cellulitis of right lower limb: Secondary | ICD-10-CM | POA: Diagnosis not present

## 2024-10-13 DIAGNOSIS — L03116 Cellulitis of left lower limb: Secondary | ICD-10-CM | POA: Diagnosis not present

## 2024-10-13 DIAGNOSIS — E1169 Type 2 diabetes mellitus with other specified complication: Secondary | ICD-10-CM | POA: Diagnosis not present

## 2024-10-13 DIAGNOSIS — I4891 Unspecified atrial fibrillation: Secondary | ICD-10-CM | POA: Diagnosis not present

## 2024-10-13 DIAGNOSIS — M6281 Muscle weakness (generalized): Secondary | ICD-10-CM | POA: Diagnosis not present

## 2024-10-13 DIAGNOSIS — I272 Pulmonary hypertension, unspecified: Secondary | ICD-10-CM | POA: Diagnosis not present

## 2024-10-13 DIAGNOSIS — I10 Essential (primary) hypertension: Secondary | ICD-10-CM | POA: Diagnosis not present

## 2024-10-13 LAB — RESP PANEL BY RT-PCR (RSV, FLU A&B, COVID)  RVPGX2
Influenza A by PCR: NEGATIVE
Influenza B by PCR: NEGATIVE
Resp Syncytial Virus by PCR: NEGATIVE
SARS Coronavirus 2 by RT PCR: NEGATIVE

## 2024-10-13 MED ORDER — SODIUM CHLORIDE 0.9 % IV SOLN
1.0000 g | INTRAVENOUS | Status: DC
  Administered 2024-10-13 – 2024-10-16 (×4): 1 g via INTRAVENOUS
  Filled 2024-10-13 (×4): qty 10

## 2024-10-13 MED ORDER — ONDANSETRON HCL 4 MG PO TABS: 4.0000 mg | ORAL_TABLET | Freq: Four times a day (QID) | ORAL | Status: DC | PRN

## 2024-10-13 MED ORDER — FUROSEMIDE 10 MG/ML IJ SOLN
40.0000 mg | Freq: Once | INTRAMUSCULAR | Status: AC
Start: 2024-10-13 — End: 2024-10-13
  Administered 2024-10-13: 40 mg via INTRAVENOUS
  Filled 2024-10-13: qty 4

## 2024-10-13 MED ORDER — LEVOTHYROXINE SODIUM 25 MCG PO TABS
25.0000 ug | ORAL_TABLET | Freq: Every day | ORAL | Status: DC
  Administered 2024-10-13 – 2024-10-19 (×7): 25 ug via ORAL
  Filled 2024-10-13 (×7): qty 1

## 2024-10-13 MED ORDER — DULOXETINE HCL 30 MG PO CPEP
60.0000 mg | ORAL_CAPSULE | Freq: Every day | ORAL | Status: DC
Start: 2024-10-13 — End: 2024-10-19
  Administered 2024-10-13 – 2024-10-19 (×7): 60 mg via ORAL
  Filled 2024-10-13 (×6): qty 2
  Filled 2024-10-13: qty 1

## 2024-10-13 MED ORDER — APIXABAN 5 MG PO TABS
5.0000 mg | ORAL_TABLET | Freq: Two times a day (BID) | ORAL | Status: DC
  Administered 2024-10-13 – 2024-10-19 (×14): 5 mg via ORAL
  Filled 2024-10-13 (×14): qty 1

## 2024-10-13 MED ORDER — ALBUTEROL SULFATE (2.5 MG/3ML) 0.083% IN NEBU: 2.5000 mg | INHALATION_SOLUTION | RESPIRATORY_TRACT | Status: DC | PRN

## 2024-10-13 MED ORDER — OXYCODONE HCL 5 MG PO TABS
5.0000 mg | ORAL_TABLET | ORAL | Status: DC | PRN
  Administered 2024-10-18: 5 mg via ORAL
  Filled 2024-10-13 (×2): qty 1

## 2024-10-13 MED ORDER — FUROSEMIDE 10 MG/ML IJ SOLN
40.0000 mg | Freq: Two times a day (BID) | INTRAMUSCULAR | Status: DC
  Administered 2024-10-13 – 2024-10-16 (×7): 40 mg via INTRAVENOUS
  Filled 2024-10-13 (×7): qty 4

## 2024-10-13 MED ORDER — ACETAMINOPHEN 650 MG RE SUPP
650.0000 mg | Freq: Four times a day (QID) | RECTAL | Status: DC | PRN
Start: 2024-10-13 — End: 2024-10-19

## 2024-10-13 MED ORDER — IPRATROPIUM-ALBUTEROL 0.5-2.5 (3) MG/3ML IN SOLN
3.0000 mL | Freq: Once | RESPIRATORY_TRACT | Status: AC
  Administered 2024-10-13: 3 mL via RESPIRATORY_TRACT
  Filled 2024-10-13: qty 3

## 2024-10-13 MED ORDER — PERFLUTREN LIPID MICROSPHERE
1.0000 mL | INTRAVENOUS | Status: AC | PRN
  Administered 2024-10-13: 2 mL via INTRAVENOUS

## 2024-10-13 MED ORDER — IPRATROPIUM-ALBUTEROL 0.5-2.5 (3) MG/3ML IN SOLN: 3.0000 mL | Freq: Four times a day (QID) | RESPIRATORY_TRACT | Status: DC | PRN

## 2024-10-13 MED ORDER — ONDANSETRON HCL 4 MG/2ML IJ SOLN: 4.0000 mg | Freq: Four times a day (QID) | INTRAMUSCULAR | Status: DC | PRN

## 2024-10-13 MED ORDER — ACETAMINOPHEN 325 MG PO TABS
650.0000 mg | ORAL_TABLET | Freq: Four times a day (QID) | ORAL | Status: DC | PRN
  Administered 2024-10-13 – 2024-10-17 (×9): 650 mg via ORAL
  Filled 2024-10-13 (×9): qty 2

## 2024-10-13 MED ORDER — ATORVASTATIN CALCIUM 20 MG PO TABS
40.0000 mg | ORAL_TABLET | Freq: Every day | ORAL | Status: DC
  Administered 2024-10-13 – 2024-10-19 (×7): 40 mg via ORAL
  Filled 2024-10-13 (×7): qty 2

## 2024-10-13 NOTE — Assessment & Plan Note (Addendum)
 Elevated troponin Troponin elevation likely related to demand ischemia.   EKG nonacute Patient does report atypical chest pain-lower right rib pain related to a possible trauma, since resolved Last evaluated by cardiology a month ago Continue Ranexa, metoprolol , atorvastatin  and apixaban Can consider cardiology consult

## 2024-10-13 NOTE — Assessment & Plan Note (Addendum)
 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

## 2024-10-13 NOTE — H&P (Addendum)
 O History and Physical    Patient: [name]. FMW:[id number] DOB: 1941-09-05 DOA: 10/12/2024 DOS: the patient was seen and examined on 10/13/2024 PCP: Fernande Ophelia JINNY DOUGLAS, MD  Patient coming from: Home  Chief Complaint:  Chief Complaint  Patient presents with   Leg Swelling    HPI: [name]. is a 83 y.o. 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  Patient presented initially for worsening of his chronic lower extremity edema and a several day history of redness including a red rash on the dorsum of both feet.  He also noted weeping of the feet due to the increased swelling.  He tried taking an extra dose of torsemide over the past few days without improvement.  He does not have a history of CHF and most recently saw his cardiologist on 09/21/2024 when his symptoms appeared attributable to his pulmonary hypertension.  He denies chest pain or cough beyond baseline or fever or chills. Additionally, patient mentions that he was recently having right lower anterior chest pain/rib cage pain which started after someone tried to helping out of a chair from behind and might have held onto his chest too tightly.  It has since improved.  In the ED BP 143/89 with otherwise normal vitals.  O2 sat 94% on oxygen  at home flow rate. Labs notable for troponin 56 and BNP 306 CBC unremarkable CMP with minor abnormalities but for the most part unremarkable  EKG showed A-fib at 96 Chest x-ray showed cardiomegaly with chronic interstitial lung disease  and small pleural effusion-please see if report  Patient was treated with IV Lasix 40 mg and given a dose of DuoNeb as a trial  Admission requested      Past Medical History:  Diagnosis Date   Allergy    Arthritis    Coronary artery disease    COVID-19 11/19/2019   Dysrhythmia    Hypertension    Peripheral vascular disease    Pulmonary fibrosis (HCC)    Sleep apnea    CPAP   Tremor    Past Surgical History:  Procedure Laterality Date   CARDIAC SURGERY     CARDIOVERSION N/A 03/03/2018   Procedure: CARDIOVERSION;  Surgeon: Hester Wolm JINNY, MD;  Location: [facility];  Service: Cardiovascular;  Laterality: N/A;   CARDIOVERSION N/A 05/24/2018   Procedure: CARDIOVERSION;  Surgeon: Hester Wolm JINNY, MD;  Location: [facility];  Service: Cardiovascular;  Laterality: N/A;   CATARACT EXTRACTION W/PHACO Right 06/03/2020   Procedure: CATARACT EXTRACTION PHACO AND INTRAOCULAR LENS PLACEMENT (IOC) RIGHT 6.07  00:48.0;  Surgeon: Myrna Adine Anes, MD;  Location: [facility];  Service: Ophthalmology;  Laterality: Right;  sleep apnea   CORONARY ARTERY BYPASS GRAFT     EYE SURGERY     LEG SURGERY Left    stents placed, Fem-Fem bypass   LOWER EXTREMITY ANGIOGRAPHY Left 09/01/2022   Procedure: Lower Extremity Angiography;  Surgeon: Jama Cordella MATSU, MD;  Location: [facility];  Service: Cardiovascular;  Laterality: Left;   RIGHT HEART CATH Right 07/24/2024   Procedure: RIGHT HEART CATH;  Surgeon: Florencio Cara BIRCH, MD;  Location: [facility];  Service: Cardiovascular;  Laterality: Right;   RIGHT/LEFT HEART CATH AND CORONARY ANGIOGRAPHY Bilateral 02/12/2022   Procedure: RIGHT/LEFT HEART CATH AND CORONARY ANGIOGRAPHY;  Surgeon: Hester Wolm PARAS, MD;  Location: [facility];  Service: Cardiovascular;  Laterality: Bilateral;   TONSILLECTOMY     Social History:  reports that he quit smoking about 45 years ago. His smoking use included cigarettes. He has never used  smokeless tobacco. He reports current alcohol  use. He reports that he does not use drugs.  Allergies  Allergen Reactions   Fish Allergy Hives   Spironolactone Other (See Comments)    gynecomastia   Hydrocodone       confusion   Tramadol  Other (See Comments)    confusion   Hydrochlorothiazide Other (See Comments)    worsens gout   Iodine  Rash   Shellfish Allergy Rash    Family History  Problem Relation Age of Onset   Thyroid  disease Mother    Macular degeneration Mother    Cancer Father     Prior to Admission medications   Medication Sig Start Date End Date Taking? Authorizing Provider  acetaminophen  (TYLENOL ) 650 MG CR tablet Take 1,300 mg by mouth every 8 (eight) hours as needed for pain.    Yes [provider]  apixaban (ELIQUIS) 5 MG TABS tablet Take 5 mg by mouth 2 (two) times daily. 12/02/23  Yes [provider]  dexamethasone  (DECADRON ) 2 MG tablet Take 2 mg by mouth daily. 09/22/24  Yes [provider]  DULoxetine (CYMBALTA) 60 MG capsule Take 60 mg by mouth daily. 09/07/24 09/07/25 Yes [provider]  fluticasone-salmeterol (ADVAIR) 250-50 MCG/ACT AEPB Inhale 1 puff into the lungs 2 (two) times daily as needed (Asthma). 12/07/23  Yes [provider]  folic acid (FOLVITE) 1 MG tablet Take 1 mg by mouth daily. Do not take on Sunday 08/24/24 08/24/25 Yes [provider]  ipratropium-albuterol  (DUONEB) 0.5-2.5 (3) MG/3ML SOLN Inhale 3 mLs into the lungs every 6 (six) hours as needed (Wheezing/SOB). 05/23/24 05/18/25 Yes [provider]  levothyroxine  (SYNTHROID ) 25 MCG tablet Take 25 mcg by mouth every morning. 12/31/21  Yes [provider]  oxyCODONE -acetaminophen  (PERCOCET/ROXICET) 5-325 MG tablet Take 1 tablet by mouth every 8 (eight) hours as needed. 10/05/24  Yes [provider]  sulfamethoxazole-trimethoprim (BACTRIM) 400-80 MG tablet Take 1 tablet by mouth 3 (three) times a week. Monday,  Wednesday, Friday 09/08/24  Yes [provider]  tirzepatide (MOUNJARO) 2.5 MG/0.5ML Pen Inject 2.5 mg into the skin once a week.   Yes [provider]  allopurinol (ZYLOPRIM) 100 MG tablet Take 100 mg by mouth. 09/13/23   [provider]  ALPRAZolam (XANAX) 0.25 MG tablet Take 0.25 mg by mouth 3 (three) times daily as needed for anxiety. Patient not taking: Reported on 10/13/2024 05/05/18   [provider]  atorvastatin  (LIPITOR) 40 MG tablet Take 40 mg by mouth daily. 11/26/21   [provider]  cyclobenzaprine (FLEXERIL) 5 MG tablet Take 1 tablet (5 mg total) by mouth at bedtime as needed for muscle spasms. 09/29/24   Mayer, Jodi R, NP  diclofenac  Sodium (VOLTAREN ) 1 % GEL Apply 2 g topically 4 (four) times daily. 08/11/24   Bernardino Ditch, NP  diltiazem  (CARDIZEM  CD) 120 MG 24 hr capsule Take 120 mg by mouth 2 (two) times daily. 03/29/24   [provider]  EPINEPHrine  0.3 mg/0.3 mL IJ SOAJ injection Inject 0.3 mg into the muscle as  needed for anaphylaxis. 03/16/24   [provider]  escitalopram (LEXAPRO) 5 MG tablet Take 5 mg by mouth daily. 07/28/22   [provider]  ibuprofen (ADVIL) 200 MG tablet Take 400 mg by mouth every 6 (six) hours as needed for moderate pain (pain score 4-6).    [provider]  lidocaine  (LIDODERM ) 5 % Place 1 patch onto the skin daily. Leave on the affected area for 12 hours and remove for 12 hours 09/29/24   Mayer, Jodi R, NP  methotrexate (RHEUMATREX) 2.5 MG tablet Take 10 mg by mouth once a week.    [provider]  metoCLOPramide  (REGLAN ) 10 MG tablet Take 1 tablet (10 mg total) by mouth every 6 (six) hours. 08/05/24   Reddick, Johnathan B, NP  metoprolol  succinate (TOPROL -XL) 50 MG 24 hr tablet Take 50 mg by mouth daily. 09/07/18   [provider]  ranolazine (RANEXA) 500 MG 12 hr tablet Take 500 mg by mouth 2 (two) times daily. 07/12/24 07/12/25  [provider]   torsemide (DEMADEX) 20 MG tablet Take 20 mg by mouth daily. 07/16/22   [provider]  Treprostinil Sodium (YUTREPIA) 26.5 MCG CAPS Take 26.5 mcg by mouth in the morning, at noon, in the evening, and at bedtime. Patient not taking: Reported on 08/17/2024 07/04/24   [provider]    Physical Exam: Vitals:   10/12/24 1910 10/12/24 1912  BP: (!) 143/89   Pulse: 95   Resp: 20   Temp: 97.9 F (36.6 C)   TempSrc: Oral   SpO2: 94%   Weight:  104.3 kg  Height:  5' 11 (1.803 m)   Physical Exam Vitals and nursing note reviewed.  Constitutional:      General: He is not in acute distress. HENT:     Head: Normocephalic and atraumatic.  Cardiovascular:     Rate and Rhythm: Normal rate and regular rhythm.     Heart sounds: Normal heart sounds.  Pulmonary:     Effort: Pulmonary effort is normal.     Breath sounds: Normal breath sounds.  Abdominal:     Palpations: Abdomen is soft.     Tenderness: There is no abdominal tenderness.  Musculoskeletal:     Right lower leg: Edema present.     Left lower leg: Edema present.     Comments: See pic  Neurological:     Mental Status: Mental status is at baseline.     Labs on Admission: I have personally reviewed following labs and imaging studies  CBC: Recent Labs  Lab 10/12/24 1924  WBC 6.2  NEUTROABS 4.6  HGB 13.2  HCT 41.4  MCV 104.0*  PLT 196   Basic Metabolic Panel: Recent Labs  Lab 10/12/24 1924  NA 146*  K 4.5  CL 108  CO2 26  GLUCOSE 103*  BUN 33*  CREATININE 1.00  CALCIUM  9.0   GFR: Estimated Creatinine Clearance: 68.8 mL/min (by C-G formula based on SCr of 1 mg/dL). Liver Function Tests: Recent Labs  Lab 10/12/24 1924  AST 42*  ALT 30  ALKPHOS 91  BILITOT 1.2  PROT 6.3*  ALBUMIN 3.4*   No results for input(s): LIPASE, AMYLASE in the last 168 hours. No results for input(s): AMMONIA in the last 168 hours. Coagulation Profile: No results for input(s): INR, PROTIME in the  last 168 hours. Cardiac Enzymes: No results for input(s): CKTOTAL, CKMB, CKMBINDEX, TROPONINI in the last 168 hours. BNP (last 3 results) No results for input(s): PROBNP  in the last 8760 hours. HbA1C: No results for input(s): HGBA1C in the last 72 hours. CBG: No results for input(s): GLUCAP in the last 168 hours. Lipid Profile: No results for input(s): CHOL, HDL, LDLCALC, TRIG, CHOLHDL, LDLDIRECT in the last 72 hours. Thyroid  Function Tests: No results for input(s): TSH, T4TOTAL, FREET4, T3FREE, THYROIDAB in the last 72 hours. Anemia Panel: No results for input(s): VITAMINB12, FOLATE, FERRITIN, TIBC, IRON, RETICCTPCT in the last 72 hours. Urine analysis: No results found for: COLORURINE, APPEARANCEUR, LABSPEC, PHURINE, GLUCOSEU, HGBUR, BILIRUBINUR, KETONESUR, PROTEINUR, UROBILINOGEN, NITRITE, LEUKOCYTESUR  Radiological Exams on Admission: DG Chest 1 View Result Date: 10/13/2024 CLINICAL DATA:  Lower extremity swelling and redness. EXAM: CHEST  1 VIEW COMPARISON:  September 29, 2024 FINDINGS: Multiple sternal wires and vascular clips are noted. The cardiac silhouette is mildly enlarged and unchanged in size. There is marked severity calcification of the aortic arch. Diffuse bilateral reticular interstitial opacities and peripheral fibrotic changes are seen. Mild to moderate severity superimposed atelectasis and/or infiltrate is noted within the bilateral lower lobes. There is a small right pleural effusion. No pneumothorax is identified. No acute osseous abnormality is identified. IMPRESSION: 1. Cardiomegaly with chronic interstitial lung disease and superimposed bilateral lower lobe atelectasis and/or infiltrate. 2. Small right pleural effusion. Electronically Signed   By: Suzen Dials M.D.   On: 10/13/2024 01:00   Data Reviewed for HPI: Relevant notes from primary care and specialist visits, past discharge summaries as  available in EHR, including Care Everywhere. Prior diagnostic testing as pertinent to current admission diagnoses Updated medications and problem lists for reconciliation ED course, including vitals, labs, imaging, treatment and response to treatment Triage notes, nursing and pharmacy notes and ED provider's notes Notable results as noted above in HPI      Assessment and Plan: * Dyspnea on exertion Possible acute right heart failure (echo 11/2023 with normal LV EF) Moderate pulmonary fibrosis with moderate pulmonary hypertension s/p RHC 07/2024 Suspect symptoms related to pulmonary hypertension Elevated BNP with small pleural effusion on chest x-ray IV Lasix Keep legs elevated Echocardiogram Daily weights with intake and output monitoring Can consider pulmonology versus cardiology consult   Bilateral leg edema-chronic venous stasis Possible cellulitis bilateral lower extremities Likely chronic venous stasis worsened by possible heart failure Keep legs elevated Rocephin Additional improvement expected with diuretic therapy  Chronic respiratory failure with hypoxia (HCC) OSA on CPAP Pulmonary fibrosis with pulmonary hypertension Patient is at baseline O2 requirement of 5 L Follows with pulmonology, Dr. Parris CPAP nightly Can consider pulmonology consult  CAD s/p CABG (coronary artery disease) Elevated troponin Troponin elevation likely related to demand ischemia.   EKG nonacute Patient does report atypical chest pain-lower right rib pain related to a possible trauma, since resolved Last evaluated by cardiology a month ago Continue Ranexa, metoprolol , atorvastatin  and apixaban Can consider cardiology consult  Permanent atrial fibrillation (HCC) Rate controlled Continue apixaban and metoprolol   Benign essential hypertension Continue home meds        DVT prophylaxis: Apixaban  Consults: none  Advance Care Planning:   Code Status: Prior   Family  Communication: Wife and daughter at bedside  Disposition Plan: Back to previous home environment  Severity of Illness: The appropriate patient status for this patient is OBSERVATION. Observation status is judged to be reasonable and necessary in order to provide the required intensity of service to ensure the patient's safety. The patient's presenting symptoms, physical exam findings, and initial radiographic and laboratory data in the context of their medical  condition is felt to place them at decreased risk for further clinical deterioration. Furthermore, it is anticipated that the patient will be medically stable for discharge from the hospital within 2 midnights of admission.   Author: Delayne LULLA Solian, MD 10/13/2024 1:43 AM  For on call review [email or web address].

## 2024-10-13 NOTE — Hospital Course (Addendum)
 [facility]

## 2024-10-13 NOTE — Assessment & Plan Note (Addendum)
 OSA on CPAP Pulmonary fibrosis with pulmonary hypertension Patient is at baseline O2 requirement of 5 L Follows with pulmonology, Dr. Parris CPAP nightly Can consider pulmonology consult

## 2024-10-13 NOTE — Assessment & Plan Note (Signed)
-   Continue home meds

## 2024-10-13 NOTE — ED Notes (Signed)
 Meal tray delivered.

## 2024-10-13 NOTE — ED Provider Notes (Signed)
 [facility] Bernardino Provider Note    Event Date/Time   First MD Initiated Contact with Patient 10/12/24 2347     (approximate)   History   Leg Swelling   HPI  Bronc Brosseau. is a 83 y.o. male history of atrial fibrillation on Eliquis, history of CAD, COPD on 5 L nasal cannula at baseline, presenting with leg swelling.  States this started a week ago.  Has progressed.  States that he also noticed some redness to his bilateral lower extremities.  States that his left lower extremity tends to be larger than the right and that is chronic for him.  Does note worsening shortness of breath especially with ambulation, states that he now walks very short distances and is out of breath.  Had his torsemide increased recently but does not feel that he has had increased urine output.  Denies any fever, does note some intermittent chest pain.  States that he was told 2 weeks ago that his chest pain that was thought to be a muscle strain versus possible irritation from a sternotomy wire.  Patient states that has been compliant with medication including his Eliquis.  Per independent history from daughter, he does have history of COPD, he is on 5 L at baseline.  On independent chart, he had a right heart cath done in August that shows moderate pulmonary hypertension.  Has had right DVT ultrasound done in 2023 that was negative for DVT.     Physical Exam   Triage Vital Signs: ED Triage Vitals  Encounter Vitals Group     BP 10/12/24 1910 (!) 143/89     Girls Systolic BP Percentile --      Girls Diastolic BP Percentile --      Boys Systolic BP Percentile --      Boys Diastolic BP Percentile --      Pulse Rate 10/12/24 1910 95     Resp 10/12/24 1910 20     Temp 10/12/24 1910 97.9 F (36.6 C)     Temp Source 10/12/24 1910 Oral     SpO2 10/12/24 1910 94 %     Weight 10/12/24 1912 230 lb (104.3 kg)     Height 10/12/24 1912 5' 11 (1.803 m)     Head Circumference --       Peak Flow --      Pain Score 10/12/24 1911 6     Pain Loc --      Pain Education --      Exclude from Growth Chart --     Most recent vital signs: Vitals:   10/12/24 1910  BP: (!) 143/89  Pulse: 95  Resp: 20  Temp: 97.9 F (36.6 C)  SpO2: 94%     General: Awake, no distress.  CV:  Good peripheral perfusion.  Resp:  Normal effort.  Mild wheezing bilaterally Abd:  No distention.  Soft nontender Other:  Bilateral lower extremity edema, left lower extremity appears slightly larger than the right, there is trace erythema, no induration or palpable fluctuance.  No purulent drainage.   ED Results / Procedures / Treatments   Labs (all labs ordered are listed, but only abnormal results are displayed) Labs Reviewed  CBC WITH DIFFERENTIAL/PLATELET - Abnormal; Notable for the following components:      Result Value   RBC 3.98 (*)    MCV 104.0 (*)    RDW 19.0 (*)    All other components within normal limits  COMPREHENSIVE METABOLIC  PANEL WITH GFR - Abnormal; Notable for the following components:   Sodium 146 (*)    Glucose, Bld 103 (*)    BUN 33 (*)    Total Protein 6.3 (*)    Albumin 3.4 (*)    AST 42 (*)    All other components within normal limits  BRAIN NATRIURETIC PEPTIDE - Abnormal; Notable for the following components:   B Natriuretic Peptide 306.7 (*)    All other components within normal limits  TROPONIN I (HIGH SENSITIVITY) - Abnormal; Notable for the following components:   Troponin I (High Sensitivity) 56 (*)    All other components within normal limits  RESP PANEL BY RT-PCR (RSV, FLU A&B, COVID)  [id number]     EKG  EKG shows, atrial fibrillation, rate 96, normal QRS, prolonged QTc, no obvious ischemic ST elevation, baseline is wandering, T wave flattening in 3, aVF, QT changes are new compared to prior   RADIOLOGY On my independent interpretation, chest x-ray shows right pleural effusion   PROCEDURES:  Critical Care performed:  No  Procedures   MEDICATIONS ORDERED IN ED: Medications  furosemide (LASIX) injection 40 mg (40 mg Intravenous Given 10/13/24 0050)  ipratropium-albuterol  (DUONEB) 0.5-2.5 (3) MG/3ML nebulizer solution 3 mL (3 mLs Nebulization Given 10/13/24 0031)     IMPRESSION / MDM / ASSESSMENT AND PLAN / ED COURSE  I reviewed the triage vital signs and the nursing notes.                              Differential diagnosis includes, but is not limited to, CHF exacerbation, lymphedema, did consider COPD exacerbation but patient just has mild wheezing on both sides, did consider pneumonia, viral illness but he does not report fever or cough, atypical ACS.  Did also consider DVT and PE but given that he is compliant with his Eliquis, states that his leg asymmetry is chronic for him, also with his volume overload on exam, suspect CHF is higher on the differential.  Will get labs, EKG, troponin, chest x-ray, IV Lasix.  DuoNeb.  He will need to be admitted for further management.  Patient's presentation is most consistent with acute presentation with potential threat to life or bodily function.  Independent interpretation of labs and imaging below.  Given his volume overload despite p.o. torsemide, he will need to be admitted for further management and IV Lasix.  Consulted hospitalist who will admit the patient.  He is admitted.  The patient is on the cardiac monitor to evaluate for evidence of arrhythmia and/or significant heart rate changes.   Clinical Course as of 10/13/24 0128  Fri Oct 13, 2024  0017 Independent review of labs, no leukocytosis, electrolytes not severely deranged, creatinine is normal, troponin and BNP are mildly elevated. [TT]  0112 DG Chest 1 View IMPRESSION: 1. Cardiomegaly with chronic interstitial lung disease and superimposed bilateral lower lobe atelectasis and/or infiltrate. 2. Small right pleural effusion.   [TT]    Clinical Course User Index [TT] Waymond, Lorelle Cummins, MD      FINAL CLINICAL IMPRESSION(S) / ED DIAGNOSES   Final diagnoses:  Peripheral edema  Shortness of breath  Hypervolemia, unspecified hypervolemia type     Rx / DC Orders   ED Discharge Orders     None        Note:  This document was prepared using Dragon voice recognition software and may include unintentional dictation errors.    Waymond Lorelle  Jerri, MD 10/13/24 9871

## 2024-10-13 NOTE — ED Provider Notes (Incomplete)
 [facility] Provider Note    Event Date/Time   First MD Initiated Contact with Patient 10/12/24 2347     (approximate)   History   Leg Swelling   HPI  [name]. is a 83 y.o. male  ***       Physical Exam   Triage Vital Signs: ED Triage Vitals  Encounter Vitals Group     BP 10/12/24 1910 (!) 143/89     Girls Systolic BP Percentile --      Girls Diastolic BP Percentile --      Boys Systolic BP Percentile --      Boys Diastolic BP Percentile --      Pulse Rate 10/12/24 1910 95     Resp 10/12/24 1910 20     Temp 10/12/24 1910 97.9 F (36.6 C)     Temp Source 10/12/24 1910 Oral     SpO2 10/12/24 1910 94 %     Weight 10/12/24 1912 230 lb (104.3 kg)     Height 10/12/24 1912 5' 11 (1.803 m)     Head Circumference --      Peak Flow --      Pain Score 10/12/24 1911 6     Pain Loc --      Pain Education --      Exclude from Growth Chart --     Most recent vital signs: Vitals:   10/12/24 1910  BP: (!) 143/89  Pulse: 95  Resp: 20  Temp: 97.9 F (36.6 C)  SpO2: 94%     General: Awake, no distress. *** CV:  Good peripheral perfusion. *** Resp:  Normal effort. *** Abd:  No distention. *** Other:  ***   ED Results / Procedures / Treatments   Labs (all labs ordered are listed, but only abnormal results are displayed) Labs Reviewed  CBC WITH DIFFERENTIAL/PLATELET - Abnormal; Notable for the following components:      Result Value   RBC 3.98 (*)    MCV 104.0 (*)    RDW 19.0 (*)    All other components within normal limits  COMPREHENSIVE METABOLIC PANEL WITH GFR - Abnormal; Notable for the following components:   Sodium 146 (*)    Glucose, Bld 103 (*)    BUN 33 (*)    Total Protein 6.3 (*)    Albumin 3.4 (*)    AST 42 (*)    All other components within normal limits  BRAIN NATRIURETIC PEPTIDE - Abnormal; Notable for the following components:   B Natriuretic Peptide 306.7 (*)    All other components within normal  limits  TROPONIN I (HIGH SENSITIVITY) - Abnormal; Notable for the following components:   Troponin I (High Sensitivity) 56 (*)    All other components within normal limits     EKG  EKG shows, ***   RADIOLOGY On my independent interpretation, ***   PROCEDURES:  Critical Care performed: {CriticalCareYesNo:19197::Yes, see critical care procedure note(s),No}  Procedures   MEDICATIONS ORDERED IN ED: Medications - No data to display   IMPRESSION / MDM / ASSESSMENT AND PLAN / ED COURSE  I reviewed the triage vital signs and the nursing notes.                              Differential diagnosis includes, but is not limited to, ***  Patient's presentation is most consistent with {EM COPA:27473}  Independent interpretation of labs  and imaging below. ***  The patient is on the cardiac monitor to evaluate for evidence of arrhythmia and/or significant heart rate changes.       FINAL CLINICAL IMPRESSION(S) / ED DIAGNOSES   Final diagnoses:  None     Rx / DC Orders   ED Discharge Orders     None        Note:  This document was prepared using Dragon voice recognition software and may include unintentional dictation errors.

## 2024-10-13 NOTE — Assessment & Plan Note (Addendum)
 Possible cellulitis bilateral lower extremities Likely chronic venous stasis worsened by possible heart failure Keep legs elevated Rocephin Additional improvement expected with diuretic therapy

## 2024-10-13 NOTE — Assessment & Plan Note (Signed)
 Rate controlled. Continue apixaban  and metoprolol .

## 2024-10-13 NOTE — Progress Notes (Signed)
 Progress Note   Patient: [name]. FMW:[id number] DOB: 09-Feb-1941 DOA: 10/12/2024     0 DOS: the patient was seen and examined on 10/13/2024   Brief hospital course: From HPI Wilberto Console. is a 83 y.o. 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  Patient presented initially for worsening of his chronic lower extremity edema and a several day history of redness including a red rash on the dorsum of both feet.  He also noted weeping of the feet due to the increased swelling.  He tried taking an extra dose of torsemide over the past few days without improvement.  He does not have a history of CHF and most recently saw his cardiologist on 09/21/2024 when his symptoms appeared attributable to his pulmonary hypertension.  He denies chest pain or cough beyond baseline or fever or chills. Additionally, patient mentions that he was recently having right lower anterior chest pain/rib cage pain which started after someone tried to helping out of a chair from behind and might have held onto his chest too tightly.  It has since improved.   In the ED BP 143/89 with otherwise normal vitals.  O2 sat 94% on oxygen  at home flow rate. Labs notable for troponin 56 and BNP 306 CBC unremarkable CMP with minor abnormalities but for the most part unremarkable  EKG showed A-fib at 96 Chest x-ray showed cardiomegaly with chronic interstitial lung disease and small pleural effusion      Assessment and Plan:  * Dyspnea on exertion Possible acute right  heart failure (echo 11/2023 with normal LV EF) Moderate pulmonary fibrosis with moderate pulmonary hypertension s/p RHC 07/2024 Suspect symptoms related to pulmonary hypertension Elevated BNP with small pleural effusion on chest x-ray Continue Lasix Keep legs elevated Follow-up on echocardiogram Daily weights with intake and output monitoring Can consider pulmonology versus cardiology consult     Bilateral leg edema-chronic venous stasis Possible cellulitis bilateral lower extremities Likely chronic venous stasis worsened by possible heart failure Keep legs elevated Continue Rocephin Additional improvement expected with diuretic therapy   Chronic respiratory failure with hypoxia (HCC) OSA on CPAP Pulmonary fibrosis with pulmonary hypertension Patient is at baseline O2 requirement of 5 L Follows with pulmonology, Dr. Parris CPAP nightly   CAD s/p CABG (coronary artery disease) Elevated troponin Troponin elevation likely related to demand ischemia.   EKG nonacute Patient does report atypical chest pain-lower right rib pain related to a possible trauma, since resolved Last evaluated by cardiology a month ago Continue Ranexa, metoprolol , atorvastatin  and apixaban Can consider cardiology consult   Permanent atrial fibrillation (HCC) Rate controlled Continue apixaban and metoprolol    Benign essential hypertension Continue home meds   DVT prophylaxis: Apixaban   Consults: none   Advance Care Planning:   Code Status: Prior    Family Communication: Wife and daughter at bedside   Disposition Plan: Back to previous home environment  Subjective:  Patient seen and examined at bedside this morning Denies nausea vomiting abdominal pain chest pain cough  Physical Exam: Vitals and nursing note reviewed.  Constitutional:      General: He is not in  acute distress. HENT:     Head: Normocephalic and atraumatic.  Cardiovascular:     Rate and Rhythm: Normal rate and regular  rhythm.     Heart sounds: Normal heart sounds.  Pulmonary:     Effort: Pulmonary effort is normal.     Breath sounds: Normal breath sounds.  Abdominal:     Palpations: Abdomen is soft.     Tenderness: There is no abdominal tenderness.  Musculoskeletal: Bilateral lower extremity edema Neurological:     Mental Status: Mental status is at baseline.   Vitals:   10/13/24 0800 10/13/24 1334 10/13/24 1335 10/13/24 1553  BP: (!) 166/76  (!) 140/77 (!) 146/82  Pulse: (!) 53  (!) 53 93  Resp: (!) 33  20   Temp:  98.4 F (36.9 C) 98.4 F (36.9 C) 98.1 F (36.7 C)  TempSrc:  Oral Oral   SpO2: 91%  100% 99%  Weight:      Height:        Data Reviewed:  I have reviewed patient chest x-ray showed cardiomegaly    Latest Ref Rng & Units 10/12/2024    7:24 PM 07/24/2024    1:26 PM 02/16/2022    3:11 AM  CBC  WBC 4.0 - 10.5 K/uL 6.2   8.2   Hemoglobin 13.0 - 17.0 g/dL 86.7  85.6  9.3   Hematocrit 39.0 - 52.0 % 41.4  42.0  28.8   Platelets 150 - 400 K/uL 196   149        Latest Ref Rng & Units 10/12/2024    7:24 PM 07/24/2024    1:26 PM 09/01/2022    7:25 AM  BMP  Glucose 70 - 99 mg/dL 896     BUN 8 - 23 mg/dL 33   19   Creatinine 9.38 - 1.24 mg/dL 8.99   9.06   Sodium 864 - 145 mmol/L 146  142    Potassium 3.5 - 5.1 mmol/L 4.5  4.0    Chloride 98 - 111 mmol/L 108     CO2 22 - 32 mmol/L 26     Calcium  8.9 - 10.3 mg/dL 9.0        Disposition: Status is: Inpatient   Time spent: 52 minutes  Author: Drue ONEIDA Potter, MD 10/13/2024 5:21 PM  For on call review [email or web address]

## 2024-10-14 DIAGNOSIS — R0609 Other forms of dyspnea: Secondary | ICD-10-CM | POA: Diagnosis not present

## 2024-10-14 LAB — BASIC METABOLIC PANEL WITH GFR
Anion gap: 12 (ref 5–15)
BUN: 23 mg/dL (ref 8–23)
CO2: 29 mmol/L (ref 22–32)
Calcium: 8.2 mg/dL — ABNORMAL LOW (ref 8.9–10.3)
Chloride: 100 mmol/L (ref 98–111)
Creatinine, Ser: 0.7 mg/dL (ref 0.61–1.24)
GFR, Estimated: >60 mL/min (ref >60)
Glucose, Bld: 108 mg/dL — ABNORMAL HIGH (ref 70–99)
Potassium: 2.9 mmol/L — ABNORMAL LOW (ref 3.5–5.1)
Sodium: 141 mmol/L (ref 135–145)

## 2024-10-14 LAB — CBC WITH DIFFERENTIAL/PLATELET
Abs Immature Granulocytes: 0.02 K/uL (ref 0.00–0.07)
Basophils Absolute: 0 K/uL (ref 0.0–0.1)
Basophils Relative: 0 %
Eosinophils Absolute: 0.2 K/uL (ref 0.0–0.5)
Eosinophils Relative: 3 %
HCT: 36.7 % — ABNORMAL LOW (ref 39.0–52.0)
Hemoglobin: 12.5 g/dL — ABNORMAL LOW (ref 13.0–17.0)
Immature Granulocytes: 0 %
Lymphocytes Relative: 12 %
Lymphs Abs: 0.8 K/uL (ref 0.7–4.0)
MCH: 34.2 pg — ABNORMAL HIGH (ref 26.0–34.0)
MCHC: 34.1 g/dL (ref 30.0–36.0)
MCV: 100.5 fL — ABNORMAL HIGH (ref 80.0–100.0)
Monocytes Absolute: 0.3 K/uL (ref 0.1–1.0)
Monocytes Relative: 4 %
Neutro Abs: 5.5 K/uL (ref 1.7–7.7)
Neutrophils Relative %: 81 %
Platelets: 163 K/uL (ref 150–400)
RBC: 3.65 MIL/uL — ABNORMAL LOW (ref 4.22–5.81)
RDW: 18.2 % — ABNORMAL HIGH (ref 11.5–15.5)
WBC: 6.8 K/uL (ref 4.0–10.5)
nRBC: 0 % (ref 0.0–0.2)

## 2024-10-14 LAB — ECHOCARDIOGRAM COMPLETE
AR max vel: 1.81 cm2
AV Area VTI: 1.74 cm2
AV Area mean vel: 1.92 cm2
AV Mean grad: 7 mmHg
AV Peak grad: 14.6 mmHg
Ao pk vel: 1.91 m/s
Area-P 1/2: 2.81 cm2
Calc EF: 36.5 %
Height: 71 in
MV VTI: 1.83 cm2
S' Lateral: 3.09 cm
Single Plane A2C EF: -5 %
Single Plane A4C EF: 44.5 %
Weight: 3680 [oz_av]

## 2024-10-14 MED ORDER — POTASSIUM CHLORIDE CRYS ER 20 MEQ PO TBCR
40.0000 meq | EXTENDED_RELEASE_TABLET | ORAL | Status: AC
  Administered 2024-10-14 (×2): 40 meq via ORAL
  Filled 2024-10-14 (×2): qty 2

## 2024-10-14 NOTE — Progress Notes (Addendum)
 Progress Note   Patient: [name]. FMW:[id number] DOB: 09/01/1941 DOA: 10/12/2024     1 DOS: the patient was seen and examined on 10/14/2024  Brief hospital course: From HPI [name]. is a 83 y.o. 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  Patient presented initially for worsening of his chronic lower extremity edema and a several day history of redness including a red rash on the dorsum of both feet.  He also noted weeping of the feet due to the increased swelling.  He tried taking an extra dose of torsemide over the past few days without improvement.  He does not have a history of CHF and most recently saw his cardiologist on 09/21/2024 when his symptoms appeared attributable to his pulmonary hypertension.  He denies chest pain or cough beyond baseline or fever or chills. Additionally, patient mentions that he was recently having right lower anterior chest pain/rib cage pain which started after someone tried to helping out of a chair from behind and might have held onto his chest too tightly.  It has since improved.   In the ED BP 143/89 with otherwise normal vitals.  O2 sat 94% on oxygen  at home flow rate. Labs notable for troponin 56 and BNP 306 CBC unremarkable CMP with minor abnormalities but for the most part unremarkable  EKG showed A-fib at 96 Chest x-ray showed cardiomegaly with chronic interstitial lung disease and small pleural effusion        Assessment and Plan:  * Dyspnea on exertion Possible acute right  heart failure (echo 11/2023 with normal LV EF) Moderate pulmonary fibrosis with moderate pulmonary hypertension s/p RHC 07/2024 Suspect symptoms related to pulmonary hypertension Elevated BNP with small pleural effusion on chest x-ray Continue IV Lasix Keep legs elevated Follow-up on echocardiogram Daily weights with intake and output monitoring     Bilateral leg edema-chronic venous stasis Possible cellulitis bilateral lower extremities Likely chronic venous stasis worsened by possible heart failure Keep legs elevated Continue Rocephin Additional improvement expected with diuretic therapy    Hypokalemia Continue repletion and monitoring   Chronic respiratory failure with hypoxia (HCC) OSA on CPAP Pulmonary fibrosis with pulmonary hypertension Patient is at baseline O2 requirement of 5 L Follows with pulmonology, Dr. Parris CPAP nightly Outpatient follow-up with pulmonologist   CAD s/p CABG (coronary artery disease) Elevated troponin Troponin elevation likely related to demand ischemia.   EKG nonacute No chest pain yesterday Continue Ranexa, metoprolol , atorvastatin  and apixaban Outpatient follow-up with cardiologist   Permanent atrial fibrillation (HCC) Rate controlled Continue apixaban and metoprolol    Benign essential hypertension Continue home meds   DVT prophylaxis: Apixaban   Consults: none   Advance Care Planning:   Code Status: Prior    Family Communication: None at bedside   Disposition Plan: Back to previous home environment   Subjective:  Patient seen and examined at bedside this morning Patient denies nausea vomiting abdominal pain chest pain cough Swelling and redness involving the bilateral lower extremity improving   Physical Exam: Vitals and nursing note reviewed.  Constitutional:      General: He is not in acute  distress. HENT:     Head: Normocephalic and atraumatic.  Cardiovascular:     Rate and Rhythm: Normal rate and regular  rhythm.     Heart sounds: Normal heart sounds.  Pulmonary:     Effort: Pulmonary effort is normal.     Breath sounds: Normal breath sounds.  Abdominal:     Palpations: Abdomen is soft.     Tenderness: There is no abdominal tenderness.  Musculoskeletal: Bilateral lower extremity edema and erythema noted to bilateral lower extremities Neurological:     Mental Status: Mental status is at baseline.     Data Reviewed:      Latest Ref Rng & Units 10/12/2024    7:24 PM 07/24/2024    1:26 PM 02/16/2022    3:11 AM  CBC  WBC 4.0 - 10.5 K/uL 6.2   8.2   Hemoglobin 13.0 - 17.0 g/dL 86.7  85.6  9.3   Hematocrit 39.0 - 52.0 % 41.4  42.0  28.8   Platelets 150 - 400 K/uL 196   149        Latest Ref Rng & Units 10/12/2024    7:24 PM 07/24/2024    1:26 PM 09/01/2022    7:25 AM  BMP  Glucose 70 - 99 mg/dL 896     BUN 8 - 23 mg/dL 33   19   Creatinine 9.38 - 1.24 mg/dL 8.99   9.06   Sodium 864 - 145 mmol/L 146  142    Potassium 3.5 - 5.1 mmol/L 4.5  4.0    Chloride 98 - 111 mmol/L 108     CO2 22 - 32 mmol/L 26     Calcium  8.9 - 10.3 mg/dL 9.0        Vitals:   88/91/74 0400 10/14/24 0402 10/14/24 0829 10/14/24 0904  BP: (!) 149/89  (!) 144/91 (!) 157/91  Pulse: 96  91 90  Resp: 18  (!) 23   Temp: 98.4 F (36.9 C)  98.3 F (36.8 C) 98.1 F (36.7 C)  TempSrc: Oral  Oral   SpO2: 99%  98% 97%  Weight:  103.5 kg    Height:         Author: Drue ONEIDA Potter, MD 10/14/2024 10:06 AM  For on call review [email or web address]

## 2024-10-15 ENCOUNTER — Other Ambulatory Visit: Payer: Self-pay

## 2024-10-15 DIAGNOSIS — R0609 Other forms of dyspnea: Secondary | ICD-10-CM | POA: Diagnosis not present

## 2024-10-15 LAB — CBC WITH DIFFERENTIAL/PLATELET
Abs Immature Granulocytes: 0.02 K/uL (ref 0.00–0.07)
Basophils Absolute: 0 K/uL (ref 0.0–0.1)
Basophils Relative: 0 %
Eosinophils Absolute: 0.3 K/uL (ref 0.0–0.5)
Eosinophils Relative: 5 %
HCT: 36.2 % — ABNORMAL LOW (ref 39.0–52.0)
Hemoglobin: 11.8 g/dL — ABNORMAL LOW (ref 13.0–17.0)
Immature Granulocytes: 0 %
Lymphocytes Relative: 17 %
Lymphs Abs: 1 K/uL (ref 0.7–4.0)
MCH: 33.8 pg (ref 26.0–34.0)
MCHC: 32.6 g/dL (ref 30.0–36.0)
MCV: 103.7 fL — ABNORMAL HIGH (ref 80.0–100.0)
Monocytes Absolute: 0.4 K/uL (ref 0.1–1.0)
Monocytes Relative: 7 %
Neutro Abs: 4.2 K/uL (ref 1.7–7.7)
Neutrophils Relative %: 71 %
Platelets: 170 K/uL (ref 150–400)
RBC: 3.49 MIL/uL — ABNORMAL LOW (ref 4.22–5.81)
RDW: 18 % — ABNORMAL HIGH (ref 11.5–15.5)
WBC: 5.9 K/uL (ref 4.0–10.5)
nRBC: 0.3 % — ABNORMAL HIGH (ref 0.0–0.2)

## 2024-10-15 LAB — BASIC METABOLIC PANEL WITH GFR
Anion gap: 8 (ref 5–15)
BUN: 21 mg/dL (ref 8–23)
CO2: 29 mmol/L (ref 22–32)
Calcium: 8.2 mg/dL — ABNORMAL LOW (ref 8.9–10.3)
Chloride: 105 mmol/L (ref 98–111)
Creatinine, Ser: 0.8 mg/dL (ref 0.61–1.24)
GFR, Estimated: >60 mL/min (ref >60)
Glucose, Bld: 100 mg/dL — ABNORMAL HIGH (ref 70–99)
Potassium: 3.3 mmol/L — ABNORMAL LOW (ref 3.5–5.1)
Sodium: 142 mmol/L (ref 135–145)

## 2024-10-15 MED ORDER — POTASSIUM CHLORIDE CRYS ER 20 MEQ PO TBCR
40.0000 meq | EXTENDED_RELEASE_TABLET | Freq: Two times a day (BID) | ORAL | Status: AC
  Administered 2024-10-15 (×2): 40 meq via ORAL
  Filled 2024-10-15 (×2): qty 2

## 2024-10-15 NOTE — Plan of Care (Signed)
 This patient remains on AR-2A as of time of writing. Patient's admission profile completed overnight by this RN. Patient declined offers for ambulation overnight by this RN. Potential for discharge to home tomorrow, 10/16/24. Consult to spiritual care initiated overnight per the patient's request.  Problem: Education: Goal: Knowledge of General Education information will improve Description: Including pain rating scale, medication(s)/side effects and non-pharmacologic comfort measures Outcome: Progressing   Problem: Health Behavior/Discharge Planning: Goal: Ability to manage health-related needs will improve Outcome: Progressing   Problem: Clinical Measurements: Goal: Ability to maintain clinical measurements within normal limits will improve Outcome: Progressing Goal: Will remain free from infection Outcome: Progressing Goal: Diagnostic test results will improve Outcome: Progressing Goal: Respiratory complications will improve Outcome: Progressing Goal: Cardiovascular complication will be avoided Outcome: Progressing   Problem: Activity: Goal: Risk for activity intolerance will decrease Outcome: Progressing   Problem: Nutrition: Goal: Adequate nutrition will be maintained Outcome: Progressing   Problem: Coping: Goal: Level of anxiety will decrease Outcome: Progressing   Problem: Elimination: Goal: Will not experience complications related to bowel motility Outcome: Progressing Goal: Will not experience complications related to urinary retention Outcome: Progressing   Problem: Pain Managment: Goal: General experience of comfort will improve and/or be controlled Outcome: Progressing   Problem: Safety: Goal: Ability to remain free from injury will improve Outcome: Progressing   Problem: Skin Integrity: Goal: Risk for impaired skin integrity will decrease Outcome: Progressing   Problem: Education: Goal: Ability to demonstrate management of disease process will  improve Outcome: Progressing Goal: Ability to verbalize understanding of medication therapies will improve Outcome: Progressing Goal: Individualized Educational Video(s) Outcome: Progressing   Problem: Activity: Goal: Capacity to carry out activities will improve Outcome: Progressing   Problem: Cardiac: Goal: Ability to achieve and maintain adequate cardiopulmonary perfusion will improve Outcome: Progressing   Problem: Education: Goal: Knowledge of disease or condition will improve Outcome: Progressing Goal: Knowledge of the prescribed therapeutic regimen will improve Outcome: Progressing Goal: Individualized Educational Video(s) Outcome: Progressing   Problem: Activity: Goal: Ability to tolerate increased activity will improve Outcome: Progressing Goal: Will verbalize the importance of balancing activity with adequate rest periods Outcome: Progressing   Problem: Respiratory: Goal: Ability to maintain a clear airway will improve Outcome: Progressing Goal: Levels of oxygenation will improve Outcome: Progressing Goal: Ability to maintain adequate ventilation will improve Outcome: Progressing   Problem: Education: Goal: Knowledge of disease or condition will improve Outcome: Progressing Goal: Understanding of medication regimen will improve Outcome: Progressing Goal: Individualized Educational Video(s) Outcome: Progressing   Problem: Activity: Goal: Ability to tolerate increased activity will improve Outcome: Progressing   Problem: Cardiac: Goal: Ability to achieve and maintain adequate cardiopulmonary perfusion will improve Outcome: Progressing   Problem: Health Behavior/Discharge Planning: Goal: Ability to safely manage health-related needs after discharge will improve Outcome: Progressing

## 2024-10-15 NOTE — Plan of Care (Signed)
  Problem: Education: Goal: Knowledge of General Education information will improve Description: Including pain rating scale, medication(s)/side effects and non-pharmacologic comfort measures Outcome: Progressing   Problem: Clinical Measurements: Goal: Respiratory complications will improve Outcome: Progressing   Problem: Clinical Measurements: Goal: Cardiovascular complication will be avoided Outcome: Progressing   Problem: Activity: Goal: Risk for activity intolerance will decrease Outcome: Progressing   Problem: Pain Managment: Goal: General experience of comfort will improve and/or be controlled Outcome: Progressing

## 2024-10-15 NOTE — Progress Notes (Signed)
 Progress Note   Patient: [name]. FMW:[id number] DOB: 03-06-1941 DOA: 10/12/2024     2 DOS: the patient was seen and examined on 10/15/2024    Brief hospital course: From HPI Maui Britten. is a 83 y.o. 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  Patient presented initially for worsening of his chronic lower extremity edema and a several day history of redness including a red rash on the dorsum of both feet.  He also noted weeping of the feet due to the increased swelling.  He tried taking an extra dose of torsemide over the past few days without improvement.  He does not have a history of CHF and most recently saw his cardiologist on 09/21/2024 when his symptoms appeared attributable to his pulmonary hypertension.  He denies chest pain or cough beyond baseline or fever or chills. Additionally, patient mentions that he was recently having right lower anterior chest pain/rib cage pain which started after someone tried to helping out of a chair from behind and might have held onto his chest too tightly.  It has since improved.   In the ED BP 143/89 with otherwise normal vitals.  O2 sat 94% on oxygen  at home flow rate. Labs notable for troponin 56 and BNP 306 CBC unremarkable CMP with minor abnormalities but for the most part unremarkable  EKG showed A-fib at 96 Chest x-ray showed cardiomegaly with chronic interstitial lung disease and small pleural effusion        Assessment and Plan:  * Dyspnea on exertion Possible acute right  heart failure (echo 11/2023 with normal LV EF) Moderate pulmonary fibrosis with moderate pulmonary hypertension s/p RHC 07/2024 Suspect symptoms related to pulmonary hypertension Elevated BNP with small pleural effusion on chest x-ray Continue IV Lasix Keep legs elevated Follow-up on echocardiogram Daily weights with intake and output monitoring     Bilateral leg edema-chronic venous stasis Possible cellulitis bilateral lower extremities Likely chronic venous stasis worsened by possible heart failure Keep legs elevated Continue Rocephin Additional improvement expected with diuretic therapy     Hypokalemia Continue repletion and monitoring     Chronic respiratory failure with hypoxia (HCC) OSA on CPAP Pulmonary fibrosis with pulmonary hypertension Patient is at baseline O2 requirement of 5 L Follows with pulmonology, Dr. Parris CPAP nightly Outpatient follow-up with pulmonologist   CAD s/p CABG (coronary artery disease) Elevated troponin Troponin elevation likely related to demand ischemia.   EKG nonacute No chest pain yesterday Continue Ranexa, metoprolol , atorvastatin  and apixaban Outpatient follow-up with cardiologist   Permanent atrial fibrillation (HCC) Rate controlled Continue apixaban and metoprolol    Benign essential hypertension Continue home meds   DVT prophylaxis: Apixaban   Consults: none   Advance Care Planning:   Code Status: Prior    Family Communication: None at bedside   Disposition Plan: Back to previous home environment   Subjective:  Patient seen and examined at bedside this morning Patient denies nausea vomiting abdominal pain chest pain cough Lower extremity swelling much improved Erythema also improving nicely  Physical Exam: Vitals and nursing note reviewed.  Constitutional:      General: He  is not in acute distress. HENT:     Head: Normocephalic and atraumatic.  Cardiovascular:     Rate and Rhythm: Normal rate and regular  rhythm.     Heart sounds: Normal heart sounds.  Pulmonary:     Effort: Pulmonary effort is normal.     Breath sounds: Normal breath sounds.  Abdominal:     Palpations: Abdomen is soft.     Tenderness: There is no abdominal tenderness.  Musculoskeletal: Bilateral lower extremity edema and erythema noted to bilateral lower extremities Neurological:     Mental Status: Mental status is at baseline.     Data Reviewed:    Vitals:   10/15/24 0427 10/15/24 0500 10/15/24 0814 10/15/24 1131  BP: (!) 141/96  135/88 (!) 141/79  Pulse:   90 97  Resp:      Temp: 97.6 F (36.4 C)  97.9 F (36.6 C) 97.9 F (36.6 C)  TempSrc: Oral  Oral   SpO2: 97%  98% 96%  Weight:  103.2 kg    Height:          Latest Ref Rng & Units 10/15/2024    5:16 AM 10/14/2024   10:45 AM 10/12/2024    7:24 PM  BMP  Glucose 70 - 99 mg/dL 899  891  896   BUN 8 - 23 mg/dL 21  23  33   Creatinine 0.61 - 1.24 mg/dL 9.19  9.29  8.99   Sodium 135 - 145 mmol/L 142  141  146   Potassium 3.5 - 5.1 mmol/L 3.3  2.9  4.5   Chloride 98 - 111 mmol/L 105  100  108   CO2 22 - 32 mmol/L 29  29  26    Calcium  8.9 - 10.3 mg/dL 8.2  8.2  9.0        Latest Ref Rng & Units 10/15/2024    5:16 AM 10/14/2024   10:45 AM 10/12/2024    7:24 PM  CBC  WBC 4.0 - 10.5 K/uL 5.9  6.8  6.2   Hemoglobin 13.0 - 17.0 g/dL 88.1  87.4  86.7   Hematocrit 39.0 - 52.0 % 36.2  36.7  41.4   Platelets 150 - 400 K/uL 170  163  196      Author: Drue ONEIDA Potter, MD 10/15/2024 3:47 PM  For on call review [email or web address]

## 2024-10-16 ENCOUNTER — Other Ambulatory Visit (HOSPITAL_COMMUNITY): Payer: Self-pay

## 2024-10-16 ENCOUNTER — Telehealth (HOSPITAL_COMMUNITY): Payer: Self-pay

## 2024-10-16 DIAGNOSIS — E669 Obesity, unspecified: Secondary | ICD-10-CM | POA: Insufficient documentation

## 2024-10-16 DIAGNOSIS — R0609 Other forms of dyspnea: Secondary | ICD-10-CM | POA: Diagnosis not present

## 2024-10-16 LAB — CBC WITH DIFFERENTIAL/PLATELET
Abs Immature Granulocytes: 0.05 K/uL (ref 0.00–0.07)
Basophils Absolute: 0 K/uL (ref 0.0–0.1)
Basophils Relative: 1 %
Eosinophils Absolute: 0.3 K/uL (ref 0.0–0.5)
Eosinophils Relative: 5 %
HCT: 37.5 % — ABNORMAL LOW (ref 39.0–52.0)
Hemoglobin: 12.2 g/dL — ABNORMAL LOW (ref 13.0–17.0)
Immature Granulocytes: 1 %
Lymphocytes Relative: 18 %
Lymphs Abs: 1 K/uL (ref 0.7–4.0)
MCH: 34.1 pg — ABNORMAL HIGH (ref 26.0–34.0)
MCHC: 32.5 g/dL (ref 30.0–36.0)
MCV: 104.7 fL — ABNORMAL HIGH (ref 80.0–100.0)
Monocytes Absolute: 0.4 K/uL (ref 0.1–1.0)
Monocytes Relative: 7 %
Neutro Abs: 4 K/uL (ref 1.7–7.7)
Neutrophils Relative %: 68 %
Platelets: 169 K/uL (ref 150–400)
RBC: 3.58 MIL/uL — ABNORMAL LOW (ref 4.22–5.81)
RDW: 18.1 % — ABNORMAL HIGH (ref 11.5–15.5)
WBC: 5.8 K/uL (ref 4.0–10.5)
nRBC: 0.3 % — ABNORMAL HIGH (ref 0.0–0.2)

## 2024-10-16 LAB — BASIC METABOLIC PANEL WITH GFR
Anion gap: 10 (ref 5–15)
BUN: 25 mg/dL — ABNORMAL HIGH (ref 8–23)
CO2: 29 mmol/L (ref 22–32)
Calcium: 8.5 mg/dL — ABNORMAL LOW (ref 8.9–10.3)
Chloride: 103 mmol/L (ref 98–111)
Creatinine, Ser: 0.92 mg/dL (ref 0.61–1.24)
GFR, Estimated: >60 mL/min (ref >60)
Glucose, Bld: 113 mg/dL — ABNORMAL HIGH (ref 70–99)
Potassium: 4 mmol/L (ref 3.5–5.1)
Sodium: 142 mmol/L (ref 135–145)

## 2024-10-16 MED ORDER — CEFAZOLIN SODIUM-DEXTROSE 2-4 GM/100ML-% IV SOLN
2.0000 g | Freq: Three times a day (TID) | INTRAVENOUS | Status: DC
  Administered 2024-10-16 – 2024-10-17 (×2): 2 g via INTRAVENOUS
  Filled 2024-10-16 (×3): qty 100

## 2024-10-16 MED ORDER — SODIUM CHLORIDE 0.9 % IV SOLN: INTRAVENOUS | Status: AC | PRN

## 2024-10-16 MED ORDER — FLUTICASONE FUROATE-VILANTEROL 200-25 MCG/ACT IN AEPB
1.0000 | INHALATION_SPRAY | Freq: Every day | RESPIRATORY_TRACT | Status: DC
  Administered 2024-10-16 – 2024-10-19 (×4): 1 via RESPIRATORY_TRACT
  Filled 2024-10-16: qty 28

## 2024-10-16 MED ORDER — SULFAMETHOXAZOLE-TRIMETHOPRIM 400-80 MG PO TABS
1.0000 | ORAL_TABLET | ORAL | Status: DC
  Administered 2024-10-16 – 2024-10-18 (×2): 1 via ORAL
  Filled 2024-10-16 (×2): qty 1

## 2024-10-16 MED ORDER — FUROSEMIDE 10 MG/ML IJ SOLN
60.0000 mg | Freq: Two times a day (BID) | INTRAMUSCULAR | Status: DC
  Administered 2024-10-16 – 2024-10-17 (×2): 60 mg via INTRAVENOUS
  Filled 2024-10-16 (×2): qty 6

## 2024-10-16 MED ORDER — DILTIAZEM HCL ER COATED BEADS 120 MG PO CP24
120.0000 mg | ORAL_CAPSULE | Freq: Every day | ORAL | Status: DC
  Administered 2024-10-16 – 2024-10-19 (×4): 120 mg via ORAL
  Filled 2024-10-16 (×4): qty 1

## 2024-10-16 MED ORDER — DILTIAZEM HCL ER COATED BEADS 120 MG PO CP24: 120.0000 mg | ORAL_CAPSULE | Freq: Two times a day (BID) | ORAL | Status: DC

## 2024-10-16 MED ORDER — TREPROSTINIL SODIUM 26.5 MCG IN CAPS: 53.0000 ug | ORAL_CAPSULE | Freq: Four times a day (QID) | RESPIRATORY_TRACT | Status: DC

## 2024-10-16 NOTE — Telephone Encounter (Signed)
 Pharmacy Patient Advocate Encounter  Insurance verification completed.    The patient is insured through [employer]/ [employer]. Patient has Medicare and is not eligible for a copay card, but may be able to apply for patient assistance or Medicare RX Payment Plan (Patient Must reach out to their plan, if eligible for payment plan), if available.    Ran test claim for Farxiga 10mg  and the current 30 day co-pay is $0.  Ran test claim for Jardiance 10mg  and the current 30 day co-pay is $0.  This test claim was processed through [employer]- copay amounts may vary at other pharmacies due to [employer], or as the patient moves through the different stages of their insurance plan.

## 2024-10-16 NOTE — TOC Initial Note (Signed)
 Transition of Care (TOC) - Initial/Assessment Note    Patient Details  Name: [name]. MRN: [id number] Date of Birth: 22-Feb-1941  Transition of Care [facility]) CM/SW Contact:    Shasta DELENA Daring, RN Phone Number: 10/16/2024, 3:16 PM  Clinical Narrative:                 RNCM assessed patient. He is reluctant to go to a SNF because he is the primary care provider for his wife who has mild dementia. (Wife is currently in ER as she was found down at home today). He is amenable to [facility]. His wife receives services from [facility]. He did say he would consider SNF if his wife is referred as well. Son lives about 5 miles away. RNCM will monitor for [facility] referral and additional TOC needs.  Expected Discharge Plan: Home w Home Health Services Barriers to Discharge: Continued Medical Work up   Patient Goals and CMS Choice Patient states their goals for this hospitalization and ongoing recovery are:: Get back home to take care of his wife          Expected Discharge Plan and Services In-house Referral: Clinical Social Work                                            Prior Living Arrangements/Services   Lives with:: Spouse Patient language and need for interpreter reviewed:: Yes Do you feel safe going back to the place where you live?: Yes      Need for Family Participation in Patient Care: Yes (Comment) Care giver support system in place?: Yes (comment)   Criminal Activity/Legal Involvement Pertinent to Current Situation/Hospitalization: No - Comment as needed  Activities of Daily Living   ADL Screening (condition at time of admission) Independently performs ADLs?: Yes (appropriate for developmental age) Is the patient deaf or have difficulty hearing?: No Does the patient have difficulty seeing, even when wearing glasses/contacts?: No Does the patient have difficulty concentrating, remembering, or making decisions?: No  Permission Sought/Granted                   Emotional Assessment Appearance:: Appears stated age Attitude/Demeanor/Rapport: Engaged, Gracious, Apprehensive Affect (typically observed): Apprehensive, Depressed, Overwhelmed, Sad Orientation: : Oriented to Self, Oriented to Place, Oriented to  Time, Oriented to Situation Alcohol  / Substance Use: Not Applicable Psych Involvement: No (comment)  Admission diagnosis:  Shortness of breath [R06.02] Peripheral edema [R60.0] Dyspnea on exertion [R06.09] Hypervolemia, unspecified hypervolemia type [E87.70] Patient Active Problem List   Diagnosis Date Noted   Obesity (BMI 30-39.9) 10/16/2024   Chronic respiratory failure with hypoxia (HCC) 10/13/2024   Controlled type 2 diabetes mellitus with complication, without long-term current use of insulin (HCC) 09/13/2023   Infrarenal abdominal aortic aneurysm (AAA) without rupture 12/30/2022   Pseudoaneurysm 08/14/2022   Groin hematoma 02/26/2022   ABLA (acute blood loss anemia) 02/15/2022   Right leg swelling    Angina pectoris 02/03/2022   Thoracic aortic ectasia 03/18/2020   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   Cellulitis of both lower extremities 11/07/2018   Lymphedema 08/17/2018   Carotid stenosis 08/14/2018   CAD s/p CABG (coronary artery disease) 08/12/2018   COPD (chronic obstructive pulmonary disease) (HCC) 08/12/2018   Gout 08/12/2018   Hyperglycemia 08/12/2018   Mixed hyperlipidemia 08/12/2018   Atherosclerosis of native arteries of extremity with intermittent claudication 08/12/2018  Bilateral leg edema-chronic venous stasis 07/12/2018   Dyspnea on exertion 07/12/2018   Left wrist pain 10/07/2017   Morbid obesity (HCC) 10/07/2017   Radial styloid tenosynovitis (de quervain) 10/07/2017   Senile purpura 09/20/2017   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/18/2016   Pulmonary fibrosis (HCC) 03/13/2016   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   Essential tremor 11/05/2015   Permanent atrial fibrillation (HCC) 09/30/2015    Benign essential hypertension 05/10/2015   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   Right knee pain 02/26/2015   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/12/2015   OSA (obstructive sleep apnea) 02/12/2015   PCP:  Fernande Ophelia JINNY DOUGLAS, MD Pharmacy:   [facility] [address] [place] - [address] [place] [address] Phone: [phone] Fax: [phone]     Social Drivers of Health (SDOH) Social History: SDOH Screenings   Food Insecurity: No Food Insecurity (10/13/2024)  Housing: Low Risk  (10/13/2024)  Transportation Needs: No Transportation Needs (10/13/2024)  Utilities: Not At Risk (10/13/2024)  Financial Resource Strain: Low Risk  (09/14/2023)   Received from [facility]  Social Connections: Unknown (10/13/2024)  Tobacco Use: Medium Risk (10/12/2024)   SDOH Interventions:     Readmission Risk Interventions     No data to display

## 2024-10-16 NOTE — Progress Notes (Signed)
 Heart Failure Navigator Progress Note  Assessed for Heart & Vascular TOC clinic readiness.  Patient does not meet criteria due to current [facility] Cardiology patient.   Navigator will sign off at this time.  Roxy Horseman, RN, BSN [facility] Heart Failure Navigator Secure Chat Only

## 2024-10-16 NOTE — Progress Notes (Signed)
   10/16/24 1415  Spiritual Encounters  Type of Visit Initial  Care provided to: Patient (Pt's wife and daughter in law are currently in the ED. Pt is his wife's caregiver and is anxious about her.)  Referral source Nurse (RN/NT/LPN)  Reason for visit Routine spiritual support  OnCall Visit No  Spiritual Framework  Presenting Themes Meaning/purpose/sources of inspiration;Values and beliefs;Coping tools;Impactful experiences and emotions;Other (comment) (Pt's stated his daughter died in 2023/12/26 from fatty liver issues)  Values/beliefs Pt is Christian  Strengths Resilient  Needs/Challenges/Barriers Grief and anxiety regarding his wife. Pt stated his wife seems to have the same dementia her mother had. He and his wife cared for her mother and she's exhibiting some of the same symptoms  Patient Stress Factors Exhausted;Family relationships;Health changes;Other (Comment) (Pt stated his son in law has a new love relationship already and is causing the Pt some heartache as well.)  Goals  Self/Personal Goals Is working w/daughter in social worker who has training in social work to help him decide next steps for him and his wife.  Interventions  Spiritual Care Interventions Made Established relationship of care and support;Compassionate presence;Reflective listening;Normalization of emotions;Decision-making support/facilitation;Reconciliation with self/others;Meaning making;Prayer;Encouragement;Provided grief education  Intervention Outcomes  Outcomes Connection to spiritual care;Awareness around self/spiritual resourses;Connection to values and goals of care;Awareness of support;Reduced anxiety;Reduced isolation

## 2024-10-16 NOTE — Progress Notes (Signed)
   10/16/24 1600  Spiritual Encounters  Type of Visit Initial  Care provided to: Patient  Referral source Chaplain assessment  Reason for visit Routine spiritual support  Interventions  Spiritual Care Interventions Made Compassionate presence;Reflective listening  Intervention Outcomes  Outcomes Awareness of support  Spiritual Care Plan  Spiritual Care Issues Still Outstanding No further spiritual care needs at this time (see row info)   Chaplain had met pt on a different visit and wanted to say hello and let him know that I had laid eyes on his wife and she's doing ok.

## 2024-10-16 NOTE — Progress Notes (Signed)
 Heart Failure Stewardship Pharmacy Note  PCP: Fernande Ophelia JINNY DOUGLAS, MD PCP-Cardiologist: None  HPI: [name]. is a 83 y.o. 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 ECG on admission noted AF. On admission, BNP was 306.7, HS-troponin was 56. Chest x-ray noted chronic ILD and superimposed bilateral lower lobe atelectasis with small right pleural effusion.   Pertinent cardiac history: Left atrium myxoma resection in 1998 a CABG with LIMA to LAD. Longstanding permanent AF, failed ablation and DCCV. LHC 02/2022 noted 40% left main stenosis, proximal to mid LAD 100% occluded, left circumflex 45% stenosis, RCA-40%, mid 60% and distal 40% stenosis. LIMA to LAD not injected. TTE 11/2023 noted LVEF of 58%, mild MR, moderate TR, RVSP of 63. Right heart cath 07/2024 with CI of 1.8, PVR of 5.8, mean PA 41 and wedge of 18, moderate pulmonary hypertension predominantly group 3. TTE this admission noted LVEF of 45%, moderate LVH.  Pertinent Lab Values: Creatinine  Date Value Ref Range Status  01/28/2012 0.85 0.60 - 1.30 mg/dL Final   Creatinine, Ser  Date Value Ref Range Status  10/16/2024 0.92 0.61 - 1.24 mg/dL Final   BUN  Date Value Ref Range Status  10/16/2024 25 (H) 8 - 23 mg/dL Final  97/78/7986 19 (H) 7 - 18 mg/dL Final   Potassium  Date Value Ref Range Status  10/16/2024 4.0 3.5 - 5.1 mmol/L Final  01/28/2012 4.4 3.5 - 5.1 mmol/L Final   Sodium  Date Value Ref Range Status  10/16/2024 142 135 - 145 mmol/L Final  01/28/2012 138 136 - 145 mmol/L Final   B Natriuretic Peptide  Date Value Ref Range Status  10/12/2024 306.7 (H) 0.0 - 100.0 pg/mL Final    Comment:     Performed at [facility], [address] [place], [place] [address]    Vital Signs: Temp:  [97.6 F (36.4 C)-98 F (36.7 C)] 97.7 F (36.5 C) (11/10 0411) Pulse Rate:  [90-99] 92 (11/10 0411) Cardiac Rhythm: Atrial fibrillation (11/09 1900) Resp:  [16-25] 18 (11/10 0500) BP: (132-141)/(78-88) 139/86 (11/10 0411) SpO2:  [95 %-98 %] 96 % (11/10 0411) Weight:  [102.1 kg (225 lb)-103.1 kg (227 lb 4.7 oz)] 102.1 kg (225 lb) (11/10 0521)  Intake/Output Summary (Last 24 hours) at 10/16/2024 9347 Last data filed at 10/16/2024 0500 Gross per 24 hour  Intake 1031.03 ml  Output 2425 ml  Net -1393.97 ml    Current Heart Failure Medications:  Loop diuretic: furosemide 40 mg IV BID Beta-Blocker: none ACEI/ARB/ARNI: none MRA: none SGLT2i: none Other: none  Prior to admission Heart Failure Medications:  Loop diuretic: torsemide 40 mg daily Beta-Blocker: metoprolol  succinate 50 mg daily ACEI/ARB/ARNI: none MRA: none SGLT2i: none Other: Yutrepia  Assessment: 1. 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 NYHA class III symptoms.  -Symptoms: Reports feeling improved since admisison. LEE is improving. Reports orthopnea. Reports abdominal distention. Appetite is good.  -Volume: Appears to still have some additional volume on board. Pruning noted in LEE. Urine output is decent on furosemide 40 mg IV BID. Continue current dose today, may be able to transition back to orals tomorrow pending exam. -Hemodynamics: BP is elevated. HR  100s. -BB: Metoprolol  currently held due to hypervolemia. Can resume when euvolemic prior to discharge. -ACEI/ARB/ARNI: Can consider adding prior to discharge. Entresto has the best data in patients with LVEF 40-50%. -MRA: Consider adding eplerenone 25 mg daily. Patient previously failed spironolactone due to gynecomastia.  -SGLT2i: Consider adding SGLT2i pending cost.  Plan: 1)  Medication changes recommended at this time: -Consider adding eplerenone 25 mg daily  2) Patient assistance: -Copay for Farxiga and Jardiance are $0  3) Education: - Patient has been educated on current HF medications and potential additions to HF medication regimen - Patient verbalizes understanding that over the next few months, these medication doses may change and more medications may be added to optimize HF regimen - Patient has been educated on basic disease state pathophysiology and goals of therapy  Please do not hesitate to reach out with questions or concerns,  Jaun Bash, PharmD, CPP, BCPS, [facility] Heart Failure Pharmacist  Phone - [phone] 10/16/2024 12:32 PM

## 2024-10-16 NOTE — Evaluation (Signed)
 Occupational Therapy Evaluation Patient Details Name: [name]. MRN: [id number] DOB: 1941-08-21 Today's Date: 10/16/2024   History of Present Illness   presented to ER secondary to LE edema; admitted for management of chronic respiratory failure secondary to CHF, pulmonary fibrosis and chronic venous stasis vs LE cellulitis to bilat LEs.    Clinical Impressions Pt was seen for OT evaluation this date. Prior to hospital admission, pt was generally independent and provides IADL assist to spouse who he reports has mild dementia. Pt presents with deficits in BLE strength, balance, and activity tolerance as well as rib/sternal pain, affecting safe and optimal ADL completion. Pt currently requires MIN A for bed mobility, MIN-MOD A +2 for STS transfers, and limited in mobility 2/2 desats to 84% on 4L and HR up to 134. Improves with seated rest break and VC for PLB, improving to 97% SpO2 on 4L, HR 100. Endorses 7/10 perceived rate of exertion with limited mobility and heavy reliance on the RW. Grossly MIN A for LB ADL tasks. Pt educated in home/routines modifications for ADL including sitting for showering, activity pacing, and work simplification strategies to promote safety/indep. Pt verbalized understanding and would benefit from skilled OT services to address noted impairments and functional limitations (see below for any additional details) in order to maximize safety and independence while minimizing future risk of falls, injury, and readmission. Anticipate the need for follow up OT services upon acute hospital DC.    If plan is discharge home, recommend the following:   A little help with walking and/or transfers;A little help with bathing/dressing/bathroom;Assistance with cooking/housework;Assist for transportation;Help with stairs or ramp for entrance     Functional Status Assessment   Patient has had a recent decline in their functional status and demonstrates the ability to make  significant improvements in function in a reasonable and predictable amount of time.     Equipment Recommendations   Other (comment) (defer)     Recommendations for Other Services         Precautions/Restrictions   Precautions Precautions: Fall Recall of Precautions/Restrictions: Intact Restrictions Weight Bearing Restrictions Per Provider Order: No     Mobility Bed Mobility Overal bed mobility: Needs Assistance Bed Mobility: Supine to Sit     Supine to sit: Min assist          Transfers Overall transfer level: Needs assistance Equipment used: Rolling walker (2 wheels) Transfers: Sit to/from Stand, Bed to chair/wheelchair/BSC Sit to Stand: Min assist, Mod assist, +2 physical assistance Stand pivot transfers: Min assist         General transfer comment: cuing for hand placement; increased pain with attempts to push through bilat UEs      Balance Overall balance assessment: Needs assistance Sitting-balance support: No upper extremity supported, Feet supported Sitting balance-Leahy Scale: Good     Standing balance support: Bilateral upper extremity supported, Reliant on assistive device for balance Standing balance-Leahy Scale: Poor                             ADL either performed or assessed with clinical judgement   ADL Overall ADL's : Needs assistance/impaired                     Lower Body Dressing: Sit to/from stand;Minimal assistance               Functional mobility during ADLs: Contact guard assist;Rolling walker (2 wheels) General ADL Comments:  Grossly MIN A for LB ADL tasks     Vision         Perception         Praxis         Pertinent Vitals/Pain Pain Assessment Pain Assessment: Faces Faces Pain Scale: Hurts even more Pain Location: chest/sternum, around ribcage Pain Descriptors / Indicators: Grimacing, Guarding, Sore Pain Intervention(s): Limited activity within patient's tolerance, Monitored  during session, Repositioned     Extremity/Trunk Assessment Upper Extremity Assessment Upper Extremity Assessment: Overall WFL for tasks assessed   Lower Extremity Assessment Lower Extremity Assessment: Generalized weakness (grossly 4-/5 througout; noted erythema knees distally bilat LEs)       Communication Communication Communication: No apparent difficulties   Cognition Arousal: Alert Behavior During Therapy: WFL for tasks assessed/performed                                 Following commands: Intact       Cueing  General Comments   Cueing Techniques: Verbal cues;Gestural cues;Tactile cues      Exercises Other Exercises Other Exercises: Pt educated in home/routines modifications for ADL including sitting for showering, activity pacing, and work simplification strategies to promote safety/indep   Shoulder Instructions      Home Living Family/patient expects to be discharged to:: Private residence Living Arrangements: Spouse/significant other Available Help at Discharge: Family Type of Home: House Home Access: Stairs to enter Secretary/administrator of Steps: 3 Entrance Stairs-Rails: Left Home Layout: One level     Bathroom Shower/Tub: Chief Strategy Officer: Handicapped height (BSC over toilet)     Home Equipment: Cane - single point;BSC/3in1;Shower seat          Prior Functioning/Environment Prior Level of Function : Independent/Modified Independent             Mobility Comments: At baseline, mod indep wiht ADLs, household and limited community mobilization; recent use of SPC due to increased edema, weakness to bilat LEs.  Denies recent fall history, but does endorse progressive weakness, difficulty getting up recently.  Home O2 at 5L      OT Problem List: Decreased strength;Pain;Decreased activity tolerance;Impaired balance (sitting and/or standing);Decreased knowledge of use of DME or AE   OT Treatment/Interventions:  Self-care/ADL training;Therapeutic exercise;Therapeutic activities;Energy conservation;DME and/or AE instruction;Patient/family education;Balance training      OT Goals(Current goals can be found in the care plan section)   ADL Goals Pt Will Perform Lower Body Dressing: with modified independence;sit to/from stand;with adaptive equipment Pt Will Transfer to Toilet: with modified independence;ambulating;bedside commode;regular height toilet (LRAD) Pt Will Perform Toileting - Clothing Manipulation and hygiene: with modified independence Additional ADL Goal #1: Pt will complete all aspects of bathing, primarily in sitting, wiht mod indep utilizing learned ECS, 1/1 opportunity. Additional ADL Goal #2: Pt will verbalize plan to implement at least 2 learned energy conservation strategies during ADL/IADL at home.   OT Frequency:  Min 2X/week    Co-evaluation              AM-PAC OT 6 Clicks Daily Activity     Outcome Measure Help from another person eating meals?: None Help from another person taking care of personal grooming?: None Help from another person toileting, which includes using toliet, bedpan, or urinal?: A Little Help from another person bathing (including washing, rinsing, drying)?: A Little Help from another person to put on and taking off regular upper body clothing?: None  Help from another person to put on and taking off regular lower body clothing?: A Little 6 Click Score: 21   End of Session Equipment Utilized During Treatment: Rolling walker (2 wheels);Oxygen   Activity Tolerance: Patient tolerated treatment well Patient left: in chair;with call bell/phone within reach;with chair alarm set  OT Visit Diagnosis: Unsteadiness on feet (R26.81);Muscle weakness (generalized) (M62.81)                Time: 0940-1010 OT Time Calculation (min): 30 min Charges:  OT General Charges $OT Visit: 1 Visit OT Evaluation $OT Eval Low Complexity: 1 Low OT Treatments $Self  Care/Home Management : 8-22 mins  Warren SAUNDERS., MPH, MS, OTR/L ascom [phone] 10/16/24, 1:20 PM

## 2024-10-16 NOTE — Progress Notes (Signed)
 While assisting patient with ADLs, patient received a call from his daughter-in-law that his wife was found on the floor (2nd fall in 24 hours).  He shared that he has been through so much stress, losing his daughter and worrying about his wife and caring for her.  She had been taking care of him prior to hospital visit.I offered Chaplin service and he accepted.  Chaplin notified.

## 2024-10-16 NOTE — Evaluation (Signed)
 Physical Therapy Evaluation Patient Details Name: [name]. MRN: [id number] DOB: January 30, 1941 Today's Date: 10/16/2024  History of Present Illness  presented to ER secondary to LE edema; admitted for management of chronic respiratory failure secondary to CHF, pulmonary fibrosis and chronic venous stasis vs LE cellulitis to bilat LEs.  Clinical Impression  Patient resting in bed upon arrival to room; alert and oriented, follows commands and agreeable to participation with treatment session.   Generally weak and deconditioned throughout all extremities, but grossly [facility] for basic transfers and gait (at least 4-/5).  Does endorse pain across chest/around ribcage with movement (FACES 6/10); meds requested/received prior to session. Currently requiring min assist for bed mobility; min/mod assist +2 with RW for sit/stand, standing balance and short-distance gait (20') With RW.  Demonstrates forward trunk flexion with mod WBing bilat UEs; short shuffling steps with decreased cadence, decreased balance and overall activity tolerance. Desat to 84% on 4L with gait efforts, requiring seated rest and pursed lip breathing for recovery >90%.  Notably fatigued with effort, but fair ability to self-pace distance and request/initiate rest period as needed. Would benefit from skilled PT to address above deficits and promote optimal return to PLOF.; recommend post-acute PT follow up as indicated by interdisciplinary care team.            If plan is discharge home, recommend the following: A lot of help with walking and/or transfers;A lot of help with bathing/dressing/bathroom   Can travel by private vehicle   Yes    Equipment Recommendations    Recommendations for Other Services       Functional Status Assessment Patient has had a recent decline in their functional status and demonstrates the ability to make significant improvements in function in a reasonable and predictable amount of time.      Precautions / Restrictions Precautions Precautions: Fall Restrictions Weight Bearing Restrictions Per Provider Order: No      Mobility  Bed Mobility Overal bed mobility: Needs Assistance Bed Mobility: Supine to Sit     Supine to sit: Min assist          Transfers Overall transfer level: Needs assistance Equipment used: Rolling walker (2 wheels) Transfers: Sit to/from Stand, Bed to chair/wheelchair/BSC Sit to Stand: Min assist, Mod assist Stand pivot transfers: Min assist         General transfer comment: cuing for hand placement; increased pain with attempts to push through bilat UEs    Ambulation/Gait Ambulation/Gait assistance: Min assist, +2 safety/equipment Gait Distance (Feet): 20 Feet Assistive device: Rolling walker (2 wheels)         General Gait Details: forward trunk flexion with mod WBing bilat UEs; short shuffling steps with decreased cadence, decreased balance and overall activity tolerance.  Desat to 84% on 4L with gait efforts, requiring seated rest and pursed lip breathing for recovery >90%  Stairs            Wheelchair Mobility     Tilt Bed    Modified Rankin (Stroke Patients Only)       Balance Overall balance assessment: Needs assistance Sitting-balance support: No upper extremity supported, Feet supported Sitting balance-Leahy Scale: Good     Standing balance support: Bilateral upper extremity supported Standing balance-Leahy Scale: Poor                               Pertinent Vitals/Pain Pain Assessment Pain Assessment: Faces Faces Pain Scale: Hurts even  more Pain Location: chest/sternum, around ribcage Pain Descriptors / Indicators: Grimacing, Guarding, Sore Pain Intervention(s): Limited activity within patient's tolerance, Monitored during session, Repositioned    Home Living Family/patient expects to be discharged to:: Private residence Living Arrangements: Spouse/significant other Available Help at  Discharge: Family Type of Home: House Home Access: Stairs to enter Entrance Stairs-Rails: Left Entrance Stairs-Number of Steps: 3   Home Layout: One level Home Equipment: Cane - single point      Prior Function Prior Level of Function : Independent/Modified Independent             Mobility Comments: At baseline, mod indep wiht ADLs, household and limited community mobilization; recent use of SPC due to increased edema, weakness to bilat LEs.  Denies recent fall history, but does endorse progressive weakness, difficulty getting up recently.  Home O2 at 5L.  Assists wife with caregiving responsibilities as needed.       Extremity/Trunk Assessment   Upper Extremity Assessment Upper Extremity Assessment: Overall WFL for tasks assessed    Lower Extremity Assessment Lower Extremity Assessment: Generalized weakness (grossly 4-/5 througout; noted erythema knees distally bilat LEs)       Communication   Communication Communication: No apparent difficulties    Cognition Arousal: Alert Behavior During Therapy: WFL for tasks assessed/performed   PT - Cognitive impairments: No apparent impairments                         Following commands: Intact       Cueing Cueing Techniques: Verbal cues, Gestural cues, Tactile cues     General Comments      Exercises     Assessment/Plan    PT Assessment Patient needs continued PT services  PT Problem List Decreased strength;Decreased range of motion;Decreased activity tolerance;Decreased balance;Decreased mobility;Decreased knowledge of use of DME;Decreased safety awareness;Decreased knowledge of precautions;Cardiopulmonary status limiting activity       PT Treatment Interventions DME instruction;Gait training;Stair training;Functional mobility training;Therapeutic activities;Therapeutic exercise;Balance training;Cognitive remediation;Patient/family education    PT Goals (Current goals can be found in the Care Plan  section)  Acute Rehab PT Goals Patient Stated Goal: to return home PT Goal Formulation: With patient Time For Goal Achievement: 10/30/24 Potential to Achieve Goals: Good    Frequency Min 2X/week     Co-evaluation               AM-PAC PT 6 Clicks Mobility  Outcome Measure Help needed turning from your back to your side while in a flat bed without using bedrails?: A Little Help needed moving from lying on your back to sitting on the side of a flat bed without using bedrails?: A Little Help needed moving to and from a bed to a chair (including a wheelchair)?: A Lot Help needed standing up from a chair using your arms (e.g., wheelchair or bedside chair)?: A Lot Help needed to walk in hospital room?: A Little Help needed climbing 3-5 steps with a railing? : A Lot 6 Click Score: 15    End of Session Equipment Utilized During Treatment: Gait belt Activity Tolerance: Patient tolerated treatment well Patient left: in chair;with call bell/phone within reach;with chair alarm set Nurse Communication: Mobility status PT Visit Diagnosis: Muscle weakness (generalized) (M62.81);Difficulty in walking, not elsewhere classified (R26.2)    Time: 9067-8989 PT Time Calculation (min) (ACUTE ONLY): 38 min   Charges:   PT Evaluation $PT Eval Moderate Complexity: 1 Mod   PT General Charges $$ ACUTE PT VISIT:  1 Visit         Erven Ramson H. Delores, PT, DPT, NCS 10/16/24, 11:45 AM [phone]

## 2024-10-16 NOTE — Progress Notes (Signed)
 Progress Note   Patient: [name]. FMW:[id number] DOB: 1941/02/10 DOA: 10/12/2024     3 DOS: the patient was seen and examined on 10/16/2024    Brief hospital course: From HPI [name]. is a 83 y.o. 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  Patient presented initially for worsening of his chronic lower extremity edema and a several day history of redness including a red rash on the dorsum of both feet.  He also noted weeping of the feet due to the increased swelling.  He tried taking an extra dose of torsemide over the past few days without improvement.  He does not have a history of CHF and most recently saw his cardiologist on 09/21/2024 when his symptoms appeared attributable to his pulmonary hypertension.  He denies chest pain or cough beyond baseline or fever or chills. Additionally, patient mentions that he was recently having right lower anterior chest pain/rib cage pain which started after someone tried to helping out of a chair from behind and might have held onto his chest too tightly.  It has since improved.   In the ED BP 143/89 with otherwise normal vitals.  O2 sat 94% on oxygen  at home flow rate. Labs notable for troponin 56 and BNP 306 CBC unremarkable CMP with minor abnormalities but for the most part unremarkable  EKG showed A-fib at 96 Chest x-ray showed cardiomegaly with chronic interstitial lung disease and small pleural effusion        Assessment and Plan:  Dyspnea on exertion Moderate pulmonary  fibrosis with moderate pulmonary hypertension s/p RHC 07/2024 Suspect symptoms related to pulmonary hypertension Elevated BNP with small pleural effusion on chest x-ray Tte appears unchanged from priors, ef 45-50 Continue IV Lasix, will increase from 40 iv bid to 60 as on torsemide 40 at home   Bilateral leg edema-chronic venous stasis Possible cellulitis bilateral lower extremities Likely chronic venous stasis worsened by possible heart failure Keep legs elevated Rocephin > cefazolin  Will see about unna boots   Chronic respiratory failure with hypoxia (HCC) OSA on CPAP Pulmonary fibrosis with pulmonary hypertension Patient is at baseline O2 requirement of 5 L Follows with pulmonology, Dr. Aleskerov CPAP nightly Outpatient follow-up with pulmonologist   CAD s/p CABG (coronary artery disease) Elevated troponin Troponin elevation likely related to demand ischemia.   EKG nonacute No chest pain yesterday Continue Ranexa, metoprolol , atorvastatin  and apixaban Outpatient follow-up with cardiologist   Permanent atrial fibrillation (HCC) Rate controlled Continue apixaban and dilt   Benign essential hypertension Continue home meds   DVT prophylaxis: Apixaban   Consults: none   Advance Care Planning:   Code Status: Prior    Family Communication: None at bedside   Disposition Plan: Back to previous home environment with home health, declines snf   Subjective:  Reports breathing stable at baseline, legs less swollen but still red  Physical Exam: Vitals and nursing note reviewed.  Constitutional:      General: He is not in acute distress. HENT:     Head: Normocephalic and atraumatic.  Cardiovascular:     Rate and Rhythm: Normal rate and regular  rhythm.     Heart sounds: Normal heart sounds.  Pulmonary:     Effort: Pulmonary effort is normal.     Breath sounds: Normal breath sounds.  Abdominal:     Palpations: Abdomen is soft.     Tenderness: There is no abdominal  tenderness.  Musculoskeletal: Bilateral lower extremity edema and erythema noted to bilateral lower extremities Skin  Neurological:     Mental Status: Mental status is at baseline.     Data Reviewed:    Vitals:   10/16/24 0521 10/16/24 0943 10/16/24 1135 10/16/24 1312  BP:  (!) 142/86  117/70  Pulse:  (!) 101  (!) 108  Resp:      Temp:  98.5 F (36.9 C)  98.3 F (36.8 C)  TempSrc:      SpO2:  (!) 89% (!) 84% 100%  Weight: 102.1 kg     Height:          Latest Ref Rng & Units 10/16/2024    4:32 AM 10/15/2024    5:16 AM 10/14/2024   10:45 AM  BMP  Glucose 70 - 99 mg/dL 886  899  891   BUN 8 - 23 mg/dL 25  21  23    Creatinine 0.61 - 1.24 mg/dL 9.07  9.19  9.29   Sodium 135 - 145 mmol/L 142  142  141   Potassium 3.5 - 5.1 mmol/L 4.0  3.3  2.9   Chloride 98 - 111 mmol/L 103  105  100   CO2 22 - 32 mmol/L 29  29  29    Calcium  8.9 - 10.3 mg/dL 8.5  8.2  8.2        Latest Ref Rng & Units 10/16/2024    4:32 AM 10/15/2024    5:16 AM 10/14/2024   10:45 AM  CBC  WBC 4.0 - 10.5 K/uL 5.8  5.9  6.8   Hemoglobin 13.0 - 17.0 g/dL 87.7  88.1  87.4   Hematocrit 39.0 - 52.0 % 37.5  36.2  36.7   Platelets 150 - 400 K/uL 169  170  163      Author: Devaughn KATHEE Ban, MD 10/16/2024 3:30 PM  For on call review [email or web address].

## 2024-10-17 DIAGNOSIS — R0609 Other forms of dyspnea: Secondary | ICD-10-CM | POA: Diagnosis not present

## 2024-10-17 LAB — CBC WITH DIFFERENTIAL/PLATELET
Abs Immature Granulocytes: 0.07 K/uL (ref 0.00–0.07)
Basophils Absolute: 0 K/uL (ref 0.0–0.1)
Basophils Relative: 1 %
Eosinophils Absolute: 0.4 K/uL (ref 0.0–0.5)
Eosinophils Relative: 7 %
HCT: 38 % — ABNORMAL LOW (ref 39.0–52.0)
Hemoglobin: 12.5 g/dL — ABNORMAL LOW (ref 13.0–17.0)
Immature Granulocytes: 1 %
Lymphocytes Relative: 18 %
Lymphs Abs: 1 K/uL (ref 0.7–4.0)
MCH: 34.1 pg — ABNORMAL HIGH (ref 26.0–34.0)
MCHC: 32.9 g/dL (ref 30.0–36.0)
MCV: 103.5 fL — ABNORMAL HIGH (ref 80.0–100.0)
Monocytes Absolute: 0.6 K/uL (ref 0.1–1.0)
Monocytes Relative: 11 %
Neutro Abs: 3.6 K/uL (ref 1.7–7.7)
Neutrophils Relative %: 62 %
Platelets: 162 K/uL (ref 150–400)
RBC: 3.67 MIL/uL — ABNORMAL LOW (ref 4.22–5.81)
RDW: 18.1 % — ABNORMAL HIGH (ref 11.5–15.5)
WBC: 5.7 K/uL (ref 4.0–10.5)
nRBC: 0 % (ref 0.0–0.2)

## 2024-10-17 LAB — BASIC METABOLIC PANEL WITH GFR
Anion gap: 13 (ref 5–15)
BUN: 25 mg/dL — ABNORMAL HIGH (ref 8–23)
CO2: 29 mmol/L (ref 22–32)
Calcium: 8.8 mg/dL — ABNORMAL LOW (ref 8.9–10.3)
Chloride: 101 mmol/L (ref 98–111)
Creatinine, Ser: 0.97 mg/dL (ref 0.61–1.24)
GFR, Estimated: >60 mL/min (ref >60)
Glucose, Bld: 96 mg/dL (ref 70–99)
Potassium: 4.2 mmol/L (ref 3.5–5.1)
Sodium: 143 mmol/L (ref 135–145)

## 2024-10-17 MED ORDER — CEPHALEXIN 500 MG PO CAPS
500.0000 mg | ORAL_CAPSULE | Freq: Two times a day (BID) | ORAL | Status: DC
  Administered 2024-10-17 – 2024-10-19 (×4): 500 mg via ORAL
  Filled 2024-10-17 (×4): qty 1

## 2024-10-17 MED ORDER — FUROSEMIDE 10 MG/ML IJ SOLN
60.0000 mg | Freq: Two times a day (BID) | INTRAMUSCULAR | Status: AC
  Administered 2024-10-17: 60 mg via INTRAVENOUS
  Filled 2024-10-17: qty 6

## 2024-10-17 NOTE — Consult Note (Addendum)
 WOC Nurse Consult Note: patient with longstanding history of PVD, has undergone vascular intervention in the past Reason for Consult: assess for compression  Wound type: no open wounds noted at this time; some dark discoloration to anterior aspect B lower legs; deep erythema with venous stasis appearing rash  Pressure Injury POA: NA  Measurement: widespread B lower legs  Wound bed: as above, no open active wound at this time  Drainage (amount, consistency, odor) weeping serous fluid on admission  Periwound: edema, chronic discoloration  Dressing procedure/placement/frequency: Cleanse B lower legs/feet with soap and water  and dry. Apply Xeroform gauze ([place] [phone]) to dorsal feet, anterior and posterior legs, cover with ABD pads and secure with Kerlix roll gauze beginning right above toes and ending right below knees.  Apply Ace bandage in same fashion as Kerlix for light compression.   Patient last seen in vascular office 9/11 with recommendations for lifestyle changes to manage PVD.  ABI's 08/20/2024 with ABI 1.04 R and noncompressible on L.    POC discussed with bedside nurse. WOC team will re-evaluate when on campus.    Thank you,    Powell Bar MSN, RN-BC, [employer]

## 2024-10-17 NOTE — Plan of Care (Signed)
  Problem: Clinical Measurements: Goal: Ability to maintain clinical measurements within normal limits will improve Outcome: Progressing   Problem: Activity: Goal: Risk for activity intolerance will decrease Outcome: Progressing   Problem: Pain Managment: Goal: General experience of comfort will improve and/or be controlled Outcome: Progressing   Problem: Clinical Measurements: Goal: Respiratory complications will improve Outcome: Progressing

## 2024-10-17 NOTE — Care Management Important Message (Signed)
 Important Message  Patient Details  Name: [name]. MRN: [id number] Date of Birth: [birth year]   Important Message Given:  Yes - Medicare IM completed 10/16/24 did not document     Rojelio [name] 10/17/2024, 10:20 AM

## 2024-10-17 NOTE — Progress Notes (Signed)
 Heart Failure Stewardship Pharmacy Note  PCP: Fernande Ophelia JINNY DOUGLAS, MD PCP-Cardiologist: None  HPI: [name]. is a 83 y.o. 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 ECG on admission noted AF. On admission, BNP was 306.7, HS-troponin was 56. Chest x-ray noted chronic ILD and superimposed bilateral lower lobe atelectasis with small right pleural effusion.   Pertinent cardiac history: Left atrium myxoma resection in 1998 a CABG with LIMA to LAD. Longstanding permanent AF, failed ablation and DCCV. LHC 02/2022 noted 40% left main stenosis, proximal to mid LAD 100% occluded, left circumflex 45% stenosis, RCA-40%, mid 60% and distal 40% stenosis. LIMA to LAD not injected. TTE 11/2023 noted LVEF of 58%, mild MR, moderate TR, RVSP of 63. Right heart cath 07/2024 with CI of 1.8, PVR of 5.8, mean PA 41 and wedge of 18, moderate pulmonary hypertension predominantly group 3. TTE this admission noted LVEF of 45%, moderate LVH.  Pertinent Lab Values: Creatinine  Date Value Ref Range Status  01/28/2012 0.85 0.60 - 1.30 mg/dL Final   Creatinine, Ser  Date Value Ref Range Status  10/17/2024 0.97 0.61 - 1.24 mg/dL Final   BUN  Date Value Ref Range Status  10/17/2024 25 (H) 8 - 23 mg/dL Final  97/78/7986 19 (H) 7 - 18 mg/dL Final   Potassium  Date Value Ref Range Status  10/17/2024 4.2 3.5 - 5.1 mmol/L Final  01/28/2012 4.4 3.5 - 5.1 mmol/L Final   Sodium  Date Value Ref Range Status  10/17/2024 143 135 - 145 mmol/L Final  01/28/2012 138 136 - 145 mmol/L Final   B Natriuretic Peptide  Date Value Ref Range Status  10/12/2024 306.7 (H) 0.0 - 100.0 pg/mL Final    Comment:     Performed at [facility], [address] [place], [place] [address]    Vital Signs: Temp:  [97.4 F (36.3 C)-98.5 F (36.9 C)] 97.4 F (36.3 C) (11/11 0428) Pulse Rate:  [83-108] 86 (11/11 0428) Cardiac Rhythm: Atrial fibrillation (11/11 0748) Resp:  [16-19] 16 (11/11 0428) BP: (117-142)/(70-89) 139/84 (11/11 0428) SpO2:  [84 %-100 %] 95 % (11/11 0428) Weight:  [100.5 kg (221 lb 9 oz)] 100.5 kg (221 lb 9 oz) (11/11 0426)  Intake/Output Summary (Last 24 hours) at 10/17/2024 9166 Last data filed at 10/17/2024 0600 Gross per 24 hour  Intake 821.43 ml  Output 2000 ml  Net -1178.57 ml    Current Heart Failure Medications:  Loop diuretic: furosemide 40 mg IV BID Beta-Blocker: none ACEI/ARB/ARNI: none MRA: none SGLT2i: none Other: none  Prior to admission Heart Failure Medications:  Loop diuretic: torsemide 40 mg daily Beta-Blocker: metoprolol  succinate 50 mg daily ACEI/ARB/ARNI: none MRA: none SGLT2i: none Other: [place]  Assessment: 1. 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 NYHA class III symptoms.  -Symptoms: Reports feeling improved since admission, but not back to baseline. LEE is improving. Reports orthopnea. Reports abdominal distention. Appetite is good. Patient is worrying about caring for his wife after discharge. -Volume: Appears to still have some additional volume on board. Pruning noted in LEE. Currently on furosemide 60 mg IV BID. Continue current dose today, may be able to transition back  to orals tomorrow pending exam. -Hemodynamics: BP is elevated. HR 100s. -BB: Metoprolol  currently held due to hypervolemia. Can resume when euvolemic prior to discharge. May benefit from transition to bisoprolol given COPD. Titrating bisoprolol may allow for stopping diltiazem . Given HFmrEF, diltiazem  is not overtly contraindicated, but not ideal. -ACEI/ARB/ARNI: Can consider adding prior to  discharge. Entresto has the best data in patients with LVEF 40-50%. -MRA: Consider adding eplerenone 25 mg daily when Bactrim course is complete. Patient previously failed spironolactone due to gynecomastia.  -SGLT2i: Consider adding SGLT2i pending cost.  Plan: 1) Medication changes recommended at this time: -Consider adding eplerenone 25 mg daily and transitioning from Bactrim DS to another prophylactic agent if require -Consider transitioning from diltiazem  to bisoprolol given mildly reduced LVEF.  2) Patient assistance: -Copay for Doreen and Jardiance are $0  3) Education: - Patient has been educated on current HF medications and potential additions to HF medication regimen - Patient verbalizes understanding that over the next few months, these medication doses may change and more medications may be added to optimize HF regimen - Patient has been educated on basic disease state pathophysiology and goals of therapy  Please do not hesitate to reach out with questions or concerns,  Jaun Bash, PharmD, CPP, BCPS, [facility] Heart Failure Pharmacist  Phone - [phone] 10/17/2024 8:33 AM

## 2024-10-17 NOTE — Plan of Care (Signed)
  Problem: Education: Goal: Knowledge of General Education information will improve Description: Including pain rating scale, medication(s)/side effects and non-pharmacologic comfort measures Outcome: Progressing   Problem: Health Behavior/Discharge Planning: Goal: Ability to manage health-related needs will improve Outcome: Progressing   Problem: Clinical Measurements: Goal: Ability to maintain clinical measurements within normal limits will improve Outcome: Progressing Goal: Will remain free from infection Outcome: Progressing Goal: Diagnostic test results will improve Outcome: Progressing Goal: Respiratory complications will improve Outcome: Progressing Goal: Cardiovascular complication will be avoided Outcome: Progressing   Problem: Activity: Goal: Risk for activity intolerance will decrease Outcome: Progressing   Problem: Nutrition: Goal: Adequate nutrition will be maintained Outcome: Progressing   Problem: Coping: Goal: Level of anxiety will decrease Outcome: Progressing   Problem: Elimination: Goal: Will not experience complications related to bowel motility Outcome: Progressing Goal: Will not experience complications related to urinary retention Outcome: Progressing   Problem: Pain Managment: Goal: General experience of comfort will improve and/or be controlled Outcome: Progressing   Problem: Safety: Goal: Ability to remain free from injury will improve Outcome: Progressing   Problem: Skin Integrity: Goal: Risk for impaired skin integrity will decrease Outcome: Progressing   Problem: Education: Goal: Ability to demonstrate management of disease process will improve Outcome: Progressing Goal: Ability to verbalize understanding of medication therapies will improve Outcome: Progressing Goal: Individualized Educational Video(s) Outcome: Progressing   Problem: Activity: Goal: Capacity to carry out activities will improve Outcome: Progressing    Problem: Cardiac: Goal: Ability to achieve and maintain adequate cardiopulmonary perfusion will improve Outcome: Progressing   Problem: Education: Goal: Knowledge of disease or condition will improve Outcome: Progressing Goal: Knowledge of the prescribed therapeutic regimen will improve Outcome: Progressing Goal: Individualized Educational Video(s) Outcome: Progressing   Problem: Activity: Goal: Ability to tolerate increased activity will improve Outcome: Progressing Goal: Will verbalize the importance of balancing activity with adequate rest periods Outcome: Progressing   Problem: Respiratory: Goal: Ability to maintain a clear airway will improve Outcome: Progressing Goal: Levels of oxygenation will improve Outcome: Progressing Goal: Ability to maintain adequate ventilation will improve Outcome: Progressing   Problem: Education: Goal: Knowledge of disease or condition will improve Outcome: Progressing Goal: Understanding of medication regimen will improve Outcome: Progressing Goal: Individualized Educational Video(s) Outcome: Progressing   Problem: Activity: Goal: Ability to tolerate increased activity will improve Outcome: Progressing   Problem: Cardiac: Goal: Ability to achieve and maintain adequate cardiopulmonary perfusion will improve Outcome: Progressing   Problem: Health Behavior/Discharge Planning: Goal: Ability to safely manage health-related needs after discharge will improve Outcome: Progressing

## 2024-10-17 NOTE — [place] FL2 (Signed)
 [place]  MEDICAID FL2 LEVEL OF CARE FORM     IDENTIFICATION  Patient Name: [name]. Birthdate: 28-Dec-1940 Sex: male Admission Date (Current Location): 10/12/2024  [facility] and [place] Number:  Chiropodist and Address:  [facility], [address], [place], [place] [address]      Provider Number: [id number]  Attending Physician Name and Address:  Kandis Devaughn Sayres, MD  Relative Name and Phone Number:       Current Level of Care: Hospital Recommended Level of Care: Skilled Nursing Facility Prior Approval Number:    Date Approved/Denied:   PASRR Number: [id number]  Discharge Plan: SNF    Current Diagnoses: Patient Active Problem List   Diagnosis Date Noted   Obesity (BMI 30-39.9) 10/16/2024   Chronic respiratory failure with hypoxia (HCC) 10/13/2024   Controlled type 2 diabetes mellitus with complication, without long-term current use of insulin (HCC) 09/13/2023   Infrarenal abdominal aortic aneurysm (AAA) without rupture 12/30/2022   Pseudoaneurysm 08/14/2022   Groin hematoma 02/26/2022   ABLA (acute blood loss anemia) 02/15/2022   Right leg swelling    Angina pectoris 02/03/2022   Thoracic aortic ectasia 03/18/2020   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   Cellulitis of both lower extremities 11/07/2018   Lymphedema 08/17/2018   Carotid stenosis 08/14/2018   CAD s/p CABG (coronary artery disease) 08/12/2018   COPD (chronic obstructive pulmonary disease) (HCC) 08/12/2018   Gout 08/12/2018   Hyperglycemia 08/12/2018   Mixed hyperlipidemia 08/12/2018   Atherosclerosis of native arteries of extremity with intermittent claudication 08/12/2018   Bilateral leg edema-chronic venous stasis 07/12/2018   Dyspnea on exertion 07/12/2018   Left wrist pain 10/07/2017   Morbid obesity (HCC) 10/07/2017   Radial styloid tenosynovitis (de quervain) 10/07/2017   Senile purpura 09/20/2017   Facet arthritis of lumbar region  09/18/2016   Pulmonary fibrosis (HCC) 03/13/2016   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   Essential tremor 11/05/2015   Permanent atrial fibrillation (HCC) 09/30/2015   Benign essential hypertension 05/10/2015   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   Right knee pain 02/26/2015   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/12/2015   OSA (obstructive sleep apnea) 02/12/2015    Orientation RESPIRATION BLADDER Height & Weight     Self, Time, Situation, Place  O2 (Nasal Cannula 2 L) Continent Weight: 221 lb 9 oz (100.5 kg) Height:  5' 11 (180.3 cm)  BEHAVIORAL SYMPTOMS/MOOD NEUROLOGICAL BOWEL NUTRITION STATUS   (None)  (None) Continent Diet (Heart healthy)  AMBULATORY STATUS COMMUNICATION OF NEEDS Skin   Limited Assist Verbally Bruising, Other (Comment) (Erythema/redness, weeping. Wound on right pretibial: No dressing listed.)                       Personal Care Assistance Level of Assistance  Bathing, Feeding, Dressing Bathing Assistance: Limited assistance Feeding assistance: Limited assistance Dressing Assistance: Limited assistance     Functional Limitations Info  Sight, Speech, Hearing Sight Info: Adequate Hearing Info: Adequate Speech Info: Adequate    SPECIAL CARE FACTORS FREQUENCY  PT (By licensed PT), OT (By licensed OT)     PT Frequency: 5 x week OT Frequency: 5 x week            Contractures Contractures Info: Not present    Additional Factors Info  Code Status, Allergies Code Status Info: Full code Allergies Info: Fish Allergy, Spironolactone, Hydrocodone , Tramadol , Hydrochlorothiazide, Iodine , Shellfish Allergy           Current  Medications (10/17/2024):  This is the current hospital active medication list Current Facility-Administered Medications  Medication Dose Route Frequency Provider Last Rate Last Admin   acetaminophen  (TYLENOL ) tablet 650 mg  650 mg Oral Q6H PRN Duncan, Hazel V, MD   650 mg at 10/17/24 0930   Or    acetaminophen  (TYLENOL ) suppository 650 mg  650 mg Rectal Q6H PRN Duncan, Hazel V, MD       apixaban WINN) tablet 5 mg  5 mg Oral BID Duncan, Hazel V, MD   5 mg at 10/17/24 0931   atorvastatin  (LIPITOR) tablet 40 mg  40 mg Oral Daily Duncan, Hazel V, MD   40 mg at 10/17/24 9068   cephALEXin  (KEFLEX ) capsule 500 mg  500 mg Oral Q12H Wouk, Devaughn Sayres, MD       diltiazem  (CARDIZEM  CD) 24 hr capsule 120 mg  120 mg Oral Daily Kandis Devaughn Sayres, MD   120 mg at 10/17/24 0931   DULoxetine (CYMBALTA) DR capsule 60 mg  60 mg Oral Daily Duncan, Hazel V, MD   60 mg at 10/17/24 0930   fluticasone furoate-vilanterol (BREO ELLIPTA) 200-25 MCG/ACT 1 puff  1 puff Inhalation Daily Kandis Devaughn Sayres, MD   1 puff at 10/17/24 0938   furosemide (LASIX) injection 60 mg  60 mg Intravenous BID Wouk, Devaughn Sayres, MD       ipratropium-albuterol  (DUONEB) 0.5-2.5 (3) MG/3ML nebulizer solution 3 mL  3 mL Inhalation Q6H PRN Cleatus Delayne GAILS, MD       levothyroxine  (SYNTHROID ) tablet 25 mcg  25 mcg Oral Q0600 Duncan, Hazel V, MD   25 mcg at 10/17/24 0557   ondansetron  (ZOFRAN ) tablet 4 mg  4 mg Oral Q6H PRN Duncan, Hazel V, MD       Or   ondansetron  (ZOFRAN ) injection 4 mg  4 mg Intravenous Q6H PRN Duncan, Hazel V, MD       oxyCODONE  (Oxy IR/ROXICODONE ) immediate release tablet 5 mg  5 mg Oral Q4H PRN Duncan, Hazel V, MD       sulfamethoxazole-trimethoprim (BACTRIM) 400-80 MG per tablet 1 tablet  1 tablet Oral Once per day on Monday Wednesday Friday Kandis Devaughn Sayres, MD   1 tablet at 10/16/24 1647   Treprostinil Sodium CAPS 53 mcg  53 mcg Oral QID Kandis Devaughn Sayres, MD         Discharge Medications: Please see discharge summary for a list of discharge medications.  Relevant Imaging Results:  Relevant Lab Results:   Additional Information SS#: [id number]  Lauraine JAYSON Carpen, LCSW

## 2024-10-17 NOTE — Progress Notes (Signed)
 Progress Note   Patient: [name]. FMW:[id number] DOB: [birth year] DOA: 10/12/2024     4 DOS: the patient was seen and examined on 10/17/2024    Brief hospital course: From HPI Jamani Bearce. is a 83 y.o. 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  Patient presented initially for worsening of his chronic lower extremity edema and a several day history of redness including a red rash on the dorsum of both feet.  He also noted weeping of the feet due to the increased swelling.  He tried taking an extra dose of torsemide over the past few days without improvement.  He does not have a history of CHF and most recently saw his cardiologist on 09/21/2024 when his symptoms appeared attributable to his pulmonary hypertension.  He denies chest pain or cough beyond baseline or fever or chills. Additionally, patient mentions that he was recently having right lower anterior chest pain/rib cage pain which started after someone tried to helping out of a chair from behind and might have held onto his chest too tightly.  It has since improved.   In the ED BP 143/89 with otherwise normal vitals.  O2 sat 94% on oxygen  at home flow rate. Labs notable for troponin 56 and BNP 306 CBC unremarkable CMP with minor abnormalities but for the most part unremarkable  EKG showed A-fib at 96 Chest x-ray showed cardiomegaly with chronic interstitial lung disease and small pleural effusion        Assessment and Plan:  Dyspnea on exertion Moderate pulmonary  fibrosis with moderate pulmonary hypertension s/p RHC 07/2024 Suspect symptoms related to pulmonary hypertension Elevated BNP with small pleural effusion on chest x-ray Tte appears unchanged from priors, ef 45-50 Continue IV Lasix, have increased lasix to 60 IV bid as on torsemide 40 at home, likely just one more day of IV lasix   Bilateral leg edema-chronic venous stasis Possible cellulitis bilateral lower extremities Likely chronic venous stasis worsened by possible heart failure We have not been treating with compression therapy here but he needs that, wound care consulted yesterday, their recs are in for compression therapy and I have spoken to nurse about enacting. Erythema on left leg is improving but right leg still warm and very red Rocephin > cefazolin  > keflex    Chronic respiratory failure with hypoxia (HCC) OSA on CPAP Pulmonary fibrosis with pulmonary hypertension Patient is at baseline O2 requirement of 5 L Follows with pulmonology, Dr. Parris CPAP nightly Outpatient follow-up with pulmonologist   CAD s/p CABG (coronary artery disease) Elevated troponin Troponin elevation likely related to demand ischemia.   EKG nonacute No chest pain yesterday Continue Ranexa, metoprolol , atorvastatin  and apixaban Outpatient follow-up with cardiologist   Permanent atrial fibrillation (HCC) Rate controlled Continue apixaban and dilt   Benign essential hypertension Continue home meds   DVT prophylaxis: Apixaban   Consults: none   Advance Care Planning:   Code Status: Prior    Family Communication: None at bedside   Disposition Plan: Back to previous home environment with home health, declines snf, but has questions for [facility] today about home health services  Subjective:  Reports breathing stable at baseline, legs less swollen but still red, urinating a lot  Physical Exam: Vitals and nursing note reviewed.  Constitutional:      General: He is not in acute distress. HENT:      Head: Normocephalic and atraumatic.  Cardiovascular:     Rate and Rhythm: Normal rate and regular rhythm.     Heart sounds: Normal heart sounds.  Pulmonary:     Effort: Pulmonary effort is normal.     Breath sounds: Normal breath sounds.  Abdominal:     Palpations: Abdomen is soft.     Tenderness: There is no abdominal tenderness.  Musculoskeletal: Bilateral lower extremity edema and erythema noted to bilateral lower extremities Skin Erythema and swelling b/l LEs somewhat improved from yesterday left better than right Neurological:     Mental Status: Mental status is at baseline.     Data Reviewed:    Vitals:   10/17/24 0015 10/17/24 0426 10/17/24 0428 10/17/24 0854  BP: 138/89  139/84 134/85  Pulse: 83  86 87  Resp: 16  16   Temp: (!) 97.5 F (36.4 C)  (!) 97.4 F (36.3 C) (!) 97.5 F (36.4 C)  TempSrc:    Oral  SpO2: 97%  95% 97%  Weight:  100.5 kg    Height:          Latest Ref Rng & Units 10/17/2024    4:05 AM 10/16/2024    4:32 AM 10/15/2024    5:16 AM  BMP  Glucose 70 - 99 mg/dL 96  886  899   BUN 8 - 23 mg/dL 25  25  21    Creatinine 0.61 - 1.24 mg/dL 9.02  9.07  9.19   Sodium 135 - 145 mmol/L 143  142  142   Potassium 3.5 - 5.1 mmol/L 4.2  4.0  3.3   Chloride 98 - 111 mmol/L 101  103  105   CO2 22 - 32 mmol/L 29  29  29    Calcium  8.9 - 10.3 mg/dL 8.8  8.5  8.2        Latest Ref Rng & Units 10/17/2024    4:05 AM 10/16/2024    4:32 AM 10/15/2024    5:16 AM  CBC  WBC 4.0 - 10.5 K/uL 5.7  5.8  5.9   Hemoglobin 13.0 - 17.0 g/dL 87.4  87.7  88.1   Hematocrit 39.0 - 52.0 % 38.0  37.5  36.2   Platelets 150 - 400 K/uL 162  169  170      Author: Devaughn KATHEE Ban, MD 10/17/2024 12:47 PM  For on call review [email or web address].

## 2024-10-18 DIAGNOSIS — I25118 Atherosclerotic heart disease of native coronary artery with other forms of angina pectoris: Secondary | ICD-10-CM | POA: Diagnosis not present

## 2024-10-18 DIAGNOSIS — J9611 Chronic respiratory failure with hypoxia: Secondary | ICD-10-CM | POA: Diagnosis not present

## 2024-10-18 DIAGNOSIS — E118 Type 2 diabetes mellitus with unspecified complications: Secondary | ICD-10-CM

## 2024-10-18 DIAGNOSIS — E669 Obesity, unspecified: Secondary | ICD-10-CM | POA: Diagnosis not present

## 2024-10-18 LAB — BASIC METABOLIC PANEL WITH GFR
Anion gap: 11 (ref 5–15)
BUN: 21 mg/dL (ref 8–23)
CO2: 29 mmol/L (ref 22–32)
Calcium: 8.7 mg/dL — ABNORMAL LOW (ref 8.9–10.3)
Chloride: 102 mmol/L (ref 98–111)
Creatinine, Ser: 0.9 mg/dL (ref 0.61–1.24)
GFR, Estimated: >60 mL/min (ref >60)
Glucose, Bld: 104 mg/dL — ABNORMAL HIGH (ref 70–99)
Potassium: 3.1 mmol/L — ABNORMAL LOW (ref 3.5–5.1)
Sodium: 142 mmol/L (ref 135–145)

## 2024-10-18 MED ORDER — TORSEMIDE 20 MG PO TABS
40.0000 mg | ORAL_TABLET | Freq: Every day | ORAL | Status: DC
  Administered 2024-10-19: 40 mg via ORAL
  Filled 2024-10-18: qty 2

## 2024-10-18 NOTE — Progress Notes (Signed)
 [facility]  - [place] at [facility]   PATIENT NAME: [name]    MR#:  [id number]  DATE OF BIRTH:  12/05/1941  SUBJECTIVE:      VITALS:  Blood pressure 118/77, pulse 71, temperature 97.7 F (36.5 C), temperature source Oral, resp. rate 18, height 5' 11 (1.803 m), weight 101.2 kg, SpO2 94%.  PHYSICAL EXAMINATION:   GENERAL:  83 y.o.-year-old patient with no acute distress.  LUNGS: Normal breath sounds bilaterally, no wheezing CARDIOVASCULAR: S1, S2 normal. No murmur   ABDOMEN: Soft, nontender, nondistended. Bowel sounds present.  EXTREMITIES: No  edema b/l.    NEUROLOGIC: nonfocal  patient is alert and awake SKIN: No obvious rash, lesion, or ulcer.   LABORATORY PANEL:  CBC Recent Labs  Lab 10/17/24 0405  WBC 5.7  HGB 12.5*  HCT 38.0*  PLT 162    Chemistries  Recent Labs  Lab 10/12/24 1924 10/14/24 1045 10/18/24 0542  NA 146*   < > 142  K 4.5   < > 3.1*  CL 108   < > 102  CO2 26   < > 29  GLUCOSE 103*   < > 104*  BUN 33*   < > 21  CREATININE 1.00   < > 0.90  CALCIUM  9.0   < > 8.7*  AST 42*  --   --   ALT 30  --   --   ALKPHOS 91  --   --   BILITOT 1.2  --   --    < > = values in this interval not displayed.    Assessment and Plan From HPI Tyra Michelle. is a 83 y.o. 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  Patient presented initially for worsening of his chronic lower extremity edema and a several day history of redness including a red rash on the dorsum of both feet.    Dyspnea on exertion Moderate pulmonary fibrosis with moderate pulmonary hypertension s/p RHC 07/2024 --Suspect symptoms related to pulmonary hypertension --Elevated BNP with small pleural effusion on chest x-ray --Tte appears unchanged from priors, ef 45-50 --Continue IV Lasix, have increased lasix to 60 IV bid as on torsemide 40 at home, likely just one more day of IV lasix--Torsemide from 11/13 --good uop   Bilateral leg edema-chronic venous stasis Possible cellulitis bilateral lower extremities --Likely chronic venous stasis worsened by possible heart failure ---Rocephin > cefazolin  > keflex  --bilateral LE wrapped--looking better from pictures 10/15/24 ====> today 10/18/24   Chronic respiratory failure with hypoxia (HCC) OSA on CPAP Pulmonary fibrosis with pulmonary hypertension --Patient is at baseline O2 requirement of 5 L --Follows with pulmonology, Dr. Nathaniel aware per pt request --CPAP nightly --Outpatient follow-up with pulmonologist   CAD s/p CABG (coronary artery disease) Elevated troponin --Troponin elevation likely related to demand ischemia.   EKG nonacute --No chest pain --Continue Ranexa, metoprolol , atorvastatin  and apixaban --Outpatient follow-up with cardiologist   Permanent atrial fibrillation (HCC) --Rate controlled --Continue apixaban and dilt   Benign essential hypertension --Continue home meds  Patient medically best at baseline. TOC for discharge to rehab. Awaiting insurance authorization  Procedures: Family communication :none today Consults :none CODE STATUS: full DVT Prophylaxis :eliquis Level of care: Progressive Status is: Inpatient Remains inpatient appropriate because: awaiting insurance auth. Medically best for d/c    TOTAL TIME TAKING CARE OF THIS PATIENT: 35 minutes.  >50% time spent on counselling and coordination of care  Note: This dictation was prepared with Dragon dictation along with smaller phrase  technology. Any transcriptional errors that result from this process are unintentional.  Leita Blanch M.D    [facility]   CC: Primary care physician; Fernande Ophelia JINNY DOUGLAS, MD

## 2024-10-18 NOTE — TOC Progression Note (Signed)
 Transition of Care (TOC) - Progression Note    Patient Details  Name: [name]. MRN: [id number] Date of Birth: 02-14-1941  Transition of Care [facility]) CM/SW Contact  Lauraine JAYSON Carpen, LCSW Phone Number: 10/18/2024, 10:33 AM  Clinical Narrative:  [facility] admissions coordinator will have DON review referral.   Expected Discharge Plan: Home w Home Health Services Barriers to Discharge: Continued Medical Work up               Expected Discharge Plan and Services In-house Referral: Clinical Social Work                                             Social Drivers of Health (SDOH) Interventions SDOH Screenings   Food Insecurity: No Food Insecurity (10/13/2024)  Housing: Low Risk  (10/13/2024)  Transportation Needs: No Transportation Needs (10/13/2024)  Utilities: Not At Risk (10/13/2024)  Financial Resource Strain: Low Risk  (09/14/2023)   [facility]  Social Connections: Unknown (10/13/2024)  Tobacco Use: Medium Risk (10/12/2024)    Readmission Risk Interventions     No data to display

## 2024-10-18 NOTE — TOC Progression Note (Addendum)
 Transition of Care (TOC) - Progression Note    Patient Details  Name: [name]. MRN: [id number] Date of Birth: [birth year]  Transition of Care [facility]) CM/SW Contact  Shasta DELENA Daring, RN Phone Number: 10/18/2024, 11:36 AM  Clinical Narrative:    11:37 AM Bed available at [facility] Patient aware. Started auth.    Expected Discharge Plan: Home w Home Health Services Barriers to Discharge: Continued Medical Work up               Expected Discharge Plan and Services In-house Referral: Clinical Social Work                                             Social Drivers of Health (SDOH) Interventions SDOH Screenings   Food Insecurity: No Food Insecurity (10/13/2024)  Housing: Low Risk  (10/13/2024)  Transportation Needs: No Transportation Needs (10/13/2024)  Utilities: Not At Risk (10/13/2024)  Financial Resource Strain: Low Risk  (09/14/2023)   Received from [facility]  Social Connections: Unknown (10/13/2024)  Tobacco Use: Medium Risk (10/12/2024)    Readmission Risk Interventions     No data to display

## 2024-10-18 NOTE — Progress Notes (Signed)
 Occupational Therapy Treatment Patient Details Name: [name]. MRN: [id number] DOB: June 27, 1941 Today's Date: 10/18/2024   History of present illness presented to ER secondary to LE edema; admitted for management of chronic respiratory failure secondary to CHF, pulmonary fibrosis and chronic venous stasis vs LE cellulitis to bilat LEs.   OT comments  [name] was seen for OT treatment on this date. Upon arrival to room pt in bed, eager and agreeable to tx. Pt requires bed controls to exit bed. CGA + RW sit<>stand from elevated bed and ADL t/f 80 ft + 80 ft with standing rest break, requires cues to initiate breaks. SpO2 81% on 4L with activity, resolves to 93% with seated rest and PLB. Continues to require education on ECS and adapted techniques for LB access. Pt making good progress toward goals, will continue to follow POC. Discharge recommendation remains appropriate however anticipate progress.      If plan is discharge home, recommend the following:  A little help with walking and/or transfers;A little help with bathing/dressing/bathroom;Assistance with cooking/housework;Assist for transportation;Help with stairs or ramp for entrance   Equipment Recommendations  BSC/3in1    Recommendations for Other Services      Precautions / Restrictions Precautions Precautions: Fall Recall of Precautions/Restrictions: Intact Restrictions Weight Bearing Restrictions Per Provider Order: No       Mobility Bed Mobility Overal bed mobility: Needs Assistance Bed Mobility: Supine to Sit     Supine to sit: Supervision          Transfers Overall transfer level: Needs assistance Equipment used: Rolling walker (2 wheels) Transfers: Sit to/from Stand Sit to Stand: Contact guard assist, From elevated surface                 Balance Overall balance assessment: Needs assistance Sitting-balance support: No upper extremity supported, Feet supported Sitting balance-Leahy  Scale: Good     Standing balance support: Bilateral upper extremity supported Standing balance-Leahy Scale: Fair                             ADL either performed or assessed with clinical judgement   ADL Overall ADL's : Needs assistance/impaired                                       General ADL Comments: CGA + RW for ADL t/f, requires cues for standing rest breaks      Cognition Arousal: Alert Behavior During Therapy: WFL for tasks assessed/performed Cognition: No apparent impairments                               Following commands: Intact                      Pertinent Vitals/ Pain       Pain Assessment Pain Assessment: No/denies pain   Frequency  Min 2X/week        Progress Toward Goals  OT Goals(current goals can now be found in the care plan section)  Progress towards OT goals: Progressing toward goals  ADL Goals Pt Will Perform Lower Body Dressing: with modified independence;sit to/from stand;with adaptive equipment Pt Will Transfer to Toilet: with modified independence;ambulating;bedside commode;regular height toilet Pt Will Perform Toileting - Clothing Manipulation and hygiene: with modified independence Additional ADL Goal #  1: Pt will complete all aspects of bathing, primarily in sitting, wiht mod indep utilizing learned ECS, 1/1 opportunity. Additional ADL Goal #2: Pt will verbalize plan to implement at least 2 learned energy conservation strategies during ADL/IADL at home.  Plan      Co-evaluation                 AM-PAC OT 6 Clicks Daily Activity     Outcome Measure   Help from another person eating meals?: None Help from another person taking care of personal grooming?: None Help from another person toileting, which includes using toliet, bedpan, or urinal?: A Little Help from another person bathing (including washing, rinsing, drying)?: A Little Help from another person to put on and  taking off regular upper body clothing?: None Help from another person to put on and taking off regular lower body clothing?: A Little 6 Click Score: 21    End of Session Equipment Utilized During Treatment: Rolling walker (2 wheels);Oxygen   OT Visit Diagnosis: Unsteadiness on feet (R26.81);Muscle weakness (generalized) (M62.81)   Activity Tolerance Patient tolerated treatment well   Patient Left in chair;with call bell/phone within reach   Nurse Communication Mobility status        Time: 9140-9082 OT Time Calculation (min): 18 min  Charges: OT General Charges $OT Visit: 1 Visit OT Treatments $Therapeutic Activity: 8-22 mins  Elston Slot, M.S. OTR/L  10/18/24, 9:41 AM  ascom [phone]

## 2024-10-18 NOTE — TOC Progression Note (Signed)
 Transition of Care (TOC) - Progression Note    Patient Details  Name: [name]. MRN: [id number] Date of Birth: September 17, 1941  Transition of Care [facility]) CM/SW Contact  Shasta DELENA Daring, RN Phone Number: 10/18/2024, 9:10 AM  Clinical Narrative:     Spoke with patient while his son and daughter-in-law were on speaker phone. Care for his wife has been arranged so patient is now comfortable going to SNF. Family was encouraging and agreeable to this plan. Requested he be placed at [facility] [employer] since he and a family member both worked there for many years. Advised them we would make every attempt to secure a bed there.  RNCM contacted admission director at [facility]. She said they were already aware of the patients desire for placement (per communication with the family) and asked that we keep them updated.  Auth pending   Expected Discharge Plan: Home w Home Health Services Barriers to Discharge: Continued Medical Work up               Expected Discharge Plan and Services In-house Referral: Clinical Social Work                                             Social Drivers of Health (SDOH) Interventions SDOH Screenings   Food Insecurity: No Food Insecurity (10/13/2024)  Housing: Low Risk  (10/13/2024)  Transportation Needs: No Transportation Needs (10/13/2024)  Utilities: Not At Risk (10/13/2024)  Financial Resource Strain: Low Risk  (09/14/2023)   Received from [facility]  Social Connections: Unknown (10/13/2024)  Tobacco Use: Medium Risk (10/12/2024)    Readmission Risk Interventions     No data to display

## 2024-10-18 NOTE — Progress Notes (Signed)
 Heart Failure Stewardship Pharmacy Note  PCP: Fernande Ophelia JINNY DOUGLAS, MD PCP-Cardiologist: None  HPI: [name]. is a 83 y.o. 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 ECG on admission noted AF. On admission, BNP was 306.7, HS-troponin was 56. Chest x-ray noted chronic ILD and superimposed bilateral lower lobe atelectasis with small right pleural effusion.   Pertinent cardiac history: Left atrium myxoma resection in 1998 a CABG with LIMA to LAD. Longstanding permanent AF, failed ablation and DCCV. LHC 02/2022 noted 40% left main stenosis, proximal to mid LAD 100% occluded, left circumflex 45% stenosis, RCA-40%, mid 60% and distal 40% stenosis. LIMA to LAD not injected. TTE 11/2023 noted LVEF of 58%, mild MR, moderate TR, RVSP of 63. Right heart cath 07/2024 with CI of 1.8, PVR of 5.8, mean PA 41 and wedge of 18, moderate pulmonary hypertension predominantly group 3. TTE this admission noted LVEF of 45%, moderate LVH.  Pertinent Lab Values: Creatinine  Date Value Ref Range Status  01/28/2012 0.85 0.60 - 1.30 mg/dL Final   Creatinine, Ser  Date Value Ref Range Status  10/18/2024 0.90 0.61 - 1.24 mg/dL Final   BUN  Date Value Ref Range Status  10/18/2024 21 8 - 23 mg/dL Final  97/78/7986 19 (H) 7 - 18 mg/dL Final   Potassium  Date Value Ref Range Status  10/18/2024 3.1 (L) 3.5 - 5.1 mmol/L Final  01/28/2012 4.4 3.5 - 5.1 mmol/L Final   Sodium  Date Value Ref Range Status  10/18/2024 142 135 - 145 mmol/L Final  01/28/2012 138 136 - 145 mmol/L Final   B Natriuretic Peptide  Date Value Ref Range Status  10/12/2024 306.7 (H) 0.0 - 100.0 pg/mL Final    Comment:     Performed at [facility], [address] [place], [place] [address]    Vital Signs: Temp:  [97.5 F (36.4 C)-98.8 F (37.1 C)] 98.8 F (37.1 C) (11/12 0431) Pulse Rate:  [79-90] 79 (11/12 0431) Cardiac Rhythm: Atrial fibrillation (11/11 1900) Resp:  [18-23] 18 (11/12 0431) BP: (107-141)/(73-88) 129/88 (11/12 0431) SpO2:  [94 %-97 %] 97 % (11/12 0431) Weight:  [101.2 kg (223 lb 1.7 oz)] 101.2 kg (223 lb 1.7 oz) (11/12 0454)  Intake/Output Summary (Last 24 hours) at 10/18/2024 9347 Last data filed at 10/18/2024 0448 Gross per 24 hour  Intake 100 ml  Output 2920 ml  Net -2820 ml    Current Heart Failure Medications:  Loop diuretic: furosemide 40 mg IV BID Beta-Blocker: none ACEI/ARB/ARNI: none MRA: none SGLT2i: none Other: none  Prior to admission Heart Failure Medications:  Loop diuretic: torsemide 40 mg daily Beta-Blocker: metoprolol  succinate 50 mg daily ACEI/ARB/ARNI: none MRA: none SGLT2i: none Other: Yutrepia  Assessment: 1. 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 NYHA class III symptoms.  -Symptoms: Reports feeling improved since admission, close to baseline. Reports using O2 at home when sleeping or exerting himself, remains on O2 now. Denies orthopnea. Unable to assess LEE given UNNA boots. Appetite is great. -Volume: Appears to be relatively euvolemic. Transitioned to torsemide 40 mg daily -Hemodynamics: BP is elevated. HR 100s. -BB: Metoprolol  currently held. May benefit from transition to bisoprolol  given COPD. Titrating bisoprolol may allow for stopping diltiazem . Given HFmrEF, diltiazem  is not overtly contraindicated, but not ideal. -ACEI/ARB/ARNI: Can consider adding prior to discharge if hypertensive. Entresto has the best data in patients with LVEF 40-50%. -MRA: Consider adding eplerenone 25 mg daily when Bactrim course is complete. Patient previously failed spironolactone  due to gynecomastia.  -SGLT2i: Consider adding Farxiga or Jardiance 10 mg daily  Plan: 1) Medication changes recommended at this time: -Consider transitioning from diltiazem  to bisoprolol given mildly reduced LVEF. -Consider adding Farxiga or Jardiance 10 mg daily  2) Patient assistance: -Copay for Farxiga and Jardiance are $0  3) Education: - Patient has been educated on current HF medications and potential additions to HF medication regimen - Patient verbalizes understanding that over the next few months, these medication doses may change and more medications may be added to optimize HF regimen - Patient has been educated on basic disease state pathophysiology and goals of therapy  Please do not hesitate to reach out with questions or concerns,  Jaun Bash, PharmD, CPP, BCPS, [facility] Heart Failure Pharmacist  Phone - [phone] 10/18/2024 6:52 AM

## 2024-10-19 ENCOUNTER — Other Ambulatory Visit (HOSPITAL_COMMUNITY): Payer: Self-pay

## 2024-10-19 ENCOUNTER — Telehealth (HOSPITAL_COMMUNITY): Payer: Self-pay | Admitting: Pharmacy Technician

## 2024-10-19 DIAGNOSIS — J8410 Pulmonary fibrosis, unspecified: Secondary | ICD-10-CM | POA: Diagnosis not present

## 2024-10-19 DIAGNOSIS — G4733 Obstructive sleep apnea (adult) (pediatric): Secondary | ICD-10-CM | POA: Diagnosis not present

## 2024-10-19 DIAGNOSIS — R1311 Dysphagia, oral phase: Secondary | ICD-10-CM | POA: Diagnosis not present

## 2024-10-19 DIAGNOSIS — E669 Obesity, unspecified: Secondary | ICD-10-CM | POA: Diagnosis not present

## 2024-10-19 DIAGNOSIS — E569 Vitamin deficiency, unspecified: Secondary | ICD-10-CM | POA: Diagnosis not present

## 2024-10-19 DIAGNOSIS — E039 Hypothyroidism, unspecified: Secondary | ICD-10-CM | POA: Diagnosis not present

## 2024-10-19 DIAGNOSIS — J449 Chronic obstructive pulmonary disease, unspecified: Secondary | ICD-10-CM | POA: Diagnosis not present

## 2024-10-19 DIAGNOSIS — E1169 Type 2 diabetes mellitus with other specified complication: Secondary | ICD-10-CM | POA: Diagnosis not present

## 2024-10-19 DIAGNOSIS — R2689 Other abnormalities of gait and mobility: Secondary | ICD-10-CM | POA: Diagnosis not present

## 2024-10-19 DIAGNOSIS — D649 Anemia, unspecified: Secondary | ICD-10-CM | POA: Diagnosis not present

## 2024-10-19 DIAGNOSIS — I872 Venous insufficiency (chronic) (peripheral): Secondary | ICD-10-CM | POA: Diagnosis not present

## 2024-10-19 DIAGNOSIS — J9611 Chronic respiratory failure with hypoxia: Secondary | ICD-10-CM | POA: Diagnosis not present

## 2024-10-19 DIAGNOSIS — T782XXD Anaphylactic shock, unspecified, subsequent encounter: Secondary | ICD-10-CM | POA: Diagnosis not present

## 2024-10-19 DIAGNOSIS — M109 Gout, unspecified: Secondary | ICD-10-CM | POA: Diagnosis not present

## 2024-10-19 DIAGNOSIS — R0609 Other forms of dyspnea: Secondary | ICD-10-CM | POA: Diagnosis not present

## 2024-10-19 DIAGNOSIS — I10 Essential (primary) hypertension: Secondary | ICD-10-CM | POA: Diagnosis not present

## 2024-10-19 DIAGNOSIS — R609 Edema, unspecified: Secondary | ICD-10-CM | POA: Diagnosis not present

## 2024-10-19 DIAGNOSIS — M48061 Spinal stenosis, lumbar region without neurogenic claudication: Secondary | ICD-10-CM | POA: Diagnosis not present

## 2024-10-19 DIAGNOSIS — L03119 Cellulitis of unspecified part of limb: Secondary | ICD-10-CM | POA: Diagnosis not present

## 2024-10-19 DIAGNOSIS — R41841 Cognitive communication deficit: Secondary | ICD-10-CM | POA: Diagnosis not present

## 2024-10-19 DIAGNOSIS — R0600 Dyspnea, unspecified: Secondary | ICD-10-CM | POA: Diagnosis not present

## 2024-10-19 DIAGNOSIS — I25710 Atherosclerosis of autologous vein coronary artery bypass graft(s) with unstable angina pectoris: Secondary | ICD-10-CM | POA: Diagnosis not present

## 2024-10-19 DIAGNOSIS — M6281 Muscle weakness (generalized): Secondary | ICD-10-CM | POA: Diagnosis not present

## 2024-10-19 DIAGNOSIS — F329 Major depressive disorder, single episode, unspecified: Secondary | ICD-10-CM | POA: Diagnosis not present

## 2024-10-19 DIAGNOSIS — I4891 Unspecified atrial fibrillation: Secondary | ICD-10-CM | POA: Diagnosis not present

## 2024-10-19 DIAGNOSIS — Z743 Need for continuous supervision: Secondary | ICD-10-CM | POA: Diagnosis not present

## 2024-10-19 LAB — BASIC METABOLIC PANEL WITH GFR
Anion gap: 11 (ref 5–15)
BUN: 24 mg/dL — ABNORMAL HIGH (ref 8–23)
CO2: 30 mmol/L (ref 22–32)
Calcium: 9.1 mg/dL (ref 8.9–10.3)
Chloride: 103 mmol/L (ref 98–111)
Creatinine, Ser: 0.93 mg/dL (ref 0.61–1.24)
GFR, Estimated: >60 mL/min (ref >60)
Glucose, Bld: 110 mg/dL — ABNORMAL HIGH (ref 70–99)
Potassium: 3.5 mmol/L (ref 3.5–5.1)
Sodium: 143 mmol/L (ref 135–145)

## 2024-10-19 MED ORDER — CEPHALEXIN 500 MG PO CAPS: 500.0000 mg | ORAL_CAPSULE | Freq: Two times a day (BID) | ORAL | 0 refills | Status: AC

## 2024-10-19 MED ORDER — OXYCODONE-ACETAMINOPHEN 5-325 MG PO TABS: 1.0000 | ORAL_TABLET | Freq: Three times a day (TID) | ORAL | 0 refills | Status: AC | PRN

## 2024-10-19 MED ORDER — DILTIAZEM HCL ER COATED BEADS 120 MG PO CP24: 120.0000 mg | ORAL_CAPSULE | Freq: Every day | ORAL | 0 refills | Status: DC

## 2024-10-19 NOTE — Progress Notes (Signed)
 OT Cancellation Note  Patient Details Name: [name]. MRN: [id number] DOB: May 09, 1941   Cancelled Treatment:    Reason Eval/Treat Not Completed: Patient declined, no reason specified. Pt politely declined. On hold with someone by phone waiting to pay bills. Pt also notes he was told that he should be discharging today. Will re-attempt at later date/time as appropriate.   Omolara Carol R., MPH, MS, OTR/L ascom [phone] 10/19/24, 10:59 AM

## 2024-10-19 NOTE — Progress Notes (Signed)
 Heart Failure Stewardship Pharmacy Note  PCP: Fernande Ophelia JINNY DOUGLAS, MD PCP-Cardiologist: None  HPI: [name]. is a 83 y.o. 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 ECG on admission noted AF. On admission, BNP was 306.7, HS-troponin was 56. Chest x-ray noted chronic ILD and superimposed bilateral lower lobe atelectasis with small right pleural effusion.   Pertinent cardiac history: Left atrium myxoma resection in 1998 a CABG with LIMA to LAD. Longstanding permanent AF, failed ablation and DCCV. LHC 02/2022 noted 40% left main stenosis, proximal to mid LAD 100% occluded, left circumflex 45% stenosis, RCA-40%, mid 60% and distal 40% stenosis. LIMA to LAD not injected. TTE 11/2023 noted LVEF of 58%, mild MR, moderate TR, RVSP of 63. Right heart cath 07/2024 with CI of 1.8, PVR of 5.8, mean PA 41 and wedge of 18, moderate pulmonary hypertension predominantly group 3. TTE this admission noted LVEF of 45%, moderate LVH.  Pertinent Lab Values: Creatinine  Date Value Ref Range Status  01/28/2012 0.85 0.60 - 1.30 mg/dL Final   Creatinine, Ser  Date Value Ref Range Status  10/19/2024 0.93 0.61 - 1.24 mg/dL Final   BUN  Date Value Ref Range Status  10/19/2024 24 (H) 8 - 23 mg/dL Final  97/78/7986 19 (H) 7 - 18 mg/dL Final   Potassium  Date Value Ref Range Status  10/19/2024 3.5 3.5 - 5.1 mmol/L Final  01/28/2012 4.4 3.5 - 5.1 mmol/L Final   Sodium  Date Value Ref Range Status  10/19/2024 143 135 - 145 mmol/L Final  01/28/2012 138 136 - 145 mmol/L Final   B Natriuretic Peptide  Date Value Ref Range Status  10/12/2024 306.7 (H) 0.0 - 100.0 pg/mL Final    Comment:     Performed at [facility], [address] [place], [place] [address]    Vital Signs: Temp:  [97.5 F (36.4 C)-98.2 F (36.8 C)] 98.1 F (36.7 C) (11/13 0516) Pulse Rate:  [71-89] 79 (11/13 0516) Cardiac Rhythm: Atrial fibrillation (11/12 1910) Resp:  [17-20] 20 (11/13 0516) BP: (118-135)/(68-92) 135/86 (11/13 0516) SpO2:  [93 %-97 %] 94 % (11/13 0516) Weight:  [96.8 kg (213 lb 6.5 oz)] 96.8 kg (213 lb 6.5 oz) (11/13 0500)  Intake/Output Summary (Last 24 hours) at 10/19/2024 0824 Last data filed at 10/19/2024 9482 Gross per 24 hour  Intake 720 ml  Output 700 ml  Net 20 ml    Current Heart Failure Medications:  Loop diuretic: torsemide 40 mg daily Beta-Blocker: none ACEI/ARB/ARNI: none MRA: none SGLT2i: none Other: diltiazem  120 mg daily  Prior to admission Heart Failure Medications:  Loop diuretic: torsemide 40 mg daily Beta-Blocker: metoprolol  succinate 50 mg daily ACEI/ARB/ARNI: none MRA: none SGLT2i: none Other: Yutrepia  Assessment: 1. 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 NYHA class III symptoms.  -Symptoms: Reports feeling improved since admission, close to baseline. Reports using O2 at home when sleeping or exerting himself, remains on O2 now. Denies orthopnea. Unable to assess LEE given UNNA boots. Appetite is great. -Volume: Appears to be relatively euvolemic. Transitioned to torsemide 40 mg daily -Hemodynamics: BP is elevated. HR 100s. -BB: Metoprolol  currently held. May benefit from transition  to bisoprolol given COPD. Titrating bisoprolol may allow for stopping diltiazem . Given HFmrEF, diltiazem  is not overtly contraindicated, but not ideal. -ACEI/ARB/ARNI: Can consider adding prior to discharge if hypertensive. Entresto has the best data in patients with LVEF 40-50%. -MRA: Consider adding eplerenone 25 mg daily when Bactrim course is complete. Patient previously failed  spironolactone due to gynecomastia.  -SGLT2i: Consider adding Farxiga or Jardiance 10 mg daily  Plan: 1) Medication changes recommended at this time: -Consider transitioning from diltiazem  to bisoprolol given mildly reduced LVEF. -Consider adding Farxiga or Jardiance 10 mg daily -Consider adding spironolactone 25 mg daily   2) Patient assistance: -Copay for Farxiga and Jardiance are $0  3) Education: - Patient has been educated on current HF medications and potential additions to HF medication regimen - Patient verbalizes understanding that over the next few months, these medication doses may change and more medications may be added to optimize HF regimen - Patient has been educated on basic disease state pathophysiology and goals of therapy  Please do not hesitate to reach out with questions or concerns,  Jaun Bash, PharmD, CPP, BCPS, [facility] Heart Failure Pharmacist  Phone - [phone] 10/19/2024 8:24 AM

## 2024-10-19 NOTE — Discharge Summary (Signed)
 Physician Discharge Summary  [name]. FMW:[id number] DOB: Sep 28, 1941 DOA: 10/12/2024  PCP: Fernande Ophelia JINNY DOUGLAS, MD  Admit date: 10/12/2024 Discharge date: 10/19/2024  Admitted From: home  Disposition:  SNF  Recommendations for Outpatient Follow-up:  Follow up with PCP in 1-2 weeks F/u w/ pulmon, Dr. Aleskerov, in 1-3 weeks F/u w/ cardio in 1-2 weeks   Home Health: no  Equipment/Devices:  Discharge Condition: stable  CODE STATUS: full  Diet recommendation: Heart Healthy   Brief/Interim Summary: HPI was taken from Dr. Cleatus: [name]. is a 83 y.o. 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  Patient presented initially for worsening of his chronic lower extremity edema and a several day history of redness including a red rash on the dorsum of both feet.  He also noted weeping of the feet due to the increased swelling.  He tried taking an extra dose of torsemide over the past few days without improvement.  He does not have a history of CHF and most recently saw his cardiologist on 09/21/2024 when his symptoms appeared attributable to his pulmonary hypertension.  He denies chest pain or cough beyond baseline or fever or chills. Additionally, patient mentions that he was recently having right lower anterior chest pain/rib cage pain which started after someone tried to helping out of a chair from behind and might have held onto his chest too tightly.  It has since improved.   In the ED BP 143/89 with otherwise normal vitals.  O2  sat 94% on oxygen  at home flow rate. Labs notable for troponin 56 and BNP 306 CBC unremarkable CMP with minor abnormalities but for the most part unremarkable  EKG showed A-fib at 96 Chest x-ray showed cardiomegaly with chronic interstitial lung disease and small pleural effusion-please see if report   Patient was treated with IV Lasix 40 mg and given a dose of DuoNeb as a trial   Admission requested    Discharge Diagnoses:  Principal Problem:   Dyspnea on exertion Active Problems:   Bilateral leg edema-chronic venous stasis   Chronic respiratory failure with hypoxia (HCC)   CAD s/p CABG (coronary artery disease)   Benign essential hypertension   Permanent atrial fibrillation (HCC)   Pulmonary fibrosis (HCC)   Cellulitis of both lower extremities   Obesity (BMI 30-39.9)   Controlled type 2 diabetes mellitus with complication, without long-term current use of insulin (HCC)  Moderate pulmonary fibrosis with moderate pulmonary hypertension: s/p RHC 07/2024. Has dyspnea on exertion. Given IV lasix while inpatient but restarted home dose of torsemide    Bilateral leg edema: ddx chronic venous stasis vs b/l LE cellulitits. Continue on keflex  x 7 days total. Improved   Chronic hypoxic respiratory failure: continue on supplemental oxygen   OSA: on CPAP at bedtime. F/u outpatient w/ pulmon, Dr. Parris   Hx CAD: w/ elevated troponins, likely secondary to demand ischemia. S/p CABG. Continue on statin, eliquis, dilt. Will need to f/u outpatient w/ cardio    Permanent atrial fibrillation: continue on diltiazem , eliquis   HTN: continue on diltiazem , torsemide   Discharge Instructions  Discharge Instructions  Diet - low sodium heart healthy   Complete by: As directed    Discharge instructions   Complete by: As directed    F/u w/ PCP in 1-2 weeks. F/u w/ pulmon, Dr. Parris, in 1-3 weeks. F/u w/ cardio, in 1-2 weeks   Discharge wound care:   Complete by: As directed    Daily       Comments: Cleanse B lower legs with soap and water  and dry. Apply Xeroform gauze Soila [phone]) to dorsal feet, anterior and posterior leg wounds, cover with ABD pads and secure with Kerlix roll gauze beginning right above toes and ending right below knees.  Apply 4 Coban Soila # [phone]) in same fashion for dry boot compression.   Increase activity slowly   Complete by: As directed       Allergies as of 10/19/2024       Reactions   Fish Allergy Hives   Spironolactone Other (See Comments)   gynecomastia   Hydrocodone     confusion   Tramadol  Other (See Comments)   confusion   Hydrochlorothiazide Other (See Comments)   worsens gout   Iodine  Rash   Shellfish Allergy Rash        Medication List     STOP taking these medications    ALPRAZolam 0.25 MG tablet Commonly known as: XANAX   cyclobenzaprine 5 MG tablet Commonly known as: FLEXERIL   diclofenac  Sodium 1 % Gel Commonly known as: VOLTAREN    escitalopram 5 MG tablet Commonly known as: LEXAPRO   folic acid 1 MG tablet Commonly known as: FOLVITE   lidocaine  5 % Commonly known as: Lidoderm    metoCLOPramide  10 MG tablet Commonly known as: REGLAN    metoprolol  succinate 50 MG 24 hr tablet Commonly known as: TOPROL -XL   ranolazine 500 MG 12 hr tablet Commonly known as: RANEXA       TAKE these medications    acetaminophen  650 MG CR tablet Commonly known as: TYLENOL  Take 1,300 mg by mouth every 8 (eight) hours as needed for pain.   allopurinol 100 MG tablet Commonly known as: ZYLOPRIM Take 100 mg by mouth.   apixaban 5 MG Tabs tablet Commonly known as: ELIQUIS Take 5 mg by mouth 2 (two) times daily.   atorvastatin  40 MG tablet Commonly known as: LIPITOR Take 40 mg by mouth daily.   cephALEXin  500 MG capsule Commonly known as: KEFLEX  Take 1 capsule (500 mg total) by mouth every 12 (twelve) hours for 6 days.   dexamethasone  2 MG tablet Commonly known as: DECADRON  Take 2 mg by mouth daily.    diltiazem  120 MG 24 hr capsule Commonly known as: CARDIZEM  CD Take 1 capsule (120 mg total) by mouth daily. Start taking on: October 20, 2024 What changed: when to take this   DULoxetine 60 MG capsule Commonly known as: CYMBALTA Take 60 mg by mouth daily.   EPINEPHrine  0.3 mg/0.3 mL Soaj injection Commonly known as: EPI-PEN Inject 0.3 mg into the muscle as needed for anaphylaxis.   ferrous gluconate 324 MG tablet Commonly known as: FERGON Take 270 mg by mouth daily.   fluticasone-salmeterol 250-50 MCG/ACT Aepb Commonly known as: ADVAIR Inhale 1 puff into the lungs 2 (two) times daily as needed (Asthma).   ibuprofen 200 MG tablet Commonly known as: ADVIL Take 400 mg by mouth every 6 (six) hours as needed for moderate pain (pain score 4-6).   ipratropium-albuterol  0.5-2.5 (3) MG/3ML Soln Commonly known as: DUONEB Inhale 3 mLs into the lungs every 6 (  six) hours as needed (Wheezing/SOB).   levothyroxine  25 MCG tablet Commonly known as: SYNTHROID  Take 25 mcg by mouth every morning.   methotrexate 2.5 MG tablet Commonly known as: RHEUMATREX Take 10 mg by mouth once a week. sunday   oxyCODONE-acetaminophen 5-325 MG tablet Commonly known as: PERCOCET/ROXICET Take 1 tablet by mouth every 8 (eight) hours as needed for up to 2 days for moderate pain (pain score 4-6) or severe pain (pain score 7-10). What changed: reasons to take this   sulfamethoxazole-trimethoprim 400-80 MG tablet Commonly known as: BACTRIM Take 1 tablet by mouth 3 (three) times a week. Monday, Wednesday, Friday   tirzepatide 2.5 MG/0.5ML Pen Commonly known as: MOUNJARO Inject 2.5 mg into the skin once a week.   torsemide 20 MG tablet Commonly known as: DEMADEX Take 40 mg by mouth daily.   Yutrepia 26.5 MCG Caps Generic drug: Treprostinil Sodium Take 53 mcg by mouth in the morning, at noon, in the evening, and at bedtime.               Discharge Care Instructions  (From admission, onward)            Start     Ordered   10/19/24 0000  Discharge wound care:       Comments: Daily      Comments: Cleanse B lower legs with soap and water and dry. Apply Xeroform gauze (Lawson #[id number]) to dorsal feet, anterior and posterior leg wounds, cover with ABD pads and secure with Kerlix roll gauze beginning right above toes and ending right below knees.  Apply 4 Coban (Lawson # [id number]) in same fashion for dry boot compression.   10/19/24 1324            Contact information for after-discharge care     Destination     [facility] .   Service: Skilled Nursing Contact information: [address] [place] [address] [phone]                    Allergies  Allergen Reactions   Fish Allergy Hives   Spironolactone Other (See Comments)    gynecomastia   Hydrocodone      confusion   Tramadol Other (See Comments)    confusion   Hydrochlorothiazide Other (See Comments)    worsens gout   Iodine Rash   Shellfish Allergy Rash    Consultations:    Procedures/Studies: ECHOCARDIOGRAM COMPLETE Result Date: 10/14/2024    ECHOCARDIOGRAM REPORT   Patient Name:   [name] Date of Exam: 10/13/2024 Medical Rec #:  [id number]        Height:       70.9 in Accession #:    [id number]       Weight:       229.9 lb Date of Birth:  11/18/1941         BSA:          2.235 m Patient Age:    83 years         BP:           145/89 mmHg Patient Gender: M                HR:           92  bpm. Exam Location:  [facility] Procedure: 2D Echo, Cardiac Doppler, Color Doppler and Intracardiac            Opacification Agent (Both Spectral and Color Flow  Doppler were            utilized during procedure). Indications:     CHF-Acute Diastolic I50.31  History:         Patient has no prior history of Echocardiogram examinations.                  Prior Cardiac Surgery; Arrythmias:Atrial Fibrillation.  Sonographer:     Ashley McNeely-Sloane Referring Phys:  Delayne Solian MD  Diagnosing Phys: Cara JONETTA Lovelace MD IMPRESSIONS  1. Left ventricular ejection fraction, by estimation, is 45 to 50%. The left ventricle has mildly decreased function. The left ventricle has no regional wall motion abnormalities. There is moderate left ventricular hypertrophy. Left ventricular diastolic parameters were normal.  2. Right ventricular systolic function is normal. The right ventricular size is normal.  3. Left atrial size was mildly dilated.  4. The mitral valve is normal in structure. Trivial mitral valve regurgitation.  5. The aortic valve is normal in structure. Aortic valve regurgitation is not visualized. FINDINGS  Left Ventricle: Left ventricular ejection fraction, by estimation, is 45 to 50%. The left ventricle has mildly decreased function. The left ventricle has no regional wall motion abnormalities. Definity contrast agent was given IV to delineate the left ventricular endocardial borders. Strain was performed and the global longitudinal strain is indeterminate. The left ventricular internal cavity size was normal in size. There is moderate left ventricular hypertrophy. Left ventricular diastolic parameters  were normal. Right Ventricle: The right ventricular size is normal. No increase in right ventricular wall thickness. Right ventricular systolic function is normal. Left Atrium: Left atrial size was mildly dilated. Right Atrium: Right atrial size was normal in size. Pericardium: There is no evidence of pericardial effusion. Mitral Valve: The mitral valve is normal in structure. Trivial mitral valve regurgitation. MV peak gradient, 10.4 mmHg. The mean mitral valve gradient is 4.0 mmHg. Tricuspid Valve: The tricuspid valve is normal in structure. Tricuspid valve regurgitation is trivial. Aortic Valve: The aortic valve is normal in structure. Aortic valve regurgitation is not visualized. Aortic valve mean gradient measures 7.0 mmHg. Aortic valve peak gradient measures 14.6 mmHg. Aortic valve  area, by VTI measures 1.74 cm. Pulmonic Valve: The pulmonic valve was normal in structure. Pulmonic valve regurgitation is not visualized. Aorta: The ascending aorta was not well visualized. IAS/Shunts: No atrial level shunt detected by color flow Doppler. Additional Comments: 3D was performed not requiring image post processing on an independent workstation and was indeterminate.  LEFT VENTRICLE PLAX 2D LVIDd:         4.28 cm     Diastology LVIDs:         3.09 cm     LV e' medial:    7.18 cm/s LV PW:         1.62 cm     LV E/e' medial:  20.8 LV IVS:        1.16 cm     LV e' lateral:   9.03 cm/s LVOT diam:     2.10 cm     LV E/e' lateral: 16.5 LV SV:         55 LV SV Index:   25 LVOT Area:     3.46 cm  LV Volumes (MOD) LV vol d, MOD A2C: 21.1 ml LV vol d, MOD A4C: 82.0 ml LV vol s, MOD A2C: 22.2 ml LV vol s, MOD A4C: 45.5 ml LV SV MOD A2C:     -1.1 ml LV SV MOD A4C:  82.0 ml LV SV MOD BP:      17.3 ml LEFT ATRIUM              Index        RIGHT ATRIUM           Index LA diam:        4.40 cm  1.97 cm/m   RA Area:     17.00 cm LA Vol (A2C):   129.0 ml 57.73 ml/m  RA Volume:   40.10 ml  17.94 ml/m LA Vol (A4C):   97.5 ml  43.63 ml/m LA Biplane Vol: 113.0 ml 50.57 ml/m  AORTIC VALVE                     PULMONIC VALVE AV Area (Vmax):    1.81 cm      PV Vmax:        0.94 m/s AV Area (Vmean):   1.92 cm      PV Vmean:       64.200 cm/s AV Area (VTI):     1.74 cm      PV VTI:         0.157 m AV Vmax:           191.00 cm/s   PV Peak grad:   3.5 mmHg AV Vmean:          124.000 cm/s  PV Mean grad:   2.0 mmHg AV VTI:            0.317 m       RVOT Peak grad: 2 mmHg AV Peak Grad:      14.6 mmHg AV Mean Grad:      7.0 mmHg LVOT Vmax:         100.00 cm/s LVOT Vmean:        68.700 cm/s LVOT VTI:          0.159 m LVOT/AV VTI ratio: 0.50  AORTA Ao Root diam: 3.70 cm Ao Asc diam:  3.60 cm MITRAL VALVE MV Area (PHT): 2.81 cm     SHUNTS MV Area VTI:   1.83 cm     Systemic VTI:  0.16 m MV Peak grad:  10.4 mmHg    Systemic  Diam: 2.10 cm MV Mean grad:  4.0 mmHg     Pulmonic VTI:  0.104 m MV Vmax:       1.61 m/s MV Vmean:      95.6 cm/s MV Decel Time: 270 msec MV E velocity: 149.00 cm/s Cara JONETTA Lovelace MD Electronically signed by Cara JONETTA Lovelace MD Signature Date/Time: 10/14/2024/6:46:02 PM    Final    DG Chest 1 View Result Date: 10/13/2024 CLINICAL DATA:  Lower extremity swelling and redness. EXAM: CHEST  1 VIEW COMPARISON:  September 29, 2024 FINDINGS: Multiple sternal wires and vascular clips are noted. The cardiac silhouette is mildly enlarged and unchanged in size. There is marked severity calcification of the aortic arch. Diffuse bilateral reticular interstitial opacities and peripheral fibrotic changes are seen. Mild to moderate severity superimposed atelectasis and/or infiltrate is noted within the bilateral lower lobes. There is a small right pleural effusion. No pneumothorax is identified. No acute osseous abnormality is identified. IMPRESSION: 1. Cardiomegaly with chronic interstitial lung disease and superimposed bilateral lower lobe atelectasis and/or infiltrate. 2. Small right pleural effusion. Electronically Signed   By: Suzen Dials M.D.   On: 10/13/2024 01:00   DG Ribs Bilateral W/Chest Result Date: 09/29/2024 EXAM: AP VIEW(S)  XRAY OF THE BILATERAL RIBS AND CHEST 09/29/2024 04:06:33 PM COMPARISON: 11/19/2019. CLINICAL HISTORY: Patient states that he was eating out 3 days ago, and was unable to get up from his seat and another person tried to help lift him and felt pain in his mid back. FINDINGS: BONES: No acute displaced rib fractures. Chronic right anterior rib fractures. Status post median sternotomy. There is fracture of the lowermost sternotomy wire. LUNGS AND PLEURA: There are diffuse reticular interstitial opacities within both lungs with a lower lung zone predominance compatible with chronic fibrotic interstitial lung disease, previously characterized by high-resolution CT of the chest as UIP per  consensus guidelines. No consolidation or pulmonary edema. No pleural effusion or pneumothorax. HEART AND MEDIASTINUM: Calcified aorta. No acute abnormality of the cardiac and mediastinal silhouettes. IMAGED PORTIONS OF THE ABDOMEN: Moderate colonic stool burden identified within the imaged portions of the abdomen withgaseous distention of the small bowel loops IMPRESSION: 1. No acute displaced rib fractures. 2. Chronic right anterior rib fractures, severe. 3. 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 4. Moderate diffuse colonic stool burden with gaseous distention of the small bowel. Correlate for clinical signs or symptoms of constipation. Electronically signed by: Waddell Calk MD 09/29/2024 04:41 PM EDT RP Workstation: [id number]   (Echo, Carotid, EGD, Colonoscopy, ERCP)    Subjective: Pt c/o malaise   Discharge Exam: Vitals:   10/19/24 0912 10/19/24 1137  BP: 137/85 119/74  Pulse: 88 86  Resp: (!) 22 (!) 22  Temp: 98.3 F (36.8 C) 98 F (36.7 C)  SpO2: 92% 95%   Vitals:   10/19/24 0500 10/19/24 0516 10/19/24 0912 10/19/24 1137  BP:  135/86 137/85 119/74  Pulse:  79 88 86  Resp:  20 (!) 22 (!) 22  Temp:  98.1 F (36.7 C) 98.3 F (36.8 C) 98 F (36.7 C)  TempSrc:  Oral    SpO2:  94% 92% 95%  Weight: 96.8 kg     Height:        General: Pt is alert, awake, not in acute distress Cardiovascular: irregularly irregular no rubs, no gallops Respiratory:  velcro crackles b/l  Abdominal: Soft, NT, ND, bowel sounds + Extremities:  no cyanosis, b/l LE are dressed & dressing is C/D/I    The results of significant diagnostics from this hospitalization (including imaging, microbiology, ancillary and laboratory) are listed below for reference.     Microbiology: Recent Results (from the past 240 hours)  Resp panel by RT-PCR (RSV, Flu A&B, Covid) Anterior Nasal Swab     Status: None   Collection Time: 10/13/24 12:53 AM   Specimen: Anterior Nasal Swab   Result Value Ref Range Status   SARS Coronavirus 2 by RT PCR NEGATIVE NEGATIVE Final    Comment: (NOTE) SARS-CoV-2 target nucleic acids are NOT DETECTED.  The SARS-CoV-2 RNA is generally detectable in upper respiratory specimens during the acute phase of infection. The lowest concentration of SARS-CoV-2 viral copies this assay can detect is 138 copies/mL. A negative result does not preclude SARS-Cov-2 infection and should not be used as the sole basis for treatment or other patient management decisions. A negative result may occur with  improper specimen collection/handling, submission of specimen other than nasopharyngeal swab, presence of viral mutation(s) within the areas targeted by this assay, and inadequate number of viral copies(<138 copies/mL). A negative result must be combined with clinical observations, patient history, and epidemiological information. The expected result is Negative.  Fact Sheet for Patients:  bloggercourse.com  Fact  Sheet for Healthcare Providers:  seriousbroker.it  This test is no t yet approved or cleared by the [place]  FDA and  has been authorized for detection and/or diagnosis of SARS-CoV-2 by FDA under an Emergency Use Authorization (EUA). This EUA will remain  in effect (meaning this test can be used) for the duration of the COVID-19 declaration under Section 564(b)(1) of the Act, 21 U.S.C.section 360bbb-3(b)(1), unless the authorization is terminated  or revoked sooner.       Influenza A by PCR NEGATIVE NEGATIVE Final   Influenza B by PCR NEGATIVE NEGATIVE Final    Comment: (NOTE) The Xpert Xpress SARS-CoV-2/FLU/RSV plus assay is intended as an aid in the diagnosis of influenza from Nasopharyngeal swab specimens and should not be used as a sole basis for treatment. Nasal washings and aspirates are unacceptable for Xpert Xpress SARS-CoV-2/FLU/RSV testing.  Fact Sheet for  Patients: bloggercourse.com  Fact Sheet for Healthcare Providers: seriousbroker.it  This test is not yet approved or cleared by the [place]  FDA and has been authorized for detection and/or diagnosis of SARS-CoV-2 by FDA under an Emergency Use Authorization (EUA). This EUA will remain in effect (meaning this test can be used) for the duration of the COVID-19 declaration under Section 564(b)(1) of the Act, 21 U.S.C. section 360bbb-3(b)(1), unless the authorization is terminated or revoked.     Resp Syncytial Virus by PCR NEGATIVE NEGATIVE Final    Comment: (NOTE) Fact Sheet for Patients: bloggercourse.com  Fact Sheet for Healthcare Providers: seriousbroker.it  This test is not yet approved or cleared by the [place]  FDA and has been authorized for detection and/or diagnosis of SARS-CoV-2 by FDA under an Emergency Use Authorization (EUA). This EUA will remain in effect (meaning this test can be used) for the duration of the COVID-19 declaration under Section 564(b)(1) of the Act, 21 U.S.C. section 360bbb-3(b)(1), unless the authorization is terminated or revoked.  Performed at [facility], [address] [place], [place] [address]      Labs: BNP (last 3 results) Recent Labs    10/12/24 1924  BNP 306.7*   Basic Metabolic Panel: Recent Labs  Lab 10/15/24 0516 10/16/24 0432 10/17/24 0405 10/18/24 0542 10/19/24 0511  NA 142 142 143 142 143  K 3.3* 4.0 4.2 3.1* 3.5  CL 105 103 101 102 103  CO2 29 29 29 29 30   GLUCOSE 100* 113* 96 104* 110*  BUN 21 25* 25* 21 24*  CREATININE 0.80 0.92 0.97 0.90 0.93  CALCIUM  8.2* 8.5* 8.8* 8.7* 9.1   Liver Function Tests: Recent Labs  Lab 10/12/24 1924  AST 42*  ALT 30  ALKPHOS 91  BILITOT 1.2  PROT 6.3*  ALBUMIN 3.4*   No results for input(s): LIPASE, AMYLASE in the last 168 hours. No results for  input(s): AMMONIA in the last 168 hours. CBC: Recent Labs  Lab 10/12/24 1924 10/14/24 1045 10/15/24 0516 10/16/24 0432 10/17/24 0405  WBC 6.2 6.8 5.9 5.8 5.7  NEUTROABS 4.6 5.5 4.2 4.0 3.6  HGB 13.2 12.5* 11.8* 12.2* 12.5*  HCT 41.4 36.7* 36.2* 37.5* 38.0*  MCV 104.0* 100.5* 103.7* 104.7* 103.5*  PLT 196 163 170 169 162   Cardiac Enzymes: No results for input(s): CKTOTAL, CKMB, CKMBINDEX, TROPONINI in the last 168 hours. BNP: Invalid input(s): POCBNP CBG: No results for input(s): GLUCAP in the last 168 hours. D-Dimer No results for input(s): DDIMER in the last 72 hours. Hgb A1c No results for input(s): HGBA1C in the last 72 hours. Lipid  Profile No results for input(s): CHOL, HDL, LDLCALC, TRIG, CHOLHDL, LDLDIRECT in the last 72 hours. Thyroid  function studies No results for input(s): TSH, T4TOTAL, T3FREE, THYROIDAB in the last 72 hours.  Invalid input(s): FREET3 Anemia work up No results for input(s): VITAMINB12, FOLATE, FERRITIN, TIBC, IRON, RETICCTPCT in the last 72 hours. Urinalysis No results found for: COLORURINE, APPEARANCEUR, LABSPEC, PHURINE, GLUCOSEU, HGBUR, BILIRUBINUR, KETONESUR, PROTEINUR, UROBILINOGEN, NITRITE, LEUKOCYTESUR Sepsis Labs Recent Labs  Lab 10/14/24 1045 10/15/24 0516 10/16/24 0432 10/17/24 0405  WBC 6.8 5.9 5.8 5.7   Microbiology Recent Results (from the past 240 hours)  Resp panel by RT-PCR (RSV, Flu A&B, Covid) Anterior Nasal Swab     Status: None   Collection Time: 10/13/24 12:53 AM   Specimen: Anterior Nasal Swab  Result Value Ref Range Status   SARS Coronavirus 2 by RT PCR NEGATIVE NEGATIVE Final    Comment: (NOTE) SARS-CoV-2 target nucleic acids are NOT DETECTED.  The SARS-CoV-2 RNA is generally detectable in upper respiratory specimens during the acute phase of infection. The lowest concentration of SARS-CoV-2 viral copies this assay can detect is 138  copies/mL. A negative result does not preclude SARS-Cov-2 infection and should not be used as the sole basis for treatment or other patient management decisions. A negative result may occur with  improper specimen collection/handling, submission of specimen other than nasopharyngeal swab, presence of viral mutation(s) within the areas targeted by this assay, and inadequate number of viral copies(<138 copies/mL). A negative result must be combined with clinical observations, patient history, and epidemiological information. The expected result is Negative.  Fact Sheet for Patients:  bloggercourse.com  Fact Sheet for Healthcare Providers:  seriousbroker.it  This test is no t yet approved or cleared by the [place]  FDA and  has been authorized for detection and/or diagnosis of SARS-CoV-2 by FDA under an Emergency Use Authorization (EUA). This EUA will remain  in effect (meaning this test can be used) for the duration of the COVID-19 declaration under Section 564(b)(1) of the Act, 21 U.S.C.section 360bbb-3(b)(1), unless the authorization is terminated  or revoked sooner.       Influenza A by PCR NEGATIVE NEGATIVE Final   Influenza B by PCR NEGATIVE NEGATIVE Final    Comment: (NOTE) The Xpert Xpress SARS-CoV-2/FLU/RSV plus assay is intended as an aid in the diagnosis of influenza from Nasopharyngeal swab specimens and should not be used as a sole basis for treatment. Nasal washings and aspirates are unacceptable for Xpert Xpress SARS-CoV-2/FLU/RSV testing.  Fact Sheet for Patients: bloggercourse.com  Fact Sheet for Healthcare Providers: seriousbroker.it  This test is not yet approved or cleared by the [place]  FDA and has been authorized for detection and/or diagnosis of SARS-CoV-2 by FDA under an Emergency Use Authorization (EUA). This EUA will remain in effect (meaning  this test can be used) for the duration of the COVID-19 declaration under Section 564(b)(1) of the Act, 21 U.S.C. section 360bbb-3(b)(1), unless the authorization is terminated or revoked.     Resp Syncytial Virus by PCR NEGATIVE NEGATIVE Final    Comment: (NOTE) Fact Sheet for Patients: bloggercourse.com  Fact Sheet for Healthcare Providers: seriousbroker.it  This test is not yet approved or cleared by the [place]  FDA and has been authorized for detection and/or diagnosis of SARS-CoV-2 by FDA under an Emergency Use Authorization (EUA). This EUA will remain in effect (meaning this test can be used) for the duration of the COVID-19 declaration under Section 564(b)(1) of the Act, 21 U.S.C. section 360bbb-3(b)(1), unless  the authorization is terminated or revoked.  Performed at [facility], [address] [place], [place] [address]      Time coordinating discharge: 35 minutes  SIGNED:   Anthony CHRISTELLA Pouch, MD  [facility] 10/19/2024, 1:25 PM Pager   If 7PM-7AM, please contact night-coverage [email or web address]

## 2024-10-19 NOTE — Plan of Care (Signed)
  Problem: Education: Goal: Knowledge of General Education information will improve Description: Including pain rating scale, medication(s)/side effects and non-pharmacologic comfort measures Outcome: Progressing   Problem: Health Behavior/Discharge Planning: Goal: Ability to manage health-related needs will improve Outcome: Progressing   Problem: Clinical Measurements: Goal: Ability to maintain clinical measurements within normal limits will improve Outcome: Progressing Goal: Will remain free from infection Outcome: Progressing Goal: Diagnostic test results will improve Outcome: Progressing Goal: Respiratory complications will improve Outcome: Progressing Goal: Cardiovascular complication will be avoided Outcome: Progressing   Problem: Activity: Goal: Risk for activity intolerance will decrease Outcome: Progressing   Problem: Nutrition: Goal: Adequate nutrition will be maintained Outcome: Progressing   Problem: Coping: Goal: Level of anxiety will decrease Outcome: Progressing   Problem: Elimination: Goal: Will not experience complications related to bowel motility Outcome: Progressing Goal: Will not experience complications related to urinary retention Outcome: Progressing   Problem: Pain Managment: Goal: General experience of comfort will improve and/or be controlled Outcome: Progressing   Problem: Safety: Goal: Ability to remain free from injury will improve Outcome: Progressing   Problem: Skin Integrity: Goal: Risk for impaired skin integrity will decrease Outcome: Progressing   Problem: Education: Goal: Ability to demonstrate management of disease process will improve Outcome: Progressing Goal: Ability to verbalize understanding of medication therapies will improve Outcome: Progressing Goal: Individualized Educational Video(s) Outcome: Progressing   Problem: Activity: Goal: Capacity to carry out activities will improve Outcome: Progressing    Problem: Cardiac: Goal: Ability to achieve and maintain adequate cardiopulmonary perfusion will improve Outcome: Progressing   Problem: Education: Goal: Knowledge of disease or condition will improve Outcome: Progressing Goal: Knowledge of the prescribed therapeutic regimen will improve Outcome: Progressing Goal: Individualized Educational Video(s) Outcome: Progressing   Problem: Activity: Goal: Ability to tolerate increased activity will improve Outcome: Progressing Goal: Will verbalize the importance of balancing activity with adequate rest periods Outcome: Progressing   Problem: Respiratory: Goal: Ability to maintain a clear airway will improve Outcome: Progressing Goal: Levels of oxygenation will improve Outcome: Progressing Goal: Ability to maintain adequate ventilation will improve Outcome: Progressing   Problem: Education: Goal: Knowledge of disease or condition will improve Outcome: Progressing Goal: Understanding of medication regimen will improve Outcome: Progressing Goal: Individualized Educational Video(s) Outcome: Progressing   Problem: Activity: Goal: Ability to tolerate increased activity will improve Outcome: Progressing   Problem: Cardiac: Goal: Ability to achieve and maintain adequate cardiopulmonary perfusion will improve Outcome: Progressing   Problem: Health Behavior/Discharge Planning: Goal: Ability to safely manage health-related needs after discharge will improve Outcome: Progressing

## 2024-10-19 NOTE — TOC Transition Note (Signed)
 Transition of Care [facility]) - Discharge Note   Patient Details  Name: [name]. MRN: [id number] Date of Birth: Oct 07, 1941  [facility]) CM/SW Contact:  Shasta DELENA Daring, RN Phone Number: 10/19/2024, 2:05 PM   Clinical Narrative:    Patient discharging to [facility] via [facility]. Patient son has been advised of d/c and transportation via telephone. All questions answered. Facility notified. [facility] contacted. TOC signing off.   Final next level of care: Skilled Nursing Facility Barriers to Discharge: Barriers Resolved   Patient Goals and CMS Choice Patient states their goals for this hospitalization and ongoing recovery are:: Get back home to take care of his wife          Discharge Placement PASRR number recieved: 10/18/24            Patient chooses bed at: Other - please specify in the comment section below: ([facility]) Patient to be transferred to facility by: [facility] Name of family member notified: Elspeth Bras Patient and family notified of of transfer: 10/19/24  Discharge Plan and Services Additional resources added to the After Visit Summary for   In-house Referral: Clinical Social Work                                   Social Drivers of Health (SDOH) Interventions SDOH Screenings   Food Insecurity: No Food Insecurity (10/13/2024)  Housing: Low Risk  (10/13/2024)  Transportation Needs: No Transportation Needs (10/13/2024)  Utilities: Not At Risk (10/13/2024)  Financial Resource Strain: Low Risk  (09/14/2023)   Received from [facility]  Social Connections: Unknown (10/13/2024)  Tobacco Use: Medium Risk (10/12/2024)     Readmission Risk Interventions     No data to display

## 2024-10-19 NOTE — Progress Notes (Signed)
 Cleaned/dried, applied wound care drsg as ordered in wound care orders.

## 2024-10-19 NOTE — Plan of Care (Signed)
  Problem: Education: Goal: Knowledge of General Education information will improve Description: Including pain rating scale, medication(s)/side effects and non-pharmacologic comfort measures Outcome: Adequate for Discharge   Problem: Health Behavior/Discharge Planning: Goal: Ability to manage health-related needs will improve Outcome: Adequate for Discharge   Problem: Clinical Measurements: Goal: Ability to maintain clinical measurements within normal limits will improve Outcome: Adequate for Discharge Goal: Will remain free from infection Outcome: Adequate for Discharge Goal: Diagnostic test results will improve Outcome: Adequate for Discharge Goal: Respiratory complications will improve Outcome: Adequate for Discharge Goal: Cardiovascular complication will be avoided Outcome: Adequate for Discharge   Problem: Activity: Goal: Risk for activity intolerance will decrease Outcome: Adequate for Discharge   Problem: Nutrition: Goal: Adequate nutrition will be maintained Outcome: Adequate for Discharge   Problem: Coping: Goal: Level of anxiety will decrease Outcome: Adequate for Discharge   Problem: Elimination: Goal: Will not experience complications related to bowel motility Outcome: Adequate for Discharge Goal: Will not experience complications related to urinary retention Outcome: Adequate for Discharge   Problem: Pain Managment: Goal: General experience of comfort will improve and/or be controlled Outcome: Adequate for Discharge   Problem: Safety: Goal: Ability to remain free from injury will improve Outcome: Adequate for Discharge   Problem: Skin Integrity: Goal: Risk for impaired skin integrity will decrease Outcome: Adequate for Discharge   Problem: Education: Goal: Ability to demonstrate management of disease process will improve Outcome: Adequate for Discharge Goal: Ability to verbalize understanding of medication therapies will improve Outcome: Adequate  for Discharge Goal: Individualized Educational Video(s) Outcome: Adequate for Discharge   Problem: Activity: Goal: Capacity to carry out activities will improve Outcome: Adequate for Discharge   Problem: Cardiac: Goal: Ability to achieve and maintain adequate cardiopulmonary perfusion will improve Outcome: Adequate for Discharge   Problem: Education: Goal: Knowledge of disease or condition will improve Outcome: Adequate for Discharge Goal: Knowledge of the prescribed therapeutic regimen will improve Outcome: Adequate for Discharge Goal: Individualized Educational Video(s) Outcome: Adequate for Discharge   Problem: Activity: Goal: Ability to tolerate increased activity will improve Outcome: Adequate for Discharge Goal: Will verbalize the importance of balancing activity with adequate rest periods Outcome: Adequate for Discharge   Problem: Respiratory: Goal: Ability to maintain a clear airway will improve Outcome: Adequate for Discharge Goal: Levels of oxygenation will improve Outcome: Adequate for Discharge Goal: Ability to maintain adequate ventilation will improve Outcome: Adequate for Discharge   Problem: Education: Goal: Knowledge of disease or condition will improve Outcome: Adequate for Discharge Goal: Understanding of medication regimen will improve Outcome: Adequate for Discharge Goal: Individualized Educational Video(s) Outcome: Adequate for Discharge   Problem: Activity: Goal: Ability to tolerate increased activity will improve Outcome: Adequate for Discharge   Problem: Cardiac: Goal: Ability to achieve and maintain adequate cardiopulmonary perfusion will improve Outcome: Adequate for Discharge   Problem: Health Behavior/Discharge Planning: Goal: Ability to safely manage health-related needs after discharge will improve Outcome: Adequate for Discharge

## 2024-10-19 NOTE — Progress Notes (Signed)
 Mobility Specialist - Progress Note   Pre-mobility: HR-90, SpO2-91%  During mobility: HR-98, SpO2-87% recovered to > 89% in less than 1 Min  Post-mobility: HR-90, SPO2-89%    10/19/24 1343  Mobility  Activity Ambulated with assistance;Stood at bedside  Level of Assistance Contact guard assist, steadying assist  Assistive Device Front wheel walker  Distance Ambulated (ft) 100 ft  Range of Motion/Exercises Active  Activity Response Tolerated fair  Mobility visit 1 Mobility  Mobility Specialist Start Time (ACUTE ONLY) 1230  Mobility Specialist Stop Time (ACUTE ONLY) 1255  Mobility Specialist Time Calculation (min) (ACUTE ONLY) 25 min   Pt was in the recliner on O2 @ 3.5L upon entry. Pt agreed to mobility. Pt had wraps on both legs during activity. Pt was able today to STS with a 2WW independently. Pt O2 vitals were taken throughout activity as a precaution. Pt ambulated well. Pt did need to utilize recovery breaks. Pt did become stronger in ambulation with recovery breaks. After activity pt returned to the room in bed upright with meal and needs in front of him.  [name] Mobility Specialist 10/19/24, 1:51 PM

## 2024-10-19 NOTE — Telephone Encounter (Signed)
 Patient Product/process Development Scientist completed.    The patient is insured through [employer]/ [employer]. Patient has Medicare and is not eligible for a copay card, but may be able to apply for patient assistance or Medicare RX Payment Plan (Patient Must reach out to their plan, if eligible for payment plan), if available.    Ran test claim for Eliquis  5 mg and the current 30 day co-pay is $0.00.   This test claim was processed through  [facility]- copay amounts may vary at other pharmacies due to pharmacy/plan contracts, or as the patient moves through the different stages of their insurance plan.     Reyes Sharps, CPHT Pharmacy Technician Patient Advocate Specialist Lead [facility] Patient Advocate Team Direct Number: [phone]  Fax: [phone]

## 2024-11-13 ENCOUNTER — Ambulatory Visit: Admitting: Podiatry

## 2024-12-05 ENCOUNTER — Other Ambulatory Visit: Payer: Self-pay | Admitting: Family Medicine

## 2024-12-05 ENCOUNTER — Ambulatory Visit
Admission: RE | Admit: 2024-12-05 | Discharge: 2024-12-05 | Disposition: A | Discharge status: Home / Self Care | Source: Ambulatory Visit | Attending: Family Medicine | Admitting: Family Medicine

## 2024-12-05 ENCOUNTER — Encounter: Payer: Self-pay | Admitting: Family Medicine

## 2024-12-05 DIAGNOSIS — R0789 Other chest pain: Secondary | ICD-10-CM

## 2024-12-05 DIAGNOSIS — D721 Eosinophilia, unspecified: Secondary | ICD-10-CM | POA: Insufficient documentation

## 2024-12-05 DIAGNOSIS — R0602 Shortness of breath: Secondary | ICD-10-CM

## 2024-12-07 ENCOUNTER — Encounter: Payer: Self-pay | Admitting: Emergency Medicine

## 2024-12-07 ENCOUNTER — Inpatient Hospital Stay
Admission: EM | Admit: 2024-12-07 | Discharge: 2024-12-21 | DRG: 280 | Disposition: A | Discharge status: Skilled Nursing Facility | Attending: Student | Admitting: Student

## 2024-12-07 ENCOUNTER — Emergency Department

## 2024-12-07 DIAGNOSIS — S2231XA Fracture of one rib, right side, initial encounter for closed fracture: Secondary | ICD-10-CM | POA: Diagnosis present

## 2024-12-07 DIAGNOSIS — Z885 Allergy status to narcotic agent status: Secondary | ICD-10-CM

## 2024-12-07 DIAGNOSIS — X58XXXA Exposure to other specified factors, initial encounter: Secondary | ICD-10-CM | POA: Diagnosis present

## 2024-12-07 DIAGNOSIS — E875 Hyperkalemia: Secondary | ICD-10-CM | POA: Diagnosis not present

## 2024-12-07 DIAGNOSIS — I130 Hypertensive heart and chronic kidney disease with heart failure and stage 1 through stage 4 chronic kidney disease, or unspecified chronic kidney disease: Principal | ICD-10-CM | POA: Diagnosis present

## 2024-12-07 DIAGNOSIS — R0602 Shortness of breath: Secondary | ICD-10-CM

## 2024-12-07 DIAGNOSIS — J9811 Atelectasis: Secondary | ICD-10-CM | POA: Diagnosis not present

## 2024-12-07 DIAGNOSIS — I25810 Atherosclerosis of coronary artery bypass graft(s) without angina pectoris: Secondary | ICD-10-CM | POA: Diagnosis present

## 2024-12-07 DIAGNOSIS — R7989 Other specified abnormal findings of blood chemistry: Secondary | ICD-10-CM

## 2024-12-07 DIAGNOSIS — S22060A Wedge compression fracture of T7-T8 vertebra, initial encounter for closed fracture: Secondary | ICD-10-CM | POA: Diagnosis present

## 2024-12-07 DIAGNOSIS — Z7985 Long-term (current) use of injectable non-insulin antidiabetic drugs: Secondary | ICD-10-CM

## 2024-12-07 DIAGNOSIS — Z8616 Personal history of COVID-19: Secondary | ICD-10-CM

## 2024-12-07 DIAGNOSIS — J9620 Acute and chronic respiratory failure, unspecified whether with hypoxia or hypercapnia: Secondary | ICD-10-CM | POA: Diagnosis present

## 2024-12-07 DIAGNOSIS — I878 Other specified disorders of veins: Secondary | ICD-10-CM | POA: Diagnosis present

## 2024-12-07 DIAGNOSIS — J8410 Pulmonary fibrosis, unspecified: Secondary | ICD-10-CM | POA: Diagnosis present

## 2024-12-07 DIAGNOSIS — J441 Chronic obstructive pulmonary disease with (acute) exacerbation: Secondary | ICD-10-CM | POA: Diagnosis present

## 2024-12-07 DIAGNOSIS — E873 Alkalosis: Secondary | ICD-10-CM | POA: Diagnosis present

## 2024-12-07 DIAGNOSIS — I4821 Permanent atrial fibrillation: Secondary | ICD-10-CM | POA: Diagnosis present

## 2024-12-07 DIAGNOSIS — I272 Pulmonary hypertension, unspecified: Secondary | ICD-10-CM | POA: Diagnosis present

## 2024-12-07 DIAGNOSIS — J9621 Acute and chronic respiratory failure with hypoxia: Secondary | ICD-10-CM | POA: Diagnosis not present

## 2024-12-07 DIAGNOSIS — I5043 Acute on chronic combined systolic (congestive) and diastolic (congestive) heart failure: Secondary | ICD-10-CM | POA: Diagnosis present

## 2024-12-07 DIAGNOSIS — Z7901 Long term (current) use of anticoagulants: Secondary | ICD-10-CM

## 2024-12-07 DIAGNOSIS — G4733 Obstructive sleep apnea (adult) (pediatric): Secondary | ICD-10-CM | POA: Diagnosis present

## 2024-12-07 DIAGNOSIS — Z7952 Long term (current) use of systemic steroids: Secondary | ICD-10-CM

## 2024-12-07 DIAGNOSIS — Z79890 Hormone replacement therapy (postmenopausal): Secondary | ICD-10-CM

## 2024-12-07 DIAGNOSIS — Z79899 Other long term (current) drug therapy: Secondary | ICD-10-CM

## 2024-12-07 DIAGNOSIS — I10 Essential (primary) hypertension: Secondary | ICD-10-CM | POA: Diagnosis present

## 2024-12-07 DIAGNOSIS — S2239XA Fracture of one rib, unspecified side, initial encounter for closed fracture: Secondary | ICD-10-CM

## 2024-12-07 DIAGNOSIS — Z1152 Encounter for screening for COVID-19: Secondary | ICD-10-CM

## 2024-12-07 DIAGNOSIS — E1122 Type 2 diabetes mellitus with diabetic chronic kidney disease: Secondary | ICD-10-CM | POA: Diagnosis present

## 2024-12-07 DIAGNOSIS — E785 Hyperlipidemia, unspecified: Secondary | ICD-10-CM | POA: Diagnosis present

## 2024-12-07 DIAGNOSIS — Z87891 Personal history of nicotine dependence: Secondary | ICD-10-CM

## 2024-12-07 DIAGNOSIS — Z91041 Radiographic dye allergy status: Secondary | ICD-10-CM

## 2024-12-07 DIAGNOSIS — Z888 Allergy status to other drugs, medicaments and biological substances status: Secondary | ICD-10-CM

## 2024-12-07 DIAGNOSIS — I5023 Acute on chronic systolic (congestive) heart failure: Secondary | ICD-10-CM

## 2024-12-07 DIAGNOSIS — Z8349 Family history of other endocrine, nutritional and metabolic diseases: Secondary | ICD-10-CM

## 2024-12-07 DIAGNOSIS — I2510 Atherosclerotic heart disease of native coronary artery without angina pectoris: Secondary | ICD-10-CM | POA: Diagnosis present

## 2024-12-07 DIAGNOSIS — Z91013 Allergy to seafood: Secondary | ICD-10-CM

## 2024-12-07 DIAGNOSIS — E118 Type 2 diabetes mellitus with unspecified complications: Secondary | ICD-10-CM | POA: Diagnosis present

## 2024-12-07 DIAGNOSIS — R5381 Other malaise: Secondary | ICD-10-CM

## 2024-12-07 DIAGNOSIS — J849 Interstitial pulmonary disease, unspecified: Secondary | ICD-10-CM

## 2024-12-07 DIAGNOSIS — E559 Vitamin D deficiency, unspecified: Secondary | ICD-10-CM | POA: Diagnosis present

## 2024-12-07 DIAGNOSIS — R600 Localized edema: Secondary | ICD-10-CM | POA: Diagnosis present

## 2024-12-07 DIAGNOSIS — M4854XA Collapsed vertebra, not elsewhere classified, thoracic region, initial encounter for fracture: Secondary | ICD-10-CM

## 2024-12-07 DIAGNOSIS — I21A1 Myocardial infarction type 2: Secondary | ICD-10-CM | POA: Diagnosis present

## 2024-12-07 DIAGNOSIS — E1151 Type 2 diabetes mellitus with diabetic peripheral angiopathy without gangrene: Secondary | ICD-10-CM | POA: Diagnosis present

## 2024-12-07 DIAGNOSIS — Z7951 Long term (current) use of inhaled steroids: Secondary | ICD-10-CM

## 2024-12-07 DIAGNOSIS — N189 Chronic kidney disease, unspecified: Secondary | ICD-10-CM | POA: Diagnosis present

## 2024-12-07 DIAGNOSIS — R0600 Dyspnea, unspecified: Secondary | ICD-10-CM | POA: Diagnosis present

## 2024-12-07 LAB — COMPREHENSIVE METABOLIC PANEL WITH GFR
ALT: 24 U/L (ref 0–44)
AST: 34 U/L (ref 15–41)
Albumin: 3.8 g/dL (ref 3.5–5.0)
Alkaline Phosphatase: 105 U/L (ref 38–126)
Anion gap: 14 (ref 5–15)
BUN: 32 mg/dL — ABNORMAL HIGH (ref 8–23)
CO2: 24 mmol/L (ref 22–32)
Calcium: 8.8 mg/dL — ABNORMAL LOW (ref 8.9–10.3)
Chloride: 107 mmol/L (ref 98–111)
Creatinine, Ser: 1.04 mg/dL (ref 0.61–1.24)
GFR, Estimated: >60 mL/min
Glucose, Bld: 114 mg/dL — ABNORMAL HIGH (ref 70–99)
Potassium: 4.5 mmol/L (ref 3.5–5.1)
Sodium: 145 mmol/L (ref 135–145)
Total Bilirubin: 0.7 mg/dL (ref 0.0–1.2)
Total Protein: 5.9 g/dL — ABNORMAL LOW (ref 6.5–8.1)

## 2024-12-07 LAB — BLOOD GAS, VENOUS
Acid-Base Excess: 1 mmol/L (ref 0.0–2.0)
Bicarbonate: 26 mmol/L (ref 20.0–28.0)
O2 Saturation: 85 %
Patient temperature: 37
pCO2, Ven: 42 mmHg — ABNORMAL LOW (ref 44–60)
pH, Ven: 7.4 (ref 7.25–7.43)
pO2, Ven: 56 mmHg — ABNORMAL HIGH (ref 32–45)

## 2024-12-07 LAB — CBC WITH DIFFERENTIAL/PLATELET
Abs Immature Granulocytes: 0.14 K/uL — ABNORMAL HIGH (ref 0.00–0.07)
Basophils Absolute: 0 K/uL (ref 0.0–0.1)
Basophils Relative: 0 %
Eosinophils Absolute: 0 K/uL (ref 0.0–0.5)
Eosinophils Relative: 0 %
HCT: 38.8 % — ABNORMAL LOW (ref 39.0–52.0)
Hemoglobin: 12.7 g/dL — ABNORMAL LOW (ref 13.0–17.0)
Immature Granulocytes: 1 %
Lymphocytes Relative: 3 %
Lymphs Abs: 0.3 K/uL — ABNORMAL LOW (ref 0.7–4.0)
MCH: 34.3 pg — ABNORMAL HIGH (ref 26.0–34.0)
MCHC: 32.7 g/dL (ref 30.0–36.0)
MCV: 104.9 fL — ABNORMAL HIGH (ref 80.0–100.0)
Monocytes Absolute: 0.4 K/uL (ref 0.1–1.0)
Monocytes Relative: 3 %
Neutro Abs: 12 K/uL — ABNORMAL HIGH (ref 1.7–7.7)
Neutrophils Relative %: 93 %
Platelets: 162 K/uL (ref 150–400)
RBC: 3.7 MIL/uL — ABNORMAL LOW (ref 4.22–5.81)
RDW: 15.8 % — ABNORMAL HIGH (ref 11.5–15.5)
WBC: 12.8 K/uL — ABNORMAL HIGH (ref 4.0–10.5)
nRBC: 0 % (ref 0.0–0.2)

## 2024-12-07 LAB — CBG MONITORING, ED: Glucose-Capillary: 205 mg/dL — ABNORMAL HIGH (ref 70–99)

## 2024-12-07 LAB — RESP PANEL BY RT-PCR (RSV, FLU A&B, COVID)  RVPGX2
Influenza A by PCR: NEGATIVE
Influenza B by PCR: NEGATIVE
Resp Syncytial Virus by PCR: NEGATIVE
SARS Coronavirus 2 by RT PCR: NEGATIVE

## 2024-12-07 LAB — PRO BRAIN NATRIURETIC PEPTIDE: Pro Brain Natriuretic Peptide: 2316 pg/mL — ABNORMAL HIGH

## 2024-12-07 LAB — TROPONIN T, HIGH SENSITIVITY: Troponin T High Sensitivity: 59 ng/L — ABNORMAL HIGH (ref 0–19)

## 2024-12-07 MED ORDER — ONDANSETRON HCL 4 MG PO TABS
4.0000 mg | ORAL_TABLET | Freq: Four times a day (QID) | ORAL | Status: DC | PRN
  Administered 2024-12-14: 4 mg via ORAL
  Filled 2024-12-07: qty 1

## 2024-12-07 MED ORDER — MORPHINE SULFATE (PF) 2 MG/ML IV SOLN: 2.0000 mg | INTRAVENOUS | Status: DC | PRN

## 2024-12-07 MED ORDER — METHOTREXATE SODIUM 2.5 MG PO TABS
10.0000 mg | ORAL_TABLET | ORAL | Status: DC
  Administered 2024-12-10 – 2024-12-17 (×2): 10 mg via ORAL
  Filled 2024-12-07 (×2): qty 4

## 2024-12-07 MED ORDER — PREDNISONE 20 MG PO TABS
40.0000 mg | ORAL_TABLET | Freq: Every day | ORAL | Status: AC
  Administered 2024-12-09 – 2024-12-12 (×4): 40 mg via ORAL
  Filled 2024-12-07 (×4): qty 2

## 2024-12-07 MED ORDER — ACETAMINOPHEN 325 MG PO TABS: 650.0000 mg | ORAL_TABLET | Freq: Four times a day (QID) | ORAL | Status: DC | PRN

## 2024-12-07 MED ORDER — ATORVASTATIN CALCIUM 20 MG PO TABS
40.0000 mg | ORAL_TABLET | Freq: Every day | ORAL | Status: DC
  Administered 2024-12-07 – 2024-12-20 (×14): 40 mg via ORAL
  Filled 2024-12-07 (×15): qty 2

## 2024-12-07 MED ORDER — DULOXETINE HCL 30 MG PO CPEP
60.0000 mg | ORAL_CAPSULE | Freq: Every day | ORAL | Status: DC
  Administered 2024-12-08 – 2024-12-21 (×14): 60 mg via ORAL
  Filled 2024-12-07 (×10): qty 2
  Filled 2024-12-07: qty 1
  Filled 2024-12-07 (×3): qty 2

## 2024-12-07 MED ORDER — ACETAMINOPHEN 650 MG RE SUPP: 650.0000 mg | Freq: Four times a day (QID) | RECTAL | Status: DC | PRN

## 2024-12-07 MED ORDER — LEVOTHYROXINE SODIUM 25 MCG PO TABS
25.0000 ug | ORAL_TABLET | Freq: Every day | ORAL | Status: DC
  Administered 2024-12-08 – 2024-12-21 (×14): 25 ug via ORAL
  Filled 2024-12-07 (×14): qty 1

## 2024-12-07 MED ORDER — GUAIFENESIN-DM 100-10 MG/5ML PO SYRP: 5.0000 mL | ORAL_SOLUTION | ORAL | Status: DC | PRN

## 2024-12-07 MED ORDER — METHYLPREDNISOLONE SODIUM SUCC 40 MG IJ SOLR
40.0000 mg | Freq: Two times a day (BID) | INTRAMUSCULAR | Status: AC
  Administered 2024-12-07 – 2024-12-08 (×2): 40 mg via INTRAVENOUS
  Filled 2024-12-07 (×2): qty 1

## 2024-12-07 MED ORDER — SULFAMETHOXAZOLE-TRIMETHOPRIM 400-80 MG PO TABS
1.0000 | ORAL_TABLET | ORAL | Status: DC
  Administered 2024-12-08 – 2024-12-20 (×6): 1 via ORAL
  Filled 2024-12-07 (×7): qty 1

## 2024-12-07 MED ORDER — IPRATROPIUM-ALBUTEROL 0.5-2.5 (3) MG/3ML IN SOLN
3.0000 mL | Freq: Four times a day (QID) | RESPIRATORY_TRACT | Status: DC
  Administered 2024-12-07 – 2024-12-08 (×5): 3 mL via RESPIRATORY_TRACT
  Filled 2024-12-07 (×5): qty 3

## 2024-12-07 MED ORDER — ALBUTEROL SULFATE (2.5 MG/3ML) 0.083% IN NEBU
2.5000 mg | INHALATION_SOLUTION | RESPIRATORY_TRACT | Status: DC | PRN
  Administered 2024-12-15 – 2024-12-19 (×10): 2.5 mg via RESPIRATORY_TRACT
  Filled 2024-12-07 (×10): qty 3

## 2024-12-07 MED ORDER — APIXABAN 5 MG PO TABS
5.0000 mg | ORAL_TABLET | Freq: Two times a day (BID) | ORAL | Status: DC
  Administered 2024-12-07 – 2024-12-21 (×28): 5 mg via ORAL
  Filled 2024-12-07 (×28): qty 1

## 2024-12-07 MED ORDER — INSULIN ASPART 100 UNIT/ML IJ SOLN
0.0000 [IU] | Freq: Every day | INTRAMUSCULAR | Status: DC
  Administered 2024-12-07: 2 [IU] via SUBCUTANEOUS
  Filled 2024-12-07: qty 2

## 2024-12-07 MED ORDER — DILTIAZEM HCL ER COATED BEADS 120 MG PO CP24
120.0000 mg | ORAL_CAPSULE | Freq: Every day | ORAL | Status: DC
  Administered 2024-12-08: 120 mg via ORAL
  Filled 2024-12-07 (×2): qty 1

## 2024-12-07 MED ORDER — FUROSEMIDE 10 MG/ML IJ SOLN
40.0000 mg | Freq: Two times a day (BID) | INTRAMUSCULAR | Status: DC
  Administered 2024-12-08: 40 mg via INTRAVENOUS
  Filled 2024-12-07: qty 4

## 2024-12-07 MED ORDER — ALLOPURINOL 100 MG PO TABS
100.0000 mg | ORAL_TABLET | Freq: Every day | ORAL | Status: DC
  Administered 2024-12-08 – 2024-12-21 (×14): 100 mg via ORAL
  Filled 2024-12-07 (×15): qty 1

## 2024-12-07 MED ORDER — FUROSEMIDE 10 MG/ML IJ SOLN
40.0000 mg | Freq: Once | INTRAMUSCULAR | Status: AC
  Administered 2024-12-07: 40 mg via INTRAVENOUS
  Filled 2024-12-07: qty 4

## 2024-12-07 MED ORDER — HYDROCODONE-ACETAMINOPHEN 5-325 MG PO TABS: 1.0000 | ORAL_TABLET | ORAL | Status: DC | PRN

## 2024-12-07 MED ORDER — ONDANSETRON HCL 4 MG/2ML IJ SOLN: 4.0000 mg | Freq: Four times a day (QID) | INTRAMUSCULAR | Status: DC | PRN

## 2024-12-07 MED ORDER — INSULIN ASPART 100 UNIT/ML IJ SOLN
0.0000 [IU] | Freq: Three times a day (TID) | INTRAMUSCULAR | Status: DC
  Administered 2024-12-08: 3 [IU] via SUBCUTANEOUS
  Administered 2024-12-08: 2 [IU] via SUBCUTANEOUS
  Administered 2024-12-08: 3 [IU] via SUBCUTANEOUS
  Administered 2024-12-09: 2 [IU] via SUBCUTANEOUS
  Administered 2024-12-09: 3 [IU] via SUBCUTANEOUS
  Administered 2024-12-09: 2 [IU] via SUBCUTANEOUS
  Administered 2024-12-10: 3 [IU] via SUBCUTANEOUS
  Administered 2024-12-10: 2 [IU] via SUBCUTANEOUS
  Administered 2024-12-11 (×2): 3 [IU] via SUBCUTANEOUS
  Administered 2024-12-12: 8 [IU] via SUBCUTANEOUS
  Administered 2024-12-12 – 2024-12-13 (×3): 2 [IU] via SUBCUTANEOUS
  Administered 2024-12-14: 3 [IU] via SUBCUTANEOUS
  Administered 2024-12-14 – 2024-12-15 (×3): 2 [IU] via SUBCUTANEOUS
  Administered 2024-12-16 (×2): 3 [IU] via SUBCUTANEOUS
  Administered 2024-12-16: 5 [IU] via SUBCUTANEOUS
  Administered 2024-12-17 (×3): 3 [IU] via SUBCUTANEOUS
  Administered 2024-12-18 (×2): 2 [IU] via SUBCUTANEOUS
  Administered 2024-12-18 – 2024-12-19 (×2): 5 [IU] via SUBCUTANEOUS
  Administered 2024-12-19: 3 [IU] via SUBCUTANEOUS
  Administered 2024-12-19: 2 [IU] via SUBCUTANEOUS
  Administered 2024-12-20 (×2): 3 [IU] via SUBCUTANEOUS
  Administered 2024-12-20 – 2024-12-21 (×3): 2 [IU] via SUBCUTANEOUS
  Filled 2024-12-07: qty 3
  Filled 2024-12-07 (×3): qty 2
  Filled 2024-12-07 (×2): qty 3
  Filled 2024-12-07: qty 2
  Filled 2024-12-07: qty 5
  Filled 2024-12-07: qty 2
  Filled 2024-12-07: qty 5
  Filled 2024-12-07: qty 3
  Filled 2024-12-07: qty 2
  Filled 2024-12-07: qty 3
  Filled 2024-12-07 (×3): qty 2
  Filled 2024-12-07: qty 5
  Filled 2024-12-07: qty 2
  Filled 2024-12-07: qty 3
  Filled 2024-12-07: qty 2
  Filled 2024-12-07 (×4): qty 3
  Filled 2024-12-07: qty 2
  Filled 2024-12-07: qty 3
  Filled 2024-12-07: qty 2
  Filled 2024-12-07 (×2): qty 3
  Filled 2024-12-07: qty 8
  Filled 2024-12-07 (×2): qty 3
  Filled 2024-12-07 (×3): qty 2

## 2024-12-07 NOTE — ED Triage Notes (Signed)
 Pt arrives on home O2 and states he has been having SOB for a few days and no relief from O2. Also reports worsening weakness where he is having trouble getting out of chair at home.

## 2024-12-07 NOTE — Assessment & Plan Note (Signed)
 CPAP intolerant, patient prefers O2 via nasal cannula at home Currently on HFNC to wean as tolerated

## 2024-12-07 NOTE — Assessment & Plan Note (Signed)
 No acute issues suspected Continue apixaban  and diltiazem 

## 2024-12-07 NOTE — ED Provider Notes (Signed)
 "  [facility] Provider Note    Event Date/Time   First MD Initiated Contact with Patient 12/07/24 1707     (approximate)   History   Chief Complaint Shortness of Breath   HPI  [name]. is a 84 y.o. 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  Patient reports that he has been dealing with increasing difficulty breathing as well as a cough for the past 2 days.  He states that the cough is nonproductive and he is not aware of any fevers.  He does complain of some sharp pain in the center of his chest, but states this has been going on constantly for the past week.  His PCP was concerned that he could be having pain due to fractured sternal wire and so did a CT scan a few days ago.  He has been increasing his oxygen  at home to help with his symptoms.  He has chronic swelling in his legs that he feels is no worse than usual.     Physical Exam   Triage Vital Signs: ED Triage Vitals  Encounter Vitals Group     BP 12/07/24 1705 (!) 153/89     Girls Systolic BP Percentile --      Girls Diastolic BP Percentile --      Boys Systolic BP Percentile --      Boys Diastolic BP Percentile --      Pulse Rate 12/07/24 1705 (!) 125     Resp 12/07/24 1705 (!) 28     Temp 12/07/24 1705 97.7 F (36.5 C)     Temp Source 12/07/24 1705 Oral     SpO2 12/07/24 1705 (!) 78 %     Weight --      Height 12/07/24 1706 5' 11 (1.803 m)     Head Circumference --      Peak Flow --      Pain Score 12/07/24 1706 0     Pain Loc --      Pain Education --      Exclude from Growth Chart --     Most recent vital signs: Vitals:   12/07/24 1713 12/07/24 1715  BP:  (!) 161/100  Pulse: 100 100  Resp: (!) 28 (!) 30  Temp:  97.7 F (36.5 C)  SpO2: 97% 100%     Constitutional: Alert and oriented. Eyes: Conjunctivae are normal. Head: Atraumatic. Nose: No congestion/rhinnorhea. Mouth/Throat: Mucous membranes are moist.  Cardiovascular: Normal rate, regular rhythm. Grossly normal heart sounds.  2+ radial pulses bilaterally. Respiratory: Tachypneic with increased work of breathing, crackles noted throughout without wheezing. Gastrointestinal: Soft and nontender. No distention. Musculoskeletal: No lower extremity tenderness, 1+ pitting edema to knees bilaterally. Neurologic:  Normal speech and language. No gross focal neurologic deficits are appreciated.    ED Results / Procedures / Treatments   Labs (all labs ordered are listed, but only abnormal results are displayed) Labs Reviewed  CBC WITH DIFFERENTIAL/PLATELET - Abnormal; Notable for the following components:      Result Value   WBC 12.8 (*)    RBC 3.70 (*)    Hemoglobin 12.7 (*)    HCT 38.8 (*)    MCV 104.9 (*)    MCH 34.3 (*)    RDW 15.8 (*)    Neutro Abs  12.0 (*)    Lymphs Abs 0.3 (*)    Abs Immature Granulocytes 0.14 (*)    All other components within normal limits  COMPREHENSIVE METABOLIC PANEL WITH GFR - Abnormal; Notable for the following components:   Glucose, Bld 114 (*)    BUN 32 (*)    Calcium  8.8 (*)    Total Protein 5.9 (*)    All other components within normal limits  PRO BRAIN NATRIURETIC PEPTIDE - Abnormal; Notable for the following components:   Pro Brain Natriuretic Peptide 2,316.0 (*)    All other components within normal limits  BLOOD GAS, VENOUS - Abnormal; Notable for the following components:   pCO2, Ven 42 (*)    pO2, Ven 56 (*)    All other components within normal limits  TROPONIN T, HIGH SENSITIVITY - Abnormal; Notable for the following components:   Troponin T High Sensitivity 59 (*)    All other components within normal limits  RESP PANEL BY RT-PCR (RSV, FLU A&B, COVID)  [id number]  TROPONIN T, HIGH SENSITIVITY     EKG  ED ECG REPORT I,  Carlin Palin, the attending physician, personally viewed and interpreted this ECG.   Date: 12/07/2024  EKG Time: 17:12  Rate: 104  Rhythm: atrial fibrillation, occasional PVC  Axis: Normal  Intervals:none  ST&T Change: None  RADIOLOGY Chest x-ray reviewed and interpreted by me with interstitial lung disease similar to previous, no focal infiltrate noted.  PROCEDURES:  Critical Care performed: Yes, see critical care procedure note(s)  .Critical Care  Performed by: Palin Carlin, MD Authorized by: Palin Carlin, MD   Critical care provider statement:    Critical care time (minutes):  30   Critical care time was exclusive of:  Separately billable procedures and treating other patients and teaching time   Critical care was necessary to treat or prevent imminent or life-threatening deterioration of the following conditions:  Respiratory failure   Critical care was time spent personally by me on the following activities:  Development of treatment plan with patient or surrogate, discussions with consultants, evaluation of patient's response to treatment, examination of patient, ordering and review of laboratory studies, ordering and review of radiographic studies, ordering and performing treatments and interventions, pulse oximetry, re-evaluation of patient's condition and review of old charts   I assumed direction of critical care for this patient from another provider in my specialty: no     Care discussed with: admitting provider      MEDICATIONS ORDERED IN ED: Medications  furosemide  (LASIX ) injection 40 mg (40 mg Intravenous Given 12/07/24 1755)     IMPRESSION / MDM / ASSESSMENT AND PLAN / ED COURSE  I reviewed the triage vital signs and the nursing notes.                              84 y.o. male with past medical history of hypertension, hyperlipidemia, diabetes, CAD, COPD, atrial fibrillation on Eliquis , pulmonary hypertension, and chronic hypoxic respiratory failure on  5 L who presents to the ED complaining of increasing difficulty breathing over the past 2 days with cough and chest pain for the past week.  Patient's presentation is most consistent with acute presentation with potential threat to life or bodily function.  Differential diagnosis includes, but is not limited to, ACS, PE, pneumonia, pneumothorax, COPD exacerbation, CHF, interstitial lung disease, anemia, electrolyte abnormality, AKI.  Patient chronically ill-appearing and in mild to moderate respiratory distress, noted  to be hypoxic on his usual 3 L nasal cannula but improved on nonrebreather.  I am concerned that pulmonary hypertension could be contributing to his presentation, will avoid BiPAP for now and patient was placed on high flow nasal cannula.  He does appear more comfortable on high flow nasal cannula, was also given dose of IV Lasix  with improvement in his work of breathing.  Chest x-ray with similar ILD to before, no focal infiltrate or pulmonary edema noted.  VBG is reassuring and additional labs without significant anemia, electrolyte abnormality, or AKI.  Troponin mildly elevated but low suspicion for ACS or PE at this time.  Case discussed with hospitalist for admission.  The patient is on the cardiac monitor to evaluate for evidence of arrhythmia and/or significant heart rate changes.      FINAL CLINICAL IMPRESSION(S) / ED DIAGNOSES   Final diagnoses:  Acute on chronic respiratory failure with hypoxia (HCC)  Shortness of breath  Interstitial lung disease (HCC)     Rx / DC Orders   ED Discharge Orders     None        Note:  This document was prepared using Dragon voice recognition software and may include unintentional dictation errors.   Willo Dunnings, MD 12/07/24 1922  "

## 2024-12-07 NOTE — Assessment & Plan Note (Signed)
 Sliding scale insulin  coverage

## 2024-12-07 NOTE — H&P (Incomplete)
 " History and Physical    Patient: [name]. FMW:[id number] DOB: [birth year] DOA: 12/07/2024 DOS: the patient was seen and examined on 12/07/2024 PCP: Fernande Ophelia JINNY DOUGLAS, MD  Patient coming from: Home  Chief Complaint:  Chief Complaint  Patient presents with   Shortness of Breath    HPI: [name]. is a 84 y.o. male with medical history significant for HTN, DM, atrial fibrillation on Eliquis , CAD s/p CABG, COPD, ILD on methotrexate/chronic steroids, OSA not on CPAP, pulmonary hypertension, and chronic hypoxic respiratory failure on 3 L nasal cannula, chronic venous stasis , Newly reduced EF( (EF 55-60-%(11/2023)-->45-50%, 10/13/24 done a month ago showing newly reduced EF) who presents with 2-day history of cough and shortness of breath associated with chest pain and coughing.  Denies fever.  Has chronic baseline lower extremity edema without pain in the legs.  He has been increasing his home oxygen  without significant improvement.  He had an outpatient CT of the chest on 12/30 due to anterior chest pain that showed stable pulmonary fibrosis with concerns for acute fracture lateral right seventh rib and acute or subacute T8 compression fracture with near complete loss of vertebral body height..  In the ED he was tachypneic to 28-30 with initial O2 sats 78% on home flow rate of 3 L for which she was placed on high flow nasal cannula.  Mildly hypertensive with SBP in the 150s to 160s but with otherwise normal vitals. Labs notable for unremarkable VBG with normal venous pH and pCO2 42.  Mild leukocytosis of 13,000, with negative COVID/flu/RSV.  Hemoglobin at baseline at 12.7.  Troponin 59 and proBNP 2316.  CMP unremarkable.    EKG showed A-fib at 104 with a few VPCs but otherwise unremarkable Chest x-ray showed chronic interstitial lung disease without interval change and Cross superimposed acute process. Patient was started on Lasix .  Cross breathing treatments given as he was not found  to be wheezing. Admission requested.     Past Medical History:  Diagnosis Date   Allergy    Arthritis    Coronary artery disease    COVID-19 11/19/2019   Dysrhythmia    Hypertension    Peripheral vascular disease    Pulmonary fibrosis (HCC)    Sleep apnea    CPAP   Tremor    Past Surgical History:  Procedure Laterality Date   CARDIAC SURGERY     CARDIOVERSION N/A 03/03/2018   Procedure: CARDIOVERSION;  Surgeon: Hester Wolm JINNY, MD;  Location: [facility];  Service: Cardiovascular;  Laterality: N/A;   CARDIOVERSION N/A 05/24/2018   Procedure: CARDIOVERSION;  Surgeon: Hester Wolm JINNY, MD;  Location: [facility];  Service: Cardiovascular;  Laterality: N/A;   CATARACT EXTRACTION W/PHACO Right 06/03/2020   Procedure: CATARACT EXTRACTION PHACO AND INTRAOCULAR LENS PLACEMENT (IOC) RIGHT 6.07  00:48.0;  Surgeon: Myrna Adine Anes, MD;  Location: [facility];  Service: Ophthalmology;  Laterality: Right;  sleep apnea   CORONARY ARTERY BYPASS GRAFT     EYE SURGERY     LEG SURGERY Left    stents placed, Fem-Fem bypass   LOWER EXTREMITY ANGIOGRAPHY Left 09/01/2022   Procedure: Lower Extremity Angiography;  Surgeon: Jama Cordella MATSU, MD;  Location: [facility];  Service: Cardiovascular;  Laterality: Left;   RIGHT HEART CATH Right 07/24/2024   Procedure: RIGHT HEART CATH;  Surgeon: Florencio Cara BIRCH, MD;  Location: [facility];  Service: Cardiovascular;  Laterality: Right;   RIGHT/LEFT HEART CATH AND CORONARY  ANGIOGRAPHY Bilateral 02/12/2022   Procedure: RIGHT/LEFT HEART CATH AND CORONARY ANGIOGRAPHY;  Surgeon: Hester Wolm PARAS, MD;  Location: [facility];  Service: Cardiovascular;  Laterality: Bilateral;   TONSILLECTOMY     Social History:  reports that he quit smoking about 45 years ago. His smoking use included cigarettes. He has never used smokeless tobacco. He reports current alcohol  use. He reports that he does not use drugs.  Allergies[1]  Family  History  Problem Relation Age of Onset   Thyroid  disease Mother    Macular degeneration Mother    Cancer Father     Prior to Admission medications  Medication Sig Start Date End Date Taking? Authorizing Provider  acetaminophen  (TYLENOL ) 650 MG CR tablet Take 1,300 mg by mouth every 8 (eight) hours as needed for pain.     [provider]  allopurinol (ZYLOPRIM) 100 MG tablet Take 100 mg by mouth. 09/13/23   [provider]  apixaban  (ELIQUIS ) 5 MG TABS tablet Take 5 mg by mouth 2 (two) times daily. 12/02/23   [provider]  atorvastatin  (LIPITOR) 40 MG tablet Take 40 mg by mouth daily. 11/26/21   [provider]  dexamethasone  (DECADRON ) 2 MG tablet Take 2 mg by mouth daily. 09/22/24   [provider]  diltiazem  (CARDIZEM  CD) 120 MG 24 hr capsule Take 1 capsule (120 mg total) by mouth daily. 10/20/24 11/19/24  Trudy Anthony HERO, MD  DULoxetine  (CYMBALTA ) 60 MG capsule Take 60 mg by mouth daily. 09/07/24 09/07/25  [provider]  EPINEPHrine  0.3 mg/0.3 mL IJ SOAJ injection Inject 0.3 mg into the muscle as needed for anaphylaxis. 03/16/24   [provider]  ferrous gluconate (FERGON) 324 MG tablet Take 270 mg by mouth daily.    [provider]  fluticasone -salmeterol (ADVAIR) 250-50 MCG/ACT AEPB Inhale 1 puff into the lungs 2 (two) times daily as needed (Asthma). 12/07/23   [provider]  ibuprofen (ADVIL) 200 MG tablet Take 400 mg by mouth every 6 (six) hours as needed for moderate pain (pain score 4-6).    [provider]  ipratropium-albuterol  (DUONEB) 0.5-2.5 (3) MG/3ML SOLN Inhale 3 mLs into the lungs every 6 (six) hours as needed (Wheezing/SOB). 05/23/24 05/18/25  [provider]  levothyroxine  (SYNTHROID ) 25 MCG tablet Take 25 mcg by mouth every morning. 12/31/21   [provider]  methotrexate (RHEUMATREX) 2.5 MG tablet Take 10 mg by mouth once a week. sunday    [provider]  sulfamethoxazole -trimethoprim  (BACTRIM ) 400-80 MG tablet Take 1 tablet by mouth 3 (three) times a week. Monday, Wednesday, Friday 09/08/24   [provider]  tirzepatide (MOUNJARO) 2.5 MG/0.5ML Pen Inject 2.5 mg into the skin once a week.    [provider]  torsemide  (DEMADEX ) 20 MG tablet Take 40 mg by mouth daily. 07/16/22   [provider]  Treprostinil  Sodium (YUTREPIA ) 26.5 MCG CAPS Take 53 mcg by mouth in the morning, at noon, in the evening, and at bedtime. 07/04/24   [provider]    Physical Exam: Vitals:   12/07/24 1706 12/07/24 1713 12/07/24 1715 12/07/24 1938  BP:   (!) 161/100 (!) 158/98  Pulse:  100 100 92  Resp:  (!) 28 (!) 30 (!) 30  Temp:   97.7 F (36.5 C) 98.1 F (36.7 C)  TempSrc:   Oral Oral  SpO2:  97% 100% 98%  Height: 5' 11 (1.803 m)      Physical Exam  Labs on  Admission: I have personally reviewed following labs and imaging studies  CBC: Recent Labs  Lab 12/07/24 1746  WBC 12.8*  NEUTROABS 12.0*  HGB 12.7*  HCT 38.8*  MCV 104.9*  PLT 162   Basic Metabolic Panel: Recent Labs  Lab 12/07/24 1746  NA 145  K 4.5  CL 107  CO2 24  GLUCOSE 114*  BUN 32*  CREATININE 1.04  CALCIUM  8.8*   GFR: CrCl cannot be calculated (Unknown ideal weight.). Liver Function Tests: Recent Labs  Lab 12/07/24 1746  AST 34  ALT 24  ALKPHOS 105  BILITOT 0.7  PROT 5.9*  ALBUMIN 3.8   Cross results for input(s): LIPASE, AMYLASE in the last 168 hours. Cross results for input(s): AMMONIA in the last 168 hours. Coagulation Profile: Cross results for input(s): INR, PROTIME in the last 168 hours. Cardiac Enzymes: Cross results for input(s): CKTOTAL, CKMB, CKMBINDEX, TROPONINI in the last 168 hours. BNP (last 3 results) Recent Labs    12/07/24 1746  PROBNP 2,316.0*   HbA1C: Cross results for input(s): HGBA1C in the last 72 hours. CBG: Cross results for input(s): GLUCAP in the last 168 hours. Lipid Profile: Cross  results for input(s): CHOL, HDL, LDLCALC, TRIG, CHOLHDL, LDLDIRECT in the last 72 hours. Thyroid  Function Tests: Cross results for input(s): TSH, T4TOTAL, FREET4, T3FREE, THYROIDAB in the last 72 hours. Anemia Panel: Cross results for input(s): VITAMINB12, FOLATE, FERRITIN, TIBC, IRON, RETICCTPCT in the last 72 hours. Urine analysis: Cross results found for: COLORURINE, APPEARANCEUR, LABSPEC, PHURINE, GLUCOSEU, HGBUR, BILIRUBINUR, KETONESUR, PROTEINUR, UROBILINOGEN, NITRITE, LEUKOCYTESUR  Radiological Exams on Admission: DG Chest Portable 1 View Result Date: 12/07/2024 CLINICAL DATA:  Shortness of breath. EXAM: PORTABLE CHEST 1 VIEW COMPARISON:  Chest CT 12/05/2024, radiograph 10/13/2024 FINDINGS: Status post median sternotomy. Chronic interstitial lung disease without interval change. Cross evidence of superimposed acute airspace disease. Cross pleural effusion or pneumothorax. Stable cardiomegaly with aortic atherosclerosis. Stable osseous structures. IMPRESSION: Chronic interstitial lung disease without interval change. Cross evidence of superimposed acute process. Electronically Signed   By: Andrea Gasman M.D.   On: 12/07/2024 18:08   Data Reviewed for HPI: Relevant notes from primary care and specialist visits, past discharge summaries as available in EHR, including Care Everywhere. Prior diagnostic testing as pertinent to current admission diagnoses Updated medications and problem lists for reconciliation ED course, including vitals, labs, imaging, treatment and response to treatment Triage notes, nursing and pharmacy notes and ED provider's notes Notable results as noted above in HPI      Assessment and Plan: * #Acute on chronic respiratory failure with hypoxia (HCC) #Acute dyspnea, likely multifactorial, new CHF/splinting from acute rib and T8 fracture on CT 12/05/2024 # Chronic pulmonary conditions: ILD/pulmonary  fibrosis/OSA/COPD/pulmonary hypertension Presents with shortness of breath, O2 sat 78% on home flow rate of 3 L, improved with HFNC Newly reduced EF without documented CHF history(last cardiology note), but BNP elevated Chest x-ray showing stable pulmonary fibrosis otherwise nonacute Continue Lasix  started in the ED for possible CHF-please see on the respective problem Continue home inhalers with DuoNebs as needed Will switch from home oral steroids to IV and assess for benefit Guaifenesin/antitussives Incentive spirometer/flutter valve Pain control to decrease splinting from rib fracture T8 fracture Continue HFNC and wean as tolerated Patient is intolerant of CPAP    Acute on chronic systolic CHF (congestive heart failure) (HCC) Chronic lower extremity edema secondary to venous stasis Patient with Cross prior documented CHF history however, newly reduced EF 55-60-%(11/2023)-->45-50%, (10/13/24) Chest x-ray nonacute but BNP  elevated to 2316 IV Lasix  Daily weights with intake and output monitoring Consider cardiology consult in the a.m. for medication optimization  Non-traumatic compression fracture of T8 thoracic vertebra, initial encounter (HCC) Right seventh rib fracture on CT chest 12/05/2024 Pain control Guaifenesin Incentive spirometer Supplemental oxygen  Consider neurosurgical consult Will order TLSO brace  Elevated troponin CAD with history of CABG Suspect demand ischemia.  BNP 2316.  EKG nonacute Continue to trend Continue apixaban  and atorvastatin   Permanent atrial fibrillation (HCC) Cross acute issues suspected Continue apixaban  and diltiazem   Controlled type 2 diabetes mellitus with complication, without long-term current use of insulin (HCC) Sliding scale insulin coverage  OSA (obstructive sleep apnea) CPAP intolerant, patient prefers O2 via nasal cannula at home Currently on HFNC to wean as tolerated  Benign essential hypertension BP slightly elevated Continue  home meds of diltiazem      DVT prophylaxis: Eliquis   Consults: none  Advance Care Planning:   Code Status: Prior   Family Communication: none  Disposition Plan: Back to previous home environment  Severity of Illness: The appropriate patient status for this patient is OBSERVATION. Observation status is judged to be reasonable and necessary in order to provide the required intensity of service to ensure the patient's safety. The patient's presenting symptoms, physical exam findings, and initial radiographic and laboratory data in the context of their medical condition is felt to place them at decreased risk for further clinical deterioration. Furthermore, it is anticipated that the patient will be medically stable for discharge from the hospital within 2 midnights of admission.   Author: Delayne LULLA Solian, MD 12/07/2024 8:25 PM  For on call review [email or web address].      [1]  Allergies Allergen Reactions   Fish Allergy Hives   Spironolactone Other (See Comments)    gynecomastia   Hydrocodone       confusion   Tramadol  Other (See Comments)    confusion   Hydrochlorothiazide Other (See Comments)    worsens gout   Iodine  Rash   Shellfish Allergy Rash   "

## 2024-12-07 NOTE — Assessment & Plan Note (Addendum)
#  Acute dyspnea, likely multifactorial, new CHF/splinting from acute rib and T8 fracture on CT 12/05/2024 # Chronic pulmonary conditions: ILD/pulmonary fibrosis/OSA/COPD/pulmonary hypertension Presents with shortness of breath, O2 sat 78% on home flow rate of 3 L, improved with HFNC Newly reduced EF without documented CHF history(last cardiology note), but BNP elevated Chest x-ray showing stable pulmonary fibrosis otherwise nonacute Continue Lasix  started in the ED for possible CHF-please see on the respective problem Continue home inhalers with DuoNebs as needed Will switch from home oral steroids to IV and assess for benefit Guaifenesin/antitussives Incentive spirometer/flutter valve Pain control to decrease splinting from rib fracture T8 fracture Continue HFNC and wean as tolerated Patient is intolerant of CPAP

## 2024-12-07 NOTE — Assessment & Plan Note (Addendum)
 CAD with history of CABG Suspect demand ischemia.  BNP 2316.  EKG nonacute Continue to trend Continue apixaban  and atorvastatin 

## 2024-12-07 NOTE — Assessment & Plan Note (Addendum)
 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 for medication optimization

## 2024-12-07 NOTE — Assessment & Plan Note (Deleted)
 No CHF history (EF 58% 11/2023, mild MR and TR) Will update echo and BNP was 2316 Continue Lasix  Daily weights with intake and output monitoring

## 2024-12-07 NOTE — Assessment & Plan Note (Deleted)
 Right seventh rib fracture on CT chest 12/05/2024 Pain control Guaifenesin Incentive spirometer Supplemental oxygen  Consider neurosurgical consult Will order TLSO brace

## 2024-12-07 NOTE — Assessment & Plan Note (Signed)
 BP slightly elevated Continue home meds of diltiazem 

## 2024-12-08 ENCOUNTER — Other Ambulatory Visit: Payer: Self-pay

## 2024-12-08 ENCOUNTER — Observation Stay
Admit: 2024-12-08 | Discharge: 2024-12-08 | Disposition: A | Discharge status: Home / Self Care | Attending: Internal Medicine | Admitting: Internal Medicine

## 2024-12-08 DIAGNOSIS — I21A1 Myocardial infarction type 2: Secondary | ICD-10-CM | POA: Diagnosis present

## 2024-12-08 DIAGNOSIS — G4733 Obstructive sleep apnea (adult) (pediatric): Secondary | ICD-10-CM | POA: Diagnosis present

## 2024-12-08 DIAGNOSIS — S2231XA Fracture of one rib, right side, initial encounter for closed fracture: Secondary | ICD-10-CM | POA: Diagnosis present

## 2024-12-08 DIAGNOSIS — S22060A Wedge compression fracture of T7-T8 vertebra, initial encounter for closed fracture: Secondary | ICD-10-CM | POA: Diagnosis present

## 2024-12-08 DIAGNOSIS — E873 Alkalosis: Secondary | ICD-10-CM | POA: Diagnosis present

## 2024-12-08 DIAGNOSIS — X58XXXA Exposure to other specified factors, initial encounter: Secondary | ICD-10-CM | POA: Diagnosis present

## 2024-12-08 DIAGNOSIS — N189 Chronic kidney disease, unspecified: Secondary | ICD-10-CM | POA: Diagnosis present

## 2024-12-08 DIAGNOSIS — E1122 Type 2 diabetes mellitus with diabetic chronic kidney disease: Secondary | ICD-10-CM | POA: Diagnosis present

## 2024-12-08 DIAGNOSIS — Z7951 Long term (current) use of inhaled steroids: Secondary | ICD-10-CM | POA: Diagnosis not present

## 2024-12-08 DIAGNOSIS — E785 Hyperlipidemia, unspecified: Secondary | ICD-10-CM | POA: Diagnosis present

## 2024-12-08 DIAGNOSIS — J9621 Acute and chronic respiratory failure with hypoxia: Secondary | ICD-10-CM | POA: Diagnosis present

## 2024-12-08 DIAGNOSIS — I25810 Atherosclerosis of coronary artery bypass graft(s) without angina pectoris: Secondary | ICD-10-CM | POA: Diagnosis present

## 2024-12-08 DIAGNOSIS — Z8616 Personal history of COVID-19: Secondary | ICD-10-CM | POA: Diagnosis not present

## 2024-12-08 DIAGNOSIS — R5381 Other malaise: Secondary | ICD-10-CM

## 2024-12-08 DIAGNOSIS — J9811 Atelectasis: Secondary | ICD-10-CM | POA: Diagnosis not present

## 2024-12-08 DIAGNOSIS — I5043 Acute on chronic combined systolic (congestive) and diastolic (congestive) heart failure: Secondary | ICD-10-CM | POA: Diagnosis present

## 2024-12-08 DIAGNOSIS — J9620 Acute and chronic respiratory failure, unspecified whether with hypoxia or hypercapnia: Secondary | ICD-10-CM | POA: Diagnosis present

## 2024-12-08 DIAGNOSIS — I4821 Permanent atrial fibrillation: Secondary | ICD-10-CM | POA: Diagnosis present

## 2024-12-08 DIAGNOSIS — I2510 Atherosclerotic heart disease of native coronary artery without angina pectoris: Secondary | ICD-10-CM | POA: Diagnosis present

## 2024-12-08 DIAGNOSIS — Z7901 Long term (current) use of anticoagulants: Secondary | ICD-10-CM | POA: Diagnosis not present

## 2024-12-08 DIAGNOSIS — E875 Hyperkalemia: Secondary | ICD-10-CM | POA: Diagnosis not present

## 2024-12-08 DIAGNOSIS — E1151 Type 2 diabetes mellitus with diabetic peripheral angiopathy without gangrene: Secondary | ICD-10-CM | POA: Diagnosis present

## 2024-12-08 DIAGNOSIS — J441 Chronic obstructive pulmonary disease with (acute) exacerbation: Secondary | ICD-10-CM | POA: Diagnosis present

## 2024-12-08 DIAGNOSIS — I130 Hypertensive heart and chronic kidney disease with heart failure and stage 1 through stage 4 chronic kidney disease, or unspecified chronic kidney disease: Secondary | ICD-10-CM | POA: Diagnosis present

## 2024-12-08 DIAGNOSIS — Z1152 Encounter for screening for COVID-19: Secondary | ICD-10-CM | POA: Diagnosis not present

## 2024-12-08 DIAGNOSIS — R0602 Shortness of breath: Secondary | ICD-10-CM | POA: Diagnosis present

## 2024-12-08 DIAGNOSIS — J8410 Pulmonary fibrosis, unspecified: Secondary | ICD-10-CM | POA: Diagnosis present

## 2024-12-08 DIAGNOSIS — I272 Pulmonary hypertension, unspecified: Secondary | ICD-10-CM | POA: Diagnosis present

## 2024-12-08 LAB — BASIC METABOLIC PANEL WITH GFR
Anion gap: 14 (ref 5–15)
BUN: 31 mg/dL — ABNORMAL HIGH (ref 8–23)
CO2: 22 mmol/L (ref 22–32)
Calcium: 8.8 mg/dL — ABNORMAL LOW (ref 8.9–10.3)
Chloride: 107 mmol/L (ref 98–111)
Creatinine, Ser: 0.98 mg/dL (ref 0.61–1.24)
GFR, Estimated: >60 mL/min
Glucose, Bld: 148 mg/dL — ABNORMAL HIGH (ref 70–99)
Potassium: 3.5 mmol/L (ref 3.5–5.1)
Sodium: 143 mmol/L (ref 135–145)

## 2024-12-08 LAB — GLUCOSE, CAPILLARY
Glucose-Capillary: 184 mg/dL — ABNORMAL HIGH (ref 70–99)
Glucose-Capillary: 165 mg/dL — ABNORMAL HIGH (ref 70–99)

## 2024-12-08 LAB — CBC
HCT: 38 % — ABNORMAL LOW (ref 39.0–52.0)
Hemoglobin: 12.5 g/dL — ABNORMAL LOW (ref 13.0–17.0)
MCH: 34.5 pg — ABNORMAL HIGH (ref 26.0–34.0)
MCHC: 32.9 g/dL (ref 30.0–36.0)
MCV: 105 fL — ABNORMAL HIGH (ref 80.0–100.0)
Platelets: 160 K/uL (ref 150–400)
RBC: 3.62 MIL/uL — ABNORMAL LOW (ref 4.22–5.81)
RDW: 15.7 % — ABNORMAL HIGH (ref 11.5–15.5)
WBC: 12.5 K/uL — ABNORMAL HIGH (ref 4.0–10.5)
nRBC: 0 % (ref 0.0–0.2)

## 2024-12-08 LAB — ECHOCARDIOGRAM COMPLETE
AR max vel: 1.9 cm2
AV Area VTI: 2.4 cm2
AV Area mean vel: 1.99 cm2
AV Mean grad: 6 mmHg
AV Peak grad: 10.2 mmHg
Ao pk vel: 1.6 m/s
Area-P 1/2: 3.72 cm2
Calc EF: 65 %
Height: 71 in
MV VTI: 2.24 cm2
S' Lateral: 2.9 cm
Single Plane A2C EF: 54.9 %
Single Plane A4C EF: 70.2 %
Weight: 3600 [oz_av]

## 2024-12-08 LAB — CBG MONITORING, ED
Glucose-Capillary: 150 mg/dL — ABNORMAL HIGH (ref 70–99)
Glucose-Capillary: 170 mg/dL — ABNORMAL HIGH (ref 70–99)

## 2024-12-08 LAB — CK: Total CK: 110 U/L (ref 49–397)

## 2024-12-08 LAB — HEMOGLOBIN A1C
Hgb A1c MFr Bld: 5.9 % — ABNORMAL HIGH (ref 4.8–5.6)
Mean Plasma Glucose: 122.63 mg/dL

## 2024-12-08 LAB — SEDIMENTATION RATE: Sed Rate: 10 mm/h (ref 0–20)

## 2024-12-08 LAB — TROPONIN T, HIGH SENSITIVITY: Troponin T High Sensitivity: 55 ng/L — ABNORMAL HIGH (ref 0–19)

## 2024-12-08 MED ORDER — FUROSEMIDE 10 MG/ML IJ SOLN
40.0000 mg | Freq: Two times a day (BID) | INTRAMUSCULAR | Status: DC
  Administered 2024-12-08 – 2024-12-14 (×12): 40 mg via INTRAVENOUS
  Filled 2024-12-08 (×12): qty 4

## 2024-12-08 MED ORDER — PERFLUTREN LIPID MICROSPHERE
1.0000 mL | INTRAVENOUS | Status: AC | PRN
  Administered 2024-12-08: 2 mL via INTRAVENOUS

## 2024-12-08 MED ORDER — HYDRALAZINE HCL 20 MG/ML IJ SOLN
10.0000 mg | Freq: Four times a day (QID) | INTRAMUSCULAR | Status: DC | PRN
  Administered 2024-12-08: 10 mg via INTRAVENOUS
  Filled 2024-12-08: qty 1

## 2024-12-08 MED ORDER — ENSURE PLUS HIGH PROTEIN PO LIQD
237.0000 mL | Freq: Two times a day (BID) | ORAL | Status: DC
  Administered 2024-12-08 – 2024-12-19 (×15): 237 mL via ORAL

## 2024-12-08 MED ORDER — DILTIAZEM HCL ER COATED BEADS 240 MG PO CP24
240.0000 mg | ORAL_CAPSULE | Freq: Every day | ORAL | Status: DC
  Administered 2024-12-08 – 2024-12-21 (×14): 240 mg via ORAL
  Filled 2024-12-08 (×4): qty 1
  Filled 2024-12-08: qty 2
  Filled 2024-12-08 (×7): qty 1
  Filled 2024-12-08: qty 2
  Filled 2024-12-08: qty 1

## 2024-12-08 NOTE — Evaluation (Signed)
 Occupational Therapy Evaluation Patient Details Name: [name]. MRN: [id number] DOB: [birth year] Today's Date: 12/08/2024   History of Present Illness   Pt is an 84 y.o. 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  MD assessment includes: acute on chronic respiratory failure with hypoxia, right seventh rib and T8 fracture on CT 12/05/2024, acute on chronic systolic CHF, and physical deconditioning.   Clinical Impressions [name] was seen for OT evaluation this date. Prior to hospital admission, pt was MOD I using 4WW. Pt lives with spouse in home c 3 STE. Pt currently requires MIN A log rolling sup<>sit. CGA + RW for ADL t/f ~10 ft, limited by activity tolerance. SpO2 84% on 6L [place] with activity, 95% at rest. Pt would benefit from skilled OT to address noted impairments and functional limitations (see below for any additional details). Upon hospital discharge, recommend OT follow up.     If plan is discharge home, recommend the following:   Help with stairs or ramp for entrance     Functional Status Assessment   Patient has had a recent decline in their functional status and demonstrates the ability to make significant improvements in function in a reasonable and predictable amount of time.     Equipment Recommendations   BSC/3in1     Recommendations for Other Services         Precautions/Restrictions   Precautions Precautions: Fall Recall of Precautions/Restrictions: Intact Restrictions Weight Bearing Restrictions Per Provider Order: No     Mobility Bed Mobility Overal  bed mobility: Needs Assistance Bed Mobility: Rolling, Sidelying to Sit, Sit to Sidelying Rolling: Min assist Sidelying to sit: Min assist     Sit to sidelying: Min assist      Transfers Overall transfer level: Needs assistance Equipment used: Rolling walker (2 wheels) Transfers: Sit to/from Stand Sit to Stand: Contact guard assist, +2 safety/equipment                  Balance Overall balance assessment: Needs assistance   Sitting balance-Leahy Scale: Normal     Standing balance support: Bilateral upper extremity supported Standing balance-Leahy Scale: Fair                             ADL either performed or assessed with clinical judgement   ADL Overall ADL's : Needs assistance/impaired                                       General ADL Comments: CGA + RW for ADL t/f ~10 ft, limited by activity tolerance.       Pertinent Vitals/Pain Pain Assessment Pain Assessment: No/denies pain     Extremity/Trunk Assessment Upper Extremity Assessment Upper Extremity Assessment: Generalized weakness   Lower Extremity Assessment Lower Extremity Assessment: Generalized weakness       Communication Communication Communication: No apparent difficulties   Cognition Arousal: Alert Behavior During Therapy: WFL for tasks assessed/performed Cognition: No apparent impairments  Following commands: Intact       Cueing  General Comments   Cueing Techniques: Verbal cues  SpO2 84% on 6L [place], 95% at rest   Exercises     Shoulder Instructions      Home Living Family/patient expects to be discharged to:: Private residence Living Arrangements: Spouse/significant other Available Help at Discharge: Family;Available 24 hours/day Type of Home: House Home Access: Stairs to enter [employer] of Steps: 3 Entrance Stairs-Rails: Left Home Layout: One level     Bathroom Shower/Tub: Multimedia Programmer: Handicapped height     Home Equipment: Cane - single Acupuncturist (4 wheels)          Prior Functioning/Environment Prior Level of Function : Independent/Modified Independent             Mobility Comments: Mod ind amb with a rollator household distances only, no fall history ADLs Comments: Ind with ADLs with pt reporting mostly sponge bathing recently    OT Problem List: Decreased strength;Decreased range of motion;Decreased activity tolerance;Impaired balance (sitting and/or standing)   OT Treatment/Interventions: Self-care/ADL training;Therapeutic exercise;Energy conservation;DME and/or AE instruction;Therapeutic activities;Patient/family education      OT Goals(Current goals can be found in the care plan section)   Acute Rehab OT Goals Patient Stated Goal: to go home OT Goal Formulation: With patient Time For Goal Achievement: 12/22/24 Potential to Achieve Goals: Good ADL Goals Pt Will Perform Grooming: with modified independence;standing Pt Will Perform Lower Body Dressing: with modified independence;sit to/from stand Pt Will Transfer to Toilet: with modified independence;ambulating;regular height toilet   OT Frequency:  Min 2X/week    Co-evaluation PT/OT/SLP Co-Evaluation/Treatment: Yes Reason for Co-Treatment: Complexity of the patient's impairments (multi-system involvement);For patient/therapist safety PT goals addressed during session: Mobility/safety with mobility;Strengthening/ROM;Balance OT goals addressed during session: ADL's and self-care      AM-PAC OT 6 Clicks Daily Activity     Outcome Measure Help from another person eating meals?: None Help from another person taking care of personal grooming?: A Little Help from another person toileting, which includes using toliet, bedpan, or urinal?: A Little Help from another person bathing (including washing, rinsing, drying)?: A Little Help from another  person to put on and taking off regular upper body clothing?: None Help from another person to put on and taking off regular lower body clothing?: A Little 6 Click Score: 20   End of Session Equipment Utilized During Treatment: Rolling walker (2 wheels)  Activity Tolerance: Patient tolerated treatment well Patient left: in bed;with call bell/phone within reach;with bed alarm set;with nursing/sitter in room  OT Visit Diagnosis: Other abnormalities of gait and mobility (R26.89);Muscle weakness (generalized) (M62.81)                Time: 9044-8974 OT Time Calculation (min): 30 min Charges:  OT General Charges $OT Visit: 1 Visit OT Evaluation $OT Eval Low Complexity: 1 Low OT Treatments $Self Care/Home Management : 8-22 mins  Elston Slot, M.S. OTR/L  12/08/2024, 11:52 AM  ascom [phone]

## 2024-12-08 NOTE — ED Notes (Signed)
"  US  at bedside.   "

## 2024-12-08 NOTE — Progress Notes (Signed)
 Patient admitted to PCU from ED. Upon arrival, patient placed on continuous cardiac telemetry monitoring. Vital signs obtained and documented. Patient oriented to room, call bell system, and unit routines. Safety measures implemented: bed in lowest position, call bell in reach, and side rails up x3. Patient repositioned for comfort and skin protection. Initial head-to-toes assessment completed. Patient resting comfortably in bed, no acute distress noted at this time.
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
## 2024-12-08 NOTE — ED Notes (Signed)
 PT at bedside.

## 2024-12-08 NOTE — Assessment & Plan Note (Signed)
 Chronic lower extremity weakness-minimally ambulant x 2 months Patient states he is getting outpatient workup to check his muscle enzymes Will add on a CK PT OT consult
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
## 2024-12-08 NOTE — Evaluation (Signed)
 Physical Therapy Evaluation Patient Details Name: [name]. MRN: [id number] DOB: 1941/10/09 Today's Date: 12/08/2024  History of Present Illness  Pt is an 84 y.o. 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  MD assessment includes: acute on chronic respiratory failure with hypoxia, right seventh rib and T8 fracture on CT 12/05/2024, acute on chronic systolic CHF, and physical deconditioning.   Clinical Impression  Pt was pleasant and motivated to participate during the session and put forth good effort throughout. Pt found on 6LO2/min with SpO2 in the low to mid 90s at rest and dropping to a low of 84% with ambulation, recovering quickly once back at rest with cues for PLB, nsg notified.  Pt provided training on log roll sequencing for pain control and was able to roll and go sidelying to sit but really struggled with going sit to sidelying with proper form despite cues.  Pt was able to stand and amb with a RW with good control and stability throughout.  Pt will benefit from continued PT services upon discharge to safely address deficits listed in patient problem list for decreased caregiver assistance and eventual return to PLOF.          If plan is discharge home, recommend the following: A little help with walking and/or transfers;A little help with bathing/dressing/bathroom;Assistance with cooking/housework;Help with stairs or ramp for entrance;Assist for transportation   Can travel by private vehicle        Equipment Recommendations Rolling walker (2 wheels)  Recommendations for Other  Services       Functional Status Assessment Patient has had a recent decline in their functional status and demonstrates the ability to make significant improvements in function in a reasonable and predictable amount of time.     Precautions / Restrictions Precautions Precautions: Fall Restrictions Weight Bearing Restrictions Per Provider Order: No Other Position/Activity Restrictions: Watch SpO2 and RR      Mobility  Bed Mobility Overal bed mobility: Needs Assistance Bed Mobility: Rolling, Sidelying to Sit, Sit to Sidelying Rolling: Min assist Sidelying to sit: Min assist     Sit to sidelying: Min assist General bed mobility comments: Log roll training provided for pain control with pt requiring cuing and min A for proper techinque, most notably for sit to sidelying that even with cuing pt was unable to perform correctly    Transfers Overall transfer level: Needs assistance Equipment used: Rolling walker (2 wheels) Transfers: Sit to/from Stand Sit to Stand: Supervision, +2 safety/equipment           General transfer comment: Good eccentric and concentric control and stability    Ambulation/Gait Ambulation/Gait assistance: Supervision Gait Distance (Feet): 12 Feet Assistive device: Rolling walker (2 wheels) Gait Pattern/deviations: Step-through pattern, Decreased step length - right, Decreased step length - left, Trunk flexed Gait velocity: decreased     General Gait Details: Slow cadence with short B step length but steady without LOB  Stairs            Wheelchair Mobility     Tilt Bed    Modified Rankin (Stroke Patients Only)       Balance Overall balance assessment: Needs assistance   Sitting balance-Leahy Scale: Normal  Standing balance support: Bilateral upper extremity supported, During functional activity, Reliant on assistive device for balance Standing balance-Leahy Scale: Good                                Pertinent Vitals/Pain Pain Assessment Pain Assessment: No/denies pain    Home Living Family/patient expects to be discharged to:: Private residence Living Arrangements: Spouse/significant other Available Help at Discharge: Family;Available 24 hours/day Type of Home: House Home Access: Stairs to enter Entrance Stairs-Rails: Left Entrance Stairs-Number of Steps: 3   Home Layout: One level Home Equipment: Cane - single Acupuncturist (4 wheels)      Prior Function Prior Level of Function : Independent/Modified Independent             Mobility Comments: Mod ind amb with a rollator household distances only, no fall history ADLs Comments: Ind with ADLs with pt reporting mostly sponge bathing recently     Extremity/Trunk Assessment   Upper Extremity Assessment Upper Extremity Assessment: Generalized weakness    Lower Extremity Assessment Lower Extremity Assessment: Generalized weakness       Communication   Communication Communication: No apparent difficulties    Cognition Arousal: Alert Behavior During Therapy: WFL for tasks assessed/performed   PT - Cognitive impairments: No apparent impairments                         Following commands: Intact       Cueing Cueing Techniques: Verbal cues     General Comments      Exercises Other Exercises Other Exercises: Log roll training   Assessment/Plan    PT Assessment Patient needs continued PT services  PT Problem List Decreased strength;Decreased activity tolerance;Decreased balance;Decreased mobility;Decreased knowledge of use of DME       PT Treatment Interventions DME instruction;Gait training;Stair training;Functional mobility training;Therapeutic activities;Therapeutic exercise;Balance training;Patient/family education    PT Goals (Current goals can be found in the Care Plan section)  Acute Rehab PT Goals Patient Stated Goal: To get stronger and do more without  feeling tired PT Goal Formulation: With patient Time For Goal Achievement: 12/21/24 Potential to Achieve Goals: Good    Frequency Min 2X/week     Co-evaluation PT/OT/SLP Co-Evaluation/Treatment: Yes Reason for Co-Treatment: Complexity of the patient's impairments (multi-system involvement);For patient/therapist safety PT goals addressed during session: Mobility/safety with mobility;Strengthening/ROM;Balance         AM-PAC PT 6 Clicks Mobility  Outcome Measure Help needed turning from your back to your side while in a flat bed without using bedrails?: A Little Help needed moving from lying on your back to sitting on the side of a flat bed without using bedrails?: A Little Help needed moving to and from a bed to a chair (including a wheelchair)?: A Little Help needed standing up from a chair using your arms (e.g., wheelchair or bedside chair)?: A Little Help needed to walk in hospital room?: A Little Help needed climbing 3-5 steps with a railing? : A Lot 6 Click Score: 17    End of Session Equipment Utilized During Treatment: Gait belt;Oxygen  Activity Tolerance: Patient tolerated treatment well Patient left: in bed;with call bell/phone within reach;with bed alarm set;with nursing/sitter in room Nurse Communication: Mobility status;Other (comment) (SpO2 with exertion per above) PT Visit Diagnosis: Difficulty in walking, not elsewhere classified (R26.2);Muscle weakness (generalized) (M62.81)    Time: 9045-8973 PT Time Calculation (min) (ACUTE ONLY): 32 min  Charges:   PT Evaluation $PT Eval Moderate Complexity: 1 Mod   PT General Charges $$ ACUTE PT VISIT: 1 Visit    D. Scott Felicite Zeimet PT, DPT 12/08/2024, 11:30 AM

## 2024-12-08 NOTE — Plan of Care (Signed)
" °  Problem: Education: Goal: Ability to describe self-care measures that may prevent or decrease complications (Diabetes Survival Skills Education) will improve Outcome: Progressing   Problem: Coping: Goal: Ability to adjust to condition or change in health will improve Outcome: Progressing   Problem: Fluid Volume: Goal: Ability to maintain a balanced intake and output will improve Outcome: Progressing   Problem: Health Behavior/Discharge Planning: Goal: Ability to identify and utilize available resources and services will improve Outcome: Progressing Goal: Ability to manage health-related needs will improve Outcome: Progressing   Problem: Metabolic: Goal: Ability to maintain appropriate glucose levels will improve Outcome: Progressing   Problem: Nutritional: Goal: Maintenance of adequate nutrition will improve Outcome: Progressing Goal: Progress toward achieving an optimal weight will improve Outcome: Progressing   Problem: Skin Integrity: Goal: Risk for impaired skin integrity will decrease Outcome: Progressing   Problem: Tissue Perfusion: Goal: Adequacy of tissue perfusion will improve Outcome: Progressing   Problem: Education: Goal: Knowledge of General Education information will improve Description: Including pain rating scale, medication(s)/side effects and non-pharmacologic comfort measures Outcome: Progressing   Problem: Health Behavior/Discharge Planning: Goal: Ability to manage health-related needs will improve Outcome: Progressing   Problem: Clinical Measurements: Goal: Ability to maintain clinical measurements within normal limits will improve Outcome: Progressing Goal: Will remain free from infection Outcome: Progressing Goal: Diagnostic test results will improve Outcome: Progressing Goal: Respiratory complications will improve Outcome: Progressing Goal: Cardiovascular complication will be avoided Outcome: Progressing   Problem: Activity: Goal:  Risk for activity intolerance will decrease Outcome: Progressing   Problem: Nutrition: Goal: Adequate nutrition will be maintained Outcome: Progressing   Problem: Coping: Goal: Level of anxiety will decrease Outcome: Progressing   Problem: Elimination: Goal: Will not experience complications related to bowel motility Outcome: Progressing Goal: Will not experience complications related to urinary retention Outcome: Progressing   Problem: Pain Managment: Goal: General experience of comfort will improve and/or be controlled Outcome: Progressing   Problem: Safety: Goal: Ability to remain free from injury will improve Outcome: Progressing   Problem: Skin Integrity: Goal: Risk for impaired skin integrity will decrease Outcome: Progressing   Problem: Education: Goal: Ability to demonstrate management of disease process will improve Outcome: Progressing Goal: Ability to verbalize understanding of medication therapies will improve Outcome: Progressing Goal: Individualized Educational Video(s) Outcome: Progressing   Problem: Activity: Goal: Capacity to carry out activities will improve Outcome: Progressing   Problem: Cardiac: Goal: Ability to achieve and maintain adequate cardiopulmonary perfusion will improve Outcome: Progressing   Problem: Education: Goal: Knowledge of disease or condition will improve Outcome: Progressing Goal: Knowledge of the prescribed therapeutic regimen will improve Outcome: Progressing Goal: Individualized Educational Video(s) Outcome: Progressing   Problem: Activity: Goal: Ability to tolerate increased activity will improve Outcome: Progressing Goal: Will verbalize the importance of balancing activity with adequate rest periods Outcome: Progressing   Problem: Respiratory: Goal: Ability to maintain a clear airway will improve Outcome: Progressing Goal: Levels of oxygenation will improve Outcome: Progressing Goal: Ability to maintain  adequate ventilation will improve Outcome: Progressing   "

## 2024-12-09 DIAGNOSIS — J9621 Acute and chronic respiratory failure with hypoxia: Secondary | ICD-10-CM | POA: Diagnosis not present

## 2024-12-09 LAB — BASIC METABOLIC PANEL WITH GFR
Anion gap: 10 (ref 5–15)
BUN: 35 mg/dL — ABNORMAL HIGH (ref 8–23)
CO2: 28 mmol/L (ref 22–32)
Calcium: 9.3 mg/dL (ref 8.9–10.3)
Chloride: 104 mmol/L (ref 98–111)
Creatinine, Ser: 1 mg/dL (ref 0.61–1.24)
GFR, Estimated: >60 mL/min
Glucose, Bld: 135 mg/dL — ABNORMAL HIGH (ref 70–99)
Potassium: 3.8 mmol/L (ref 3.5–5.1)
Sodium: 141 mmol/L (ref 135–145)

## 2024-12-09 LAB — GLUCOSE, CAPILLARY
Glucose-Capillary: 124 mg/dL — ABNORMAL HIGH (ref 70–99)
Glucose-Capillary: 122 mg/dL — ABNORMAL HIGH (ref 70–99)
Glucose-Capillary: 192 mg/dL — ABNORMAL HIGH (ref 70–99)
Glucose-Capillary: 189 mg/dL — ABNORMAL HIGH (ref 70–99)

## 2024-12-09 LAB — MAGNESIUM: Magnesium: 2.2 mg/dL (ref 1.7–2.4)

## 2024-12-09 LAB — PHOSPHORUS: Phosphorus: 2.6 mg/dL (ref 2.5–4.6)

## 2024-12-09 MED ORDER — IPRATROPIUM-ALBUTEROL 0.5-2.5 (3) MG/3ML IN SOLN
3.0000 mL | Freq: Three times a day (TID) | RESPIRATORY_TRACT | Status: DC
  Filled 2024-12-09: qty 3

## 2024-12-09 MED ORDER — FOLIC ACID 1 MG PO TABS
1.0000 mg | ORAL_TABLET | Freq: Every day | ORAL | Status: DC
  Administered 2024-12-09 – 2024-12-21 (×13): 1 mg via ORAL
  Filled 2024-12-09 (×13): qty 1
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
## 2024-12-09 NOTE — Plan of Care (Signed)
" °  Problem: Education: Goal: Ability to describe self-care measures that may prevent or decrease complications (Diabetes Survival Skills Education) will improve Outcome: Progressing   Problem: Coping: Goal: Ability to adjust to condition or change in health will improve Outcome: Progressing   Problem: Fluid Volume: Goal: Ability to maintain a balanced intake and output will improve Outcome: Progressing   Problem: Health Behavior/Discharge Planning: Goal: Ability to identify and utilize available resources and services will improve Outcome: Progressing Goal: Ability to manage health-related needs will improve Outcome: Progressing   Problem: Metabolic: Goal: Ability to maintain appropriate glucose levels will improve Outcome: Progressing   Problem: Nutritional: Goal: Maintenance of adequate nutrition will improve Outcome: Progressing Goal: Progress toward achieving an optimal weight will improve Outcome: Progressing   Problem: Skin Integrity: Goal: Risk for impaired skin integrity will decrease Outcome: Progressing   Problem: Tissue Perfusion: Goal: Adequacy of tissue perfusion will improve Outcome: Progressing   Problem: Education: Goal: Knowledge of General Education information will improve Description: Including pain rating scale, medication(s)/side effects and non-pharmacologic comfort measures Outcome: Progressing   Problem: Health Behavior/Discharge Planning: Goal: Ability to manage health-related needs will improve Outcome: Progressing   Problem: Clinical Measurements: Goal: Ability to maintain clinical measurements within normal limits will improve Outcome: Progressing Goal: Will remain free from infection Outcome: Progressing Goal: Diagnostic test results will improve Outcome: Progressing Goal: Respiratory complications will improve Outcome: Progressing Goal: Cardiovascular complication will be avoided Outcome: Progressing   Problem: Activity: Goal:  Risk for activity intolerance will decrease Outcome: Progressing   Problem: Nutrition: Goal: Adequate nutrition will be maintained Outcome: Progressing   Problem: Coping: Goal: Level of anxiety will decrease Outcome: Progressing   Problem: Elimination: Goal: Will not experience complications related to bowel motility Outcome: Progressing Goal: Will not experience complications related to urinary retention Outcome: Progressing   Problem: Pain Managment: Goal: General experience of comfort will improve and/or be controlled Outcome: Progressing   Problem: Safety: Goal: Ability to remain free from injury will improve Outcome: Progressing   Problem: Skin Integrity: Goal: Risk for impaired skin integrity will decrease Outcome: Progressing   Problem: Education: Goal: Ability to demonstrate management of disease process will improve Outcome: Progressing Goal: Ability to verbalize understanding of medication therapies will improve Outcome: Progressing Goal: Individualized Educational Video(s) Outcome: Progressing   Problem: Activity: Goal: Capacity to carry out activities will improve Outcome: Progressing   Problem: Cardiac: Goal: Ability to achieve and maintain adequate cardiopulmonary perfusion will improve Outcome: Progressing   Problem: Education: Goal: Knowledge of disease or condition will improve Outcome: Progressing Goal: Knowledge of the prescribed therapeutic regimen will improve Outcome: Progressing Goal: Individualized Educational Video(s) Outcome: Progressing   Problem: Activity: Goal: Ability to tolerate increased activity will improve Outcome: Progressing Goal: Will verbalize the importance of balancing activity with adequate rest periods Outcome: Progressing   Problem: Respiratory: Goal: Ability to maintain a clear airway will improve Outcome: Progressing Goal: Levels of oxygenation will improve Outcome: Progressing Goal: Ability to maintain  adequate ventilation will improve Outcome: Progressing   "

## 2024-12-10 DIAGNOSIS — J9621 Acute and chronic respiratory failure with hypoxia: Secondary | ICD-10-CM | POA: Diagnosis not present

## 2024-12-10 LAB — BASIC METABOLIC PANEL WITH GFR
Anion gap: 10 (ref 5–15)
BUN: 42 mg/dL — ABNORMAL HIGH (ref 8–23)
CO2: 29 mmol/L (ref 22–32)
Calcium: 8.9 mg/dL (ref 8.9–10.3)
Chloride: 102 mmol/L (ref 98–111)
Creatinine, Ser: 0.92 mg/dL (ref 0.61–1.24)
GFR, Estimated: >60 mL/min
Glucose, Bld: 119 mg/dL — ABNORMAL HIGH (ref 70–99)
Potassium: 4.6 mmol/L (ref 3.5–5.1)
Sodium: 141 mmol/L (ref 135–145)

## 2024-12-10 LAB — GLUCOSE, CAPILLARY
Glucose-Capillary: 120 mg/dL — ABNORMAL HIGH (ref 70–99)
Glucose-Capillary: 133 mg/dL — ABNORMAL HIGH (ref 70–99)
Glucose-Capillary: 192 mg/dL — ABNORMAL HIGH (ref 70–99)
Glucose-Capillary: 186 mg/dL — ABNORMAL HIGH (ref 70–99)

## 2024-12-10 NOTE — Progress Notes (Signed)
 Physical Therapy Treatment Patient Details Name: [name]. MRN: [id number] DOB: Jun 06, 1941 Today's Date: 12/10/2024   History of Present Illness Pt is an 84 y.o. 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  MD assessment includes: acute on chronic respiratory failure with hypoxia, right seventh rib and T8 fracture on CT 12/05/2024, acute on chronic systolic CHF, and physical deconditioning.    PT Comments  Ready for session.  He is able to get to EOB with bed features.  Steady in sitting.  Stand with good technique  and feels good marching in place.  Sats 97% on 5 lpm at rest.  He is able to walk 60' x 1 and 40' x 1 limited by sats decreasing to 82 and 84% respectively.  He shows good awareness of O2 cording and generally steady gait.  Education provided in regards to energy conservation and pacing at home.  He has a pulse oxymeter at home which he uses to monitor as needed.  Encouraged shorter more frequent activity vs longer walks and tasks.  Voiced understanding. Pt stated he is on 3 lpm at home at baseline.   If plan is discharge home, recommend the following: A little help with walking and/or transfers;A little help with bathing/dressing/bathroom;Assistance with cooking/housework;Help with stairs or ramp for entrance;Assist for transportation   Can travel by private vehicle        Equipment Recommendations  Rolling walker (2 wheels) (pt has rollator at home that he prefers.  may refuse RW)    Recommendations for Other Services       Precautions / Restrictions Precautions Precautions:  Fall Recall of Precautions/Restrictions: Intact Restrictions Weight Bearing Restrictions Per Provider Order: No     Mobility  Bed Mobility Overal bed mobility: Needs Assistance Bed Mobility: Supine to Sit     Supine to sit: Contact guard, Used rails, HOB elevated       Patient Response: Cooperative  Transfers Overall transfer level: Needs assistance Equipment used: Rolling walker (2 wheels) Transfers: Sit to/from Stand Sit to Stand: Contact guard assist           General transfer comment: Good eccentric and concentric control and stability    Ambulation/Gait Ambulation/Gait assistance: Supervision Gait Distance (Feet): 60 Feet Assistive device: Rolling walker (2 wheels) Gait Pattern/deviations: Step-through pattern, Decreased step length - right, Decreased step length - left, Trunk flexed Gait velocity: decreased     General Gait Details: 35'  40'  limited by dec O2 sats more than fatigue   Stairs             Wheelchair Mobility     Tilt Bed Tilt Bed Patient Response: Cooperative  Modified Rankin (Stroke Patients Only)       Balance Overall balance assessment: Needs assistance Sitting-balance support: Feet supported Sitting balance-Leahy Scale: Normal     Standing balance support: Bilateral upper extremity supported Standing balance-Leahy Scale: Good                              Communication Communication Communication: No apparent difficulties  Cognition Arousal: Alert Behavior During Therapy: WFL for tasks assessed/performed   PT - Cognitive  impairments: No apparent impairments                         Following commands: Intact      Cueing Cueing Techniques: Verbal cues  Exercises      General Comments        Pertinent Vitals/Pain Pain Assessment Pain Assessment: No/denies pain    Home Living                          Prior Function            PT Goals (current goals can now be  found in the care plan section) Progress towards PT goals: Progressing toward goals    Frequency    Min 2X/week      PT Plan      Co-evaluation              AM-PAC PT 6 Clicks Mobility   Outcome Measure  Help needed turning from your back to your side while in a flat bed without using bedrails?: None Help needed moving from lying on your back to sitting on the side of a flat bed without using bedrails?: A Little Help needed moving to and from a bed to a chair (including a wheelchair)?: A Little Help needed standing up from a chair using your arms (e.g., wheelchair or bedside chair)?: A Little Help needed to walk in hospital room?: A Little Help needed climbing 3-5 steps with a railing? : A Lot 6 Click Score: 18    End of Session Equipment Utilized During Treatment: Gait belt;Oxygen  Activity Tolerance: Patient tolerated treatment well Patient left: in chair;with call bell/phone within reach;with chair alarm set;with nursing/sitter in room Nurse Communication: Mobility status;Other (comment) PT Visit Diagnosis: Difficulty in walking, not elsewhere classified (R26.2);Muscle weakness (generalized) (M62.81)     Time: 9184-9164 PT Time Calculation (min) (ACUTE ONLY): 20 min  Charges:    $Gait Training: 8-22 mins PT General Charges $$ ACUTE PT VISIT: 1 Visit                   Lauraine Gills, PTA 12/10/2024, 9:18 AM
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
## 2024-12-10 NOTE — Plan of Care (Signed)
" °  Problem: Education: Goal: Ability to describe self-care measures that may prevent or decrease complications (Diabetes Survival Skills Education) will improve Outcome: Progressing   Problem: Coping: Goal: Ability to adjust to condition or change in health will improve Outcome: Progressing   Problem: Fluid Volume: Goal: Ability to maintain a balanced intake and output will improve Outcome: Progressing   Problem: Health Behavior/Discharge Planning: Goal: Ability to identify and utilize available resources and services will improve Outcome: Progressing Goal: Ability to manage health-related needs will improve Outcome: Progressing   Problem: Metabolic: Goal: Ability to maintain appropriate glucose levels will improve Outcome: Progressing   Problem: Nutritional: Goal: Maintenance of adequate nutrition will improve Outcome: Progressing Goal: Progress toward achieving an optimal weight will improve Outcome: Progressing   Problem: Skin Integrity: Goal: Risk for impaired skin integrity will decrease Outcome: Progressing   Problem: Tissue Perfusion: Goal: Adequacy of tissue perfusion will improve Outcome: Progressing   Problem: Education: Goal: Knowledge of General Education information will improve Description: Including pain rating scale, medication(s)/side effects and non-pharmacologic comfort measures Outcome: Progressing   Problem: Health Behavior/Discharge Planning: Goal: Ability to manage health-related needs will improve Outcome: Progressing   Problem: Clinical Measurements: Goal: Ability to maintain clinical measurements within normal limits will improve Outcome: Progressing Goal: Will remain free from infection Outcome: Progressing Goal: Diagnostic test results will improve Outcome: Progressing Goal: Respiratory complications will improve Outcome: Progressing Goal: Cardiovascular complication will be avoided Outcome: Progressing   Problem: Activity: Goal:  Risk for activity intolerance will decrease Outcome: Progressing   Problem: Nutrition: Goal: Adequate nutrition will be maintained Outcome: Progressing   Problem: Coping: Goal: Level of anxiety will decrease Outcome: Progressing   Problem: Elimination: Goal: Will not experience complications related to bowel motility Outcome: Progressing Goal: Will not experience complications related to urinary retention Outcome: Progressing   Problem: Pain Managment: Goal: General experience of comfort will improve and/or be controlled Outcome: Progressing   Problem: Safety: Goal: Ability to remain free from injury will improve Outcome: Progressing   Problem: Skin Integrity: Goal: Risk for impaired skin integrity will decrease Outcome: Progressing   Problem: Education: Goal: Ability to demonstrate management of disease process will improve Outcome: Progressing Goal: Ability to verbalize understanding of medication therapies will improve Outcome: Progressing Goal: Individualized Educational Video(s) Outcome: Progressing   Problem: Activity: Goal: Capacity to carry out activities will improve Outcome: Progressing   Problem: Cardiac: Goal: Ability to achieve and maintain adequate cardiopulmonary perfusion will improve Outcome: Progressing   Problem: Education: Goal: Knowledge of disease or condition will improve Outcome: Progressing Goal: Knowledge of the prescribed therapeutic regimen will improve Outcome: Progressing Goal: Individualized Educational Video(s) Outcome: Progressing   Problem: Activity: Goal: Ability to tolerate increased activity will improve Outcome: Progressing Goal: Will verbalize the importance of balancing activity with adequate rest periods Outcome: Progressing   Problem: Respiratory: Goal: Ability to maintain a clear airway will improve Outcome: Progressing Goal: Levels of oxygenation will improve Outcome: Progressing Goal: Ability to maintain  adequate ventilation will improve Outcome: Progressing   "

## 2024-12-11 DIAGNOSIS — J9621 Acute and chronic respiratory failure with hypoxia: Secondary | ICD-10-CM | POA: Diagnosis not present

## 2024-12-11 LAB — BASIC METABOLIC PANEL WITH GFR
Anion gap: 8 (ref 5–15)
BUN: 39 mg/dL — ABNORMAL HIGH (ref 8–23)
CO2: 31 mmol/L (ref 22–32)
Calcium: 9.4 mg/dL (ref 8.9–10.3)
Chloride: 104 mmol/L (ref 98–111)
Creatinine, Ser: 0.86 mg/dL (ref 0.61–1.24)
GFR, Estimated: >60 mL/min
Glucose, Bld: 105 mg/dL — ABNORMAL HIGH (ref 70–99)
Potassium: 4.1 mmol/L (ref 3.5–5.1)
Sodium: 142 mmol/L (ref 135–145)

## 2024-12-11 LAB — GLUCOSE, CAPILLARY
Glucose-Capillary: 101 mg/dL — ABNORMAL HIGH (ref 70–99)
Glucose-Capillary: 153 mg/dL — ABNORMAL HIGH (ref 70–99)
Glucose-Capillary: 160 mg/dL — ABNORMAL HIGH (ref 70–99)
Glucose-Capillary: 141 mg/dL — ABNORMAL HIGH (ref 70–99)
Glucose-Capillary: 171 mg/dL — ABNORMAL HIGH (ref 70–99)
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
## 2024-12-11 NOTE — Progress Notes (Signed)
 Occupational Therapy Treatment Patient Details Name: [name]. MRN: [id number] DOB: Dec 28, 1940 Today's Date: 12/11/2024   History of present illness Pt is an 84 y.o. 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  MD assessment includes: acute on chronic respiratory failure with hypoxia, right seventh rib and T8 fracture on CT 12/05/2024, acute on chronic systolic CHF, and physical deconditioning.   OT comments  Pt seen for OT tx. Pt received in recliner, 5L O2, SpO2 >97%. Decreased to 4L and maintained >94%. Pt edu in ECS including PLB, activity pacing, work simplification, home/routines modifications, AE/DME, and IS and flutter valve use with handout provided. Pt verbalized understanding. Able to verbalize strategies he has previously implemented and verbalized interest in implementing learned strategies to optimize safety/indep with ADL and IADL. Progressing well.       If plan is discharge home, recommend the following:  Help with stairs or ramp for entrance;Assistance with cooking/housework   Equipment Recommendations  BSC/3in1    Recommendations for Other Services      Precautions / Restrictions Precautions Precautions: Fall Recall of Precautions/Restrictions: Intact Restrictions Weight Bearing Restrictions Per Provider Order: No Other Position/Activity Restrictions: Watch SpO2 and RR       Mobility Bed Mobility                    Transfers                         Balance                                           ADL either performed or  assessed with clinical judgement   ADL                                              Extremity/Trunk Assessment              Vision       Perception     Praxis     Communication Communication Communication: No apparent difficulties   Cognition Arousal: Alert Behavior During Therapy: WFL for tasks assessed/performed Cognition: No apparent impairments                               Following commands: Intact        Cueing   Cueing Techniques: Verbal cues  Exercises Other Exercises Other Exercises: Pt edu in ECS including PLB, activity pacing, work simplification, home/routines modifications, AE/DME, and IS and flutter valve use with handout provided.    Shoulder Instructions       General Comments      Pertinent Vitals/ Pain       Pain Assessment Pain Assessment: No/denies pain  Home Living  Prior Functioning/Environment              Frequency  Min 2X/week        Progress Toward Goals  OT Goals(current goals can now be found in the care plan section)  Progress towards OT goals: Progressing toward goals  Acute Rehab OT Goals Patient Stated Goal: go home OT Goal Formulation: With patient Time For Goal Achievement: 12/22/24 Potential to Achieve Goals: Good  Plan      Co-evaluation                 AM-PAC OT 6 Clicks Daily Activity     Outcome Measure   Help from another person eating meals?: None Help from another person taking care of personal grooming?: A Little Help from another person toileting, which includes using toliet, bedpan, or urinal?: A Little Help from another person bathing (including washing, rinsing, drying)?: A Little Help from another person to put on and taking off regular upper body clothing?: None Help from another person to put on and taking off regular lower body clothing?: A Little 6 Click Score: 20    End of  Session Equipment Utilized During Treatment: Oxygen   OT Visit Diagnosis: Other abnormalities of gait and mobility (R26.89);Muscle weakness (generalized) (M62.81)   Activity Tolerance Patient tolerated treatment well   Patient Left in chair;with call bell/phone within reach;with chair alarm set   Nurse Communication          Time: 8873-8845 OT Time Calculation (min): 28 min  Charges: OT General Charges $OT Visit: 1 Visit OT Treatments $Self Care/Home Management : 23-37 mins  Warren SAUNDERS., MPH, MS, OTR/L ascom [phone] 12/11/2024, 1:33 PM

## 2024-12-11 NOTE — Progress Notes (Signed)
 Heart Failure Navigator Progress Note  Assessed for Heart & Vascular TOC clinic readiness.  Patient does not meet criteria due to current [facility] Cardiology patient.   Navigator will sign off at this time.  Roxy Horseman, RN, BSN [facility] Heart Failure Navigator Secure Chat Only

## 2024-12-11 NOTE — Care Management Important Message (Signed)
 Important Message  Patient Details  Name: [name]. MRN: [id number] Date of Birth: [birth year]   Important Message Given:  Yes - Medicare IM     [name], CMA 12/11/2024, 12:43 PM

## 2024-12-11 NOTE — Progress Notes (Signed)
 Physical Therapy Treatment Patient Details Name: [name]. MRN: [id number] DOB: [birth year] Today's Date: 12/11/2024   History of Present Illness Pt is an 84 y.o. 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  MD assessment includes: acute on chronic respiratory failure with hypoxia, right seventh rib and T8 fracture on CT 12/05/2024, acute on chronic systolic CHF, and physical deconditioning.    PT Comments  Pt found sitting EOB struggling to put O2 back on.  Stated he removed O2 to walk to bathroom and light ADL's.  Stated cording would not reach so he opted to go without.  Extended placed and education to call for assist as sats 78% on room air.  While recovering he stated he is a bit dizzy this am.  BP 132/85 P 96 sitting EOB, 120/77 P 96 standing, 135/80 P 95 3 minutes.  He also stated he is being tested for Myasthenia Gravis as OP and should have results this week which he feels might explain his dizziness/fuzzy/visual deficits.  Education for safety.  He recovers to 94% and is able to walk 40' x 2 in room with O2 at 5 lpm with good steady gait with RW.  No LOB noted.  Gait continues to be limited by decreased O2 sats.  He remains motivated to return home and increase strength.     If plan is discharge home, recommend the following: Assistance with cooking/housework;Help with stairs or ramp for entrance;Assist for transportation   Can travel by private vehicle        Equipment Recommendations  Rolling walker (2 wheels)    Recommendations for Other Services       Precautions / Restrictions  Precautions Precautions: Fall Recall of Precautions/Restrictions: Intact Restrictions Weight Bearing Restrictions Per Provider Order: No     Mobility  Bed Mobility               General bed mobility comments: sitting EOB upon arrival Patient Response: Cooperative  Transfers Overall transfer level: Needs assistance Equipment used: Rolling walker (2 wheels) Transfers: Sit to/from Stand Sit to Stand: Supervision                Ambulation/Gait Ambulation/Gait assistance: Supervision Gait Distance (Feet): 40 Feet Assistive device: Rolling walker (2 wheels) Gait Pattern/deviations: Step-through pattern, Decreased step length - right, Decreased step length - left, Trunk flexed Gait velocity: decreased     General Gait Details: 40' x 2 remains limited by decreased O2 sats with gait despite 5 lpm   Stairs             Wheelchair Mobility     Tilt Bed Tilt Bed Patient Response: Cooperative  Modified Rankin (Stroke Patients Only)       Balance Overall balance assessment: Modified Independent, Mild deficits observed, not formally tested   Sitting balance-Leahy Scale: Normal     Standing balance support: Bilateral upper extremity supported Standing balance-Leahy Scale: Good Standing balance comment: no LOB noted with session and generally steady with RW                            Communication Communication Communication: No apparent difficulties  Cognition  Arousal: Alert Behavior During Therapy: WFL for tasks assessed/performed   PT - Cognitive impairments: No apparent impairments                         Following commands: Intact      Cueing Cueing Techniques: Verbal cues  Exercises      General Comments        Pertinent Vitals/Pain Pain Assessment Pain Assessment: No/denies pain    Home Living                          Prior Function            PT Goals (current goals can now be found in the  care plan section) Progress towards PT goals: Progressing toward goals    Frequency    Min 2X/week      PT Plan      Co-evaluation              AM-PAC PT 6 Clicks Mobility   Outcome Measure  Help needed turning from your back to your side while in a flat bed without using bedrails?: None Help needed moving from lying on your back to sitting on the side of a flat bed without using bedrails?: None Help needed moving to and from a bed to a chair (including a wheelchair)?: None Help needed standing up from a chair using your arms (e.g., wheelchair or bedside chair)?: None Help needed to walk in hospital room?: A Little Help needed climbing 3-5 steps with a railing? : A Little 6 Click Score: 22    End of Session Equipment Utilized During Treatment: Oxygen  Activity Tolerance: Patient tolerated treatment well Patient left: in chair;with call bell/phone within reach;with chair alarm set Nurse Communication: Mobility status;Other (comment) PT Visit Diagnosis: Difficulty in walking, not elsewhere classified (R26.2);Muscle weakness (generalized) (M62.81)     Time: 0940-1006 PT Time Calculation (min) (ACUTE ONLY): 26 min  Charges:    $Gait Training: 23-37 mins PT General Charges $$ ACUTE PT VISIT: 1 Visit                   Lauraine Gills, PTA 12/11/2024, 11:03 AM

## 2024-12-11 NOTE — Plan of Care (Signed)

## 2024-12-12 DIAGNOSIS — J9621 Acute and chronic respiratory failure with hypoxia: Secondary | ICD-10-CM | POA: Diagnosis not present

## 2024-12-12 LAB — CBC
HCT: 40.5 % (ref 39.0–52.0)
Hemoglobin: 13.1 g/dL (ref 13.0–17.0)
MCH: 33.9 pg (ref 26.0–34.0)
MCHC: 32.3 g/dL (ref 30.0–36.0)
MCV: 104.9 fL — ABNORMAL HIGH (ref 80.0–100.0)
Platelets: 136 K/uL — ABNORMAL LOW (ref 150–400)
RBC: 3.86 MIL/uL — ABNORMAL LOW (ref 4.22–5.81)
RDW: 15.8 % — ABNORMAL HIGH (ref 11.5–15.5)
WBC: 12.3 K/uL — ABNORMAL HIGH (ref 4.0–10.5)
nRBC: 0 % (ref 0.0–0.2)

## 2024-12-12 LAB — GLUCOSE, CAPILLARY
Glucose-Capillary: 88 mg/dL (ref 70–99)
Glucose-Capillary: 124 mg/dL — ABNORMAL HIGH (ref 70–99)
Glucose-Capillary: 260 mg/dL — ABNORMAL HIGH (ref 70–99)
Glucose-Capillary: 111 mg/dL — ABNORMAL HIGH (ref 70–99)

## 2024-12-12 MED ORDER — SENNOSIDES-DOCUSATE SODIUM 8.6-50 MG PO TABS
1.0000 | ORAL_TABLET | Freq: Two times a day (BID) | ORAL | Status: AC
  Administered 2024-12-12 – 2024-12-14 (×6): 1 via ORAL
  Filled 2024-12-12 (×6): qty 1

## 2024-12-12 NOTE — Plan of Care (Signed)
  Problem: Coping: Goal: Ability to adjust to condition or change in health will improve Outcome: Progressing   Problem: Fluid Volume: Goal: Ability to maintain a balanced intake and output will improve Outcome: Progressing   Problem: Health Behavior/Discharge Planning: Goal: Ability to manage health-related needs will improve Outcome: Progressing   Problem: Tissue Perfusion: Goal: Adequacy of tissue perfusion will improve Outcome: Progressing

## 2024-12-12 NOTE — Plan of Care (Signed)
" °  Problem: Education: Goal: Ability to describe self-care measures that may prevent or decrease complications (Diabetes Survival Skills Education) will improve Outcome: Progressing   Problem: Fluid Volume: Goal: Ability to maintain a balanced intake and output will improve Outcome: Progressing   Problem: Coping: Goal: Ability to adjust to condition or change in health will improve Outcome: Progressing   Problem: Health Behavior/Discharge Planning: Goal: Ability to identify and utilize available resources and services will improve Outcome: Progressing Goal: Ability to manage health-related needs will improve Outcome: Progressing   "

## 2024-12-12 NOTE — Progress Notes (Signed)
 Occupational Therapy Treatment Patient Details Name: [name]. MRN: [id number] DOB: 1941/08/23 Today's Date: 12/12/2024   History of present illness Pt is an 84 y.o. 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  MD assessment includes: acute on chronic respiratory failure with hypoxia, right seventh rib and T8 fracture on CT 12/05/2024, acute on chronic systolic CHF, and physical deconditioning.   OT comments  Patient seen for OT treatment on this date. Upon arrival to room patient resting in bed, agreeable to treatment. patient on 5L at start of tx, sat 94-95%; cued patient on PLB and how to use incentive spirometer with good return demo. OT decreased O2 to 3L for ambulation in room, dropped to 84% but returned to 90% in <2 minutes while seated. OT increased O2 to 4L with O2 92-93%.  Patient ended treatment in recliner, refused to have chair alarm set and all needs within reach. OT reviewed ECT with patient stating understanding. Patient making good progress toward goals, will continue to follow POC. Discharge recommendation remains appropriate.        If plan is discharge home, recommend the following:  Help with stairs or ramp for entrance;Assistance with cooking/housework   Equipment Recommendations  BSC/3in1    Recommendations for Other Services      Precautions / Restrictions Precautions Precautions: Fall Recall of Precautions/Restrictions: Intact Restrictions Weight Bearing Restrictions Per Provider Order: No Other Position/Activity Restrictions: Watch SpO2 and RR       Mobility Bed  Mobility Overal bed mobility: Needs Assistance Bed Mobility: Supine to Sit     Supine to sit: Contact guard, Used rails, HOB elevated          Transfers Overall transfer level: Needs assistance Equipment used: Rolling walker (2 wheels) Transfers: Sit to/from Stand Sit to Stand: Supervision           General transfer comment: Good eccentric and concentric control and stability     Balance Overall balance assessment: Modified Independent, Mild deficits observed, not formally tested   Sitting balance-Leahy Scale: Normal                                     ADL either performed or assessed with clinical judgement   ADL Overall ADL's : Needs assistance/impaired                         Toilet Transfer: Contact guard assist;Regular Toilet;Rolling walker (2 wheels)   Toileting- Clothing Manipulation and Hygiene: Contact guard assist;Sit to/from stand         General ADL Comments: able to ambulate ~30 feet with r/w in room with CGA    Extremity/Trunk Assessment Upper Extremity Assessment Upper Extremity Assessment: Generalized weakness   Lower Extremity Assessment Lower Extremity Assessment: Defer to PT evaluation        Vision       Perception     Praxis     Communication Communication Communication: No apparent difficulties   Cognition Arousal: Alert Behavior During Therapy: WFL for tasks assessed/performed Cognition: No apparent impairments  Following commands: Intact        Cueing   Cueing Techniques: Verbal cues  Exercises      Shoulder Instructions       General Comments patient on 5L at start of tx, sat 94-95%; cued patient on PLB and how to use incentive spirometer with good return demo. OT decreased O2 to 3L for ambulation in room, dropped to 84% but returned to 90% in <2 minutes while seated. OT increased O2 to 4L with O2 92-93%.    Pertinent Vitals/ Pain       Pain  Assessment Pain Assessment: No/denies pain  Home Living                                          Prior Functioning/Environment              Frequency  Min 2X/week        Progress Toward Goals  OT Goals(current goals can now be found in the care plan section)  Progress towards OT goals: Progressing toward goals  Acute Rehab OT Goals Patient Stated Goal: to go home OT Goal Formulation: With patient Time For Goal Achievement: 12/22/24 Potential to Achieve Goals: Good ADL Goals Pt Will Perform Grooming: with modified independence;standing Pt Will Perform Lower Body Dressing: with modified independence;sit to/from stand Pt Will Transfer to Toilet: with modified independence;ambulating;regular height toilet  Plan      Co-evaluation                 AM-PAC OT 6 Clicks Daily Activity     Outcome Measure   Help from another person eating meals?: None Help from another person taking care of personal grooming?: A Little Help from another person toileting, which includes using toliet, bedpan, or urinal?: A Little Help from another person bathing (including washing, rinsing, drying)?: A Little Help from another person to put on and taking off regular upper body clothing?: None Help from another person to put on and taking off regular lower body clothing?: A Little 6 Click Score: 20    End of Session Equipment Utilized During Treatment: Rolling walker (2 wheels);Oxygen   OT Visit Diagnosis: Other abnormalities of gait and mobility (R26.89);Muscle weakness (generalized) (M62.81)   Activity Tolerance Patient tolerated treatment well   Patient Left in chair;with call bell/phone within reach;Other (comment) (refused chair alarm)   Nurse Communication Mobility status;Other (comment) (O2 requirement)        Time: 8445-8385 OT Time Calculation (min): 20 min  Charges: OT General Charges $OT Visit: 1 Visit OT Treatments $Self Care/Home Management  : 8-22 mins  Rogers Clause, OT/L MSOT, 12/12/2024

## 2024-12-12 NOTE — TOC Progression Note (Addendum)
 Transition of Care (TOC) - Progression Note    Patient Details  Name: [name]. MRN: [id number] Date of Birth: 05/02/1941  Transition of Care [facility]) CM/SW Contact  Dalia GORMAN Fuse, RN Phone Number: 12/12/2024, 10:03 AM  Clinical Narrative:     Active with [facility] HH. Plan to dc to home with [facility] PT/OT and DME RW and 3 in 1 BSC. Continues IV lasix  and duonebs.  1421: referral for [facility] PT/OT sent to [facility] in the hub. Referral for DME RW and 3 in 1 [facility] sent to [facility] with [employer]. The equipment will be delivered   [facility] will continue to follow.                     Expected Discharge Plan and Services                                               Social Drivers of Health (SDOH) Interventions SDOH Screenings   Food Insecurity: No Food Insecurity (12/08/2024)  Housing: Low Risk (12/08/2024)  Transportation Needs: No Transportation Needs (12/08/2024)  Utilities: Not At Risk (12/08/2024)  Financial Resource Strain: Low Risk  (09/14/2023)   Received from [facility]  Social Connections: Moderately Integrated (12/08/2024)  Tobacco Use: Medium Risk (12/07/2024)    Readmission Risk Interventions     No data to display

## 2024-12-12 NOTE — Progress Notes (Signed)
 3 in 1 [facility]   The patient requires the use of a 3 in 1 BSC due to significant mobility and safety limitations. The patient is unable to ambulate safely to the bathroom because of advanced age, weakness, and fall risk. Transferring on and off a standard toilet presents a hazard, as the patient demonstrated decreased balance, endurance and strength.
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
## 2024-12-13 DIAGNOSIS — J9621 Acute and chronic respiratory failure with hypoxia: Secondary | ICD-10-CM | POA: Diagnosis not present

## 2024-12-13 LAB — CREATININE, SERUM
Creatinine, Ser: 0.96 mg/dL (ref 0.61–1.24)
GFR, Estimated: >60 mL/min

## 2024-12-13 LAB — BASIC METABOLIC PANEL WITH GFR
Anion gap: 9 (ref 5–15)
BUN: 42 mg/dL — ABNORMAL HIGH (ref 8–23)
CO2: 34 mmol/L — ABNORMAL HIGH (ref 22–32)
Calcium: 9.3 mg/dL (ref 8.9–10.3)
Chloride: 99 mmol/L (ref 98–111)
Creatinine, Ser: 0.92 mg/dL (ref 0.61–1.24)
GFR, Estimated: >60 mL/min
Glucose, Bld: 125 mg/dL — ABNORMAL HIGH (ref 70–99)
Potassium: 3.6 mmol/L (ref 3.5–5.1)
Sodium: 141 mmol/L (ref 135–145)

## 2024-12-13 LAB — MAGNESIUM: Magnesium: 2.2 mg/dL (ref 1.7–2.4)

## 2024-12-13 LAB — GLUCOSE, CAPILLARY
Glucose-Capillary: 84 mg/dL (ref 70–99)
Glucose-Capillary: 132 mg/dL — ABNORMAL HIGH (ref 70–99)
Glucose-Capillary: 129 mg/dL — ABNORMAL HIGH (ref 70–99)
Glucose-Capillary: 133 mg/dL — ABNORMAL HIGH (ref 70–99)

## 2024-12-13 NOTE — Progress Notes (Signed)
 PT Cancellation Note  Patient Details Name: [name]. MRN: [id number] DOB: 30-Jun-1941   Cancelled Treatment:     treatment attempt, upon entering the room, pt was I'ly ambulating back from the BR. He refused PT session at this time but did endorse getting up and ambulating earlier in the day. Acute PT will continue to follow and progress per current POC.    Rankin KATHEE Essex 12/13/2024, 3:01 PM

## 2024-12-13 NOTE — Plan of Care (Signed)

## 2024-12-13 NOTE — Plan of Care (Signed)
" °  Problem: Education: Goal: Ability to describe self-care measures that may prevent or decrease complications (Diabetes Survival Skills Education) will improve Outcome: Progressing   Problem: Coping: Goal: Ability to adjust to condition or change in health will improve Outcome: Progressing   Problem: Fluid Volume: Goal: Ability to maintain a balanced intake and output will improve Outcome: Progressing   Problem: Health Behavior/Discharge Planning: Goal: Ability to identify and utilize available resources and services will improve Outcome: Progressing Goal: Ability to manage health-related needs will improve Outcome: Progressing   Problem: Metabolic: Goal: Ability to maintain appropriate glucose levels will improve Outcome: Progressing   Problem: Nutritional: Goal: Maintenance of adequate nutrition will improve Outcome: Progressing Goal: Progress toward achieving an optimal weight will improve Outcome: Progressing   Problem: Skin Integrity: Goal: Risk for impaired skin integrity will decrease Outcome: Progressing   Problem: Tissue Perfusion: Goal: Adequacy of tissue perfusion will improve Outcome: Progressing   Problem: Education: Goal: Knowledge of General Education information will improve Description: Including pain rating scale, medication(s)/side effects and non-pharmacologic comfort measures Outcome: Progressing   Problem: Health Behavior/Discharge Planning: Goal: Ability to manage health-related needs will improve Outcome: Progressing   Problem: Clinical Measurements: Goal: Ability to maintain clinical measurements within normal limits will improve Outcome: Progressing Goal: Will remain free from infection Outcome: Progressing Goal: Diagnostic test results will improve Outcome: Progressing Goal: Respiratory complications will improve Outcome: Progressing Goal: Cardiovascular complication will be avoided Outcome: Progressing   Problem: Activity: Goal:  Risk for activity intolerance will decrease Outcome: Progressing   Problem: Nutrition: Goal: Adequate nutrition will be maintained Outcome: Progressing   Problem: Coping: Goal: Level of anxiety will decrease Outcome: Progressing   Problem: Elimination: Goal: Will not experience complications related to bowel motility Outcome: Progressing Goal: Will not experience complications related to urinary retention Outcome: Progressing   Problem: Pain Managment: Goal: General experience of comfort will improve and/or be controlled Outcome: Progressing   Problem: Safety: Goal: Ability to remain free from injury will improve Outcome: Progressing   Problem: Skin Integrity: Goal: Risk for impaired skin integrity will decrease Outcome: Progressing   Problem: Education: Goal: Ability to demonstrate management of disease process will improve Outcome: Progressing Goal: Ability to verbalize understanding of medication therapies will improve Outcome: Progressing Goal: Individualized Educational Video(s) Outcome: Progressing   Problem: Activity: Goal: Capacity to carry out activities will improve Outcome: Progressing   Problem: Cardiac: Goal: Ability to achieve and maintain adequate cardiopulmonary perfusion will improve Outcome: Progressing   Problem: Education: Goal: Knowledge of disease or condition will improve Outcome: Progressing Goal: Knowledge of the prescribed therapeutic regimen will improve Outcome: Progressing Goal: Individualized Educational Video(s) Outcome: Progressing   Problem: Activity: Goal: Ability to tolerate increased activity will improve Outcome: Progressing Goal: Will verbalize the importance of balancing activity with adequate rest periods Outcome: Progressing   Problem: Respiratory: Goal: Ability to maintain a clear airway will improve Outcome: Progressing Goal: Levels of oxygenation will improve Outcome: Progressing Goal: Ability to maintain  adequate ventilation will improve Outcome: Progressing   "

## 2024-12-13 NOTE — Progress Notes (Signed)
 Occupational Therapy Treatment Patient Details Name: [name]. MRN: [id number] DOB: 08/05/1941 Today's Date: 12/13/2024   History of present illness Pt is an 84 y.o. 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  MD assessment includes: acute on chronic respiratory failure with hypoxia, right seventh rib and T8 fracture on CT 12/05/2024, acute on chronic systolic CHF, and physical deconditioning.   OT comments  Patient seen for OT treatment on this date. Upon arrival to room patient resting in bed, reports feeling weaker today but, agreeable to treatment. patient on 4L at start of tx, satting 98%; reduced to 3L (baseline), dropped to 85% after ambulating 25 feet but recovered to +90% after 1-2 minutes of rest. left patient on 3L and notified RN.  Patient ended treatment in recliner with bed/chair alarm on and all needs within reach. OT instructed on importance of use of incentive spirometer and OOB activity, patient agreeable. Patient making good progress toward goals, will continue to follow POC. Discharge recommendation remains appropriate.        If plan is discharge home, recommend the following:  Help with stairs or ramp for entrance;Assistance with cooking/housework   Equipment Recommendations  None recommended by OT    Recommendations for Other Services      Precautions / Restrictions Precautions Precautions: Fall Recall of Precautions/Restrictions: Intact Restrictions Weight Bearing Restrictions Per Provider Order: No       Mobility Bed Mobility Overal bed mobility: Needs  Assistance Bed Mobility: Supine to Sit     Supine to sit: Contact guard, Used rails, HOB elevated          Transfers Overall transfer level: Needs assistance Equipment used: Rolling walker (2 wheels) Transfers: Sit to/from Stand Sit to Stand: Supervision, Contact guard assist           General transfer comment: Good eccentric and concentric control and stability     Balance Overall balance assessment: Modified Independent, Mild deficits observed, not formally tested   Sitting balance-Leahy Scale: Normal                                     ADL either performed or assessed with clinical judgement   ADL Overall ADL's : Needs assistance/impaired                         Toilet Transfer: Contact guard assist;Regular Toilet;Rolling walker (2 wheels)   Toileting- Clothing Manipulation and Hygiene: Contact guard assist;Sit to/from stand         General ADL Comments: able to ambulate ~30 feet with r/w in room with CGA    Extremity/Trunk Assessment Upper Extremity Assessment Upper Extremity Assessment: Generalized weakness   Lower Extremity Assessment Lower Extremity Assessment: Generalized weakness        Vision       Perception     Praxis     Communication Communication Communication: No apparent difficulties   Cognition Arousal: Alert Behavior During Therapy: WFL for tasks assessed/performed Cognition: No apparent impairments  Following commands: Intact        Cueing   Cueing Techniques: Verbal cues  Exercises      Shoulder Instructions       General Comments patient on 4L at start of tx, satting 98%; reduced to 3L (baseline), dropped to 85% after ambulating 25 feet but recovered to +90% after 1-2 minutes of rest. left patient on 3L.    Pertinent Vitals/ Pain       Pain Assessment Pain Assessment: No/denies pain  Home Living                                           Prior Functioning/Environment              Frequency  Min 2X/week        Progress Toward Goals  OT Goals(current goals can now be found in the care plan section)  Progress towards OT goals: Progressing toward goals  Acute Rehab OT Goals Patient Stated Goal: to go home OT Goal Formulation: With patient Time For Goal Achievement: 12/22/24 Potential to Achieve Goals: Good ADL Goals Pt Will Perform Grooming: with modified independence;standing Pt Will Perform Lower Body Dressing: with modified independence;sit to/from stand Pt Will Transfer to Toilet: with modified independence;ambulating;regular height toilet  Plan      Co-evaluation                 AM-PAC OT 6 Clicks Daily Activity     Outcome Measure   Help from another person eating meals?: None Help from another person taking care of personal grooming?: A Little Help from another person toileting, which includes using toliet, bedpan, or urinal?: A Little Help from another person bathing (including washing, rinsing, drying)?: A Little Help from another person to put on and taking off regular upper body clothing?: None Help from another person to put on and taking off regular lower body clothing?: A Little 6 Click Score: 20    End of Session Equipment Utilized During Treatment: Rolling walker (2 wheels);Oxygen   OT Visit Diagnosis: Other abnormalities of gait and mobility (R26.89);Muscle weakness (generalized) (M62.81)   Activity Tolerance Patient tolerated treatment well   Patient Left in chair;with call bell/phone within reach;Other (comment)   Nurse Communication Mobility status;Other (comment)        Time: 8558-8493 OT Time Calculation (min): 25 min  Charges: OT General Charges $OT Visit: 1 Visit OT Treatments $Self Care/Home Management : 8-22 mins  Rogers Clause, OT/L MSOT, 12/13/2024
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
## 2024-12-13 NOTE — TOC Progression Note (Signed)
 Transition of Care (TOC) - Progression Note    Patient Details  Name: [name]. MRN: [id number] Date of Birth: 1941/05/26  Transition of [facility]) CM/SW Contact  Dalia GORMAN Fuse, RN Phone Number: 12/13/2024, 9:31 AM  Clinical Narrative:     Continuing prednisone  and duonebs, weaning O2. Plan to dc to home with [facility] PT/OT when medically ready. Patient declined 3 in 1 [facility] and they received a RW last year.  TOC will continue to follow                    Expected Discharge Plan and Services                                               Social Drivers of Health (SDOH) Interventions SDOH Screenings   Food Insecurity: No Food Insecurity (12/08/2024)  Housing: Low Risk (12/08/2024)  Transportation Needs: No Transportation Needs (12/08/2024)  Utilities: Not At Risk (12/08/2024)  Financial Resource Strain: Low Risk  (09/14/2023)   Received from [facility]  Social Connections: Moderately Integrated (12/08/2024)  Tobacco Use: Medium Risk (12/07/2024)    Readmission Risk Interventions     No data to display

## 2024-12-14 DIAGNOSIS — J9621 Acute and chronic respiratory failure with hypoxia: Secondary | ICD-10-CM | POA: Diagnosis not present

## 2024-12-14 LAB — CBC WITH DIFFERENTIAL/PLATELET
Abs Immature Granulocytes: 0.22 K/uL — ABNORMAL HIGH (ref 0.00–0.07)
Basophils Absolute: 0.1 K/uL (ref 0.0–0.1)
Basophils Relative: 0 %
Eosinophils Absolute: 0.4 K/uL (ref 0.0–0.5)
Eosinophils Relative: 4 %
HCT: 42.2 % (ref 39.0–52.0)
Hemoglobin: 13.5 g/dL (ref 13.0–17.0)
Immature Granulocytes: 2 %
Lymphocytes Relative: 12 %
Lymphs Abs: 1.3 K/uL (ref 0.7–4.0)
MCH: 33.4 pg (ref 26.0–34.0)
MCHC: 32 g/dL (ref 30.0–36.0)
MCV: 104.5 fL — ABNORMAL HIGH (ref 80.0–100.0)
Monocytes Absolute: 0.5 K/uL (ref 0.1–1.0)
Monocytes Relative: 4 %
Neutro Abs: 8.9 K/uL — ABNORMAL HIGH (ref 1.7–7.7)
Neutrophils Relative %: 78 %
Platelets: 126 K/uL — ABNORMAL LOW (ref 150–400)
RBC: 4.04 MIL/uL — ABNORMAL LOW (ref 4.22–5.81)
RDW: 15.6 % — ABNORMAL HIGH (ref 11.5–15.5)
WBC: 11.4 K/uL — ABNORMAL HIGH (ref 4.0–10.5)
nRBC: 0.2 % (ref 0.0–0.2)

## 2024-12-14 LAB — GLUCOSE, CAPILLARY
Glucose-Capillary: 109 mg/dL — ABNORMAL HIGH (ref 70–99)
Glucose-Capillary: 175 mg/dL — ABNORMAL HIGH (ref 70–99)
Glucose-Capillary: 131 mg/dL — ABNORMAL HIGH (ref 70–99)
Glucose-Capillary: 104 mg/dL — ABNORMAL HIGH (ref 70–99)

## 2024-12-14 LAB — PRO BRAIN NATRIURETIC PEPTIDE: Pro Brain Natriuretic Peptide: 504 pg/mL — ABNORMAL HIGH

## 2024-12-14 LAB — BASIC METABOLIC PANEL WITH GFR
Anion gap: 7 (ref 5–15)
BUN: 43 mg/dL — ABNORMAL HIGH (ref 8–23)
CO2: 35 mmol/L — ABNORMAL HIGH (ref 22–32)
Calcium: 9.1 mg/dL (ref 8.9–10.3)
Chloride: 102 mmol/L (ref 98–111)
Creatinine, Ser: 0.98 mg/dL (ref 0.61–1.24)
GFR, Estimated: >60 mL/min
Glucose, Bld: 96 mg/dL (ref 70–99)
Potassium: 4.2 mmol/L (ref 3.5–5.1)
Sodium: 144 mmol/L (ref 135–145)

## 2024-12-14 LAB — VITAMIN D 25 HYDROXY (VIT D DEFICIENCY, FRACTURES): Vit D, 25-Hydroxy: 13 ng/mL — ABNORMAL LOW (ref 30–100)

## 2024-12-14 MED ORDER — VITAMIN D (ERGOCALCIFEROL) 1.25 MG (50000 UNIT) PO CAPS
50000.0000 [IU] | ORAL_CAPSULE | ORAL | Status: DC
  Administered 2024-12-14: 50000 [IU] via ORAL
  Filled 2024-12-14 (×2): qty 1

## 2024-12-14 MED ORDER — TORSEMIDE 20 MG PO TABS
20.0000 mg | ORAL_TABLET | Freq: Every day | ORAL | Status: DC
  Administered 2024-12-15 – 2024-12-16 (×2): 20 mg via ORAL
  Filled 2024-12-14 (×2): qty 1

## 2024-12-14 NOTE — Progress Notes (Signed)
 Physical Therapy Treatment Patient Details Name: [name]. MRN: [id number] DOB: [birth year] Today's Date: 12/14/2024   History of Present Illness Pt is an 84 y.o. 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  MD assessment includes: acute on chronic respiratory failure with hypoxia, right seventh rib and T8 fracture on CT 12/05/2024, acute on chronic systolic CHF, and physical deconditioning.    PT Comments  Patient alert, agreeable to PT, up in chair at start and end of session, denied pain. Sit <> stand with RW And supervision, extra time/attempts needed but able to complete without physical assistance. First bout of 30ft with RW and CGA-supervision on 3L. Desaturation noted to 84%. RN present in room and agreeable to 4L for second bout, spO2 >90% throughout including during recovery, and returned to 3L at end of session. The patient would benefit from further skilled PT intervention to continue to progress towards goals.     If plan is discharge home, recommend the following: Assistance with cooking/housework;Help with stairs or ramp for entrance;Assist for transportation   Can travel by private vehicle        Equipment Recommendations  Rolling walker (2 wheels)    Recommendations for Other Services       Precautions / Restrictions Precautions Precautions: Fall Recall of Precautions/Restrictions: Intact Restrictions Weight Bearing Restrictions Per Provider Order: No     Mobility  Bed Mobility               General bed mobility comments: up in recliner at start/end of session     Transfers Overall transfer level: Needs assistance Equipment used: Rolling walker (2 wheels) Transfers: Sit to/from Stand Sit to Stand: Supervision           General transfer comment: several attempts needed to be completed successfully but no physical assistance    Ambulation/Gait Ambulation/Gait assistance: Supervision Gait Distance (Feet):  (45ft, twice) Assistive device: Rolling walker (2 wheels) Gait Pattern/deviations: Step-through pattern, Decreased step length - right, Decreased step length - left, Trunk flexed Gait velocity: decreased         Stairs             Wheelchair Mobility     Tilt Bed    Modified Rankin (Stroke Patients Only)       Balance Overall balance assessment: Modified Independent, Mild deficits observed, not formally tested Sitting-balance support: Feet supported Sitting balance-Leahy Scale: Normal     Standing balance support: Bilateral upper extremity supported Standing balance-Leahy Scale: Good                              Communication    Cognition Arousal: Alert Behavior During Therapy: WFL for tasks assessed/performed   PT - Cognitive impairments: No apparent impairments                         Following commands: Intact      Cueing    Exercises      General Comments        Pertinent Vitals/Pain Pain Assessment Pain Assessment: No/denies pain    Home Living  Prior Function            PT Goals (current goals can now be found in the care plan section) Progress towards PT goals: Progressing toward goals    Frequency    Min 2X/week      PT Plan      Co-evaluation              AM-PAC PT 6 Clicks Mobility   Outcome Measure  Help needed turning from your back to your side while in a flat bed without using bedrails?: None Help needed moving from lying on your back to sitting on the side of a flat bed without using bedrails?:  None Help needed moving to and from a bed to a chair (including a wheelchair)?: None Help needed standing up from a chair using your arms (e.g., wheelchair or bedside chair)?: None Help needed to walk in hospital room?: None Help needed climbing 3-5 steps with a railing? : A Little 6 Click Score: 23    End of Session Equipment Utilized During Treatment: Oxygen  Activity Tolerance: Patient tolerated treatment well Patient left: in chair;with call bell/phone within reach;with chair alarm set;with nursing/sitter in room Nurse Communication: Mobility status PT Visit Diagnosis: Difficulty in walking, not elsewhere classified (R26.2);Muscle weakness (generalized) (M62.81)     Time: 9054-9040 PT Time Calculation (min) (ACUTE ONLY): 14 min  Charges:    $Therapeutic Activity: 8-22 mins PT General Charges $$ ACUTE PT VISIT: 1 Visit                     Doyal Shams PT, DPT 12:14 PM,12/14/2024

## 2024-12-14 NOTE — TOC Progression Note (Signed)
 Transition of Care (TOC) - Progression Note    Patient Details  Name: [name]. MRN: [id number] Date of Birth: 1940-12-11  [facility]) CM/SW Contact  Dalia GORMAN Fuse, RN Phone Number: 12/14/2024, 10:00 AM  Clinical Narrative:    Continuing prednisone  and duonebs. On 4 L , weaning O2. Plan to switch to PO lasix . Plan to dc to home with [facility] PT/OT when medically ready. Patient declined 3 in 1 [facility] and they received a RW last year.   TOC will continue to follow.                   Expected Discharge Plan and Services                                               Social Drivers of Health (SDOH) Interventions SDOH Screenings   Food Insecurity: No Food Insecurity (12/08/2024)  Housing: Low Risk (12/08/2024)  Transportation Needs: No Transportation Needs (12/08/2024)  Utilities: Not At Risk (12/08/2024)  Financial Resource Strain: Low Risk  (09/14/2023)   Received from [facility]  Social Connections: Moderately Integrated (12/08/2024)  Tobacco Use: Medium Risk (12/07/2024)    Readmission Risk Interventions     No data to display
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
## 2024-12-14 NOTE — Plan of Care (Signed)
" °  Problem: Education: Goal: Ability to describe self-care measures that may prevent or decrease complications (Diabetes Survival Skills Education) will improve Outcome: Progressing   Problem: Coping: Goal: Ability to adjust to condition or change in health will improve Outcome: Progressing   Problem: Fluid Volume: Goal: Ability to maintain a balanced intake and output will improve Outcome: Progressing   Problem: Health Behavior/Discharge Planning: Goal: Ability to identify and utilize available resources and services will improve Outcome: Progressing Goal: Ability to manage health-related needs will improve Outcome: Progressing   Problem: Metabolic: Goal: Ability to maintain appropriate glucose levels will improve Outcome: Progressing   Problem: Nutritional: Goal: Maintenance of adequate nutrition will improve Outcome: Progressing Goal: Progress toward achieving an optimal weight will improve Outcome: Progressing   Problem: Skin Integrity: Goal: Risk for impaired skin integrity will decrease Outcome: Progressing   Problem: Tissue Perfusion: Goal: Adequacy of tissue perfusion will improve Outcome: Progressing   Problem: Education: Goal: Knowledge of General Education information will improve Description: Including pain rating scale, medication(s)/side effects and non-pharmacologic comfort measures Outcome: Progressing   Problem: Health Behavior/Discharge Planning: Goal: Ability to manage health-related needs will improve Outcome: Progressing   Problem: Clinical Measurements: Goal: Ability to maintain clinical measurements within normal limits will improve Outcome: Progressing Goal: Will remain free from infection Outcome: Progressing Goal: Diagnostic test results will improve Outcome: Progressing Goal: Respiratory complications will improve Outcome: Progressing Goal: Cardiovascular complication will be avoided Outcome: Progressing   Problem: Activity: Goal:  Risk for activity intolerance will decrease Outcome: Progressing   Problem: Nutrition: Goal: Adequate nutrition will be maintained Outcome: Progressing   Problem: Coping: Goal: Level of anxiety will decrease Outcome: Progressing   Problem: Elimination: Goal: Will not experience complications related to bowel motility Outcome: Progressing Goal: Will not experience complications related to urinary retention Outcome: Progressing   Problem: Pain Managment: Goal: General experience of comfort will improve and/or be controlled Outcome: Progressing   Problem: Safety: Goal: Ability to remain free from injury will improve Outcome: Progressing   Problem: Skin Integrity: Goal: Risk for impaired skin integrity will decrease Outcome: Progressing   Problem: Education: Goal: Ability to demonstrate management of disease process will improve Outcome: Progressing Goal: Ability to verbalize understanding of medication therapies will improve Outcome: Progressing Goal: Individualized Educational Video(s) Outcome: Progressing   Problem: Activity: Goal: Capacity to carry out activities will improve Outcome: Progressing   Problem: Cardiac: Goal: Ability to achieve and maintain adequate cardiopulmonary perfusion will improve Outcome: Progressing   Problem: Education: Goal: Knowledge of disease or condition will improve Outcome: Progressing Goal: Knowledge of the prescribed therapeutic regimen will improve Outcome: Progressing Goal: Individualized Educational Video(s) Outcome: Progressing   Problem: Activity: Goal: Ability to tolerate increased activity will improve Outcome: Progressing Goal: Will verbalize the importance of balancing activity with adequate rest periods Outcome: Progressing   Problem: Respiratory: Goal: Ability to maintain a clear airway will improve Outcome: Progressing Goal: Levels of oxygenation will improve Outcome: Progressing Goal: Ability to maintain  adequate ventilation will improve Outcome: Progressing   "

## 2024-12-15 DIAGNOSIS — J9621 Acute and chronic respiratory failure with hypoxia: Secondary | ICD-10-CM | POA: Diagnosis not present

## 2024-12-15 LAB — CBC WITH DIFFERENTIAL/PLATELET
Abs Immature Granulocytes: 0.2 K/uL — ABNORMAL HIGH (ref 0.00–0.07)
Basophils Absolute: 0 K/uL (ref 0.0–0.1)
Basophils Relative: 0 %
Eosinophils Absolute: 0.3 K/uL (ref 0.0–0.5)
Eosinophils Relative: 3 %
HCT: 40.2 % (ref 39.0–52.0)
Hemoglobin: 13.2 g/dL (ref 13.0–17.0)
Immature Granulocytes: 2 %
Lymphocytes Relative: 9 %
Lymphs Abs: 0.9 K/uL (ref 0.7–4.0)
MCH: 34.5 pg — ABNORMAL HIGH (ref 26.0–34.0)
MCHC: 32.8 g/dL (ref 30.0–36.0)
MCV: 105 fL — ABNORMAL HIGH (ref 80.0–100.0)
Monocytes Absolute: 0.6 K/uL (ref 0.1–1.0)
Monocytes Relative: 6 %
Neutro Abs: 8.2 K/uL — ABNORMAL HIGH (ref 1.7–7.7)
Neutrophils Relative %: 80 %
Platelets: 111 K/uL — ABNORMAL LOW (ref 150–400)
RBC: 3.83 MIL/uL — ABNORMAL LOW (ref 4.22–5.81)
RDW: 15.9 % — ABNORMAL HIGH (ref 11.5–15.5)
WBC: 10.2 K/uL (ref 4.0–10.5)
nRBC: 0 % (ref 0.0–0.2)

## 2024-12-15 LAB — BASIC METABOLIC PANEL WITH GFR
Anion gap: 7 (ref 5–15)
BUN: 38 mg/dL — ABNORMAL HIGH (ref 8–23)
CO2: 33 mmol/L — ABNORMAL HIGH (ref 22–32)
Calcium: 9 mg/dL (ref 8.9–10.3)
Chloride: 103 mmol/L (ref 98–111)
Creatinine, Ser: 0.92 mg/dL (ref 0.61–1.24)
GFR, Estimated: >60 mL/min
Glucose, Bld: 113 mg/dL — ABNORMAL HIGH (ref 70–99)
Potassium: 3.7 mmol/L (ref 3.5–5.1)
Sodium: 143 mmol/L (ref 135–145)

## 2024-12-15 LAB — GLUCOSE, CAPILLARY
Glucose-Capillary: 111 mg/dL — ABNORMAL HIGH (ref 70–99)
Glucose-Capillary: 131 mg/dL — ABNORMAL HIGH (ref 70–99)
Glucose-Capillary: 140 mg/dL — ABNORMAL HIGH (ref 70–99)
Glucose-Capillary: 90 mg/dL (ref 70–99)

## 2024-12-15 LAB — MAGNESIUM: Magnesium: 2.4 mg/dL (ref 1.7–2.4)

## 2024-12-15 LAB — C-REACTIVE PROTEIN: CRP: 6.4 mg/dL — ABNORMAL HIGH

## 2024-12-15 MED ORDER — DEXAMETHASONE SOD PHOSPHATE PF 10 MG/ML IJ SOLN
8.0000 mg | Freq: Two times a day (BID) | INTRAMUSCULAR | Status: DC
  Administered 2024-12-15 – 2024-12-17 (×5): 8 mg via INTRAVENOUS
  Filled 2024-12-15 (×5): qty 1

## 2024-12-15 MED ORDER — PREDNISONE 2.5 MG PO TABS
2.5000 mg | ORAL_TABLET | Freq: Every day | ORAL | Status: DC
  Administered 2024-12-15: 2.5 mg via ORAL
  Filled 2024-12-15: qty 1
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
## 2024-12-15 NOTE — Consult Note (Signed)
 "    PULMONOLOGY         Date: 12/15/2024,   MRN# [id number] [name]. 10-31-1941     AdmissionWeight: 102.1 kg                 CurrentWeight: 98.6 kg  Referring provider: Dr Franchot   CHIEF COMPLAINT:   Acute on chronic hypoxemic respiratory failure   HISTORY OF PRESENT ILLNESS   This 84 year old very pleasant male with known interstitial lung disease and CKD as well as background history of COVID-19, sleep apnea, pulmonary fibrosis, essential hypertension, CHF, who came in with acute on chronic hypoxemic respiratory failure with what appears to be an underlying ILD with acute exacerbation.  Patient has been treated with antibiotics as well as steroids and has partially improved with weaning down of oxygen  from 6  to 3 L nasal cannula however continues to desaturate with any activity including ambulation to mid 80s.  His last x-ray was performed January 1 and it shows chronic interstitial lung disease without any changes and no evidence of acute pneumonia.  PCCM consultation placed for additional evaluation and management of complex comorbid pulmonary patient with acute exacerbation of underlying interstitial lung disease   PAST MEDICAL HISTORY   Past Medical History:  Diagnosis Date   Allergy    Arthritis    Coronary artery disease    COVID-19 11/19/2019   Dysrhythmia    Hypertension    Peripheral vascular disease    Pulmonary fibrosis (HCC)    Sleep apnea    CPAP   Tremor      SURGICAL HISTORY   Past Surgical History:  Procedure Laterality Date   CARDIAC SURGERY     CARDIOVERSION N/A 03/03/2018   Procedure: CARDIOVERSION;  Surgeon: Hester Wolm PARAS, MD;  Location: [facility];  Service: Cardiovascular;  Laterality: N/A;   CARDIOVERSION N/A 05/24/2018   Procedure: CARDIOVERSION;  Surgeon: Hester Wolm PARAS, MD;  Location: [facility];  Service: Cardiovascular;  Laterality: N/A;   CATARACT EXTRACTION W/PHACO Right 06/03/2020   Procedure: CATARACT  EXTRACTION PHACO AND INTRAOCULAR LENS PLACEMENT (IOC) RIGHT 6.07  00:48.0;  Surgeon: Myrna Adine Anes, MD;  Location: [facility];  Service: Ophthalmology;  Laterality: Right;  sleep apnea   CORONARY ARTERY BYPASS GRAFT     EYE SURGERY     LEG SURGERY Left    stents placed, Fem-Fem bypass   LOWER EXTREMITY ANGIOGRAPHY Left 09/01/2022   Procedure: Lower Extremity Angiography;  Surgeon: Jama Cordella MATSU, MD;  Location: [facility];  Service: Cardiovascular;  Laterality: Left;   RIGHT HEART CATH Right 07/24/2024   Procedure: RIGHT HEART CATH;  Surgeon: Florencio Cara BIRCH, MD;  Location: [facility];  Service: Cardiovascular;  Laterality: Right;   RIGHT/LEFT HEART CATH AND CORONARY ANGIOGRAPHY Bilateral 02/12/2022   Procedure: RIGHT/LEFT HEART CATH AND CORONARY ANGIOGRAPHY;  Surgeon: Hester Wolm PARAS, MD;  Location: [facility];  Service: Cardiovascular;  Laterality: Bilateral;   TONSILLECTOMY       FAMILY HISTORY   Family History  Problem Relation Age of Onset   Thyroid  disease Mother    Macular degeneration Mother    Cancer Father      SOCIAL HISTORY   Social History[1]   MEDICATIONS    Home Medication:    Current Medication: Current Medications[2]    ALLERGIES   Fish allergy, Spironolactone, Hydrocodone , Tramadol , Hydrochlorothiazide, Iodine , and Shellfish allergy     REVIEW OF SYSTEMS    Review  of Systems:  Gen:  Denies  fever, sweats, chills weigh loss  HEENT: Denies blurred vision, double vision, ear pain, eye pain, hearing loss, nose bleeds, sore throat Cardiac:  No dizziness, chest pain or heaviness, chest tightness,edema Resp:   reports dyspnea chronically  Gi: Denies swallowing difficulty, stomach pain, nausea or vomiting, diarrhea, constipation, bowel incontinence Gu:  Denies bladder incontinence, burning urine Ext:   Denies Joint pain, stiffness or swelling Skin: Denies  skin rash, easy bruising or bleeding or  hives Endoc:  Denies polyuria, polydipsia , polyphagia or weight change Psych:   Denies depression, insomnia or hallucinations   Other:  All other systems negative   VS: BP 122/84 (BP Location: Right Arm)   Pulse 90   Temp 97.7 F (36.5 C)   Resp 18   Ht 5' 11 (1.803 m)   Wt 98.6 kg   SpO2 93%   BMI 30.32 kg/m      PHYSICAL EXAM    GENERAL:NAD, no fevers, chills, no weakness no fatigue HEAD: Normocephalic, atraumatic.  EYES: Pupils equal, round, reactive to light. Extraocular muscles intact. No scleral icterus.  MOUTH: Moist mucosal membrane. Dentition intact. No abscess noted.  EAR, NOSE, THROAT: Clear without exudates. No external lesions.  NECK: Supple. No thyromegaly. No nodules. No JVD.  PULMONARY: decreased breath sounds with mild rhonchi worse at bases bilaterally.  CARDIOVASCULAR: S1 and S2. Regular rate and rhythm. No murmurs, rubs, or gallops. No edema. Pedal pulses 2+ bilaterally.  GASTROINTESTINAL: Soft, nontender, nondistended. No masses. Positive bowel sounds. No hepatosplenomegaly.  MUSCULOSKELETAL: No swelling, clubbing, or edema. Range of motion full in all extremities.  NEUROLOGIC: Cranial nerves II through XII are intact. No gross focal neurological deficits. Sensation intact. Reflexes intact.  SKIN: No ulceration, lesions, rashes, or cyanosis. Skin warm and dry. Turgor intact.  PSYCHIATRIC: Mood, affect within normal limits. The patient is awake, alert and oriented x 3. Insight, judgment intact.       IMAGING     ASSESSMENT/PLAN   Acute exacerbation of ILD      Patient with hypoxemia with pulmonary fibrosis     - continue methotrexate       - continue IS and Flutter valve     - continue steroids increase to decadron  8 bid       - bactrim  three times per week     - morphine  PRN     Atelectasis at bases bilaterally     - metaneb TID with RT   Acute on chronic systolic CHF exacerbation      Continue diuresis with torsemide     Strict  Is&Os            Thank you for allowing me to participate in the care of this patient.   Patient/Family are satisfied with care plan and all questions have been answered.    Provider disclosure: Patient with at least one acute or chronic illness or injury that poses a threat to life or bodily function and is being managed actively during this encounter.  All of the below services have been performed independently by signing provider:  review of prior documentation from internal and or external health records.  Review of previous and current lab results.  Interview and comprehensive assessment during patient visit today. Review of current and previous chest radiographs/CT scans. Discussion of management and test interpretation with health care team and patient/family.   This document was prepared using Dragon voice recognition software and may include unintentional dictation errors.  Joliet Mallozzi, M.D.  Division of Pulmonary & Critical Care Medicine             [1]  Social History Tobacco Use   Smoking status: Former    Current packs/day: 0.00    Types: Cigarettes    Quit date: 02/16/1979    Years since quitting: 45.8   Smokeless tobacco: Never  Vaping Use   Vaping status: Never Used  Substance Use Topics   Alcohol  use: Yes    Comment: rarely   Drug use: No  [2]  Current Facility-Administered Medications:    acetaminophen  (TYLENOL ) tablet 650 mg, 650 mg, Oral, Q6H PRN **OR** acetaminophen  (TYLENOL ) suppository 650 mg, 650 mg, Rectal, Q6H PRN, Cleatus Delayne GAILS, MD   albuterol  (PROVENTIL ) (2.5 MG/3ML) 0.083% nebulizer solution 2.5 mg, 2.5 mg, Nebulization, Q2H PRN, Cleatus Delayne GAILS, MD   allopurinol  (ZYLOPRIM ) tablet 100 mg, 100 mg, Oral, Daily, Cleatus Delayne V, MD, 100 mg at 12/15/24 1003   apixaban  (ELIQUIS ) tablet 5 mg, 5 mg, Oral, BID, Cleatus Delayne GAILS, MD, 5 mg at 12/15/24 1003   atorvastatin  (LIPITOR) tablet 40 mg, 40 mg, Oral, Daily, Cleatus Delayne GAILS, MD,  40 mg at 12/14/24 8177   diltiazem  (CARDIZEM  CD) 24 hr capsule 240 mg, 240 mg, Oral, Daily, Awanda City, MD, 240 mg at 12/15/24 1003   DULoxetine  (CYMBALTA ) DR capsule 60 mg, 60 mg, Oral, Daily, Cleatus Delayne V, MD, 60 mg at 12/15/24 1003   feeding supplement (ENSURE PLUS HIGH PROTEIN) liquid 237 mL, 237 mL, Oral, BID BM, Awanda City, MD, 237 mL at 12/15/24 1004   folic acid  (FOLVITE ) tablet 1 mg, 1 mg, Oral, Daily, Awanda [place], MD, 1 mg at 12/15/24 1003   guaiFENesin -dextromethorphan (ROBITUSSIN DM) 100-10 MG/5ML syrup 5 mL, 5 mL, Oral, Q4H PRN, Cleatus Delayne GAILS, MD   hydrALAZINE  (APRESOLINE ) injection 10 mg, 10 mg, Intravenous, Q6H PRN, Awanda [place], MD, 10 mg at 12/08/24 1350   HYDROcodone -acetaminophen  (NORCO/VICODIN) 5-325 MG per tablet 1-2 tablet, 1-2 tablet, Oral, Q4H PRN, Cleatus Delayne GAILS, MD   insulin  aspart (novoLOG ) injection 0-15 Units, 0-15 Units, Subcutaneous, TID WC, Cleatus Delayne GAILS, MD, 2 Units at 12/15/24 1223   insulin  aspart (novoLOG ) injection 0-5 Units, 0-5 Units, Subcutaneous, QHS, Cleatus Delayne GAILS, MD, 2 Units at 12/07/24 2326   levothyroxine  (SYNTHROID ) tablet 25 mcg, 25 mcg, Oral, Q0600, Cleatus Delayne GAILS, MD, 25 mcg at 12/15/24 9372   methotrexate  (RHEUMATREX) tablet 10 mg, 10 mg, Oral, Weekly, Cleatus Delayne GAILS, MD, 10 mg at 12/10/24 9166   morphine  (PF) 2 MG/ML injection 2 mg, 2 mg, Intravenous, Q2H PRN, Cleatus Delayne GAILS, MD   ondansetron  (ZOFRAN ) tablet 4 mg, 4 mg, Oral, Q6H PRN, 4 mg at 12/14/24 0953 **OR** ondansetron  (ZOFRAN ) injection 4 mg, 4 mg, Intravenous, Q6H PRN, Cleatus Delayne GAILS, MD   predniSONE  (DELTASONE ) tablet 2.5 mg, 2.5 mg, Oral, Q breakfast, Franchot Novel, MD, 2.5 mg at 12/15/24 1002   sulfamethoxazole -trimethoprim  (BACTRIM ) 400-80 MG per tablet 1 tablet, 1 tablet, Oral, Once per day on Monday Wednesday Friday, Cleatus Delayne GAILS, MD, 1 tablet at 12/15/24 1003   torsemide  (DEMADEX ) tablet 20 mg, 20 mg, Oral, Daily, Franchot Novel, MD, 20 mg at 12/15/24 1003   Vitamin  D (Ergocalciferol ) (DRISDOL ) 1.25 MG (50000 UNIT) capsule 50,000 Units, 50,000 Units, Oral, Q7 days, Franchot Novel, MD, 50,000 Units at 12/14/24 1822  "

## 2024-12-15 NOTE — Plan of Care (Signed)
" °  Problem: Education: Goal: Ability to describe self-care measures that may prevent or decrease complications (Diabetes Survival Skills Education) will improve Outcome: Progressing   Problem: Coping: Goal: Ability to adjust to condition or change in health will improve Outcome: Progressing   Problem: Fluid Volume: Goal: Ability to maintain a balanced intake and output will improve Outcome: Progressing   Problem: Health Behavior/Discharge Planning: Goal: Ability to identify and utilize available resources and services will improve Outcome: Progressing Goal: Ability to manage health-related needs will improve Outcome: Progressing   Problem: Metabolic: Goal: Ability to maintain appropriate glucose levels will improve Outcome: Progressing   Problem: Nutritional: Goal: Maintenance of adequate nutrition will improve Outcome: Progressing Goal: Progress toward achieving an optimal weight will improve Outcome: Progressing   Problem: Skin Integrity: Goal: Risk for impaired skin integrity will decrease Outcome: Progressing   Problem: Tissue Perfusion: Goal: Adequacy of tissue perfusion will improve Outcome: Progressing   Problem: Education: Goal: Knowledge of General Education information will improve Description: Including pain rating scale, medication(s)/side effects and non-pharmacologic comfort measures Outcome: Progressing   Problem: Health Behavior/Discharge Planning: Goal: Ability to manage health-related needs will improve Outcome: Progressing   Problem: Clinical Measurements: Goal: Ability to maintain clinical measurements within normal limits will improve Outcome: Progressing Goal: Will remain free from infection Outcome: Progressing Goal: Diagnostic test results will improve Outcome: Progressing Goal: Respiratory complications will improve Outcome: Progressing Goal: Cardiovascular complication will be avoided Outcome: Progressing   Problem: Activity: Goal:  Risk for activity intolerance will decrease Outcome: Progressing   Problem: Nutrition: Goal: Adequate nutrition will be maintained Outcome: Progressing   Problem: Coping: Goal: Level of anxiety will decrease Outcome: Progressing   Problem: Elimination: Goal: Will not experience complications related to bowel motility Outcome: Progressing Goal: Will not experience complications related to urinary retention Outcome: Progressing   Problem: Pain Managment: Goal: General experience of comfort will improve and/or be controlled Outcome: Progressing   Problem: Safety: Goal: Ability to remain free from injury will improve Outcome: Progressing   Problem: Skin Integrity: Goal: Risk for impaired skin integrity will decrease Outcome: Progressing   Problem: Education: Goal: Ability to demonstrate management of disease process will improve Outcome: Progressing Goal: Ability to verbalize understanding of medication therapies will improve Outcome: Progressing Goal: Individualized Educational Video(s) Outcome: Progressing   Problem: Activity: Goal: Capacity to carry out activities will improve Outcome: Progressing   Problem: Cardiac: Goal: Ability to achieve and maintain adequate cardiopulmonary perfusion will improve Outcome: Progressing   Problem: Education: Goal: Knowledge of disease or condition will improve Outcome: Progressing Goal: Knowledge of the prescribed therapeutic regimen will improve Outcome: Progressing Goal: Individualized Educational Video(s) Outcome: Progressing   Problem: Activity: Goal: Ability to tolerate increased activity will improve Outcome: Progressing Goal: Will verbalize the importance of balancing activity with adequate rest periods Outcome: Progressing   Problem: Respiratory: Goal: Ability to maintain a clear airway will improve Outcome: Progressing Goal: Levels of oxygenation will improve Outcome: Progressing Goal: Ability to maintain  adequate ventilation will improve Outcome: Progressing   "

## 2024-12-15 NOTE — Plan of Care (Signed)
" °  Problem: Education: Goal: Ability to describe self-care measures that may prevent or decrease complications (Diabetes Survival Skills Education) will improve Outcome: Progressing   Problem: Nutritional: Goal: Maintenance of adequate nutrition will improve Outcome: Progressing   Problem: Skin Integrity: Goal: Risk for impaired skin integrity will decrease Outcome: Progressing   Problem: Tissue Perfusion: Goal: Adequacy of tissue perfusion will improve Outcome: Progressing   Problem: Education: Goal: Knowledge of General Education information will improve Description: Including pain rating scale, medication(s)/side effects and non-pharmacologic comfort measures Outcome: Progressing   Problem: Activity: Goal: Risk for activity intolerance will decrease Outcome: Progressing   Problem: Nutrition: Goal: Adequate nutrition will be maintained Outcome: Progressing   Problem: Coping: Goal: Level of anxiety will decrease Outcome: Progressing   Problem: Elimination: Goal: Will not experience complications related to bowel motility Outcome: Progressing   Problem: Pain Managment: Goal: General experience of comfort will improve and/or be controlled Outcome: Progressing   Problem: Safety: Goal: Ability to remain free from injury will improve Outcome: Progressing   "

## 2024-12-15 NOTE — Progress Notes (Signed)
 Occupational Therapy Treatment Patient Details Name: [name]. MRN: [id number] DOB: October 11, 1941 Today's Date: 12/15/2024   History of present illness Pt is an 84 y.o. 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  MD assessment includes: acute on chronic respiratory failure with hypoxia, right seventh rib and T8 fracture on CT 12/05/2024, acute on chronic systolic CHF, and physical deconditioning.   OT comments  Patient seen for OT treatment on this date. Upon arrival to room patient walking in bathroom had been incontinent on floor and didn't have A; OT provided A with set up/clean up and instructed on need to call for assist to ensure safety, patient agreeable. Patient on 3L and satting 88%; couldn't achieve <90, increased O2 to 5L and patient achieved 92%; patient ambulated on 5L in room with SBA and O2 remained 91%. Dropped O2 to 3L and instructed on PLB and patient was able to return demo using incentive spirometer and flutter valve. Patients wife is now not able to provide anticipated assist at home due to own medical limitations, OT discussed changing discharge recommendation to SNF, patient hesistant/refusing. OT discussed concerns and patient states understanding but just reports we will see. Patient ended treatment in recliner with bed/chair alarm on and all needs within reach. Patient making good progress toward goals, will continue to follow POC. Discharge recommendation remains appropriate.        If plan is discharge home, recommend the following:  A little help with walking and/or transfers;A  little help with bathing/dressing/bathroom;Supervision due to cognitive status   Equipment Recommendations  None recommended by OT    Recommendations for Other Services      Precautions / Restrictions Precautions Precautions: Fall Recall of Precautions/Restrictions: Impaired Restrictions Weight Bearing Restrictions Per Provider Order: No Other Position/Activity Restrictions: Watch SpO2 and RR       Mobility Bed Mobility               General bed mobility comments: OOB pre/post tx    Transfers Overall transfer level: Needs assistance Equipment used: Rolling walker (2 wheels) Transfers: Sit to/from Stand Sit to Stand: Supervision           General transfer comment: several attempts needed to be completed successfully but no physical assistance     Balance Overall balance assessment: Modified Independent, Mild deficits observed, not formally tested Sitting-balance support: Feet supported Sitting balance-Leahy Scale: Normal     Standing balance support: Bilateral upper extremity supported Standing balance-Leahy Scale: Good                             ADL either performed or assessed with clinical judgement   ADL Overall ADL's : Needs assistance/impaired     Grooming: Supervision/safety;Standing               Lower Body Dressing: Moderate assistance;Sit to/from stand Lower Body Dressing Details (indicate cue type and reason): A to thread RLE Toilet Transfer: Contact guard assist;Regular Toilet;Rolling walker (2 wheels)   Toileting- Clothing Manipulation and Hygiene: Contact guard assist;Sit to/from stand         General ADL Comments: poor safety awareness    Extremity/Trunk  Assessment              Vision       Perception     Praxis     Communication Communication Communication: No apparent difficulties   Cognition Arousal: Alert Behavior During Therapy: WFL for tasks assessed/performed Cognition: No apparent  impairments                               Following commands: Intact        Cueing   Cueing Techniques: Verbal cues  Exercises      Shoulder Instructions       General Comments      Pertinent Vitals/ Pain       Pain Assessment Pain Assessment: No/denies pain  Home Living Family/patient expects to be discharged to:: Private residence                                        Prior Functioning/Environment              Frequency  Min 2X/week        Progress Toward Goals  OT Goals(current goals can now be found in the care plan section)  Progress towards OT goals: Progressing toward goals  Acute Rehab OT Goals Patient Stated Goal: to go home OT Goal Formulation: With patient Time For Goal Achievement: 12/22/24 Potential to Achieve Goals: Good ADL Goals Pt Will Perform Grooming: with modified independence;standing Pt Will Perform Lower Body Dressing: with modified independence;sit to/from stand Pt Will Transfer to Toilet: with modified independence;ambulating;regular height toilet  Plan      Co-evaluation                 AM-PAC OT 6 Clicks Daily Activity     Outcome Measure   Help from another person eating meals?: None Help from another person taking care of personal grooming?: A Little Help from another person toileting, which includes using toliet, bedpan, or urinal?: A Little Help from another person bathing (including washing, rinsing, drying)?: A Little Help from another person to put on and taking off regular upper body clothing?: None Help from another person to put on and taking off regular lower body clothing?: A Little 6 Click Score: 20    End of Session Equipment Utilized During Treatment: Rolling walker (2 wheels);Oxygen   OT Visit Diagnosis: Other abnormalities of gait and mobility (R26.89);Muscle weakness (generalized) (M62.81)   Activity Tolerance Patient tolerated treatment well   Patient Left  in chair;with chair alarm set;with call bell/phone within reach;with nursing/sitter in room   Nurse Communication Mobility status        Time: 8879-8850 OT Time Calculation (min): 29 min  Charges: OT General Charges $OT Visit: 1 Visit OT Treatments $Self Care/Home Management : 23-37 mins  Rogers Clause, OT/L MSOT, 12/15/2024

## 2024-12-15 NOTE — TOC Progression Note (Signed)
 Transition of Care (TOC) - Progression Note    Patient Details  Name: [name]. MRN: [id number] Date of Birth: 06/28/1941  [facility]) CM/SW Contact  Dalia GORMAN Fuse, RN Phone Number: 12/15/2024, 9:18 AM  Clinical Narrative:     On 3 L Cecilia (baseline O2), desatted to 86% after ambulating. Completed 6 days of steroids, consulting with pulm to see if additional steroids are needed and continuing duo nebs. Transitioning to po diuretics.   Plan for Home with [facility] PT/OT when medically clear.                     Expected Discharge Plan and Services                                               Social Drivers of Health (SDOH) Interventions SDOH Screenings   Food Insecurity: No Food Insecurity (12/08/2024)  Housing: Low Risk (12/08/2024)  Transportation Needs: No Transportation Needs (12/08/2024)  Utilities: Not At Risk (12/08/2024)  Financial Resource Strain: Low Risk  (09/14/2023)   Received from [facility]  Social Connections: Moderately Integrated (12/08/2024)  Tobacco Use: Medium Risk (12/07/2024)    Readmission Risk Interventions     No data to display

## 2024-12-15 NOTE — Hospital Course (Signed)
 [name]. is a 84 y.o. male with medical history significant for ILD on methotrexate /chronic steroids, OSA -CPAP intolerant, pulmonary hypertension, and chronic hypoxic respiratory failure on 3 L nasal cannula, HTN, DM, HFimEF, atrial fibrillation on Eliquis , CAD s/p CABG,  and chronic venous stasis.   He presented with acute on chronic hypoxic respiratory failure requiring up to 6L of O2. He was was diuresed and treated for acute exacerbation of his ILD (s/p 2 doses of Solumedrol 40mg  and 4d of prednisone  40mg ). His O2 requirement is back to 3L at rest, but yesterday after exertion his O2 sats were 86% on 3L. I am reaching out to see if you would resume steroids at prednisone  40mg  and slowly taper or if you would resume his home steroid dose of 2.5mg .

## 2024-12-16 DIAGNOSIS — J9621 Acute and chronic respiratory failure with hypoxia: Secondary | ICD-10-CM | POA: Diagnosis not present

## 2024-12-16 LAB — CBC
HCT: 40.5 % (ref 39.0–52.0)
Hemoglobin: 13.3 g/dL (ref 13.0–17.0)
MCH: 34.2 pg — ABNORMAL HIGH (ref 26.0–34.0)
MCHC: 32.8 g/dL (ref 30.0–36.0)
MCV: 104.1 fL — ABNORMAL HIGH (ref 80.0–100.0)
Platelets: 119 K/uL — ABNORMAL LOW (ref 150–400)
RBC: 3.89 MIL/uL — ABNORMAL LOW (ref 4.22–5.81)
RDW: 15.9 % — ABNORMAL HIGH (ref 11.5–15.5)
WBC: 10 K/uL (ref 4.0–10.5)
nRBC: 0 % (ref 0.0–0.2)

## 2024-12-16 LAB — BASIC METABOLIC PANEL WITH GFR
Anion gap: 10 (ref 5–15)
BUN: 41 mg/dL — ABNORMAL HIGH (ref 8–23)
CO2: 31 mmol/L (ref 22–32)
Calcium: 9.1 mg/dL (ref 8.9–10.3)
Chloride: 100 mmol/L (ref 98–111)
Creatinine, Ser: 1.09 mg/dL (ref 0.61–1.24)
GFR, Estimated: >60 mL/min
Glucose, Bld: 152 mg/dL — ABNORMAL HIGH (ref 70–99)
Potassium: 4.4 mmol/L (ref 3.5–5.1)
Sodium: 141 mmol/L (ref 135–145)

## 2024-12-16 LAB — ANA W/REFLEX: Anti Nuclear Antibody (ANA): NEGATIVE

## 2024-12-16 LAB — MAGNESIUM: Magnesium: 2.6 mg/dL — ABNORMAL HIGH (ref 1.7–2.4)

## 2024-12-16 LAB — GLUCOSE, CAPILLARY
Glucose-Capillary: 178 mg/dL — ABNORMAL HIGH (ref 70–99)
Glucose-Capillary: 227 mg/dL — ABNORMAL HIGH (ref 70–99)
Glucose-Capillary: 193 mg/dL — ABNORMAL HIGH (ref 70–99)
Glucose-Capillary: 192 mg/dL — ABNORMAL HIGH (ref 70–99)

## 2024-12-16 MED ORDER — MYCOPHENOLATE MOFETIL HCL 500 MG IV SOLR
1000.0000 mg | Freq: Two times a day (BID) | INTRAVENOUS | Status: DC
  Administered 2024-12-16 – 2024-12-18 (×4): 1000 mg via INTRAVENOUS
  Filled 2024-12-16 (×5): qty 30
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
## 2024-12-16 NOTE — Plan of Care (Signed)
" °  Problem: Education: Goal: Ability to describe self-care measures that may prevent or decrease complications (Diabetes Survival Skills Education) will improve Outcome: Progressing   Problem: Coping: Goal: Ability to adjust to condition or change in health will improve Outcome: Progressing   Problem: Fluid Volume: Goal: Ability to maintain a balanced intake and output will improve Outcome: Progressing   Problem: Health Behavior/Discharge Planning: Goal: Ability to identify and utilize available resources and services will improve Outcome: Progressing Goal: Ability to manage health-related needs will improve Outcome: Progressing   Problem: Metabolic: Goal: Ability to maintain appropriate glucose levels will improve Outcome: Progressing   Problem: Nutritional: Goal: Maintenance of adequate nutrition will improve Outcome: Progressing Goal: Progress toward achieving an optimal weight will improve Outcome: Progressing   Problem: Skin Integrity: Goal: Risk for impaired skin integrity will decrease Outcome: Progressing   Problem: Tissue Perfusion: Goal: Adequacy of tissue perfusion will improve Outcome: Progressing   Problem: Education: Goal: Knowledge of General Education information will improve Description: Including pain rating scale, medication(s)/side effects and non-pharmacologic comfort measures Outcome: Progressing   Problem: Health Behavior/Discharge Planning: Goal: Ability to manage health-related needs will improve Outcome: Progressing   Problem: Clinical Measurements: Goal: Ability to maintain clinical measurements within normal limits will improve Outcome: Progressing Goal: Will remain free from infection Outcome: Progressing Goal: Diagnostic test results will improve Outcome: Progressing Goal: Respiratory complications will improve Outcome: Progressing Goal: Cardiovascular complication will be avoided Outcome: Progressing   Problem: Activity: Goal:  Risk for activity intolerance will decrease Outcome: Progressing   Problem: Nutrition: Goal: Adequate nutrition will be maintained Outcome: Progressing   Problem: Coping: Goal: Level of anxiety will decrease Outcome: Progressing   Problem: Elimination: Goal: Will not experience complications related to bowel motility Outcome: Progressing Goal: Will not experience complications related to urinary retention Outcome: Progressing   Problem: Pain Managment: Goal: General experience of comfort will improve and/or be controlled Outcome: Progressing   Problem: Safety: Goal: Ability to remain free from injury will improve Outcome: Progressing   Problem: Skin Integrity: Goal: Risk for impaired skin integrity will decrease Outcome: Progressing   Problem: Education: Goal: Ability to demonstrate management of disease process will improve Outcome: Progressing Goal: Ability to verbalize understanding of medication therapies will improve Outcome: Progressing Goal: Individualized Educational Video(s) Outcome: Progressing   Problem: Activity: Goal: Capacity to carry out activities will improve Outcome: Progressing   Problem: Cardiac: Goal: Ability to achieve and maintain adequate cardiopulmonary perfusion will improve Outcome: Progressing   Problem: Education: Goal: Knowledge of disease or condition will improve Outcome: Progressing Goal: Knowledge of the prescribed therapeutic regimen will improve Outcome: Progressing Goal: Individualized Educational Video(s) Outcome: Progressing   Problem: Activity: Goal: Ability to tolerate increased activity will improve Outcome: Progressing Goal: Will verbalize the importance of balancing activity with adequate rest periods Outcome: Progressing   Problem: Respiratory: Goal: Ability to maintain a clear airway will improve Outcome: Progressing Goal: Levels of oxygenation will improve Outcome: Progressing Goal: Ability to maintain  adequate ventilation will improve Outcome: Progressing   "

## 2024-12-16 NOTE — Plan of Care (Signed)
" °  Problem: Education: Goal: Ability to describe self-care measures that may prevent or decrease complications (Diabetes Survival Skills Education) will improve Outcome: Progressing   Problem: Coping: Goal: Ability to adjust to condition or change in health will improve Outcome: Progressing   Problem: Nutritional: Goal: Maintenance of adequate nutrition will improve Outcome: Progressing   Problem: Skin Integrity: Goal: Risk for impaired skin integrity will decrease Outcome: Progressing   Problem: Tissue Perfusion: Goal: Adequacy of tissue perfusion will improve Outcome: Progressing   Problem: Coping: Goal: Level of anxiety will decrease Outcome: Progressing   Problem: Pain Managment: Goal: General experience of comfort will improve and/or be controlled Outcome: Progressing   Problem: Safety: Goal: Ability to remain free from injury will improve Outcome: Progressing   "

## 2024-12-16 NOTE — Progress Notes (Signed)
 "    PULMONOLOGY         Date: 12/16/2024,   MRN# [id number] [name]. [birth year]     AdmissionWeight: 102.1 kg                 CurrentWeight: 96.4 kg  Referring provider: Dr Franchot   CHIEF COMPLAINT:   Acute on chronic hypoxemic respiratory failure   HISTORY OF PRESENT ILLNESS   This 84 year old very pleasant male with known interstitial lung disease and CKD as well as background history of COVID-19, sleep apnea, pulmonary fibrosis, essential hypertension, CHF, who came in with acute on chronic hypoxemic respiratory failure with what appears to be an underlying ILD with acute exacerbation.  Patient has been treated with antibiotics as well as steroids and has partially improved with weaning down of oxygen  from 6  to 3 L nasal cannula however continues to desaturate with any activity including ambulation to mid 80s.  His last x-ray was performed January 1 and it shows chronic interstitial lung disease without any changes and no evidence of acute pneumonia.  PCCM consultation placed for additional evaluation and management of complex comorbid pulmonary patient with acute exacerbation of underlying interstitial lung disease  12/16/24- patient has had 2 recruitment treatments with Metaneb device.  He is able to take deeper breaths now, this morning using IS he had tidal volumes > . He is on 3L/min.  PT/OT is working with him.  His CRP is markedly elevated despite the IV steroids that he has received and even with methotrexate .  He does not appear to have pneumonia.  I have increased his inflammatory regimen by increasing steroids with decadron  8 BID and started cellcept  which we can continue on outpatient. He reports dizziness and orthopnea , I think this may be related to hypovolemia.  Lets give him a break from diuretic. I reveiwed medications with pharmacist.   PAST MEDICAL HISTORY   Past Medical History:  Diagnosis Date   Allergy    Arthritis    Coronary artery  disease    COVID-19 11/19/2019   Dysrhythmia    Hypertension    Peripheral vascular disease    Pulmonary fibrosis (HCC)    Sleep apnea    CPAP   Tremor      SURGICAL HISTORY   Past Surgical History:  Procedure Laterality Date   CARDIAC SURGERY     CARDIOVERSION N/A 03/03/2018   Procedure: CARDIOVERSION;  Surgeon: Hester Wolm PARAS, MD;  Location: [facility];  Service: Cardiovascular;  Laterality: N/A;   CARDIOVERSION N/A 05/24/2018   Procedure: CARDIOVERSION;  Surgeon: Hester Wolm PARAS, MD;  Location: [facility];  Service: Cardiovascular;  Laterality: N/A;   CATARACT EXTRACTION W/PHACO Right 06/03/2020   Procedure: CATARACT EXTRACTION PHACO AND INTRAOCULAR LENS PLACEMENT (IOC) RIGHT 6.07  00:48.0;  Surgeon: Myrna Adine Anes, MD;  Location: [facility];  Service: Ophthalmology;  Laterality: Right;  sleep apnea   CORONARY ARTERY BYPASS GRAFT     EYE SURGERY     LEG SURGERY Left    stents placed, Fem-Fem bypass   LOWER EXTREMITY ANGIOGRAPHY Left 09/01/2022   Procedure: Lower Extremity Angiography;  Surgeon: Jama Cordella MATSU, MD;  Location: [facility];  Service: Cardiovascular;  Laterality: Left;   RIGHT HEART CATH Right 07/24/2024   Procedure: RIGHT HEART CATH;  Surgeon: Florencio Cara BIRCH, MD;  Location: [facility];  Service: Cardiovascular;  Laterality: Right;   RIGHT/LEFT HEART CATH AND CORONARY ANGIOGRAPHY Bilateral 02/12/2022  Procedure: RIGHT/LEFT HEART CATH AND CORONARY ANGIOGRAPHY;  Surgeon: Hester Wolm PARAS, MD;  Location: [facility];  Service: Cardiovascular;  Laterality: Bilateral;   TONSILLECTOMY       FAMILY HISTORY   Family History  Problem Relation Age of Onset   Thyroid  disease Mother    Macular degeneration Mother    Cancer Father      SOCIAL HISTORY   Social History[1]   MEDICATIONS    Home Medication:    Current Medication: Current Medications[2]    ALLERGIES   Fish allergy, Spironolactone, Hydrocodone ,  Tramadol , Hydrochlorothiazide, Iodine , and Shellfish allergy     REVIEW OF SYSTEMS    Review of Systems:  Gen:  Denies  fever, sweats, chills weigh loss  HEENT: Denies blurred vision, double vision, ear pain, eye pain, hearing loss, nose bleeds, sore throat Cardiac:  No dizziness, chest pain or heaviness, chest tightness,edema Resp:   reports dyspnea chronically  Gi: Denies swallowing difficulty, stomach pain, nausea or vomiting, diarrhea, constipation, bowel incontinence Gu:  Denies bladder incontinence, burning urine Ext:   Denies Joint pain, stiffness or swelling Skin: Denies  skin rash, easy bruising or bleeding or hives Endoc:  Denies polyuria, polydipsia , polyphagia or weight change Psych:   Denies depression, insomnia or hallucinations   Other:  All other systems negative   VS: BP 121/82 (BP Location: Right Arm)   Pulse 98   Temp 97.9 F (36.6 C) (Oral)   Resp 20   Ht 5' 11 (1.803 m)   Wt 96.4 kg   SpO2 93%   BMI 29.64 kg/m      PHYSICAL EXAM    GENERAL:NAD, no fevers, chills, no weakness no fatigue HEAD: Normocephalic, atraumatic.  EYES: Pupils equal, round, reactive to light. Extraocular muscles intact. No scleral icterus.  MOUTH: Moist mucosal membrane. Dentition intact. No abscess noted.  EAR, NOSE, THROAT: Clear without exudates. No external lesions.  NECK: Supple. No thyromegaly. No nodules. No JVD.  PULMONARY: decreased breath sounds with mild rhonchi worse at bases bilaterally.  CARDIOVASCULAR: S1 and S2. Regular rate and rhythm. No murmurs, rubs, or gallops. No edema. Pedal pulses 2+ bilaterally.  GASTROINTESTINAL: Soft, nontender, nondistended. No masses. Positive bowel sounds. No hepatosplenomegaly.  MUSCULOSKELETAL: No swelling, clubbing, or edema. Range of motion full in all extremities.  NEUROLOGIC: Cranial nerves II through XII are intact. No gross focal neurological deficits. Sensation intact. Reflexes intact.  SKIN: No ulceration, lesions,  rashes, or cyanosis. Skin warm and dry. Turgor intact.  PSYCHIATRIC: Mood, affect within normal limits. The patient is awake, alert and oriented x 3. Insight, judgment intact.       IMAGING     ASSESSMENT/PLAN   Acute exacerbation of ILD      Patient with hypoxemia with pulmonary fibrosis     - continue methotrexate       - continue IS and Flutter valve     - continue steroids increase to decadron  8 bid       - bactrim  three times per week     - morphine  PRN     Atelectasis at bases bilaterally     - metaneb TID with RT   Acute on chronic systolic CHF exacerbation      Continue diuresis with torsemide     Strict Is&Os            Thank you for allowing me to participate in the care of this patient.   Patient/Family are satisfied with care plan and all questions  have been answered.    Provider disclosure: Patient with at least one acute or chronic illness or injury that poses a threat to life or bodily function and is being managed actively during this encounter.  All of the below services have been performed independently by signing provider:  review of prior documentation from internal and or external health records.  Review of previous and current lab results.  Interview and comprehensive assessment during patient visit today. Review of current and previous chest radiographs/CT scans. Discussion of management and test interpretation with health care team and patient/family.   This document was prepared using Dragon voice recognition software and may include unintentional dictation errors.     Quintrell Baze, M.D.  Division of Pulmonary & Critical Care Medicine              [1]  Social History Tobacco Use   Smoking status: Former    Current packs/day: 0.00    Types: Cigarettes    Quit date: 02/16/1979    Years since quitting: 45.8   Smokeless tobacco: Never  Vaping Use   Vaping status: Never Used  Substance Use Topics   Alcohol  use: Yes     Comment: rarely   Drug use: No  [2]  Current Facility-Administered Medications:    acetaminophen  (TYLENOL ) tablet 650 mg, 650 mg, Oral, Q6H PRN **OR** acetaminophen  (TYLENOL ) suppository 650 mg, 650 mg, Rectal, Q6H PRN, Cleatus Delayne GAILS, MD   albuterol  (PROVENTIL ) (2.5 MG/3ML) 0.083% nebulizer solution 2.5 mg, 2.5 mg, Nebulization, Q2H PRN, Cleatus Delayne V, MD, 2.5 mg at 12/16/24 0853   allopurinol  (ZYLOPRIM ) tablet 100 mg, 100 mg, Oral, Daily, Cleatus Delayne V, MD, 100 mg at 12/16/24 9163   apixaban  (ELIQUIS ) tablet 5 mg, 5 mg, Oral, BID, Cleatus Delayne V, MD, 5 mg at 12/16/24 9163   atorvastatin  (LIPITOR) tablet 40 mg, 40 mg, Oral, Daily, Cleatus Delayne V, MD, 40 mg at 12/15/24 1749   dexamethasone  (DECADRON ) injection 8 mg, 8 mg, Intravenous, Q12H, Lovie Zarling, MD, 8 mg at 12/16/24 9163   diltiazem  (CARDIZEM  CD) 24 hr capsule 240 mg, 240 mg, Oral, Daily, Awanda City, MD, 240 mg at 12/16/24 0836   DULoxetine  (CYMBALTA ) DR capsule 60 mg, 60 mg, Oral, Daily, Cleatus Delayne V, MD, 60 mg at 12/16/24 0836   feeding supplement (ENSURE PLUS HIGH PROTEIN) liquid 237 mL, 237 mL, Oral, BID BM, Awanda City, MD, 237 mL at 12/16/24 0841   folic acid  (FOLVITE ) tablet 1 mg, 1 mg, Oral, Daily, Awanda City, MD, 1 mg at 12/16/24 9163   guaiFENesin -dextromethorphan (ROBITUSSIN DM) 100-10 MG/5ML syrup 5 mL, 5 mL, Oral, Q4H PRN, Cleatus Delayne GAILS, MD   hydrALAZINE  (APRESOLINE ) injection 10 mg, 10 mg, Intravenous, Q6H PRN, Awanda City, MD, 10 mg at 12/08/24 1350   HYDROcodone -acetaminophen  (NORCO/VICODIN) 5-325 MG per tablet 1-2 tablet, 1-2 tablet, Oral, Q4H PRN, Cleatus Delayne GAILS, MD   insulin  aspart (novoLOG ) injection 0-15 Units, 0-15 Units, Subcutaneous, TID WC, Cleatus Delayne GAILS, MD, 3 Units at 12/16/24 9157   insulin  aspart (novoLOG ) injection 0-5 Units, 0-5 Units, Subcutaneous, QHS, Cleatus Delayne GAILS, MD, 2 Units at 12/07/24 2326   levothyroxine  (SYNTHROID ) tablet 25 mcg, 25 mcg, Oral, Q0600, Cleatus Delayne GAILS, MD, 25 mcg at  12/16/24 0622   methotrexate  (RHEUMATREX) tablet 10 mg, 10 mg, Oral, Weekly, Cleatus Delayne V, MD, 10 mg at 12/10/24 9166   morphine  (PF) 2 MG/ML injection 2 mg, 2 mg, Intravenous, Q2H PRN, Cleatus Delayne GAILS, MD   ondansetron  (  ZOFRAN ) tablet 4 mg, 4 mg, Oral, Q6H PRN, 4 mg at 12/14/24 0953 **OR** ondansetron  (ZOFRAN ) injection 4 mg, 4 mg, Intravenous, Q6H PRN, Cleatus Delayne GAILS, MD   sulfamethoxazole -trimethoprim  (BACTRIM ) 400-80 MG per tablet 1 tablet, 1 tablet, Oral, Once per day on Monday Wednesday Friday, Cleatus Delayne GAILS, MD, 1 tablet at 12/15/24 1003   torsemide  (DEMADEX ) tablet 20 mg, 20 mg, Oral, Daily, Franchot Novel, MD, 20 mg at 12/16/24 9163   Vitamin D  (Ergocalciferol ) (DRISDOL ) 1.25 MG (50000 UNIT) capsule 50,000 Units, 50,000 Units, Oral, Q7 days, Franchot Novel, MD, 50,000 Units at 12/14/24 1822  "

## 2024-12-17 DIAGNOSIS — J9621 Acute and chronic respiratory failure with hypoxia: Secondary | ICD-10-CM | POA: Diagnosis not present

## 2024-12-17 LAB — BASIC METABOLIC PANEL WITH GFR
Anion gap: 9 (ref 5–15)
BUN: 50 mg/dL — ABNORMAL HIGH (ref 8–23)
CO2: 30 mmol/L (ref 22–32)
Calcium: 8.7 mg/dL — ABNORMAL LOW (ref 8.9–10.3)
Chloride: 99 mmol/L (ref 98–111)
Creatinine, Ser: 1.17 mg/dL (ref 0.61–1.24)
GFR, Estimated: >60 mL/min
Glucose, Bld: 160 mg/dL — ABNORMAL HIGH (ref 70–99)
Potassium: 4.1 mmol/L (ref 3.5–5.1)
Sodium: 138 mmol/L (ref 135–145)

## 2024-12-17 LAB — CBC
HCT: 38.2 % — ABNORMAL LOW (ref 39.0–52.0)
Hemoglobin: 12.7 g/dL — ABNORMAL LOW (ref 13.0–17.0)
MCH: 34.2 pg — ABNORMAL HIGH (ref 26.0–34.0)
MCHC: 33.2 g/dL (ref 30.0–36.0)
MCV: 103 fL — ABNORMAL HIGH (ref 80.0–100.0)
Platelets: 119 K/uL — ABNORMAL LOW (ref 150–400)
RBC: 3.71 MIL/uL — ABNORMAL LOW (ref 4.22–5.81)
RDW: 16 % — ABNORMAL HIGH (ref 11.5–15.5)
WBC: 15.4 K/uL — ABNORMAL HIGH (ref 4.0–10.5)
nRBC: 0 % (ref 0.0–0.2)

## 2024-12-17 LAB — GLUCOSE, CAPILLARY
Glucose-Capillary: 158 mg/dL — ABNORMAL HIGH (ref 70–99)
Glucose-Capillary: 161 mg/dL — ABNORMAL HIGH (ref 70–99)
Glucose-Capillary: 192 mg/dL — ABNORMAL HIGH (ref 70–99)
Glucose-Capillary: 178 mg/dL — ABNORMAL HIGH (ref 70–99)

## 2024-12-17 MED ORDER — PANTOPRAZOLE SODIUM 40 MG PO TBEC
40.0000 mg | DELAYED_RELEASE_TABLET | Freq: Every day | ORAL | Status: DC
  Administered 2024-12-17: 40 mg via ORAL
  Filled 2024-12-17: qty 1

## 2024-12-17 MED ORDER — LACTULOSE 10 GM/15ML PO SOLN
20.0000 g | Freq: Two times a day (BID) | ORAL | Status: AC
  Administered 2024-12-17 – 2024-12-19 (×3): 20 g via ORAL
  Filled 2024-12-17 (×5): qty 30

## 2024-12-17 NOTE — Plan of Care (Signed)
" °  Problem: Clinical Measurements: Goal: Ability to maintain clinical measurements within normal limits will improve Outcome: Progressing   Problem: Clinical Measurements: Goal: Respiratory complications will improve Outcome: Progressing   Problem: Activity: Goal: Risk for activity intolerance will decrease Outcome: Progressing   Problem: Respiratory: Goal: Ability to maintain a clear airway will improve Outcome: Progressing   Problem: Respiratory: Goal: Levels of oxygenation will improve Outcome: Progressing   Problem: Respiratory: Goal: Ability to maintain adequate ventilation will improve Outcome: Progressing   Problem: Cardiac: Goal: Ability to achieve and maintain adequate cardiopulmonary perfusion will improve Outcome: Progressing   "

## 2024-12-17 NOTE — Plan of Care (Signed)
" °  Problem: Education: Goal: Ability to describe self-care measures that may prevent or decrease complications (Diabetes Survival Skills Education) will improve Outcome: Progressing   Problem: Coping: Goal: Ability to adjust to condition or change in health will improve Outcome: Progressing   Problem: Fluid Volume: Goal: Ability to maintain a balanced intake and output will improve Outcome: Progressing   Problem: Health Behavior/Discharge Planning: Goal: Ability to identify and utilize available resources and services will improve Outcome: Progressing Goal: Ability to manage health-related needs will improve Outcome: Progressing   Problem: Metabolic: Goal: Ability to maintain appropriate glucose levels will improve Outcome: Progressing   Problem: Nutritional: Goal: Maintenance of adequate nutrition will improve Outcome: Progressing Goal: Progress toward achieving an optimal weight will improve Outcome: Progressing   Problem: Skin Integrity: Goal: Risk for impaired skin integrity will decrease Outcome: Progressing   Problem: Tissue Perfusion: Goal: Adequacy of tissue perfusion will improve Outcome: Progressing   Problem: Education: Goal: Knowledge of General Education information will improve Description: Including pain rating scale, medication(s)/side effects and non-pharmacologic comfort measures Outcome: Progressing   Problem: Health Behavior/Discharge Planning: Goal: Ability to manage health-related needs will improve Outcome: Progressing   Problem: Clinical Measurements: Goal: Ability to maintain clinical measurements within normal limits will improve Outcome: Progressing Goal: Will remain free from infection Outcome: Progressing Goal: Diagnostic test results will improve Outcome: Progressing Goal: Respiratory complications will improve Outcome: Progressing Goal: Cardiovascular complication will be avoided Outcome: Progressing   Problem: Activity: Goal:  Risk for activity intolerance will decrease Outcome: Progressing   Problem: Nutrition: Goal: Adequate nutrition will be maintained Outcome: Progressing   Problem: Coping: Goal: Level of anxiety will decrease Outcome: Progressing   Problem: Elimination: Goal: Will not experience complications related to bowel motility Outcome: Progressing Goal: Will not experience complications related to urinary retention Outcome: Progressing   Problem: Pain Managment: Goal: General experience of comfort will improve and/or be controlled Outcome: Progressing   Problem: Safety: Goal: Ability to remain free from injury will improve Outcome: Progressing   Problem: Skin Integrity: Goal: Risk for impaired skin integrity will decrease Outcome: Progressing   Problem: Education: Goal: Ability to demonstrate management of disease process will improve Outcome: Progressing Goal: Ability to verbalize understanding of medication therapies will improve Outcome: Progressing Goal: Individualized Educational Video(s) Outcome: Progressing   Problem: Activity: Goal: Capacity to carry out activities will improve Outcome: Progressing   Problem: Cardiac: Goal: Ability to achieve and maintain adequate cardiopulmonary perfusion will improve Outcome: Progressing   Problem: Education: Goal: Knowledge of disease or condition will improve Outcome: Progressing Goal: Knowledge of the prescribed therapeutic regimen will improve Outcome: Progressing Goal: Individualized Educational Video(s) Outcome: Progressing   Problem: Activity: Goal: Ability to tolerate increased activity will improve Outcome: Progressing Goal: Will verbalize the importance of balancing activity with adequate rest periods Outcome: Progressing   Problem: Respiratory: Goal: Ability to maintain a clear airway will improve Outcome: Progressing Goal: Levels of oxygenation will improve Outcome: Progressing Goal: Ability to maintain  adequate ventilation will improve Outcome: Progressing   "
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
## 2024-12-17 NOTE — Progress Notes (Addendum)
 Physical Therapy Treatment Patient Details Name: [name]. MRN: [id number] DOB: 1941-07-24 Today's Date: 12/17/2024   History of Present Illness Pt is an 84 y.o. 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  MD assessment includes: acute on chronic respiratory failure with hypoxia, right seventh rib and T8 fracture on CT 12/05/2024, acute on chronic systolic CHF, and physical deconditioning.    PT Comments  Pt is able to walk 40' x 3 with RW and cga/min a x 1.  He does have several small imbalances that benefit from outside support to correct today.  He remains motivated to return home but does agree he is generally less stable today that during prior session. Sats 84-86% on 3 lpm with gait.    Pt is primary caregiver for wife who is now admitted herself.  He has had a general decline in balance over the past week with increased assist needed and at an increased risk for falls.  He is unable to physically assist her at this time.  If pt has +24 hour assist at home to assist with his mobility and with his wife, discharge home remains reasonable.  SNF would be beneficial for him if he does not have the support available to care for himself and his wife.  Will update recommendations at this time but unsure of what pt will decide.   If plan is discharge home, recommend the following: Assistance with cooking/housework;Help with stairs or ramp for entrance;Assist for transportation;A little help with walking and/or transfers;A little help with bathing/dressing/bathroom   Can travel by private vehicle         Equipment Recommendations  Rolling walker (2 wheels)    Recommendations for Other Services       Precautions / Restrictions Precautions Precautions: Fall Recall of Precautions/Restrictions: Impaired Restrictions Weight Bearing Restrictions Per Provider Order: No Other Position/Activity Restrictions: Watch SpO2 and RR     Mobility  Bed Mobility               General bed mobility comments: OOB pre/post tx Patient Response: Cooperative  Transfers Overall transfer level: Needs assistance Equipment used: Rolling walker (2 wheels) Transfers: Sit to/from Stand Sit to Stand: Contact guard assist                Ambulation/Gait Ambulation/Gait assistance: Min assist, Contact guard assist Gait Distance (Feet): 40 Feet Assistive device: Rolling walker (2 wheels)   Gait velocity: decreased     General Gait Details: 40' x 3  with RW and some general imbalances   Stairs             Wheelchair Mobility     Tilt Bed Tilt Bed Patient Response: Cooperative  Modified Rankin (Stroke Patients Only)       Balance Overall balance assessment: Needs assistance Sitting-balance support: Feet supported Sitting balance-Leahy Scale: Good     Standing balance support: Bilateral upper extremity supported Standing balance-Leahy Scale: Fair Standing balance comment: some general imbalances today with light min a to recover                            Communication Communication Communication: No  apparent difficulties  Cognition Arousal: Alert Behavior During Therapy: WFL for tasks assessed/performed   PT - Cognitive impairments: No apparent impairments                         Following commands: Intact      Cueing Cueing Techniques: Verbal cues  Exercises      General Comments        Pertinent Vitals/Pain Pain Assessment Pain Assessment: No/denies pain    Home Living                          Prior Function             PT Goals (current goals can now be found in the care plan section) Progress towards PT goals: Progressing toward goals    Frequency    Min 2X/week      PT Plan      Co-evaluation              AM-PAC PT 6 Clicks Mobility   Outcome Measure  Help needed turning from your back to your side while in a flat bed without using bedrails?: None Help needed moving from lying on your back to sitting on the side of a flat bed without using bedrails?: None Help needed moving to and from a bed to a chair (including a wheelchair)?: A Little Help needed standing up from a chair using your arms (e.g., wheelchair or bedside chair)?: A Little Help needed to walk in hospital room?: A Little Help needed climbing 3-5 steps with a railing? : A Little 6 Click Score: 20    End of Session Equipment Utilized During Treatment: Gait belt;Oxygen  Activity Tolerance: Patient tolerated treatment well Patient left: in chair;with call bell/phone within reach;with chair alarm set Nurse Communication: Mobility status PT Visit Diagnosis: Difficulty in walking, not elsewhere classified (R26.2);Muscle weakness (generalized) (M62.81)     Time: 8685-8669 PT Time Calculation (min) (ACUTE ONLY): 16 min  Charges:    $Gait Training: 8-22 mins PT General Charges $$ ACUTE PT VISIT: 1 Visit                   Lauraine Gills, PTA 12/17/2024, 1:55 PM

## 2024-12-17 NOTE — Progress Notes (Signed)
 Mobility Specialist - Progress Note   Pre-mobility: HR, BP, SpO2 During mobility: HR, BP, SpO2 Post-mobility: HR, BP, SPO2     12/17/24 1103  Mobility  Activity Ambulated with assistance;Stood at bedside  Level of Assistance Contact guard assist, steadying assist  Assistive Device Other (Comment) (HHA)  Distance Ambulated (ft) 5 ft  Range of Motion/Exercises Active  Activity Response Tolerated well  Mobility Referral Yes  Mobility visit 1 Mobility   Pt resting in bed on 3L upon entry. Pt ambulates to recliner from EOB CGA HHA for safety. Pt left in recliner with needs in reach. Chair alarm activated.   Guido Rumble Mobility Specialist 12/17/2024, 11:15 AM

## 2024-12-18 ENCOUNTER — Inpatient Hospital Stay

## 2024-12-18 DIAGNOSIS — J9621 Acute and chronic respiratory failure with hypoxia: Secondary | ICD-10-CM | POA: Diagnosis not present

## 2024-12-18 LAB — CBC WITH DIFFERENTIAL/PLATELET
Abs Immature Granulocytes: 0.15 K/uL — ABNORMAL HIGH (ref 0.00–0.07)
Basophils Absolute: 0 K/uL (ref 0.0–0.1)
Basophils Relative: 0 %
Eosinophils Absolute: 0 K/uL (ref 0.0–0.5)
Eosinophils Relative: 0 %
HCT: 37.8 % — ABNORMAL LOW (ref 39.0–52.0)
Hemoglobin: 12.6 g/dL — ABNORMAL LOW (ref 13.0–17.0)
Immature Granulocytes: 1 %
Lymphocytes Relative: 2 %
Lymphs Abs: 0.3 K/uL — ABNORMAL LOW (ref 0.7–4.0)
MCH: 34.5 pg — ABNORMAL HIGH (ref 26.0–34.0)
MCHC: 33.3 g/dL (ref 30.0–36.0)
MCV: 103.6 fL — ABNORMAL HIGH (ref 80.0–100.0)
Monocytes Absolute: 0.2 K/uL (ref 0.1–1.0)
Monocytes Relative: 1 %
Neutro Abs: 15.6 K/uL — ABNORMAL HIGH (ref 1.7–7.7)
Neutrophils Relative %: 96 %
Platelets: 126 K/uL — ABNORMAL LOW (ref 150–400)
RBC: 3.65 MIL/uL — ABNORMAL LOW (ref 4.22–5.81)
RDW: 15.8 % — ABNORMAL HIGH (ref 11.5–15.5)
WBC: 16.3 K/uL — ABNORMAL HIGH (ref 4.0–10.5)
nRBC: 0 % (ref 0.0–0.2)

## 2024-12-18 LAB — GLUCOSE, CAPILLARY
Glucose-Capillary: 143 mg/dL — ABNORMAL HIGH (ref 70–99)
Glucose-Capillary: 231 mg/dL — ABNORMAL HIGH (ref 70–99)
Glucose-Capillary: 140 mg/dL — ABNORMAL HIGH (ref 70–99)
Glucose-Capillary: 163 mg/dL — ABNORMAL HIGH (ref 70–99)

## 2024-12-18 LAB — ANCA TITERS
Atypical P-ANCA titer: <1:20 {titer}
C-ANCA: <1:20 {titer}
P-ANCA: <1:20 {titer}

## 2024-12-18 LAB — C-REACTIVE PROTEIN: CRP: 1.8 mg/dL — ABNORMAL HIGH

## 2024-12-18 MED ORDER — DEXAMETHASONE SOD PHOSPHATE PF 10 MG/ML IJ SOLN
6.0000 mg | Freq: Two times a day (BID) | INTRAMUSCULAR | Status: DC
  Administered 2024-12-18 (×2): 6 mg via INTRAVENOUS
  Filled 2024-12-18 (×2): qty 1

## 2024-12-18 MED ORDER — MYCOPHENOLATE MOFETIL 250 MG PO CAPS
1000.0000 mg | ORAL_CAPSULE | Freq: Two times a day (BID) | ORAL | Status: DC
  Administered 2024-12-18 – 2024-12-21 (×7): 1000 mg via ORAL
  Filled 2024-12-18 (×7): qty 4

## 2024-12-18 MED ORDER — PANTOPRAZOLE SODIUM 40 MG IV SOLR
40.0000 mg | INTRAVENOUS | Status: DC
  Administered 2024-12-18 – 2024-12-21 (×4): 40 mg via INTRAVENOUS
  Filled 2024-12-18 (×4): qty 10

## 2024-12-18 NOTE — NC FL2 (Signed)
 " [place]  MEDICAID FL2 LEVEL OF CARE FORM     IDENTIFICATION  Patient Name: [name]. Birthdate: [birth year] Sex: male Admission Date (Current Location): 12/07/2024  [facility] and [place] Number:  Chiropodist and Address:  [facility], [address], [place], [place] [address]      Provider Number: [id number]  Attending Physician Name and Address:  Franchot Novel, MD  Relative Name and Phone Number:  Danford Tat  Son  Emergency Contact  [phone]    Current Level of Care: Hospital Recommended Level of Care: Skilled Nursing Facility Prior Approval Number:    Date Approved/Denied:   PASRR Number:    Discharge Plan: SNF    Current Diagnoses: Patient Active Problem List   Diagnosis Date Noted   Physical deconditioning 12/08/2024   Closed wedge compression fracture of T8 vertebra (HCC) 12/08/2024   Acute on chronic respiratory failure (HCC) 12/08/2024   #Acute on chronic respiratory failure with hypoxia (HCC) 12/07/2024   Closed rib fracture, acute 12/07/2024   Non-traumatic compression fracture of T8 thoracic vertebra, initial encounter (HCC) 12/07/2024   Elevated troponin 12/07/2024   Acute on chronic systolic CHF (congestive heart failure) (HCC) 12/07/2024   Obesity (BMI 30-39.9) 10/16/2024   Chronic respiratory failure with hypoxia (HCC) 10/13/2024   Controlled type 2 diabetes mellitus with complication, without long-term current use of insulin  (HCC) 09/13/2023   Infrarenal abdominal aortic aneurysm (AAA) without rupture 12/30/2022   Pseudoaneurysm 08/14/2022   Groin hematoma 02/26/2022   ABLA (acute blood loss anemia) 02/15/2022   Right leg swelling    Angina pectoris 02/03/2022   Thoracic aortic ectasia 03/18/2020   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   Cellulitis of both lower extremities 11/07/2018   Lymphedema 08/17/2018   Carotid stenosis 08/14/2018   CAD s/p CABG (coronary artery disease) 08/12/2018    COPD (chronic obstructive pulmonary disease) (HCC) 08/12/2018   Gout 08/12/2018   Hyperglycemia 08/12/2018   Mixed hyperlipidemia 08/12/2018   Atherosclerosis of native arteries of extremity with intermittent claudication 08/12/2018   Bilateral leg edema-chronic venous stasis 07/12/2018   Acute dyspnea 07/12/2018   Left wrist pain 10/07/2017   Morbid obesity (HCC) 10/07/2017   Radial styloid tenosynovitis (de quervain) 10/07/2017   Senile purpura 09/20/2017   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/18/2016   Pulmonary fibrosis (HCC) 03/13/2016   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   Essential tremor 11/05/2015   Permanent atrial fibrillation (HCC) 09/30/2015   Benign essential hypertension 05/10/2015   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   Right knee pain 02/26/2015   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/12/2015   OSA (obstructive sleep apnea) 02/12/2015    Orientation RESPIRATION BLADDER Height & Weight     Self, Time, Situation, Place  O2 (2 liters, uses oxygen  at home) Continent Weight: 98.2 kg Height:  5' 11 (180.3 cm)  BEHAVIORAL SYMPTOMS/MOOD NEUROLOGICAL BOWEL NUTRITION STATUS      Continent Diet (heart healthy)  AMBULATORY STATUS COMMUNICATION OF NEEDS Skin   Limited Assist Verbally Normal                       Personal Care Assistance Level of Assistance  Bathing, Dressing Bathing Assistance: Limited assistance   Dressing Assistance: Limited assistance     Functional Limitations Info  Hearing   Hearing Info: Impaired      SPECIAL CARE FACTORS FREQUENCY   (oxygen  therapy)  Contractures Contractures Info: Not present    Additional Factors Info  Code Status, Allergies Code Status Info: Full Allergies Info: Fish Allergy High  Hives   Spironolactone Medium  Other (See Comments) gynecomastia  Hydrocodone  Not Specified   confusion  Tramadol  Not Specified  Other (See Comments) confusion  Hydrochlorothiazide Low  Other  (See Comments) worsens gout  Iodine  Low  Rash   Shellfish Allergy           Current Medications (12/18/2024):  This is the current hospital active medication list Current Facility-Administered Medications  Medication Dose Route Frequency Provider Last Rate Last Admin   acetaminophen  (TYLENOL ) tablet 650 mg  650 mg Oral Q6H PRN Duncan, Hazel V, MD       Or   acetaminophen  (TYLENOL ) suppository 650 mg  650 mg Rectal Q6H PRN Cleatus Delayne GAILS, MD       albuterol  (PROVENTIL ) (2.5 MG/3ML) 0.083% nebulizer solution 2.5 mg  2.5 mg Nebulization Q2H PRN Duncan, Hazel V, MD   2.5 mg at 12/18/24 9171   allopurinol  (ZYLOPRIM ) tablet 100 mg  100 mg Oral Daily Duncan, Hazel V, MD   100 mg at 12/18/24 9183   apixaban  (ELIQUIS ) tablet 5 mg  5 mg Oral BID Duncan, Hazel V, MD   5 mg at 12/18/24 9183   atorvastatin  (LIPITOR) tablet 40 mg  40 mg Oral Daily Duncan, Hazel V, MD   40 mg at 12/17/24 1718   dexamethasone  (DECADRON ) injection 6 mg  6 mg Intravenous Q12H Aleskerov, Fuad, MD   6 mg at 12/18/24 0817   diltiazem  (CARDIZEM  CD) 24 hr capsule 240 mg  240 mg Oral Daily Awanda City, MD   240 mg at 12/18/24 0816   DULoxetine  (CYMBALTA ) DR capsule 60 mg  60 mg Oral Daily Duncan, Hazel V, MD   60 mg at 12/18/24 0816   feeding supplement (ENSURE PLUS HIGH PROTEIN) liquid 237 mL  237 mL Oral BID BM Awanda City, MD   237 mL at 12/18/24 0816   folic acid  (FOLVITE ) tablet 1 mg  1 mg Oral Daily Awanda City, MD   1 mg at 12/18/24 0816   guaiFENesin -dextromethorphan (ROBITUSSIN DM) 100-10 MG/5ML syrup 5 mL  5 mL Oral Q4H PRN Duncan, Hazel V, MD       hydrALAZINE  (APRESOLINE ) injection 10 mg  10 mg Intravenous Q6H PRN Awanda City, MD   10 mg at 12/08/24 1350   HYDROcodone -acetaminophen  (NORCO/VICODIN) 5-325 MG per tablet 1-2 tablet  1-2 tablet Oral Q4H PRN Duncan, Hazel V, MD       insulin  aspart (novoLOG ) injection 0-15 Units  0-15 Units Subcutaneous TID WC Duncan, Hazel V, MD   5 Units at 12/18/24 1213   insulin  aspart (novoLOG )  injection 0-5 Units  0-5 Units Subcutaneous QHS Duncan, Hazel V, MD   2 Units at 12/07/24 2326   lactulose  (CHRONULAC ) 10 GM/15ML solution 20 g  20 g Oral BID Franchot Novel, MD   20 g at 12/18/24 0816   levothyroxine  (SYNTHROID ) tablet 25 mcg  25 mcg Oral Q0600 Cleatus Delayne GAILS, MD   25 mcg at 12/18/24 9374   methotrexate  (RHEUMATREX) tablet 10 mg  10 mg Oral Weekly Duncan, Hazel V, MD   10 mg at 12/17/24 0825   morphine  (PF) 2 MG/ML injection 2 mg  2 mg Intravenous Q2H PRN Cleatus Delayne GAILS, MD       mycophenolate  (CELLCEPT ) capsule 1,000 mg  1,000 mg Oral BID Aleskerov, Fuad, MD   1,000  mg at 12/18/24 0949   ondansetron  (ZOFRAN ) tablet 4 mg  4 mg Oral Q6H PRN Duncan, Hazel V, MD   4 mg at 12/14/24 9046   Or   ondansetron  (ZOFRAN ) injection 4 mg  4 mg Intravenous Q6H PRN Cleatus Delayne GAILS, MD       pantoprazole  (PROTONIX ) injection 40 mg  40 mg Intravenous Q24H Aleskerov, Fuad, MD   40 mg at 12/18/24 9183   sulfamethoxazole -trimethoprim  (BACTRIM ) 400-80 MG per tablet 1 tablet  1 tablet Oral Once per day on Monday Wednesday Friday Cleatus Delayne GAILS, MD   1 tablet at 12/18/24 9183   Vitamin D  (Ergocalciferol ) (DRISDOL ) 1.25 MG (50000 UNIT) capsule 50,000 Units  50,000 Units Oral Q7 days Franchot Novel, MD   50,000 Units at 12/14/24 8177     Discharge Medications: Please see discharge summary for a list of discharge medications.  Relevant Imaging Results:  Relevant Lab Results:   Additional Information [id number]  Grayce JAYSON Perfect, RN     "

## 2024-12-18 NOTE — Progress Notes (Signed)
 Physical Therapy Treatment Patient Details Name: [name]. MRN: [id number] DOB: 02-Dec-1941 Today's Date: 12/18/2024   History of Present Illness Pt is an 84 y.o. 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  MD assessment includes: acute on chronic respiratory failure with hypoxia, right seventh rib and T8 fracture on CT 12/05/2024, acute on chronic systolic CHF, and physical deconditioning.    PT Comments  Pt is able to walk 40' x 3 with RW and cga/min a x 1.  Continues with imbalances with light assist to recover.  Is in agreement he is not at his baseline with increased risk for falls.  On 3rd attempt he is noted to be more fatigued with increased assist and time to stand from recliner.  Opts to return to bed to rest.  Remains motivated to participate.   If plan is discharge home, recommend the following: Assistance with cooking/housework;Help with stairs or ramp for entrance;Assist for transportation;A little help with walking and/or transfers;A little help with bathing/dressing/bathroom   Can travel by private vehicle        Equipment Recommendations  Rolling walker (2 wheels)    Recommendations for Other Services       Precautions / Restrictions Precautions Precautions: Fall Recall of Precautions/Restrictions: Impaired Restrictions Weight Bearing Restrictions Per Provider Order: No Other Position/Activity Restrictions: Watch SpO2 and RR     Mobility  Bed Mobility Overal bed mobility: Needs Assistance Bed Mobility: Supine to Sit, Sit to Supine Rolling: Supervision Sidelying to sit:  Supervision         Patient Response: Cooperative  Transfers Overall transfer level: Needs assistance Equipment used: Rolling walker (2 wheels) Transfers: Sit to/from Stand Sit to Stand: Contact guard assist, Min assist           General transfer comment: struggles as he fatigues needing increased rocking and time.    Ambulation/Gait Ambulation/Gait assistance: Min assist, Contact guard assist Gait Distance (Feet): 40 Feet Assistive device: Rolling walker (2 wheels) Gait Pattern/deviations: Step-through pattern, Decreased step length - right, Decreased step length - left, Trunk flexed Gait velocity: decreased     General Gait Details: 64' x 3  with RW and some general imbalances   Stairs             Wheelchair Mobility     Tilt Bed Tilt Bed Patient Response: Cooperative  Modified Rankin (Stroke Patients Only)       Balance Overall balance assessment: Needs assistance Sitting-balance support: Feet supported Sitting balance-Leahy Scale: Good     Standing balance support: Bilateral upper extremity supported Standing balance-Leahy Scale: Fair Standing balance comment: some general imbalances today with light min a to recover                            Communication Communication Communication: No apparent difficulties  Cognition Arousal: Alert Behavior During Therapy: WFL for tasks assessed/performed   PT - Cognitive impairments: No apparent impairments                         Following commands: Intact      Cueing Cueing Techniques: Verbal cues  Exercises      General Comments        Pertinent Vitals/Pain Pain Assessment Pain Assessment: No/denies pain    Home Living                          Prior Function            PT Goals (current goals can now be found in the care plan section) Progress towards PT goals: Progressing toward goals    Frequency    Min 2X/week      PT Plan       Co-evaluation              AM-PAC PT 6 Clicks Mobility   Outcome Measure  Help needed turning from your back to your side while in a flat bed without using bedrails?: None Help needed moving from lying on your back to sitting on the side of a flat bed without using bedrails?: None Help needed moving to and from a bed to a chair (including a wheelchair)?: A Little Help needed standing up from a chair using your arms (e.g., wheelchair or bedside chair)?: A Little Help needed to walk in hospital room?: A Little Help needed climbing 3-5 steps with a railing? : A Little 6 Click Score: 20    End of Session Equipment Utilized During Treatment: Gait belt;Oxygen  Activity Tolerance: Patient tolerated treatment well Patient left: in chair;with call bell/phone within reach;with chair alarm set Nurse Communication: Mobility status PT Visit Diagnosis: Difficulty in walking, not elsewhere classified (R26.2);Muscle weakness (generalized) (M62.81)     Time: 8474-8458 PT Time Calculation (min) (ACUTE ONLY): 16 min  Charges:    $Gait Training: 8-22 mins PT General Charges $$ ACUTE PT VISIT: 1 Visit                   Lauraine Gills, PTA 12/18/2024, 4:16 PM

## 2024-12-18 NOTE — Plan of Care (Signed)
" °  Problem: Education: Goal: Ability to describe self-care measures that may prevent or decrease complications (Diabetes Survival Skills Education) will improve Outcome: Progressing   Problem: Coping: Goal: Ability to adjust to condition or change in health will improve Outcome: Progressing   Problem: Fluid Volume: Goal: Ability to maintain a balanced intake and output will improve Outcome: Progressing   Problem: Health Behavior/Discharge Planning: Goal: Ability to identify and utilize available resources and services will improve Outcome: Progressing Goal: Ability to manage health-related needs will improve Outcome: Progressing   Problem: Metabolic: Goal: Ability to maintain appropriate glucose levels will improve Outcome: Progressing   Problem: Nutritional: Goal: Maintenance of adequate nutrition will improve Outcome: Progressing Goal: Progress toward achieving an optimal weight will improve Outcome: Progressing   Problem: Skin Integrity: Goal: Risk for impaired skin integrity will decrease Outcome: Progressing   Problem: Tissue Perfusion: Goal: Adequacy of tissue perfusion will improve Outcome: Progressing   Problem: Education: Goal: Knowledge of General Education information will improve Description: Including pain rating scale, medication(s)/side effects and non-pharmacologic comfort measures Outcome: Progressing   Problem: Health Behavior/Discharge Planning: Goal: Ability to manage health-related needs will improve Outcome: Progressing   Problem: Clinical Measurements: Goal: Ability to maintain clinical measurements within normal limits will improve Outcome: Progressing Goal: Will remain free from infection Outcome: Progressing Goal: Diagnostic test results will improve Outcome: Progressing Goal: Respiratory complications will improve Outcome: Progressing Goal: Cardiovascular complication will be avoided Outcome: Progressing   Problem: Activity: Goal:  Risk for activity intolerance will decrease Outcome: Progressing   Problem: Nutrition: Goal: Adequate nutrition will be maintained Outcome: Progressing   Problem: Coping: Goal: Level of anxiety will decrease Outcome: Progressing   Problem: Elimination: Goal: Will not experience complications related to bowel motility Outcome: Progressing Goal: Will not experience complications related to urinary retention Outcome: Progressing   Problem: Pain Managment: Goal: General experience of comfort will improve and/or be controlled Outcome: Progressing   Problem: Safety: Goal: Ability to remain free from injury will improve Outcome: Progressing   Problem: Skin Integrity: Goal: Risk for impaired skin integrity will decrease Outcome: Progressing   Problem: Education: Goal: Ability to demonstrate management of disease process will improve Outcome: Progressing Goal: Ability to verbalize understanding of medication therapies will improve Outcome: Progressing Goal: Individualized Educational Video(s) Outcome: Progressing   Problem: Activity: Goal: Capacity to carry out activities will improve Outcome: Progressing   Problem: Cardiac: Goal: Ability to achieve and maintain adequate cardiopulmonary perfusion will improve Outcome: Progressing   Problem: Education: Goal: Knowledge of disease or condition will improve Outcome: Progressing Goal: Knowledge of the prescribed therapeutic regimen will improve Outcome: Progressing Goal: Individualized Educational Video(s) Outcome: Progressing   Problem: Activity: Goal: Ability to tolerate increased activity will improve Outcome: Progressing Goal: Will verbalize the importance of balancing activity with adequate rest periods Outcome: Progressing   Problem: Respiratory: Goal: Ability to maintain a clear airway will improve Outcome: Progressing Goal: Levels of oxygenation will improve Outcome: Progressing Goal: Ability to maintain  adequate ventilation will improve Outcome: Progressing   "
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
## 2024-12-18 NOTE — Progress Notes (Signed)
 Occupational Therapy Treatment Patient Details Name: [name]. MRN: [id number] DOB: October 04, 1941 Today's Date: 12/18/2024   History of present illness Pt is an 84 y.o. 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  MD assessment includes: acute on chronic respiratory failure with hypoxia, right seventh rib and T8 fracture on CT 12/05/2024, acute on chronic systolic CHF, and physical deconditioning.   OT comments  Pt in recliner, starting to eat lunch. Pt declines out of recliner but agreeable to session. Pt edu in IS and flutter valve after pt unable to return demo proper technique. After initial instruction, pt able to return demo well. Pt edu in activity pacing and use of PLB. Pt verbalized understanding and continues to benefit.       If plan is discharge home, recommend the following:  A little help with walking and/or transfers;A little help with bathing/dressing/bathroom;Supervision due to cognitive status   Equipment Recommendations  None recommended by OT    Recommendations for Other Services      Precautions / Restrictions Precautions Precautions: Fall Recall of Precautions/Restrictions: Impaired Restrictions Weight Bearing Restrictions Per Provider Order: No Other Position/Activity Restrictions: Watch SpO2 and RR       Mobility Bed Mobility               General bed mobility comments: NT, in recliner pre and post session    Transfers     General transfer comment: declined, recently up, starting to eat             Cognition Arousal: Alert Behavior During  Therapy: [facility] for tasks assessed/performed Cognition: No apparent impairments            Cueing      Exercises Other Exercises Other Exercises: Pt edu in IS and flutter valve after pt unable to return demo proper technique. After initial instruction, pt able to return demo well.            Pertinent Vitals/ Pain       Pain Assessment Pain Assessment: No/denies pain   Frequency  Min 2X/week        Progress Toward Goals  OT Goals(current goals can now be found in the care plan section)  Progress towards OT goals: Progressing toward goals  Acute Rehab OT Goals Patient Stated Goal: go home OT Goal Formulation: With patient Time For Goal Achievement: 12/22/24 Potential to Achieve Goals: Good  Plan      Co-evaluation                 AM-PAC OT 6 Clicks Daily Activity     Outcome Measure   Help from another person eating meals?: None Help from another person taking care of personal grooming?: A Little Help from another person toileting, which includes using toliet, bedpan, or urinal?: A Little Help from another person bathing (including washing, rinsing, drying)?: A Little Help from another person to put on and taking off regular upper body clothing?: None Help from another person to put on and taking off regular lower body clothing?: A Little 6 Click Score: 20    End of Session    OT Visit Diagnosis: Other abnormalities of gait and mobility (R26.89);Muscle weakness (  generalized) (M62.81)   Activity Tolerance Patient tolerated treatment well   Patient Left in chair;with call bell/phone within reach;with chair alarm set   Nurse Communication          Time: 8844-8796 OT Time Calculation (min): 8 min  Charges: OT General Charges $OT Visit: 1 Visit OT Treatments $Therapeutic Activity: 8-22 mins  Warren SAUNDERS., MPH, MS, OTR/L ascom [phone] 12/18/2024, 1:02 PM

## 2024-12-18 NOTE — Progress Notes (Signed)
 "    PULMONOLOGY         Date: 12/18/2024,   MRN# [id number] [name]. 1941/01/30     AdmissionWeight: 102.1 kg                 CurrentWeight: 98.2 kg  Referring provider: Dr Franchot   CHIEF COMPLAINT:   Acute on chronic hypoxemic respiratory failure   HISTORY OF PRESENT ILLNESS   This 84 year old very pleasant male with known interstitial lung disease and CKD as well as background history of COVID-19, sleep apnea, pulmonary fibrosis, essential hypertension, CHF, who came in with acute on chronic hypoxemic respiratory failure with what appears to be an underlying ILD with acute exacerbation.  Patient has been treated with antibiotics as well as steroids and has partially improved with weaning down of oxygen  from 6  to 3 L nasal cannula however continues to desaturate with any activity including ambulation to mid 80s.  His last x-ray was performed January 1 and it shows chronic interstitial lung disease without any changes and no evidence of acute pneumonia.  PCCM consultation placed for additional evaluation and management of complex comorbid pulmonary patient with acute exacerbation of underlying interstitial lung disease  12/16/24- patient has had 2 recruitment treatments with Metaneb device.  He is able to take deeper breaths now, this morning using IS he had tidal volumes > . He is on 3L/min.  PT/OT is working with him.  His CRP is markedly elevated despite the IV steroids that he has received and even with methotrexate .  He does not appear to have pneumonia.  I have increased his inflammatory regimen by increasing steroids with decadron  8 BID and started cellcept  which we can continue on outpatient. He reports dizziness and orthopnea , I think this may be related to hypovolemia.  Lets give him a break from diuretic. I reveiwed medications with pharmacist.   12/18/24- patient seen at bedside.  He feels improved.  He is on 2L/min [place].  Today we will transition IV  cellcept  to PO and reduce decadron  to 6iv bid. He may initiate dc planning phase of care from pulmonary perspective  PAST MEDICAL HISTORY   Past Medical History:  Diagnosis Date   Allergy    Arthritis    Coronary artery disease    COVID-19 11/19/2019   Dysrhythmia    Hypertension    Peripheral vascular disease    Pulmonary fibrosis (HCC)    Sleep apnea    CPAP   Tremor      SURGICAL HISTORY   Past Surgical History:  Procedure Laterality Date   CARDIAC SURGERY     CARDIOVERSION N/A 03/03/2018   Procedure: CARDIOVERSION;  Surgeon: Hester Wolm PARAS, MD;  Location: [facility];  Service: Cardiovascular;  Laterality: N/A;   CARDIOVERSION N/A 05/24/2018   Procedure: CARDIOVERSION;  Surgeon: Hester Wolm PARAS, MD;  Location: [facility];  Service: Cardiovascular;  Laterality: N/A;   CATARACT EXTRACTION W/PHACO Right 06/03/2020   Procedure: CATARACT EXTRACTION PHACO AND INTRAOCULAR LENS PLACEMENT (IOC) RIGHT 6.07  00:48.0;  Surgeon: Myrna Adine Anes, MD;  Location: [facility];  Service: Ophthalmology;  Laterality: Right;  sleep apnea   CORONARY ARTERY BYPASS GRAFT     EYE SURGERY     LEG SURGERY Left    stents placed, Fem-Fem bypass   LOWER EXTREMITY ANGIOGRAPHY Left 09/01/2022   Procedure: Lower Extremity Angiography;  Surgeon: Jama Cordella MATSU, MD;  Location: [facility];  Service: Cardiovascular;  Laterality: Left;  RIGHT HEART CATH Right 07/24/2024   Procedure: RIGHT HEART CATH;  Surgeon: Florencio Cara BIRCH, MD;  Location: [facility];  Service: Cardiovascular;  Laterality: Right;   RIGHT/LEFT HEART CATH AND CORONARY ANGIOGRAPHY Bilateral 02/12/2022   Procedure: RIGHT/LEFT HEART CATH AND CORONARY ANGIOGRAPHY;  Surgeon: Hester Wolm PARAS, MD;  Location: [facility];  Service: Cardiovascular;  Laterality: Bilateral;   TONSILLECTOMY       FAMILY HISTORY   Family History  Problem Relation Age of Onset   Thyroid  disease Mother    Macular  degeneration Mother    Cancer Father      SOCIAL HISTORY   Social History[1]   MEDICATIONS    Home Medication:    Current Medication: Current Medications[2]    ALLERGIES   Fish allergy, Spironolactone, Hydrocodone , Tramadol , Hydrochlorothiazide, Iodine , and Shellfish allergy     REVIEW OF SYSTEMS    Review of Systems:  Gen:  Denies  fever, sweats, chills weigh loss  HEENT: Denies blurred vision, double vision, ear pain, eye pain, hearing loss, nose bleeds, sore throat Cardiac:  No dizziness, chest pain or heaviness, chest tightness,edema Resp:   reports dyspnea chronically  Gi: Denies swallowing difficulty, stomach pain, nausea or vomiting, diarrhea, constipation, bowel incontinence Gu:  Denies bladder incontinence, burning urine Ext:   Denies Joint pain, stiffness or swelling Skin: Denies  skin rash, easy bruising or bleeding or hives Endoc:  Denies polyuria, polydipsia , polyphagia or weight change Psych:   Denies depression, insomnia or hallucinations   Other:  All other systems negative   VS: BP (!) 152/91 (BP Location: Right Arm)   Pulse 79   Temp 97.8 F (36.6 C)   Resp 18   Ht 5' 11 (1.803 m)   Wt 98.2 kg   SpO2 95%   BMI 30.19 kg/m      PHYSICAL EXAM    GENERAL:NAD, no fevers, chills, no weakness no fatigue HEAD: Normocephalic, atraumatic.  EYES: Pupils equal, round, reactive to light. Extraocular muscles intact. No scleral icterus.  MOUTH: Moist mucosal membrane. Dentition intact. No abscess noted.  EAR, NOSE, THROAT: Clear without exudates. No external lesions.  NECK: Supple. No thyromegaly. No nodules. No JVD.  PULMONARY: decreased breath sounds with mild rhonchi worse at bases bilaterally.  CARDIOVASCULAR: S1 and S2. Regular rate and rhythm. No murmurs, rubs, or gallops. No edema. Pedal pulses 2+ bilaterally.  GASTROINTESTINAL: Soft, nontender, nondistended. No masses. Positive bowel sounds. No hepatosplenomegaly.  MUSCULOSKELETAL:  No swelling, clubbing, or edema. Range of motion full in all extremities.  NEUROLOGIC: Cranial nerves II through XII are intact. No gross focal neurological deficits. Sensation intact. Reflexes intact.  SKIN: No ulceration, lesions, rashes, or cyanosis. Skin warm and dry. Turgor intact.  PSYCHIATRIC: Mood, affect within normal limits. The patient is awake, alert and oriented x 3. Insight, judgment intact.       IMAGING     ASSESSMENT/PLAN   Acute exacerbation of ILD      Patient with hypoxemia with pulmonary fibrosis     - continue methotrexate       - continue IS and Flutter valve     - continue steroids increase to decadron  8 bid       - bactrim  three times per week     - morphine  PRN     Atelectasis at bases bilaterally     - metaneb TID with RT   Acute on chronic systolic CHF exacerbation      Continue diuresis with  torsemide     Strict Is&Os            Thank you for allowing me to participate in the care of this patient.   Patient/Family are satisfied with care plan and all questions have been answered.    Provider disclosure: Patient with at least one acute or chronic illness or injury that poses a threat to life or bodily function and is being managed actively during this encounter.  All of the below services have been performed independently by signing provider:  review of prior documentation from internal and or external health records.  Review of previous and current lab results.  Interview and comprehensive assessment during patient visit today. Review of current and previous chest radiographs/CT scans. Discussion of management and test interpretation with health care team and patient/family.   This document was prepared using Dragon voice recognition software and may include unintentional dictation errors.     Keslyn Teater, M.D.  Division of Pulmonary & Critical Care Medicine               [1]  Social History Tobacco Use   Smoking  status: Former    Current packs/day: 0.00    Types: Cigarettes    Quit date: 02/16/1979    Years since quitting: 45.8   Smokeless tobacco: Never  Vaping Use   Vaping status: Never Used  Substance Use Topics   Alcohol  use: Yes    Comment: rarely   Drug use: No  [2]  Current Facility-Administered Medications:    acetaminophen  (TYLENOL ) tablet 650 mg, 650 mg, Oral, Q6H PRN **OR** acetaminophen  (TYLENOL ) suppository 650 mg, 650 mg, Rectal, Q6H PRN, Cleatus Delayne GAILS, MD   albuterol  (PROVENTIL ) (2.5 MG/3ML) 0.083% nebulizer solution 2.5 mg, 2.5 mg, Nebulization, Q2H PRN, Cleatus Delayne V, MD, 2.5 mg at 12/17/24 1404   allopurinol  (ZYLOPRIM ) tablet 100 mg, 100 mg, Oral, Daily, Cleatus Delayne V, MD, 100 mg at 12/17/24 9175   apixaban  (ELIQUIS ) tablet 5 mg, 5 mg, Oral, BID, Cleatus Delayne V, MD, 5 mg at 12/17/24 2202   atorvastatin  (LIPITOR) tablet 40 mg, 40 mg, Oral, Daily, Cleatus Delayne V, MD, 40 mg at 12/17/24 1718   dexamethasone  (DECADRON ) injection 8 mg, 8 mg, Intravenous, Q12H, Yann Biehn, MD, 8 mg at 12/17/24 2202   diltiazem  (CARDIZEM  CD) 24 hr capsule 240 mg, 240 mg, Oral, Daily, Awanda [place], MD, 240 mg at 12/17/24 9175   DULoxetine  (CYMBALTA ) DR capsule 60 mg, 60 mg, Oral, Daily, Cleatus Delayne V, MD, 60 mg at 12/17/24 0824   feeding supplement (ENSURE PLUS HIGH PROTEIN) liquid 237 mL, 237 mL, Oral, BID BM, Awanda City, MD, 237 mL at 12/17/24 1431   folic acid  (FOLVITE ) tablet 1 mg, 1 mg, Oral, Daily, Awanda [place], MD, 1 mg at 12/17/24 9175   guaiFENesin -dextromethorphan (ROBITUSSIN DM) 100-10 MG/5ML syrup 5 mL, 5 mL, Oral, Q4H PRN, Cleatus Delayne GAILS, MD   hydrALAZINE  (APRESOLINE ) injection 10 mg, 10 mg, Intravenous, Q6H PRN, Awanda [place], MD, 10 mg at 12/08/24 1350   HYDROcodone -acetaminophen  (NORCO/VICODIN) 5-325 MG per tablet 1-2 tablet, 1-2 tablet, Oral, Q4H PRN, Cleatus Delayne GAILS, MD   insulin  aspart (novoLOG ) injection 0-15 Units, 0-15 Units, Subcutaneous, TID WC, Cleatus Delayne GAILS, MD, 3 Units at  12/17/24 1717   insulin  aspart (novoLOG ) injection 0-5 Units, 0-5 Units, Subcutaneous, QHS, Cleatus Delayne GAILS, MD, 2 Units at 12/07/24 2326   lactulose  (CHRONULAC ) 10 GM/15ML solution 20 g, 20 g, Oral, BID, Franchot Novel, MD, 20 g at  12/17/24 1046   levothyroxine  (SYNTHROID ) tablet 25 mcg, 25 mcg, Oral, Q0600, Cleatus Delayne GAILS, MD, 25 mcg at 12/18/24 9374   methotrexate  (RHEUMATREX) tablet 10 mg, 10 mg, Oral, Weekly, Duncan, Hazel V, MD, 10 mg at 12/17/24 0825   morphine  (PF) 2 MG/ML injection 2 mg, 2 mg, Intravenous, Q2H PRN, Cleatus Delayne GAILS, MD   mycophenolate  (CELLCEPT ) 1,000 mg in dextrose  5 % 170 mL IVPB, 1,000 mg, Intravenous, Q12H, Madisin Hasan, MD, Stopped at 12/18/24 0305   ondansetron  (ZOFRAN ) tablet 4 mg, 4 mg, Oral, Q6H PRN, 4 mg at 12/14/24 0953 **OR** ondansetron  (ZOFRAN ) injection 4 mg, 4 mg, Intravenous, Q6H PRN, Cleatus Delayne GAILS, MD   pantoprazole  (PROTONIX ) EC tablet 40 mg, 40 mg, Oral, Daily, Franchot Novel, MD, 40 mg at 12/17/24 1550   sulfamethoxazole -trimethoprim  (BACTRIM ) 400-80 MG per tablet 1 tablet, 1 tablet, Oral, Once per day on Monday Wednesday Friday, Cleatus Delayne GAILS, MD, 1 tablet at 12/15/24 1003   Vitamin D  (Ergocalciferol ) (DRISDOL ) 1.25 MG (50000 UNIT) capsule 50,000 Units, 50,000 Units, Oral, Q7 days, Franchot Novel, MD, 50,000 Units at 12/14/24 1822  "

## 2024-12-18 NOTE — TOC Progression Note (Signed)
 Transition of Care (TOC) - Progression Note    Patient Details  Name: [name]. MRN: [id number] Date of Birth: 08-Dec-1940  Transition of [facility]) CM/SW Contact  Dalia GORMAN Fuse, RN Phone Number: 12/18/2024, 4:17 PM  Clinical Narrative:     DONALD [id number] A                     Expected Discharge Plan and Services                                               Social Drivers of Health (SDOH) Interventions SDOH Screenings   Food Insecurity: No Food Insecurity (12/08/2024)  Housing: Low Risk (12/08/2024)  Transportation Needs: No Transportation Needs (12/08/2024)  Utilities: Not At Risk (12/08/2024)  Financial Resource Strain: Low Risk  (09/14/2023)   Received from [facility]  Social Connections: Moderately Integrated (12/08/2024)  Tobacco Use: Medium Risk (12/07/2024)    Readmission Risk Interventions     No data to display

## 2024-12-19 DIAGNOSIS — J9621 Acute and chronic respiratory failure with hypoxia: Secondary | ICD-10-CM

## 2024-12-19 LAB — CBC WITH DIFFERENTIAL/PLATELET
Abs Immature Granulocytes: 0.14 K/uL — ABNORMAL HIGH (ref 0.00–0.07)
Basophils Absolute: 0 K/uL (ref 0.0–0.1)
Basophils Relative: 0 %
Eosinophils Absolute: 0 K/uL (ref 0.0–0.5)
Eosinophils Relative: 0 %
HCT: 38 % — ABNORMAL LOW (ref 39.0–52.0)
Hemoglobin: 12.4 g/dL — ABNORMAL LOW (ref 13.0–17.0)
Immature Granulocytes: 1 %
Lymphocytes Relative: 2 %
Lymphs Abs: 0.3 K/uL — ABNORMAL LOW (ref 0.7–4.0)
MCH: 33.8 pg (ref 26.0–34.0)
MCHC: 32.6 g/dL (ref 30.0–36.0)
MCV: 103.5 fL — ABNORMAL HIGH (ref 80.0–100.0)
Monocytes Absolute: 0.3 K/uL (ref 0.1–1.0)
Monocytes Relative: 2 %
Neutro Abs: 13.1 K/uL — ABNORMAL HIGH (ref 1.7–7.7)
Neutrophils Relative %: 95 %
Platelets: 133 K/uL — ABNORMAL LOW (ref 150–400)
RBC: 3.67 MIL/uL — ABNORMAL LOW (ref 4.22–5.81)
RDW: 15.7 % — ABNORMAL HIGH (ref 11.5–15.5)
WBC: 13.9 K/uL — ABNORMAL HIGH (ref 4.0–10.5)
nRBC: 0 % (ref 0.0–0.2)

## 2024-12-19 LAB — BASIC METABOLIC PANEL WITH GFR
Anion gap: 10 (ref 5–15)
BUN: 51 mg/dL — ABNORMAL HIGH (ref 8–23)
CO2: 30 mmol/L (ref 22–32)
Calcium: 8.9 mg/dL (ref 8.9–10.3)
Chloride: 102 mmol/L (ref 98–111)
Creatinine, Ser: 1.16 mg/dL (ref 0.61–1.24)
GFR, Estimated: >60 mL/min
Glucose, Bld: 154 mg/dL — ABNORMAL HIGH (ref 70–99)
Potassium: 6.1 mmol/L — ABNORMAL HIGH (ref 3.5–5.1)
Sodium: 141 mmol/L (ref 135–145)

## 2024-12-19 LAB — POTASSIUM: Potassium: 5.3 mmol/L — ABNORMAL HIGH (ref 3.5–5.1)

## 2024-12-19 LAB — GLUCOSE, CAPILLARY
Glucose-Capillary: 128 mg/dL — ABNORMAL HIGH (ref 70–99)
Glucose-Capillary: 211 mg/dL — ABNORMAL HIGH (ref 70–99)
Glucose-Capillary: 193 mg/dL — ABNORMAL HIGH (ref 70–99)
Glucose-Capillary: 166 mg/dL — ABNORMAL HIGH (ref 70–99)

## 2024-12-19 MED ORDER — TORSEMIDE 20 MG PO TABS
10.0000 mg | ORAL_TABLET | Freq: Every day | ORAL | Status: DC
  Administered 2024-12-20 – 2024-12-21 (×2): 10 mg via ORAL
  Filled 2024-12-19 (×2): qty 1

## 2024-12-19 MED ORDER — DEXAMETHASONE SODIUM PHOSPHATE 4 MG/ML IJ SOLN
4.0000 mg | Freq: Two times a day (BID) | INTRAMUSCULAR | Status: DC
  Administered 2024-12-19 – 2024-12-21 (×5): 4 mg via INTRAVENOUS
  Filled 2024-12-19 (×5): qty 1

## 2024-12-19 MED ORDER — SODIUM ZIRCONIUM CYCLOSILICATE 5 G PO PACK
5.0000 g | PACK | Freq: Once | ORAL | Status: AC
  Administered 2024-12-19: 5 g via ORAL
  Filled 2024-12-19: qty 1

## 2024-12-19 NOTE — Plan of Care (Signed)
" °  Problem: Clinical Measurements: Goal: Ability to maintain clinical measurements within normal limits will improve Outcome: Progressing   Problem: Clinical Measurements: Goal: Respiratory complications will improve Outcome: Progressing   Problem: Clinical Measurements: Goal: Cardiovascular complication will be avoided Outcome: Progressing   Problem: Activity: Goal: Risk for activity intolerance will decrease Outcome: Progressing   Problem: Pain Managment: Goal: General experience of comfort will improve and/or be controlled Outcome: Progressing   Problem: Safety: Goal: Ability to remain free from injury will improve Outcome: Progressing   Problem: Respiratory: Goal: Levels of oxygenation will improve Outcome: Progressing   "
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
## 2024-12-19 NOTE — Progress Notes (Signed)
 Occupational Therapy Treatment Patient Details Name: [name]. MRN: [id number] DOB: May 19, 1941 Today's Date: 12/19/2024   History of present illness Pt is an 84 y.o. 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  MD assessment includes: acute on chronic respiratory failure with hypoxia, right seventh rib and T8 fracture on CT 12/05/2024, acute on chronic systolic CHF, and physical deconditioning.   OT comments  Pt seen for OT tx. Pt denies significant complaints and agreeable. Pt required incr effort/time and heavy reliance on the bed rail to complete sup>sit EOB. Once sitting after a brief rest break, pt attempted to stand from std height bed and unsuccessful, using momentum in effort to help. VC for anterior weight shift closer to the EOB and MIN A required to stand on 4th attempt. Pt took ~8' with RW. VC for PLB once seated. Pt then completed IS use with good recall of proper technique without additional training. Pt progressing towards goals.       If plan is discharge home, recommend the following:  A little help with walking and/or transfers;A little help with bathing/dressing/bathroom;Supervision due to cognitive status   Equipment Recommendations  None recommended by OT    Recommendations for Other Services      Precautions / Restrictions Precautions Precautions: Fall Recall of Precautions/Restrictions: Impaired Restrictions Weight Bearing Restrictions Per Provider Order: No Other Position/Activity Restrictions: Watch SpO2 and RR       Mobility Bed Mobility Overal bed mobility: Needs  Assistance Bed Mobility: Supine to Sit     Supine to sit: Used rails, HOB elevated, Supervision     General bed mobility comments: incr effort to perform, heavy reliance on bed rail    Transfers Overall transfer level: Needs assistance Equipment used: Rolling walker (2 wheels) Transfers: Sit to/from Stand Sit to Stand: Min assist, Contact guard assist           General transfer comment: initial few attempts pt rocking for momentum and unable to power up. VC for anterior weight shift closer to the EOB and MIN A required to stand on 4th attempt     Balance Overall balance assessment: Needs assistance Sitting-balance support: Feet supported Sitting balance-Leahy Scale: Good     Standing balance support: Bilateral upper extremity supported Standing balance-Leahy Scale: Fair            Hotel Manager: No apparent difficulties   Cognition Arousal: Alert Behavior During Therapy: WFL for tasks assessed/performed         Following commands: Intact        Cueing   Cueing Techniques: Verbal cues             Pertinent Vitals/ Pain       Pain Assessment Pain Assessment: No/denies pain   Frequency  Min 2X/week        Progress Toward Goals  OT Goals(current goals can now be found in the care plan section)  Progress towards OT goals: Progressing toward goals  Acute Rehab OT Goals Patient Stated Goal: go home OT Goal Formulation: With patient Time For Goal Achievement: 12/22/24 Potential to Achieve Goals: Good  Plan      Co-evaluation  AM-PAC OT 6 Clicks Daily Activity     Outcome Measure   Help from another person eating meals?: None Help from another person taking care of personal grooming?: A Little Help from another person toileting, which includes using toliet, bedpan, or urinal?: A Little Help from another person bathing (including washing, rinsing, drying)?: A Little Help from another  person to put on and taking off regular upper body clothing?: None Help from another person to put on and taking off regular lower body clothing?: A Little 6 Click Score: 20    End of Session Equipment Utilized During Treatment: Rolling walker (2 wheels);Oxygen   OT Visit Diagnosis: Other abnormalities of gait and mobility (R26.89);Muscle weakness (generalized) (M62.81)   Activity Tolerance Patient tolerated treatment well   Patient Left in chair;with call bell/phone within reach;with chair alarm set   Nurse Communication          Time: [id number] OT Time Calculation (min): 10 min  Charges: OT General Charges $OT Visit: 1 Visit OT Treatments $Therapeutic Activity: 8-22 mins  Warren SAUNDERS., MPH, MS, OTR/L ascom [phone] 12/19/2024, 1:26 PM

## 2024-12-19 NOTE — Progress Notes (Signed)
 "    PULMONOLOGY         Date: 12/19/2024,   MRN# [id number] [name]. 1941/12/01     AdmissionWeight: 102.1 kg                 CurrentWeight: 99.2 kg  Referring provider: Dr Franchot   CHIEF COMPLAINT:   Acute on chronic hypoxemic respiratory failure   HISTORY OF PRESENT ILLNESS   This 84 year old very pleasant male with known interstitial lung disease and CKD as well as background history of COVID-19, sleep apnea, pulmonary fibrosis, essential hypertension, CHF, who came in with acute on chronic hypoxemic respiratory failure with what appears to be an underlying ILD with acute exacerbation.  Patient has been treated with antibiotics as well as steroids and has partially improved with weaning down of oxygen  from 6  to 3 L nasal cannula however continues to desaturate with any activity including ambulation to mid 80s.  His last x-ray was performed January 1 and it shows chronic interstitial lung disease without any changes and no evidence of acute pneumonia.  PCCM consultation placed for additional evaluation and management of complex comorbid pulmonary patient with acute exacerbation of underlying interstitial lung disease  12/16/24- patient has had 2 recruitment treatments with Metaneb device.  He is able to take deeper breaths now, this morning using IS he had tidal volumes > . He is on 3L/min.  PT/OT is working with him.  His CRP is markedly elevated despite the IV steroids that he has received and even with methotrexate .  He does not appear to have pneumonia.  I have increased his inflammatory regimen by increasing steroids with decadron  8 BID and started cellcept  which we can continue on outpatient. He reports dizziness and orthopnea , I think this may be related to hypovolemia.  Lets give him a break from diuretic. I reveiwed medications with pharmacist.   12/18/24- patient seen at bedside.  He feels improved.  He is on 2L/min [place].  Today we will transition IV  cellcept  to PO and reduce decadron  to 6iv bid. He may initiate dc planning phase of care from pulmonary perspective  12/19/24- patient seen at bedside.  Reports clinical improvement with less dyspnea on exertion.  CRP levels have trended down nicely from 6 to 1. He will need cellcept  and prednisone  at discharge. Cellcept  will be add on new medication for him due to recurrent IPF exacerbation  PAST MEDICAL HISTORY   Past Medical History:  Diagnosis Date   Allergy    Arthritis    Coronary artery disease    COVID-19 11/19/2019   Dysrhythmia    Hypertension    Peripheral vascular disease    Pulmonary fibrosis (HCC)    Sleep apnea    CPAP   Tremor      SURGICAL HISTORY   Past Surgical History:  Procedure Laterality Date   CARDIAC SURGERY     CARDIOVERSION N/A 03/03/2018   Procedure: CARDIOVERSION;  Surgeon: Hester Wolm PARAS, MD;  Location: [facility];  Service: Cardiovascular;  Laterality: N/A;   CARDIOVERSION N/A 05/24/2018   Procedure: CARDIOVERSION;  Surgeon: Hester Wolm PARAS, MD;  Location: [facility];  Service: Cardiovascular;  Laterality: N/A;   CATARACT EXTRACTION W/PHACO Right 06/03/2020   Procedure: CATARACT EXTRACTION PHACO AND INTRAOCULAR LENS PLACEMENT (IOC) RIGHT 6.07  00:48.0;  Surgeon: Myrna Adine Anes, MD;  Location: [facility];  Service: Ophthalmology;  Laterality: Right;  sleep apnea   CORONARY ARTERY BYPASS GRAFT  EYE SURGERY     LEG SURGERY Left    stents placed, Fem-Fem bypass   LOWER EXTREMITY ANGIOGRAPHY Left 09/01/2022   Procedure: Lower Extremity Angiography;  Surgeon: Jama Cordella MATSU, MD;  Location: [facility];  Service: Cardiovascular;  Laterality: Left;   RIGHT HEART CATH Right 07/24/2024   Procedure: RIGHT HEART CATH;  Surgeon: Florencio Cara BIRCH, MD;  Location: [facility];  Service: Cardiovascular;  Laterality: Right;   RIGHT/LEFT HEART CATH AND CORONARY ANGIOGRAPHY Bilateral 02/12/2022   Procedure: RIGHT/LEFT HEART CATH  AND CORONARY ANGIOGRAPHY;  Surgeon: Hester Wolm PARAS, MD;  Location: [facility];  Service: Cardiovascular;  Laterality: Bilateral;   TONSILLECTOMY       FAMILY HISTORY   Family History  Problem Relation Age of Onset   Thyroid  disease Mother    Macular degeneration Mother    Cancer Father      SOCIAL HISTORY   Social History[1]   MEDICATIONS    Home Medication:    Current Medication: Current Medications[2]    ALLERGIES   Fish allergy, Spironolactone, Hydrocodone , Tramadol , Hydrochlorothiazide, Iodine , and Shellfish allergy     REVIEW OF SYSTEMS    Review of Systems:  Gen:  Denies  fever, sweats, chills weigh loss  HEENT: Denies blurred vision, double vision, ear pain, eye pain, hearing loss, nose bleeds, sore throat Cardiac:  No dizziness, chest pain or heaviness, chest tightness,edema Resp:   reports dyspnea chronically  Gi: Denies swallowing difficulty, stomach pain, nausea or vomiting, diarrhea, constipation, bowel incontinence Gu:  Denies bladder incontinence, burning urine Ext:   Denies Joint pain, stiffness or swelling Skin: Denies  skin rash, easy bruising or bleeding or hives Endoc:  Denies polyuria, polydipsia , polyphagia or weight change Psych:   Denies depression, insomnia or hallucinations   Other:  All other systems negative   VS: BP (!) 147/92 (BP Location: Right Arm)   Pulse 81   Temp 97.6 F (36.4 C)   Resp 20   Ht 5' 11 (1.803 m)   Wt 99.2 kg   SpO2 100%   BMI 30.50 kg/m      PHYSICAL EXAM    GENERAL:NAD, no fevers, chills, no weakness no fatigue HEAD: Normocephalic, atraumatic.  EYES: Pupils equal, round, reactive to light. Extraocular muscles intact. No scleral icterus.  MOUTH: Moist mucosal membrane. Dentition intact. No abscess noted.  EAR, NOSE, THROAT: Clear without exudates. No external lesions.  NECK: Supple. No thyromegaly. No nodules. No JVD.  PULMONARY: decreased breath sounds with mild rhonchi worse  at bases bilaterally.  CARDIOVASCULAR: S1 and S2. Regular rate and rhythm. No murmurs, rubs, or gallops. No edema. Pedal pulses 2+ bilaterally.  GASTROINTESTINAL: Soft, nontender, nondistended. No masses. Positive bowel sounds. No hepatosplenomegaly.  MUSCULOSKELETAL: No swelling, clubbing, or edema. Range of motion full in all extremities.  NEUROLOGIC: Cranial nerves II through XII are intact. No gross focal neurological deficits. Sensation intact. Reflexes intact.  SKIN: No ulceration, lesions, rashes, or cyanosis. Skin warm and dry. Turgor intact.  PSYCHIATRIC: Mood, affect within normal limits. The patient is awake, alert and oriented x 3. Insight, judgment intact.       IMAGING     ASSESSMENT/PLAN   Acute exacerbation of ILD      Patient with hypoxemia with pulmonary fibrosis     - continue methotrexate       - continue IS and Flutter valve     - continue steroids increase to decadron  8 bid       -  bactrim  three times per week     - morphine  PRN     Atelectasis at bases bilaterally     - metaneb TID with RT   Acute on chronic systolic CHF exacerbation      Continue diuresis with torsemide     Strict Is&Os            Thank you for allowing me to participate in the care of this patient.   Patient/Family are satisfied with care plan and all questions have been answered.    Provider disclosure: Patient with at least one acute or chronic illness or injury that poses a threat to life or bodily function and is being managed actively during this encounter.  All of the below services have been performed independently by signing provider:  review of prior documentation from internal and or external health records.  Review of previous and current lab results.  Interview and comprehensive assessment during patient visit today. Review of current and previous chest radiographs/CT scans. Discussion of management and test interpretation with health care team and patient/family.    This document was prepared using Dragon voice recognition software and may include unintentional dictation errors.     Tymira Horkey, M.D.  Division of Pulmonary & Critical Care Medicine                [1]  Social History Tobacco Use   Smoking status: Former    Current packs/day: 0.00    Types: Cigarettes    Quit date: 02/16/1979    Years since quitting: 45.8   Smokeless tobacco: Never  Vaping Use   Vaping status: Never Used  Substance Use Topics   Alcohol  use: Yes    Comment: rarely   Drug use: No  [2]  Current Facility-Administered Medications:    acetaminophen  (TYLENOL ) tablet 650 mg, 650 mg, Oral, Q6H PRN **OR** acetaminophen  (TYLENOL ) suppository 650 mg, 650 mg, Rectal, Q6H PRN, Cleatus Delayne GAILS, MD   albuterol  (PROVENTIL ) (2.5 MG/3ML) 0.083% nebulizer solution 2.5 mg, 2.5 mg, Nebulization, Q2H PRN, Cleatus Delayne V, MD, 2.5 mg at 12/19/24 9278   allopurinol  (ZYLOPRIM ) tablet 100 mg, 100 mg, Oral, Daily, Cleatus Delayne V, MD, 100 mg at 12/18/24 0816   apixaban  (ELIQUIS ) tablet 5 mg, 5 mg, Oral, BID, Cleatus Delayne V, MD, 5 mg at 12/18/24 2152   atorvastatin  (LIPITOR) tablet 40 mg, 40 mg, Oral, Daily, Cleatus Delayne V, MD, 40 mg at 12/18/24 1737   dexamethasone  (DECADRON ) injection 6 mg, 6 mg, Intravenous, Q12H, Baudelia Schroepfer, MD, 6 mg at 12/18/24 2152   diltiazem  (CARDIZEM  CD) 24 hr capsule 240 mg, 240 mg, Oral, Daily, Awanda City, MD, 240 mg at 12/18/24 0816   DULoxetine  (CYMBALTA ) DR capsule 60 mg, 60 mg, Oral, Daily, Cleatus Delayne V, MD, 60 mg at 12/18/24 0816   feeding supplement (ENSURE PLUS HIGH PROTEIN) liquid 237 mL, 237 mL, Oral, BID BM, Awanda City, MD, 237 mL at 12/18/24 0816   folic acid  (FOLVITE ) tablet 1 mg, 1 mg, Oral, Daily, Awanda City, MD, 1 mg at 12/18/24 9183   guaiFENesin -dextromethorphan (ROBITUSSIN DM) 100-10 MG/5ML syrup 5 mL, 5 mL, Oral, Q4H PRN, Cleatus Delayne GAILS, MD   hydrALAZINE  (APRESOLINE ) injection 10 mg, 10 mg, Intravenous, Q6H PRN, Awanda City, MD, 10 mg at 12/08/24 1350   HYDROcodone -acetaminophen  (NORCO/VICODIN) 5-325 MG per tablet 1-2 tablet, 1-2 tablet, Oral, Q4H PRN, Cleatus Delayne GAILS, MD   insulin  aspart (novoLOG ) injection 0-15 Units, 0-15 Units, Subcutaneous, TID WC, Cleatus, Delayne GAILS,  MD, 2 Units at 12/18/24 1738   insulin  aspart (novoLOG ) injection 0-5 Units, 0-5 Units, Subcutaneous, QHS, Cleatus Delayne GAILS, MD, 2 Units at 12/07/24 2326   lactulose  (CHRONULAC ) 10 GM/15ML solution 20 g, 20 g, Oral, BID, Franchot Novel, MD, 20 g at 12/18/24 0816   levothyroxine  (SYNTHROID ) tablet 25 mcg, 25 mcg, Oral, Q0600, Cleatus Delayne GAILS, MD, 25 mcg at 12/19/24 0546   methotrexate  (RHEUMATREX) tablet 10 mg, 10 mg, Oral, Weekly, Cleatus Delayne V, MD, 10 mg at 12/17/24 0825   morphine  (PF) 2 MG/ML injection 2 mg, 2 mg, Intravenous, Q2H PRN, Cleatus Delayne GAILS, MD   mycophenolate  (CELLCEPT ) capsule 1,000 mg, 1,000 mg, Oral, BID, Jelena Malicoat, MD, 1,000 mg at 12/18/24 2152   ondansetron  (ZOFRAN ) tablet 4 mg, 4 mg, Oral, Q6H PRN, 4 mg at 12/14/24 0953 **OR** ondansetron  (ZOFRAN ) injection 4 mg, 4 mg, Intravenous, Q6H PRN, Cleatus Delayne GAILS, MD   pantoprazole  (PROTONIX ) injection 40 mg, 40 mg, Intravenous, Q24H, Adara Kittle, MD, 40 mg at 12/18/24 9183   sulfamethoxazole -trimethoprim  (BACTRIM ) 400-80 MG per tablet 1 tablet, 1 tablet, Oral, Once per day on Monday Wednesday Friday, Cleatus Delayne GAILS, MD, 1 tablet at 12/18/24 9183   Vitamin D  (Ergocalciferol ) (DRISDOL ) 1.25 MG (50000 UNIT) capsule 50,000 Units, 50,000 Units, Oral, Q7 days, Franchot Novel, MD, 50,000 Units at 12/14/24 1822  "

## 2024-12-19 NOTE — TOC Progression Note (Signed)
 Transition of Care (TOC) - Progression Note    Patient Details  Name: [name]. MRN: [id number] Date of Birth: 03/17/1941  Transition of [facility]) CM/SW Contact  Dalia GORMAN Fuse, RN Phone Number: 12/19/2024, 9:46 AM  Clinical Narrative:     The patient and his wife prefer [facility]. TOC lvmm for Tammy with [employer] to request ins auth for [facility]. [facility] was selcted in the hub.  TOC will continue to follow.                    Expected Discharge Plan and Services                                               Social Drivers of Health (SDOH) Interventions SDOH Screenings   Food Insecurity: No Food Insecurity (12/08/2024)  Housing: Low Risk (12/08/2024)  Transportation Needs: No Transportation Needs (12/08/2024)  Utilities: Not At Risk (12/08/2024)  Financial Resource Strain: Low Risk  (09/14/2023)   [facility]  Social Connections: Moderately Integrated (12/08/2024)  Tobacco Use: Medium Risk (12/07/2024)    Readmission Risk Interventions     No data to display

## 2024-12-20 DIAGNOSIS — J9621 Acute and chronic respiratory failure with hypoxia: Secondary | ICD-10-CM | POA: Diagnosis not present

## 2024-12-20 LAB — GLUCOSE, CAPILLARY
Glucose-Capillary: 136 mg/dL — ABNORMAL HIGH (ref 70–99)
Glucose-Capillary: 154 mg/dL — ABNORMAL HIGH (ref 70–99)
Glucose-Capillary: 190 mg/dL — ABNORMAL HIGH (ref 70–99)
Glucose-Capillary: 150 mg/dL — ABNORMAL HIGH (ref 70–99)

## 2024-12-20 LAB — CBC WITH DIFFERENTIAL/PLATELET
Abs Immature Granulocytes: 0.07 K/uL (ref 0.00–0.07)
Basophils Absolute: 0 K/uL (ref 0.0–0.1)
Basophils Relative: 0 %
Eosinophils Absolute: 0 K/uL (ref 0.0–0.5)
Eosinophils Relative: 0 %
HCT: 38.4 % — ABNORMAL LOW (ref 39.0–52.0)
Hemoglobin: 12.6 g/dL — ABNORMAL LOW (ref 13.0–17.0)
Immature Granulocytes: 1 %
Lymphocytes Relative: 2 %
Lymphs Abs: 0.3 K/uL — ABNORMAL LOW (ref 0.7–4.0)
MCH: 34.1 pg — ABNORMAL HIGH (ref 26.0–34.0)
MCHC: 32.8 g/dL (ref 30.0–36.0)
MCV: 103.8 fL — ABNORMAL HIGH (ref 80.0–100.0)
Monocytes Absolute: 0.2 K/uL (ref 0.1–1.0)
Monocytes Relative: 1 %
Neutro Abs: 11 K/uL — ABNORMAL HIGH (ref 1.7–7.7)
Neutrophils Relative %: 96 %
Platelets: 125 K/uL — ABNORMAL LOW (ref 150–400)
RBC: 3.7 MIL/uL — ABNORMAL LOW (ref 4.22–5.81)
RDW: 15.4 % (ref 11.5–15.5)
WBC: 11.5 K/uL — ABNORMAL HIGH (ref 4.0–10.5)
nRBC: 0 % (ref 0.0–0.2)

## 2024-12-20 LAB — BASIC METABOLIC PANEL WITH GFR
Anion gap: 8 (ref 5–15)
BUN: 41 mg/dL — ABNORMAL HIGH (ref 8–23)
CO2: 30 mmol/L (ref 22–32)
Calcium: 9.1 mg/dL (ref 8.9–10.3)
Chloride: 102 mmol/L (ref 98–111)
Creatinine, Ser: 0.92 mg/dL (ref 0.61–1.24)
GFR, Estimated: >60 mL/min
Glucose, Bld: 150 mg/dL — ABNORMAL HIGH (ref 70–99)
Potassium: 5.2 mmol/L — ABNORMAL HIGH (ref 3.5–5.1)
Sodium: 140 mmol/L (ref 135–145)

## 2024-12-20 LAB — MAGNESIUM: Magnesium: 2.6 mg/dL — ABNORMAL HIGH (ref 1.7–2.4)

## 2024-12-20 MED ORDER — SODIUM ZIRCONIUM CYCLOSILICATE 10 G PO PACK
10.0000 g | PACK | Freq: Once | ORAL | Status: AC
  Administered 2024-12-20: 10 g via ORAL
  Filled 2024-12-20: qty 1

## 2024-12-20 MED ORDER — SODIUM POLYSTYRENE SULFONATE 15 GM/60ML CO SUSP
30.0000 g | Freq: Once | Status: DC
  Filled 2024-12-20: qty 120

## 2024-12-20 NOTE — Plan of Care (Signed)
" °  Problem: Education: Goal: Ability to describe self-care measures that may prevent or decrease complications (Diabetes Survival Skills Education) will improve Outcome: Progressing   Problem: Coping: Goal: Ability to adjust to condition or change in health will improve Outcome: Progressing   Problem: Fluid Volume: Goal: Ability to maintain a balanced intake and output will improve Outcome: Progressing   Problem: Health Behavior/Discharge Planning: Goal: Ability to identify and utilize available resources and services will improve Outcome: Progressing Goal: Ability to manage health-related needs will improve Outcome: Progressing   Problem: Metabolic: Goal: Ability to maintain appropriate glucose levels will improve Outcome: Progressing   Problem: Nutritional: Goal: Maintenance of adequate nutrition will improve Outcome: Progressing Goal: Progress toward achieving an optimal weight will improve Outcome: Progressing   Problem: Skin Integrity: Goal: Risk for impaired skin integrity will decrease Outcome: Progressing   Problem: Tissue Perfusion: Goal: Adequacy of tissue perfusion will improve Outcome: Progressing   Problem: Education: Goal: Knowledge of General Education information will improve Description: Including pain rating scale, medication(s)/side effects and non-pharmacologic comfort measures Outcome: Progressing   Problem: Health Behavior/Discharge Planning: Goal: Ability to manage health-related needs will improve Outcome: Progressing   Problem: Clinical Measurements: Goal: Ability to maintain clinical measurements within normal limits will improve Outcome: Progressing Goal: Will remain free from infection Outcome: Progressing Goal: Diagnostic test results will improve Outcome: Progressing Goal: Respiratory complications will improve Outcome: Progressing Goal: Cardiovascular complication will be avoided Outcome: Progressing   Problem: Activity: Goal:  Risk for activity intolerance will decrease Outcome: Progressing   Problem: Nutrition: Goal: Adequate nutrition will be maintained Outcome: Progressing   Problem: Coping: Goal: Level of anxiety will decrease Outcome: Progressing   Problem: Elimination: Goal: Will not experience complications related to bowel motility Outcome: Progressing Goal: Will not experience complications related to urinary retention Outcome: Progressing   Problem: Pain Managment: Goal: General experience of comfort will improve and/or be controlled Outcome: Progressing   Problem: Safety: Goal: Ability to remain free from injury will improve Outcome: Progressing   Problem: Skin Integrity: Goal: Risk for impaired skin integrity will decrease Outcome: Progressing   Problem: Education: Goal: Ability to demonstrate management of disease process will improve Outcome: Progressing Goal: Ability to verbalize understanding of medication therapies will improve Outcome: Progressing Goal: Individualized Educational Video(s) Outcome: Progressing   Problem: Activity: Goal: Capacity to carry out activities will improve Outcome: Progressing   Problem: Cardiac: Goal: Ability to achieve and maintain adequate cardiopulmonary perfusion will improve Outcome: Progressing   Problem: Education: Goal: Knowledge of disease or condition will improve Outcome: Progressing Goal: Knowledge of the prescribed therapeutic regimen will improve Outcome: Progressing Goal: Individualized Educational Video(s) Outcome: Progressing   Problem: Activity: Goal: Ability to tolerate increased activity will improve Outcome: Progressing Goal: Will verbalize the importance of balancing activity with adequate rest periods Outcome: Progressing   Problem: Respiratory: Goal: Ability to maintain a clear airway will improve Outcome: Progressing Goal: Levels of oxygenation will improve Outcome: Progressing Goal: Ability to maintain  adequate ventilation will improve Outcome: Progressing   "

## 2024-12-20 NOTE — TOC Progression Note (Incomplete)
 Transition of Care (TOC) - Progression Note    Patient Details  Name: [name]. MRN: [id number] Date of Birth: Jul 16, 1941  [facility]) CM/SW Contact  Grayce JAYSON Perfect, RN Phone Number: 12/20/2024, 10:35 AM  Clinical Narrative:     RN met with patient in his room.  Continue to await authorization for short term rehab.  Notified him that approval was sent for his wife to discharge to [facility] .  He states understanding and will notify his son.                     Expected Discharge Plan and Services                                               Social Drivers of Health (SDOH) Interventions SDOH Screenings   Food Insecurity: No Food Insecurity (12/08/2024)  Housing: Low Risk (12/08/2024)  Transportation Needs: No Transportation Needs (12/08/2024)  Utilities: Not At Risk (12/08/2024)  Financial Resource Strain: Low Risk  (09/14/2023)   Received from [facility]  Social Connections: Moderately Integrated (12/08/2024)  Tobacco Use: Medium Risk (12/07/2024)    Readmission Risk Interventions     No data to display

## 2024-12-20 NOTE — Progress Notes (Signed)
 "    PULMONOLOGY         Date: 12/20/2024,   MRN# [id number] [name]. 06/08/1941     AdmissionWeight: 102.1 kg                 CurrentWeight: 98.6 kg  Referring provider: Dr Franchot   CHIEF COMPLAINT:   Acute on chronic hypoxemic respiratory failure   HISTORY OF PRESENT ILLNESS   This 84 year old very pleasant male with known interstitial lung disease and CKD as well as background history of COVID-19, sleep apnea, pulmonary fibrosis, essential hypertension, CHF, who came in with acute on chronic hypoxemic respiratory failure with what appears to be an underlying ILD with acute exacerbation.  Patient has been treated with antibiotics as well as steroids and has partially improved with weaning down of oxygen  from 6  to 3 L nasal cannula however continues to desaturate with any activity including ambulation to mid 80s.  His last x-ray was performed January 1 and it shows chronic interstitial lung disease without any changes and no evidence of acute pneumonia.  PCCM consultation placed for additional evaluation and management of complex comorbid pulmonary patient with acute exacerbation of underlying interstitial lung disease  12/16/24- patient has had 2 recruitment treatments with Metaneb device.  He is able to take deeper breaths now, this morning using IS he had tidal volumes > . He is on 3L/min.  PT/OT is working with him.  His CRP is markedly elevated despite the IV steroids that he has received and even with methotrexate .  He does not appear to have pneumonia.  I have increased his inflammatory regimen by increasing steroids with decadron  8 BID and started cellcept  which we can continue on outpatient. He reports dizziness and orthopnea , I think this may be related to hypovolemia.  Lets give him a break from diuretic. I reveiwed medications with pharmacist.   12/18/24- patient seen at bedside.  He feels improved.  He is on 2L/min [place].  Today we will transition IV  cellcept  to PO and reduce decadron  to 6iv bid. He may initiate dc planning phase of care from pulmonary perspective  12/19/24- patient seen at bedside.  Reports clinical improvement with less dyspnea on exertion.  CRP levels have trended down nicely from 6 to 1. He will need cellcept  and prednisone  at discharge. Cellcept  will be add on new medication for him due to recurrent IPF exacerbation  12/20/24- patient feels better this am.  We plan to dc home soon with cellcept /methotrexate /decadron  and I will follow up on outpatient  PAST MEDICAL HISTORY   Past Medical History:  Diagnosis Date   Allergy    Arthritis    Coronary artery disease    COVID-19 11/19/2019   Dysrhythmia    Hypertension    Peripheral vascular disease    Pulmonary fibrosis (HCC)    Sleep apnea    CPAP   Tremor      SURGICAL HISTORY   Past Surgical History:  Procedure Laterality Date   CARDIAC SURGERY     CARDIOVERSION N/A 03/03/2018   Procedure: CARDIOVERSION;  Surgeon: Hester Wolm PARAS, MD;  Location: [facility];  Service: Cardiovascular;  Laterality: N/A;   CARDIOVERSION N/A 05/24/2018   Procedure: CARDIOVERSION;  Surgeon: Hester Wolm PARAS, MD;  Location: [facility];  Service: Cardiovascular;  Laterality: N/A;   CATARACT EXTRACTION W/PHACO Right 06/03/2020   Procedure: CATARACT EXTRACTION PHACO AND INTRAOCULAR LENS PLACEMENT (IOC) RIGHT 6.07  00:48.0;  Surgeon: Myrna Adine Anes, MD;  Location: [facility];  Service: Ophthalmology;  Laterality: Right;  sleep apnea   CORONARY ARTERY BYPASS GRAFT     EYE SURGERY     LEG SURGERY Left    stents placed, Fem-Fem bypass   LOWER EXTREMITY ANGIOGRAPHY Left 09/01/2022   Procedure: Lower Extremity Angiography;  Surgeon: Jama Cordella MATSU, MD;  Location: [facility];  Service: Cardiovascular;  Laterality: Left;   RIGHT HEART CATH Right 07/24/2024   Procedure: RIGHT HEART CATH;  Surgeon: Florencio Cara BIRCH, MD;  Location: [facility];  Service:  Cardiovascular;  Laterality: Right;   RIGHT/LEFT HEART CATH AND CORONARY ANGIOGRAPHY Bilateral 02/12/2022   Procedure: RIGHT/LEFT HEART CATH AND CORONARY ANGIOGRAPHY;  Surgeon: Hester Wolm PARAS, MD;  Location: [facility];  Service: Cardiovascular;  Laterality: Bilateral;   TONSILLECTOMY       FAMILY HISTORY   Family History  Problem Relation Age of Onset   Thyroid  disease Mother    Macular degeneration Mother    Cancer Father      SOCIAL HISTORY   Social History[1]   MEDICATIONS    Home Medication:    Current Medication: Current Medications[2]    ALLERGIES   Fish allergy, Spironolactone, Hydrocodone , Tramadol , Hydrochlorothiazide, Iodine , and Shellfish allergy     REVIEW OF SYSTEMS    Review of Systems:  Gen:  Denies  fever, sweats, chills weigh loss  HEENT: Denies blurred vision, double vision, ear pain, eye pain, hearing loss, nose bleeds, sore throat Cardiac:  No dizziness, chest pain or heaviness, chest tightness,edema Resp:   reports dyspnea chronically  Gi: Denies swallowing difficulty, stomach pain, nausea or vomiting, diarrhea, constipation, bowel incontinence Gu:  Denies bladder incontinence, burning urine Ext:   Denies Joint pain, stiffness or swelling Skin: Denies  skin rash, easy bruising or bleeding or hives Endoc:  Denies polyuria, polydipsia , polyphagia or weight change Psych:   Denies depression, insomnia or hallucinations   Other:  All other systems negative   VS: BP (!) 157/93 (BP Location: Right Arm)   Pulse 83   Temp (!) 97.5 F (36.4 C)   Resp 18   Ht 5' 11 (1.803 m)   Wt 98.6 kg   SpO2 96%   BMI 30.32 kg/m      PHYSICAL EXAM    GENERAL:NAD, no fevers, chills, no weakness no fatigue HEAD: Normocephalic, atraumatic.  EYES: Pupils equal, round, reactive to light. Extraocular muscles intact. No scleral icterus.  MOUTH: Moist mucosal membrane. Dentition intact. No abscess noted.  EAR, NOSE, THROAT: Clear without  exudates. No external lesions.  NECK: Supple. No thyromegaly. No nodules. No JVD.  PULMONARY: decreased breath sounds with mild rhonchi worse at bases bilaterally.  CARDIOVASCULAR: S1 and S2. Regular rate and rhythm. No murmurs, rubs, or gallops. No edema. Pedal pulses 2+ bilaterally.  GASTROINTESTINAL: Soft, nontender, nondistended. No masses. Positive bowel sounds. No hepatosplenomegaly.  MUSCULOSKELETAL: No swelling, clubbing, or edema. Range of motion full in all extremities.  NEUROLOGIC: Cranial nerves II through XII are intact. No gross focal neurological deficits. Sensation intact. Reflexes intact.  SKIN: No ulceration, lesions, rashes, or cyanosis. Skin warm and dry. Turgor intact.  PSYCHIATRIC: Mood, affect within normal limits. The patient is awake, alert and oriented x 3. Insight, judgment intact.       IMAGING     ASSESSMENT/PLAN   Acute exacerbation of ILD      Patient with hypoxemia with pulmonary fibrosis     - continue methotrexate       -  continue IS and Flutter valve     - continue steroids increase to decadron  8 bid       - bactrim  three times per week     - morphine  PRN     Atelectasis at bases bilaterally     - metaneb TID with RT   Acute on chronic systolic CHF exacerbation      Continue diuresis with torsemide     Strict Is&Os            Thank you for allowing me to participate in the care of this patient.   Patient/Family are satisfied with care plan and all questions have been answered.    Provider disclosure: Patient with at least one acute or chronic illness or injury that poses a threat to life or bodily function and is being managed actively during this encounter.  All of the below services have been performed independently by signing provider:  review of prior documentation from internal and or external health records.  Review of previous and current lab results.  Interview and comprehensive assessment during patient visit today. Review  of current and previous chest radiographs/CT scans. Discussion of management and test interpretation with health care team and patient/family.   This document was prepared using Dragon voice recognition software and may include unintentional dictation errors.     Markasia Carrol, M.D.  Division of Pulmonary & Critical Care Medicine                 [1]  Social History Tobacco Use   Smoking status: Former    Current packs/day: 0.00    Types: Cigarettes    Quit date: 02/16/1979    Years since quitting: 45.8   Smokeless tobacco: Never  Vaping Use   Vaping status: Never Used  Substance Use Topics   Alcohol  use: Yes    Comment: rarely   Drug use: No  [2]  Current Facility-Administered Medications:    acetaminophen  (TYLENOL ) tablet 650 mg, 650 mg, Oral, Q6H PRN **OR** acetaminophen  (TYLENOL ) suppository 650 mg, 650 mg, Rectal, Q6H PRN, Cleatus Delayne GAILS, MD   albuterol  (PROVENTIL ) (2.5 MG/3ML) 0.083% nebulizer solution 2.5 mg, 2.5 mg, Nebulization, Q2H PRN, Cleatus Delayne V, MD, 2.5 mg at 12/19/24 1343   allopurinol  (ZYLOPRIM ) tablet 100 mg, 100 mg, Oral, Daily, Cleatus Delayne V, MD, 100 mg at 12/20/24 9192   apixaban  (ELIQUIS ) tablet 5 mg, 5 mg, Oral, BID, Cleatus Delayne V, MD, 5 mg at 12/20/24 9191   atorvastatin  (LIPITOR) tablet 40 mg, 40 mg, Oral, Daily, Cleatus Delayne V, MD, 40 mg at 12/19/24 1738   dexamethasone  (DECADRON ) injection 4 mg, 4 mg, Intravenous, Q12H, Brylie Sneath, MD, 4 mg at 12/20/24 0807   diltiazem  (CARDIZEM  CD) 24 hr capsule 240 mg, 240 mg, Oral, Daily, Awanda City, MD, 240 mg at 12/19/24 0844   DULoxetine  (CYMBALTA ) DR capsule 60 mg, 60 mg, Oral, Daily, Cleatus Delayne V, MD, 60 mg at 12/20/24 0807   feeding supplement (ENSURE PLUS HIGH PROTEIN) liquid 237 mL, 237 mL, Oral, BID BM, Awanda City, MD, 237 mL at 12/19/24 0841   folic acid  (FOLVITE ) tablet 1 mg, 1 mg, Oral, Daily, Awanda City, MD, 1 mg at 12/20/24 0808   guaiFENesin -dextromethorphan (ROBITUSSIN DM)  100-10 MG/5ML syrup 5 mL, 5 mL, Oral, Q4H PRN, Cleatus Delayne GAILS, MD   hydrALAZINE  (APRESOLINE ) injection 10 mg, 10 mg, Intravenous, Q6H PRN, Awanda City, MD, 10 mg at 12/08/24 1350   HYDROcodone -acetaminophen  (NORCO/VICODIN) 5-325 MG per tablet 1-2 tablet,  1-2 tablet, Oral, Q4H PRN, Cleatus Delayne GAILS, MD   insulin  aspart (novoLOG ) injection 0-15 Units, 0-15 Units, Subcutaneous, TID WC, Cleatus Delayne GAILS, MD, 2 Units at 12/20/24 9193   insulin  aspart (novoLOG ) injection 0-5 Units, 0-5 Units, Subcutaneous, QHS, Cleatus Delayne GAILS, MD, 2 Units at 12/07/24 2326   levothyroxine  (SYNTHROID ) tablet 25 mcg, 25 mcg, Oral, Q0600, Cleatus Delayne GAILS, MD, 25 mcg at 12/20/24 9375   methotrexate  (RHEUMATREX) tablet 10 mg, 10 mg, Oral, Weekly, Cleatus Delayne GAILS, MD, 10 mg at 12/17/24 0825   morphine  (PF) 2 MG/ML injection 2 mg, 2 mg, Intravenous, Q2H PRN, Cleatus Delayne GAILS, MD   mycophenolate  (CELLCEPT ) capsule 1,000 mg, 1,000 mg, Oral, BID, Alyn Riedinger, MD, 1,000 mg at 12/19/24 2121   ondansetron  (ZOFRAN ) tablet 4 mg, 4 mg, Oral, Q6H PRN, 4 mg at 12/14/24 0953 **OR** ondansetron  (ZOFRAN ) injection 4 mg, 4 mg, Intravenous, Q6H PRN, Cleatus Delayne GAILS, MD   pantoprazole  (PROTONIX ) injection 40 mg, 40 mg, Intravenous, Q24H, Khamya Topp, MD, 40 mg at 12/20/24 0807   sodium polystyrene (KAYEXALATE ) 15 GM/60ML suspension 30 g, 30 g, Rectal, Once, Parris Manna, MD   sulfamethoxazole -trimethoprim  (BACTRIM ) 400-80 MG per tablet 1 tablet, 1 tablet, Oral, Once per day on Monday Wednesday Friday, Cleatus Delayne GAILS, MD, 1 tablet at 12/20/24 9190   torsemide  (DEMADEX ) tablet 10 mg, 10 mg, Oral, Daily, Franchot Novel, MD, 10 mg at 12/20/24 9192   Vitamin D  (Ergocalciferol ) (DRISDOL ) 1.25 MG (50000 UNIT) capsule 50,000 Units, 50,000 Units, Oral, Q7 days, Franchot Novel, MD, 50,000 Units at 12/14/24 1822  "
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
## 2024-12-20 NOTE — Progress Notes (Signed)
 Physical Therapy Treatment Patient Details Name: [name]. MRN: [id number] DOB: [birth year] Today's Date: 12/20/2024   History of Present Illness Pt is an 84 y.o. 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  MD assessment includes: acute on chronic respiratory failure with hypoxia, right seventh rib and T8 fracture on CT 12/05/2024, acute on chronic systolic CHF, and physical deconditioning.    PT Comments  Pt received in bed, 94% at rest on 3L O2. Focused session on gait and mobility. Pt continues to require CG/MinA for transfers. Gait training 40' x 3 with support of RW and 3 minute rest break in between. Increased fatigue upon completion with SpO2 down to 84% upon exertion. Pt awaiting d/c to STR for increased strengthening and functional progression back to baseline prior to returning home with wife    If plan is discharge home, recommend the following: Assistance with cooking/housework;Help with stairs or ramp for entrance;Assist for transportation;A little help with walking and/or transfers;A little help with bathing/dressing/bathroom   Can travel by private vehicle     No  Equipment Recommendations  Rolling walker (2 wheels)    Recommendations for Other Services       Precautions / Restrictions Precautions Precautions: Fall Recall of Precautions/Restrictions: Impaired Restrictions Weight Bearing Restrictions Per Provider Order: No Other Position/Activity Restrictions: Watch SpO2 and RR     Mobility  Bed Mobility Overal bed mobility: Needs Assistance Bed Mobility: Supine to  Sit Rolling: Supervision, Used rails         General bed mobility comments:  (Increased time to transition)    Transfers Overall transfer level: Needs assistance Equipment used: Rolling walker (2 wheels) Transfers: Sit to/from Stand Sit to Stand: Min assist, Contact guard assist                Ambulation/Gait Ambulation/Gait assistance: Min assist, Contact guard assist Gait Distance (Feet): 40 Feet Assistive device: Rolling walker (2 wheels) Gait Pattern/deviations: Step-through pattern, Decreased step length - right, Decreased step length - left, Trunk flexed Gait velocity: decreased     General Gait Details: 40' x 3  with RW and some general imbalances on 3L O2   Stairs             Wheelchair Mobility     Tilt Bed    Modified Rankin (Stroke Patients Only)       Balance Overall balance assessment: Needs assistance Sitting-balance support: Feet supported Sitting balance-Leahy Scale: Good     Standing balance support: Bilateral upper extremity supported Standing balance-Leahy Scale: Fair Standing balance comment: some general imbalances today with light min a to recover                            Communication Communication Communication: No apparent difficulties  Cognition Arousal: Alert Behavior During Therapy: WFL for tasks assessed/performed   PT - Cognitive impairments: No apparent impairments                         Following commands: Intact      Cueing Cueing Techniques: Verbal cues  Exercises      General Comments General  comments (skin integrity, edema, etc.):  (Resting SpO2on 3L O2 94%, dropping to 84% each time after ambulating 49ft)      Pertinent Vitals/Pain Pain Assessment Pain Assessment: No/denies pain    Home Living                          Prior Function            PT Goals (current goals can now be found in the care plan section) Acute Rehab PT Goals Patient Stated Goal: To  get stronger and do more without feeling tired Progress towards PT goals: Progressing toward goals    Frequency    Min 2X/week      PT Plan      Co-evaluation              AM-PAC PT 6 Clicks Mobility   Outcome Measure  Help needed turning from your back to your side while in a flat bed without using bedrails?: None Help needed moving from lying on your back to sitting on the side of a flat bed without using bedrails?: None Help needed moving to and from a bed to a chair (including a wheelchair)?: A Little Help needed standing up from a chair using your arms (e.g., wheelchair or bedside chair)?: A Little Help needed to walk in hospital room?: A Little Help needed climbing 3-5 steps with a railing? : A Little 6 Click Score: 20    End of Session Equipment Utilized During Treatment: Gait belt;Oxygen  Activity Tolerance: Patient tolerated treatment well Patient left: in chair;with call bell/phone within reach;with chair alarm set Nurse Communication: Mobility status PT Visit Diagnosis: Difficulty in walking, not elsewhere classified (R26.2);Muscle weakness (generalized) (M62.81)     Time: 8944-8882 PT Time Calculation (min) (ACUTE ONLY): 22 min  Charges:    $Therapeutic Activity: 8-22 mins PT General Charges $$ ACUTE PT VISIT: 1 Visit                    Darice Bohr, PTA  Darice JAYSON Bohr 12/20/2024, 2:00 PM

## 2024-12-21 DIAGNOSIS — J9621 Acute and chronic respiratory failure with hypoxia: Secondary | ICD-10-CM | POA: Diagnosis not present

## 2024-12-21 LAB — BASIC METABOLIC PANEL WITH GFR
Anion gap: 11 (ref 5–15)
BUN: 40 mg/dL — ABNORMAL HIGH (ref 8–23)
CO2: 29 mmol/L (ref 22–32)
Calcium: 9 mg/dL (ref 8.9–10.3)
Chloride: 102 mmol/L (ref 98–111)
Creatinine, Ser: 0.88 mg/dL (ref 0.61–1.24)
GFR, Estimated: >60 mL/min
Glucose, Bld: 148 mg/dL — ABNORMAL HIGH (ref 70–99)
Potassium: 4.6 mmol/L (ref 3.5–5.1)
Sodium: 141 mmol/L (ref 135–145)

## 2024-12-21 LAB — PROCALCITONIN: Procalcitonin: <0.1 ng/mL

## 2024-12-21 LAB — CBC
HCT: 39.3 % (ref 39.0–52.0)
Hemoglobin: 13.1 g/dL (ref 13.0–17.0)
MCH: 34 pg (ref 26.0–34.0)
MCHC: 33.3 g/dL (ref 30.0–36.0)
MCV: 102.1 fL — ABNORMAL HIGH (ref 80.0–100.0)
Platelets: 143 K/uL — ABNORMAL LOW (ref 150–400)
RBC: 3.85 MIL/uL — ABNORMAL LOW (ref 4.22–5.81)
RDW: 15.4 % (ref 11.5–15.5)
WBC: 11.8 K/uL — ABNORMAL HIGH (ref 4.0–10.5)
nRBC: 0 % (ref 0.0–0.2)

## 2024-12-21 LAB — GLUCOSE, CAPILLARY
Glucose-Capillary: 142 mg/dL — ABNORMAL HIGH (ref 70–99)
Glucose-Capillary: 136 mg/dL — ABNORMAL HIGH (ref 70–99)

## 2024-12-21 MED ORDER — DILTIAZEM HCL ER COATED BEADS 240 MG PO CP24: 240.0000 mg | ORAL_CAPSULE | Freq: Every day | ORAL | Status: AC

## 2024-12-21 MED ORDER — GUAIFENESIN ER 600 MG PO TB12: 600.0000 mg | ORAL_TABLET | Freq: Two times a day (BID) | ORAL | Status: AC

## 2024-12-21 MED ORDER — DEXAMETHASONE 4 MG PO TABS: 4.0000 mg | ORAL_TABLET | Freq: Every day | ORAL | Status: AC

## 2024-12-21 MED ORDER — VITAMIN D (ERGOCALCIFEROL) 1.25 MG (50000 UNIT) PO CAPS: 50000.0000 [IU] | ORAL_CAPSULE | ORAL | Status: AC

## 2024-12-21 MED ORDER — MYCOPHENOLATE MOFETIL 250 MG PO CAPS: 1000.0000 mg | ORAL_CAPSULE | Freq: Two times a day (BID) | ORAL | Status: AC

## 2024-12-21 MED ORDER — FOLIC ACID 1 MG PO TABS: 1.0000 mg | ORAL_TABLET | Freq: Every day | ORAL | Status: AC

## 2024-12-21 NOTE — Progress Notes (Signed)
 Occupational Therapy Treatment Patient Details Name: [name]. MRN: [id number] DOB: August 16, 1941 Today's Date: 12/21/2024   History of present illness Pt is an 84 y.o. 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  MD assessment includes: acute on chronic respiratory failure with hypoxia, right seventh rib and T8 fracture on CT 12/05/2024, acute on chronic systolic CHF, and physical deconditioning.   OT comments  Pt making good progress towards goals, session focused on edu on energy conservation, functional transfers and activity pacing. Requires supervision to exit bed using log roll technique, performs functional STS and step pivot transfers with CGA from elevated bed height, and requires cues for hand placement, body mechanics and AD proximity. Pt continues to be limited by generalized weakness, decreased tolerance to activity, poor balance and decreased safety awareness. OT will follow acutely. Discharge recommendation appropriate.       If plan is discharge home, recommend the following:  A little help with walking and/or transfers;A little help with bathing/dressing/bathroom;Supervision due to cognitive status   Equipment Recommendations  None recommended by OT       Precautions / Restrictions Precautions Precautions: Fall Recall of Precautions/Restrictions: Impaired Restrictions Weight Bearing Restrictions Per Provider Order: No       Mobility Bed Mobility Overal bed mobility: Needs Assistance Bed Mobility: Supine to Sit Rolling: Supervision, Used rails   Supine to sit: Used rails,  HOB elevated, Supervision     General bed mobility comments: log roll to exit bed, increased time and effort with use of bed features    Transfers Overall transfer level: Needs assistance Equipment used: Rolling walker (2 wheels) Transfers: Sit to/from Stand Sit to Stand: Supervision, From elevated surface           General transfer comment: cues for weight shift, hand placement and general sequencing. CGA from elevated bed height, anticipate MIN A from regular bed     Balance Overall balance assessment: Needs assistance Sitting-balance support: Feet supported Sitting balance-Leahy Scale: Good     Standing balance support: Bilateral upper extremity supported Standing balance-Leahy Scale: Fair Standing balance comment: 2 min standing statically                           ADL either performed or assessed with clinical judgement   ADL Overall ADL's : Needs assistance/impaired                                     Functional mobility during ADLs: Contact guard assist;Rolling walker (2 wheels) General ADL Comments: Pt edu on energy conservation strategies including PLB, activity pacing, use of pulse ox to monitor O2 levels and recovery breaks. Performs x2 STS from elevated bed CGA (anticipate MIN A from reg bed height), requires 2 min seated recovery break with increased WOB/DOE. Second standing attempt pt tolerated static standing 2 minutes. Transferred to recliner with CGA, pt attempting to sit too quickly, requiring max multimodal cues to redirect to continue standing, step back using RW and to reach back for chair with hands.     Communication Communication Communication: No apparent difficulties  Cognition Arousal: Alert Behavior During Therapy: WFL for tasks assessed/performed Cognition: No apparent impairments                               Following commands: Intact        Cueing   Cueing Techniques: Verbal cues  Exercises       Shoulder Instructions       General Comments SpO2 dropping to 87% with exertion on 3L O2 (pt's baseline). Recovers back to 93%> with time and PLB, left with SpO2 96% on 3L in recliner.    Pertinent Vitals/ Pain       Pain Assessment Pain Assessment: No/denies pain         Frequency  Min 2X/week        Progress Toward Goals  OT Goals(current goals can now be found in the care plan section)  Progress towards OT goals: Progressing toward goals  Acute Rehab OT Goals OT Goal Formulation: With patient Time For Goal Achievement: 12/22/24 Potential to Achieve Goals: Good  Plan      Co-evaluation                 AM-PAC OT 6 Clicks Daily Activity     Outcome Measure   Help from another person eating meals?: None Help from another person taking care of personal grooming?: A Little Help from another person toileting, which includes using toliet, bedpan, or urinal?: A Little Help from another person bathing (including washing, rinsing, drying)?: A Little Help from another person to put on and taking off regular upper body clothing?: None Help from another person to put on and taking off regular lower body clothing?: A Little 6 Click Score: 20    End of Session Equipment Utilized During Treatment: Gait belt;Rolling walker (2 wheels);Oxygen   OT Visit Diagnosis: Other abnormalities of gait and mobility (R26.89);Muscle weakness (generalized) (M62.81)   Activity Tolerance Patient tolerated treatment well   Patient Left in chair;with call bell/phone within reach;with chair alarm set   Nurse Communication Mobility status        Time: 8884-8867 OT Time Calculation (min): 17 min  Charges: OT General Charges $OT Visit: 1 Visit OT Treatments $Self Care/Home Management : 8-22 mins  [name], OTR/L  12/21/24, 11:42 AM

## 2024-12-21 NOTE — TOC Transition Note (Signed)
 Transition of Care [facility]) - Discharge Note   Patient Details  Name: [name]. MRN: [id number] Date of Birth: May 16, 1941  [facility] CM/SW Contact:  Grayce JAYSON Perfect, RN Phone Number: 12/21/2024, 12:20 PM   Clinical Narrative:     Patient being discharged today to [facility] for short term rehab via [facility] transport.  Patient will notify his family, wife is already at facility.  [facility] aware of discharge today and sent discharge summary.  No further TOC needs  Final next level of care: Skilled Nursing Facility Barriers to Discharge: No Barriers Identified   Patient Goals and CMS Choice            Discharge Placement   Existing PASRR number confirmed : 12/21/24          Patient chooses bed at:  ([facility]) Patient to be transferred to facility by: [facility] Name of family member notified: Patient will notify family Patient and family notified of of transfer: 12/21/24  Discharge Plan and Services Additional resources added to the After Visit Summary for                                       Social Drivers of Health (SDOH) Interventions SDOH Screenings   Food Insecurity: No Food Insecurity (12/08/2024)  Housing: Low Risk (12/08/2024)  Transportation Needs: No Transportation Needs (12/08/2024)  Utilities: Not At Risk (12/08/2024)  Financial Resource Strain: Low Risk  (09/14/2023)   Received from [facility]  Social Connections: Moderately Integrated (12/08/2024)  Tobacco Use: Medium Risk (12/07/2024)     Readmission Risk Interventions     No data to display
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
## 2024-12-21 NOTE — Progress Notes (Signed)
 Report called to [facility] [phone] patient going to room D7

## 2024-12-21 NOTE — Plan of Care (Signed)
" °  Problem: Education: Goal: Ability to describe self-care measures that may prevent or decrease complications (Diabetes Survival Skills Education) will improve Outcome: Progressing   Problem: Coping: Goal: Ability to adjust to condition or change in health will improve Outcome: Progressing   Problem: Fluid Volume: Goal: Ability to maintain a balanced intake and output will improve Outcome: Progressing   Problem: Health Behavior/Discharge Planning: Goal: Ability to identify and utilize available resources and services will improve Outcome: Progressing Goal: Ability to manage health-related needs will improve Outcome: Progressing   Problem: Metabolic: Goal: Ability to maintain appropriate glucose levels will improve Outcome: Progressing   Problem: Nutritional: Goal: Maintenance of adequate nutrition will improve Outcome: Progressing Goal: Progress toward achieving an optimal weight will improve Outcome: Progressing   Problem: Skin Integrity: Goal: Risk for impaired skin integrity will decrease Outcome: Progressing   Problem: Tissue Perfusion: Goal: Adequacy of tissue perfusion will improve Outcome: Progressing   Problem: Education: Goal: Knowledge of General Education information will improve Description: Including pain rating scale, medication(s)/side effects and non-pharmacologic comfort measures Outcome: Progressing   Problem: Health Behavior/Discharge Planning: Goal: Ability to manage health-related needs will improve Outcome: Progressing   Problem: Clinical Measurements: Goal: Ability to maintain clinical measurements within normal limits will improve Outcome: Progressing Goal: Will remain free from infection Outcome: Progressing Goal: Diagnostic test results will improve Outcome: Progressing Goal: Respiratory complications will improve Outcome: Progressing Goal: Cardiovascular complication will be avoided Outcome: Progressing   Problem: Activity: Goal:  Risk for activity intolerance will decrease Outcome: Progressing   Problem: Nutrition: Goal: Adequate nutrition will be maintained Outcome: Progressing   Problem: Coping: Goal: Level of anxiety will decrease Outcome: Progressing   Problem: Elimination: Goal: Will not experience complications related to bowel motility Outcome: Progressing Goal: Will not experience complications related to urinary retention Outcome: Progressing   Problem: Pain Managment: Goal: General experience of comfort will improve and/or be controlled Outcome: Progressing   Problem: Safety: Goal: Ability to remain free from injury will improve Outcome: Progressing   Problem: Skin Integrity: Goal: Risk for impaired skin integrity will decrease Outcome: Progressing   Problem: Education: Goal: Ability to demonstrate management of disease process will improve Outcome: Progressing Goal: Ability to verbalize understanding of medication therapies will improve Outcome: Progressing Goal: Individualized Educational Video(s) Outcome: Progressing   Problem: Activity: Goal: Capacity to carry out activities will improve Outcome: Progressing   Problem: Cardiac: Goal: Ability to achieve and maintain adequate cardiopulmonary perfusion will improve Outcome: Progressing   Problem: Education: Goal: Knowledge of disease or condition will improve Outcome: Progressing Goal: Knowledge of the prescribed therapeutic regimen will improve Outcome: Progressing Goal: Individualized Educational Video(s) Outcome: Progressing   Problem: Activity: Goal: Ability to tolerate increased activity will improve Outcome: Progressing Goal: Will verbalize the importance of balancing activity with adequate rest periods Outcome: Progressing   Problem: Respiratory: Goal: Ability to maintain a clear airway will improve Outcome: Progressing Goal: Levels of oxygenation will improve Outcome: Progressing Goal: Ability to maintain  adequate ventilation will improve Outcome: Progressing   "

## 2024-12-21 NOTE — TOC Transition Note (Signed)
 Transition of Care [facility]) - Discharge Note   Patient Details  Name: [name]. MRN: [id number] Date of Birth: [birth year]  Transition of Care [facility]) CM/SW Contact:  Grayce JAYSON Perfect, RN Phone Number: 12/21/2024, 10:48 AM   Clinical Narrative:     Authorization received from [employer] for short term rehab at [facility].  #[id number] [facility] transport authorization [phone] Patient , team , [facility] notified and will continue to follow until discharge.         Patient Goals and CMS Choice            Discharge Placement                       Discharge Plan and Services Additional resources added to the After Visit Summary for                                       Social Drivers of Health (SDOH) Interventions SDOH Screenings   Food Insecurity: No Food Insecurity (12/08/2024)  Housing: Low Risk (12/08/2024)  Transportation Needs: No Transportation Needs (12/08/2024)  Utilities: Not At Risk (12/08/2024)  Financial Resource Strain: Low Risk  (09/14/2023)   Received from [facility]  Social Connections: Moderately Integrated (12/08/2024)  Tobacco Use: Medium Risk (12/07/2024)     Readmission Risk Interventions     No data to display

## 2024-12-21 NOTE — Progress Notes (Signed)
 "    PULMONOLOGY         Date: 12/21/2024,   MRN# [id number] [name]. 09-25-1941     AdmissionWeight: 102.1 kg                 CurrentWeight: 97.6 kg  Referring provider: Dr Franchot   CHIEF COMPLAINT:   Acute on chronic hypoxemic respiratory failure   HISTORY OF PRESENT ILLNESS   This 84 year old very pleasant male with known interstitial lung disease and CKD as well as background history of COVID-19, sleep apnea, pulmonary fibrosis, essential hypertension, CHF, who came in with acute on chronic hypoxemic respiratory failure with what appears to be an underlying ILD with acute exacerbation.  Patient has been treated with antibiotics as well as steroids and has partially improved with weaning down of oxygen  from 6  to 3 L nasal cannula however continues to desaturate with any activity including ambulation to mid 80s.  His last x-ray was performed January 1 and it shows chronic interstitial lung disease without any changes and no evidence of acute pneumonia.  PCCM consultation placed for additional evaluation and management of complex comorbid pulmonary patient with acute exacerbation of underlying interstitial lung disease  12/16/24- patient has had 2 recruitment treatments with Metaneb device.  He is able to take deeper breaths now, this morning using IS he had tidal volumes > . He is on 3L/min.  PT/OT is working with him.  His CRP is markedly elevated despite the IV steroids that he has received and even with methotrexate .  He does not appear to have pneumonia.  I have increased his inflammatory regimen by increasing steroids with decadron  8 BID and started cellcept  which we can continue on outpatient. He reports dizziness and orthopnea , I think this may be related to hypovolemia.  Lets give him a break from diuretic. I reveiwed medications with pharmacist.   12/18/24- patient seen at bedside.  He feels improved.  He is on 2L/min [place].  Today we will transition IV  cellcept  to PO and reduce decadron  to 6iv bid. He may initiate dc planning phase of care from pulmonary perspective  12/19/24- patient seen at bedside.  Reports clinical improvement with less dyspnea on exertion.  CRP levels have trended down nicely from 6 to 1. He will need cellcept  and prednisone  at discharge. Cellcept  will be add on new medication for him due to recurrent IPF exacerbation  12/20/24- patient feels better this am.  We plan to dc home soon with cellcept /methotrexate /decadron  and I will follow up on outpatient  12/21/24- patient cleared for dc home.  He will be seen by us  on outpatient pulmonary clinic.  He will be discharged on cellcept , methorexate and decadron  for his ILD exacerbation.  He has oxygen  at home and should continue this at 3L/min [place] PAST MEDICAL HISTORY   Past Medical History:  Diagnosis Date   Allergy    Arthritis    Coronary artery disease    COVID-19 11/19/2019   Dysrhythmia    Hypertension    Peripheral vascular disease    Pulmonary fibrosis (HCC)    Sleep apnea    CPAP   Tremor      SURGICAL HISTORY   Past Surgical History:  Procedure Laterality Date   CARDIAC SURGERY     CARDIOVERSION N/A 03/03/2018   Procedure: CARDIOVERSION;  Surgeon: Hester Wolm PARAS, MD;  Location: [facility] OR[facility];  Service: Cardiovascular;  Laterality: N/A;   CARDIOVERSION N/A 05/24/2018   Procedure: CARDIOVERSION;  Surgeon: Hester Wolm PARAS, MD;  Location: [facility];  Service: Cardiovascular;  Laterality: N/A;   CATARACT EXTRACTION W/PHACO Right 06/03/2020   Procedure: CATARACT EXTRACTION PHACO AND INTRAOCULAR LENS PLACEMENT (IOC) RIGHT 6.07  00:48.0;  Surgeon: Myrna Adine Anes, MD;  Location: [facility];  Service: Ophthalmology;  Laterality: Right;  sleep apnea   CORONARY ARTERY BYPASS GRAFT     EYE SURGERY     LEG SURGERY Left    stents placed, Fem-Fem bypass   LOWER EXTREMITY ANGIOGRAPHY Left 09/01/2022   Procedure: Lower Extremity Angiography;  Surgeon: Jama Cordella MATSU, MD;  Location: [facility];  Service: Cardiovascular;  Laterality: Left;   RIGHT HEART CATH Right 07/24/2024   Procedure: RIGHT HEART CATH;  Surgeon: Florencio Cara BIRCH, MD;  Location: [facility];  Service: Cardiovascular;  Laterality: Right;   RIGHT/LEFT HEART CATH AND CORONARY ANGIOGRAPHY Bilateral 02/12/2022   Procedure: RIGHT/LEFT HEART CATH AND CORONARY ANGIOGRAPHY;  Surgeon: Hester Wolm PARAS, MD;  Location: [facility] CV [facility];  Service: Cardiovascular;  Laterality: Bilateral;   TONSILLECTOMY       FAMILY HISTORY   Family History  Problem Relation Age of Onset   Thyroid  disease Mother    Macular degeneration Mother    Cancer Father      SOCIAL HISTORY   Social History[1]   MEDICATIONS    Home Medication:    Current Medication: Current Medications[2]    ALLERGIES   Fish allergy, Spironolactone, Hydrocodone , Tramadol , Hydrochlorothiazide, Iodine , and Shellfish allergy     REVIEW OF SYSTEMS    Review of Systems:  Gen:  Denies  fever, sweats, chills weigh loss  HEENT: Denies blurred vision, double vision, ear pain, eye pain, hearing loss, nose bleeds, sore throat Cardiac:  No dizziness, chest pain or heaviness, chest tightness,edema Resp:   reports dyspnea chronically  Gi: Denies swallowing difficulty, stomach pain, nausea or vomiting, diarrhea, constipation, bowel incontinence Gu:  Denies bladder incontinence, burning urine Ext:   Denies Joint pain, stiffness or swelling Skin: Denies  skin rash, easy bruising or bleeding or hives Endoc:  Denies polyuria, polydipsia , polyphagia or weight change Psych:   Denies depression, insomnia or hallucinations   Other:  All other systems negative   VS: BP (!) 145/97 (BP Location: Right Arm)   Pulse 95   Temp 97.6 F (36.4 C) (Oral)   Resp 18   Ht 5' 11 (1.803 m)   Wt 97.6 kg   SpO2 99%   BMI 30.01 kg/m      PHYSICAL EXAM    GENERAL:NAD, no fevers, chills, no weakness no  fatigue HEAD: Normocephalic, atraumatic.  EYES: Pupils equal, round, reactive to light. Extraocular muscles intact. No scleral icterus.  MOUTH: Moist mucosal membrane. Dentition intact. No abscess noted.  EAR, NOSE, THROAT: Clear without exudates. No external lesions.  NECK: Supple. No thyromegaly. No nodules. No JVD.  PULMONARY: decreased breath sounds with mild rhonchi worse at bases bilaterally.  CARDIOVASCULAR: S1 and S2. Regular rate and rhythm. No murmurs, rubs, or gallops. No edema. Pedal pulses 2+ bilaterally.  GASTROINTESTINAL: Soft, nontender, nondistended. No masses. Positive bowel sounds. No hepatosplenomegaly.  MUSCULOSKELETAL: No swelling, clubbing, or edema. Range of motion full in all extremities.  NEUROLOGIC: Cranial nerves II through XII are intact. No gross focal neurological deficits. Sensation intact. Reflexes intact.  SKIN: No ulceration, lesions, rashes, or cyanosis. Skin warm and dry. Turgor intact.  PSYCHIATRIC: Mood, affect within normal limits. The patient is awake, alert and  oriented x 3. Insight, judgment intact.       IMAGING     ASSESSMENT/PLAN   Acute exacerbation of ILD      Patient with hypoxemia with pulmonary fibrosis     - continue methotrexate       - continue IS and Flutter valve     - continue steroids increase to decadron  8 bid       - bactrim  three times per week     - morphine  PRN     Atelectasis at bases bilaterally     - metaneb TID with RT   Acute on chronic systolic CHF exacerbation      Continue diuresis with torsemide     Strict Is&Os            Thank you for allowing me to participate in the care of this patient.   Patient/Family are satisfied with care plan and all questions have been answered.    Provider disclosure: Patient with at least one acute or chronic illness or injury that poses a threat to life or bodily function and is being managed actively during this encounter.  All of the below services have been  performed independently by signing provider:  review of prior documentation from internal and or external health records.  Review of previous and current lab results.  Interview and comprehensive assessment during patient visit today. Review of current and previous chest radiographs/CT scans. Discussion of management and test interpretation with health care team and patient/family.   This document was prepared using Dragon voice recognition software and may include unintentional dictation errors.     Keyshawn Hellwig, M.D.  Division of Pulmonary & Critical Care Medicine                  [1]  Social History Tobacco Use   Smoking status: Former    Current packs/day: 0.00    Types: Cigarettes    Quit date: 02/16/1979    Years since quitting: 45.8   Smokeless tobacco: Never  Vaping Use   Vaping status: Never Used  Substance Use Topics   Alcohol  use: Yes    Comment: rarely   Drug use: No  [2]  Current Facility-Administered Medications:    acetaminophen  (TYLENOL ) tablet 650 mg, 650 mg, Oral, Q6H PRN **OR** acetaminophen  (TYLENOL ) suppository 650 mg, 650 mg, Rectal, Q6H PRN, Cleatus Delayne GAILS, MD   albuterol  (PROVENTIL ) (2.5 MG/3ML) 0.083% nebulizer solution 2.5 mg, 2.5 mg, Nebulization, Q2H PRN, Cleatus Delayne V, MD, 2.5 mg at 12/19/24 1343   allopurinol  (ZYLOPRIM ) tablet 100 mg, 100 mg, Oral, Daily, Cleatus Delayne V, MD, 100 mg at 12/20/24 9192   apixaban  (ELIQUIS ) tablet 5 mg, 5 mg, Oral, BID, Cleatus Delayne V, MD, 5 mg at 12/20/24 2146   atorvastatin  (LIPITOR) tablet 40 mg, 40 mg, Oral, Daily, Cleatus Delayne V, MD, 40 mg at 12/20/24 1606   dexamethasone  (DECADRON ) injection 4 mg, 4 mg, Intravenous, Q12H, Dj Senteno, MD, 4 mg at 12/20/24 2147   diltiazem  (CARDIZEM  CD) 24 hr capsule 240 mg, 240 mg, Oral, Daily, Awanda City, MD, 240 mg at 12/20/24 0950   DULoxetine  (CYMBALTA ) DR capsule 60 mg, 60 mg, Oral, Daily, Cleatus Delayne V, MD, 60 mg at 12/20/24 0807   feeding supplement  (ENSURE PLUS HIGH PROTEIN) liquid 237 mL, 237 mL, Oral, BID BM, Awanda City, MD, 237 mL at 12/19/24 0841   folic acid  (FOLVITE ) tablet 1 mg, 1 mg, Oral, Daily, Awanda City, MD, 1 mg at 12/20/24 [phone]  guaiFENesin -dextromethorphan (ROBITUSSIN DM) 100-10 MG/5ML syrup 5 mL, 5 mL, Oral, Q4H PRN, Cleatus Delayne GAILS, MD   hydrALAZINE  (APRESOLINE ) injection 10 mg, 10 mg, Intravenous, Q6H PRN, Awanda City, MD, 10 mg at 12/08/24 1350   HYDROcodone -acetaminophen  (NORCO/VICODIN) 5-325 MG per tablet 1-2 tablet, 1-2 tablet, Oral, Q4H PRN, Cleatus Delayne GAILS, MD   insulin  aspart (novoLOG ) injection 0-15 Units, 0-15 Units, Subcutaneous, TID WC, Cleatus Delayne GAILS, MD, 3 Units at 12/20/24 1605   insulin  aspart (novoLOG ) injection 0-5 Units, 0-5 Units, Subcutaneous, QHS, Cleatus Delayne GAILS, MD, 2 Units at 12/07/24 2326   levothyroxine  (SYNTHROID ) tablet 25 mcg, 25 mcg, Oral, Q0600, Cleatus Delayne GAILS, MD, 25 mcg at 12/21/24 0602   methotrexate  (RHEUMATREX) tablet 10 mg, 10 mg, Oral, Weekly, Cleatus Delayne V, MD, 10 mg at 12/17/24 0825   morphine  (PF) 2 MG/ML injection 2 mg, 2 mg, Intravenous, Q2H PRN, Cleatus Delayne GAILS, MD   mycophenolate  (CELLCEPT ) capsule 1,000 mg, 1,000 mg, Oral, BID, Dontai Pember, MD, 1,000 mg at 12/20/24 2146   ondansetron  (ZOFRAN ) tablet 4 mg, 4 mg, Oral, Q6H PRN, 4 mg at 12/14/24 0953 **OR** ondansetron  (ZOFRAN ) injection 4 mg, 4 mg, Intravenous, Q6H PRN, Cleatus Delayne GAILS, MD   pantoprazole  (PROTONIX ) injection 40 mg, 40 mg, Intravenous, Q24H, Tory Septer, MD, 40 mg at 12/20/24 9192   sulfamethoxazole -trimethoprim  (BACTRIM ) 400-80 MG per tablet 1 tablet, 1 tablet, Oral, Once per day on Monday Wednesday Friday, Cleatus Delayne GAILS, MD, 1 tablet at 12/20/24 9190   torsemide  (DEMADEX ) tablet 10 mg, 10 mg, Oral, Daily, Franchot Novel, MD, 10 mg at 12/20/24 9192   Vitamin D  (Ergocalciferol ) (DRISDOL ) 1.25 MG (50000 UNIT) capsule 50,000 Units, 50,000 Units, Oral, Q7 days, Franchot Novel, MD, 50,000 Units at 12/14/24  1822  "

## 2025-08-20 ENCOUNTER — Encounter (INDEPENDENT_AMBULATORY_CARE_PROVIDER_SITE_OTHER)

## 2025-08-20 ENCOUNTER — Ambulatory Visit (INDEPENDENT_AMBULATORY_CARE_PROVIDER_SITE_OTHER): Admitting: Vascular Surgery
